# Patient Record
Sex: Female | Born: 1940 | Race: Black or African American | Hispanic: No | Marital: Married | Smoking: Former smoker
Health system: Southern US, Community
[De-identification: ages and names within clinical notes are randomized; demographics above are authoritative.]

## PROBLEM LIST (undated history)

## (undated) DIAGNOSIS — E785 Hyperlipidemia, unspecified: Secondary | ICD-10-CM

## (undated) DIAGNOSIS — C211 Malignant neoplasm of anal canal: Secondary | ICD-10-CM

## (undated) DIAGNOSIS — Z923 Personal history of irradiation: Secondary | ICD-10-CM

## (undated) DIAGNOSIS — I059 Rheumatic mitral valve disease, unspecified: Secondary | ICD-10-CM

## (undated) DIAGNOSIS — Z9289 Personal history of other medical treatment: Secondary | ICD-10-CM

## (undated) DIAGNOSIS — M810 Age-related osteoporosis without current pathological fracture: Secondary | ICD-10-CM

## (undated) DIAGNOSIS — R7309 Other abnormal glucose: Secondary | ICD-10-CM

## (undated) DIAGNOSIS — D649 Anemia, unspecified: Secondary | ICD-10-CM

## (undated) DIAGNOSIS — C541 Malignant neoplasm of endometrium: Secondary | ICD-10-CM

## (undated) DIAGNOSIS — F411 Generalized anxiety disorder: Secondary | ICD-10-CM

## (undated) DIAGNOSIS — I10 Essential (primary) hypertension: Secondary | ICD-10-CM

## (undated) DIAGNOSIS — K739 Chronic hepatitis, unspecified: Secondary | ICD-10-CM

## (undated) HISTORY — PX: TONSILLECTOMY: SUR1361

## (undated) HISTORY — DX: Rheumatic mitral valve disease, unspecified: I05.9

## (undated) HISTORY — DX: Malignant neoplasm of anal canal: C21.1

## (undated) HISTORY — DX: Hyperlipidemia, unspecified: E78.5

## (undated) HISTORY — DX: Anemia, unspecified: D64.9

## (undated) HISTORY — DX: Chronic hepatitis, unspecified: K73.9

## (undated) HISTORY — DX: Other abnormal glucose: R73.09

## (undated) HISTORY — DX: Generalized anxiety disorder: F41.1

## (undated) HISTORY — PX: CHOLECYSTECTOMY: SHX55

## (undated) HISTORY — DX: Malignant neoplasm of endometrium: C54.1

## (undated) HISTORY — DX: Essential (primary) hypertension: I10

## (undated) HISTORY — PX: BREAST LUMPECTOMY: SHX2

## (undated) HISTORY — DX: Personal history of irradiation: Z92.3

---

## 2000-06-10 ENCOUNTER — Emergency Department (HOSPITAL_COMMUNITY): Admission: EM | Admit: 2000-06-10 | Discharge: 2000-06-10 | Payer: Self-pay | Admitting: Emergency Medicine

## 2000-06-10 ENCOUNTER — Encounter: Payer: Self-pay | Admitting: Emergency Medicine

## 2000-09-26 ENCOUNTER — Encounter (INDEPENDENT_AMBULATORY_CARE_PROVIDER_SITE_OTHER): Payer: Self-pay | Admitting: Specialist

## 2000-09-26 ENCOUNTER — Other Ambulatory Visit: Admission: RE | Admit: 2000-09-26 | Discharge: 2000-09-26 | Payer: Self-pay | Admitting: Radiology

## 2000-11-14 ENCOUNTER — Other Ambulatory Visit: Admission: RE | Admit: 2000-11-14 | Discharge: 2000-11-14 | Payer: Self-pay | Admitting: Surgery

## 2001-05-23 ENCOUNTER — Encounter (INDEPENDENT_AMBULATORY_CARE_PROVIDER_SITE_OTHER): Payer: Self-pay | Admitting: Specialist

## 2001-05-23 ENCOUNTER — Other Ambulatory Visit: Admission: RE | Admit: 2001-05-23 | Discharge: 2001-05-23 | Payer: Self-pay | Admitting: Surgery

## 2002-02-03 ENCOUNTER — Encounter: Payer: Self-pay | Admitting: Surgery

## 2002-02-03 ENCOUNTER — Encounter: Admission: RE | Admit: 2002-02-03 | Discharge: 2002-02-03 | Payer: Self-pay | Admitting: Surgery

## 2002-02-04 ENCOUNTER — Ambulatory Visit: Admission: RE | Admit: 2002-02-04 | Discharge: 2002-05-05 | Payer: Self-pay | Admitting: Radiation Oncology

## 2002-03-04 ENCOUNTER — Encounter: Payer: Self-pay | Admitting: Oncology

## 2002-03-04 ENCOUNTER — Ambulatory Visit (HOSPITAL_COMMUNITY): Admission: RE | Admit: 2002-03-04 | Discharge: 2002-03-04 | Payer: Self-pay | Admitting: Oncology

## 2002-03-20 ENCOUNTER — Encounter (HOSPITAL_COMMUNITY): Admission: RE | Admit: 2002-03-20 | Discharge: 2002-06-18 | Payer: Self-pay | Admitting: Oncology

## 2005-01-23 ENCOUNTER — Ambulatory Visit: Payer: Self-pay | Admitting: Pulmonary Disease

## 2005-04-27 ENCOUNTER — Ambulatory Visit: Payer: Self-pay | Admitting: Pulmonary Disease

## 2005-10-30 ENCOUNTER — Ambulatory Visit: Payer: Self-pay | Admitting: Pulmonary Disease

## 2006-01-03 ENCOUNTER — Ambulatory Visit: Payer: Self-pay | Admitting: Pulmonary Disease

## 2006-03-19 ENCOUNTER — Ambulatory Visit: Payer: Self-pay | Admitting: Pulmonary Disease

## 2006-06-19 ENCOUNTER — Ambulatory Visit: Payer: Self-pay | Admitting: Pulmonary Disease

## 2006-07-03 ENCOUNTER — Ambulatory Visit: Payer: Self-pay | Admitting: Gastroenterology

## 2006-08-06 ENCOUNTER — Ambulatory Visit: Payer: Self-pay | Admitting: Gastroenterology

## 2006-12-06 ENCOUNTER — Ambulatory Visit: Payer: Self-pay | Admitting: Pulmonary Disease

## 2006-12-21 ENCOUNTER — Ambulatory Visit: Payer: Self-pay | Admitting: Pulmonary Disease

## 2006-12-21 LAB — CONVERTED CEMR LAB
ALT: 23 units/L (ref 0–40)
AST: 33 units/L (ref 0–37)
Albumin: 3.4 g/dL — ABNORMAL LOW (ref 3.5–5.2)
Alkaline Phosphatase: 57 units/L (ref 39–117)
BUN: 14 mg/dL (ref 6–23)
Basophils Absolute: 0 10*3/uL (ref 0.0–0.1)
Basophils Relative: 0.5 % (ref 0.0–1.0)
Bilirubin, Direct: 0.1 mg/dL (ref 0.0–0.3)
CO2: 31 meq/L (ref 19–32)
Calcium: 9 mg/dL (ref 8.4–10.5)
Chloride: 106 meq/L (ref 96–112)
Cholesterol: 168 mg/dL (ref 0–200)
Creatinine, Ser: 0.8 mg/dL (ref 0.4–1.2)
Eosinophils Absolute: 0.1 10*3/uL (ref 0.0–0.6)
Eosinophils Relative: 3.4 % (ref 0.0–5.0)
GFR calc Af Amer: 93 mL/min
GFR calc non Af Amer: 77 mL/min
Glucose, Bld: 99 mg/dL (ref 70–99)
HCT: 34.6 % — ABNORMAL LOW (ref 36.0–46.0)
HDL: 56.4 mg/dL (ref >39.0)
Hemoglobin: 11.9 g/dL — ABNORMAL LOW (ref 12.0–15.0)
Hgb A1c MFr Bld: 5.6 % (ref 4.6–6.0)
LDL Cholesterol: 97 mg/dL (ref 0–99)
Lymphocytes Relative: 49.1 % — ABNORMAL HIGH (ref 12.0–46.0)
MCHC: 34.3 g/dL (ref 30.0–36.0)
MCV: 90.6 fL (ref 78.0–100.0)
Monocytes Absolute: 0.4 10*3/uL (ref 0.2–0.7)
Monocytes Relative: 10.5 % (ref 3.0–11.0)
Neutro Abs: 1.4 10*3/uL (ref 1.4–7.7)
Neutrophils Relative %: 36.5 % — ABNORMAL LOW (ref 43.0–77.0)
Platelets: 206 10*3/uL (ref 150–400)
Potassium: 3.6 meq/L (ref 3.5–5.1)
RBC: 3.82 M/uL — ABNORMAL LOW (ref 3.87–5.11)
RDW: 11.8 % (ref 11.5–14.6)
Sodium: 141 meq/L (ref 135–145)
TSH: 1.92 microintl units/mL (ref 0.35–5.50)
Total Bilirubin: 1 mg/dL (ref 0.3–1.2)
Total CHOL/HDL Ratio: 3
Total Protein: 7.1 g/dL (ref 6.0–8.3)
Triglycerides: 73 mg/dL (ref 0–149)
VLDL: 15 mg/dL (ref 0–40)
WBC: 3.9 10*3/uL — ABNORMAL LOW (ref 4.5–10.5)

## 2007-07-03 ENCOUNTER — Ambulatory Visit: Payer: Self-pay | Admitting: Pulmonary Disease

## 2007-07-03 LAB — CONVERTED CEMR LAB
ALT: 27 units/L (ref 0–35)
AST: 40 units/L — ABNORMAL HIGH (ref 0–37)
Albumin: 3.7 g/dL (ref 3.5–5.2)
Alkaline Phosphatase: 58 units/L (ref 39–117)
BUN: 11 mg/dL (ref 6–23)
Basophils Absolute: 0 10*3/uL (ref 0.0–0.1)
Basophils Relative: 0.3 % (ref 0.0–1.0)
Bilirubin, Direct: 0.1 mg/dL (ref 0.0–0.3)
CO2: 32 meq/L (ref 19–32)
Calcium: 9.3 mg/dL (ref 8.4–10.5)
Chloride: 107 meq/L (ref 96–112)
Creatinine, Ser: 0.8 mg/dL (ref 0.4–1.2)
Eosinophils Absolute: 0.1 10*3/uL (ref 0.0–0.6)
Eosinophils Relative: 3.2 % (ref 0.0–5.0)
GFR calc Af Amer: 93 mL/min
GFR calc non Af Amer: 77 mL/min
Glucose, Bld: 112 mg/dL — ABNORMAL HIGH (ref 70–99)
HCT: 34.4 % — ABNORMAL LOW (ref 36.0–46.0)
Hemoglobin: 11.8 g/dL — ABNORMAL LOW (ref 12.0–15.0)
Lymphocytes Relative: 35.6 % (ref 12.0–46.0)
MCHC: 34.4 g/dL (ref 30.0–36.0)
MCV: 89.9 fL (ref 78.0–100.0)
Monocytes Absolute: 0.3 10*3/uL (ref 0.2–0.7)
Monocytes Relative: 9.7 % (ref 3.0–11.0)
Neutro Abs: 1.7 10*3/uL (ref 1.4–7.7)
Neutrophils Relative %: 51.2 % (ref 43.0–77.0)
Platelets: 196 10*3/uL (ref 150–400)
Potassium: 4.6 meq/L (ref 3.5–5.1)
RBC: 3.82 M/uL — ABNORMAL LOW (ref 3.87–5.11)
RDW: 12.1 % (ref 11.5–14.6)
Sodium: 144 meq/L (ref 135–145)
Total Bilirubin: 0.7 mg/dL (ref 0.3–1.2)
Total Protein: 7.6 g/dL (ref 6.0–8.3)
WBC: 3.2 10*3/uL — ABNORMAL LOW (ref 4.5–10.5)

## 2007-10-23 DIAGNOSIS — F411 Generalized anxiety disorder: Secondary | ICD-10-CM | POA: Insufficient documentation

## 2007-10-23 DIAGNOSIS — I10 Essential (primary) hypertension: Secondary | ICD-10-CM | POA: Insufficient documentation

## 2007-10-23 DIAGNOSIS — I059 Rheumatic mitral valve disease, unspecified: Secondary | ICD-10-CM | POA: Insufficient documentation

## 2007-10-23 DIAGNOSIS — R002 Palpitations: Secondary | ICD-10-CM

## 2007-12-30 ENCOUNTER — Ambulatory Visit: Payer: Self-pay | Admitting: Pulmonary Disease

## 2007-12-30 DIAGNOSIS — M949 Disorder of cartilage, unspecified: Secondary | ICD-10-CM

## 2007-12-30 DIAGNOSIS — R7309 Other abnormal glucose: Secondary | ICD-10-CM | POA: Insufficient documentation

## 2007-12-30 DIAGNOSIS — M899 Disorder of bone, unspecified: Secondary | ICD-10-CM | POA: Insufficient documentation

## 2007-12-30 DIAGNOSIS — E785 Hyperlipidemia, unspecified: Secondary | ICD-10-CM

## 2007-12-30 DIAGNOSIS — K739 Chronic hepatitis, unspecified: Secondary | ICD-10-CM

## 2008-01-19 DIAGNOSIS — C211 Malignant neoplasm of anal canal: Secondary | ICD-10-CM

## 2008-01-19 LAB — CONVERTED CEMR LAB
ALT: 27 units/L (ref 0–35)
AST: 43 units/L — ABNORMAL HIGH (ref 0–37)
Albumin: 3.9 g/dL (ref 3.5–5.2)
Alkaline Phosphatase: 60 units/L (ref 39–117)
BUN: 11 mg/dL (ref 6–23)
Basophils Absolute: 0 10*3/uL (ref 0.0–0.1)
Basophils Relative: 0.4 % (ref 0.0–1.0)
Bilirubin, Direct: 0.1 mg/dL (ref 0.0–0.3)
CO2: 32 meq/L (ref 19–32)
Calcium: 9.5 mg/dL (ref 8.4–10.5)
Chloride: 106 meq/L (ref 96–112)
Cholesterol: 210 mg/dL (ref 0–200)
Creatinine, Ser: 0.9 mg/dL (ref 0.4–1.2)
Direct LDL: 105.4 mg/dL
Eosinophils Absolute: 0.1 10*3/uL (ref 0.0–0.6)
Eosinophils Relative: 2.8 % (ref 0.0–5.0)
GFR calc Af Amer: 81 mL/min
GFR calc non Af Amer: 67 mL/min
Glucose, Bld: 122 mg/dL — ABNORMAL HIGH (ref 70–99)
HCT: 38.9 % (ref 36.0–46.0)
HDL: 61.6 mg/dL (ref >39.0)
Hemoglobin: 12.7 g/dL (ref 12.0–15.0)
Hgb A1c MFr Bld: 5.6 % (ref 4.6–6.0)
Lymphocytes Relative: 32.7 % (ref 12.0–46.0)
MCHC: 32.7 g/dL (ref 30.0–36.0)
MCV: 91.9 fL (ref 78.0–100.0)
Monocytes Absolute: 0.4 10*3/uL (ref 0.2–0.7)
Monocytes Relative: 9.8 % (ref 3.0–11.0)
Neutro Abs: 1.9 10*3/uL (ref 1.4–7.7)
Neutrophils Relative %: 54.3 % (ref 43.0–77.0)
Platelets: 227 10*3/uL (ref 150–400)
Potassium: 4.4 meq/L (ref 3.5–5.1)
RBC: 4.23 M/uL (ref 3.87–5.11)
RDW: 11.8 % (ref 11.5–14.6)
Sodium: 142 meq/L (ref 135–145)
TSH: 1.61 microintl units/mL (ref 0.35–5.50)
Total Bilirubin: 0.8 mg/dL (ref 0.3–1.2)
Total CHOL/HDL Ratio: 3.4
Total Protein: 8.2 g/dL (ref 6.0–8.3)
Triglycerides: 148 mg/dL (ref 0–149)
VLDL: 30 mg/dL (ref 0–40)
WBC: 3.6 10*3/uL — ABNORMAL LOW (ref 4.5–10.5)

## 2008-01-22 ENCOUNTER — Telehealth: Payer: Self-pay | Admitting: Pulmonary Disease

## 2008-02-03 ENCOUNTER — Encounter: Payer: Self-pay | Admitting: Pulmonary Disease

## 2008-07-01 ENCOUNTER — Ambulatory Visit: Payer: Self-pay | Admitting: Pulmonary Disease

## 2008-07-06 LAB — CONVERTED CEMR LAB
ALT: 29 units/L (ref 0–35)
AST: 42 units/L — ABNORMAL HIGH (ref 0–37)
Albumin: 3.8 g/dL (ref 3.5–5.2)
Alkaline Phosphatase: 55 units/L (ref 39–117)
BUN: 15 mg/dL (ref 6–23)
Basophils Absolute: 0 10*3/uL (ref 0.0–0.1)
Basophils Relative: 0.6 % (ref 0.0–3.0)
Bilirubin, Direct: 0.2 mg/dL (ref 0.0–0.3)
CO2: 29 meq/L (ref 19–32)
Calcium: 9.1 mg/dL (ref 8.4–10.5)
Chloride: 113 meq/L — ABNORMAL HIGH (ref 96–112)
Cholesterol: 169 mg/dL (ref 0–200)
Creatinine, Ser: 0.8 mg/dL (ref 0.4–1.2)
Eosinophils Absolute: 0.1 10*3/uL (ref 0.0–0.7)
Eosinophils Relative: 2.2 % (ref 0.0–5.0)
GFR calc Af Amer: 92 mL/min
GFR calc non Af Amer: 76 mL/min
Glucose, Bld: 105 mg/dL — ABNORMAL HIGH (ref 70–99)
HCT: 35.8 % — ABNORMAL LOW (ref 36.0–46.0)
HDL: 67.5 mg/dL (ref >39.0)
Hemoglobin: 12.4 g/dL (ref 12.0–15.0)
LDL Cholesterol: 79 mg/dL (ref 0–99)
Lymphocytes Relative: 33.9 % (ref 12.0–46.0)
MCHC: 34.8 g/dL (ref 30.0–36.0)
MCV: 90.1 fL (ref 78.0–100.0)
Monocytes Absolute: 0.4 10*3/uL (ref 0.1–1.0)
Monocytes Relative: 9.9 % (ref 3.0–12.0)
Neutro Abs: 2.3 10*3/uL (ref 1.4–7.7)
Neutrophils Relative %: 53.4 % (ref 43.0–77.0)
Platelets: 206 10*3/uL (ref 150–400)
Potassium: 4 meq/L (ref 3.5–5.1)
RBC: 3.97 M/uL (ref 3.87–5.11)
RDW: 12.2 % (ref 11.5–14.6)
Sodium: 144 meq/L (ref 135–145)
TSH: 1.01 microintl units/mL (ref 0.35–5.50)
Total Bilirubin: 0.7 mg/dL (ref 0.3–1.2)
Total CHOL/HDL Ratio: 2.5
Total Protein: 7.9 g/dL (ref 6.0–8.3)
Triglycerides: 115 mg/dL (ref 0–149)
VLDL: 23 mg/dL (ref 0–40)
WBC: 4.2 10*3/uL — ABNORMAL LOW (ref 4.5–10.5)

## 2008-07-13 ENCOUNTER — Encounter: Payer: Self-pay | Admitting: Pulmonary Disease

## 2008-12-29 ENCOUNTER — Ambulatory Visit: Payer: Self-pay | Admitting: Pulmonary Disease

## 2008-12-31 DIAGNOSIS — D649 Anemia, unspecified: Secondary | ICD-10-CM

## 2008-12-31 LAB — CONVERTED CEMR LAB
ALT: 24 units/L (ref 0–35)
BUN: 9 mg/dL (ref 6–23)
Basophils Absolute: 0 10*3/uL (ref 0.0–0.1)
Bilirubin, Direct: 0.1 mg/dL (ref 0.0–0.3)
CO2: 30 meq/L (ref 19–32)
Chloride: 107 meq/L (ref 96–112)
Cholesterol: 175 mg/dL (ref 0–200)
Creatinine, Ser: 0.8 mg/dL (ref 0.4–1.2)
Eosinophils Absolute: 0.1 10*3/uL (ref 0.0–0.7)
Eosinophils Relative: 2.3 % (ref 0.0–5.0)
Glucose, Bld: 131 mg/dL — ABNORMAL HIGH (ref 70–99)
HCT: 35.8 % — ABNORMAL LOW (ref 36.0–46.0)
Hgb A1c MFr Bld: 5.6 % (ref 4.6–6.5)
LDL Cholesterol: 102 mg/dL — ABNORMAL HIGH (ref 0–99)
Lymphs Abs: 0.9 10*3/uL (ref 0.7–4.0)
MCHC: 33.5 g/dL (ref 30.0–36.0)
MCV: 91.5 fL (ref 78.0–100.0)
Monocytes Absolute: 0.3 10*3/uL (ref 0.1–1.0)
Neutrophils Relative %: 55.2 % (ref 43.0–77.0)
Platelets: 189 10*3/uL (ref 150.0–400.0)
Potassium: 4 meq/L (ref 3.5–5.1)
RDW: 12.1 % (ref 11.5–14.6)
Sed Rate: 12 mm/hr (ref 0–22)
TSH: 1.67 microintl units/mL (ref 0.35–5.50)
Total Bilirubin: 0.8 mg/dL (ref 0.3–1.2)
Triglycerides: 91 mg/dL (ref 0.0–149.0)

## 2009-01-20 ENCOUNTER — Ambulatory Visit: Payer: Self-pay | Admitting: Pulmonary Disease

## 2009-01-20 LAB — CONVERTED CEMR LAB
Fecal Occult Blood: NEGATIVE
OCCULT 2: NEGATIVE

## 2009-06-30 ENCOUNTER — Ambulatory Visit: Payer: Self-pay | Admitting: Pulmonary Disease

## 2009-07-02 LAB — CONVERTED CEMR LAB
Albumin: 3.9 g/dL (ref 3.5–5.2)
Basophils Absolute: 0 10*3/uL (ref 0.0–0.1)
Basophils Relative: 0.7 % (ref 0.0–3.0)
CO2: 30 meq/L (ref 19–32)
Chloride: 106 meq/L (ref 96–112)
Creatinine, Ser: 0.8 mg/dL (ref 0.4–1.2)
Eosinophils Absolute: 0.1 10*3/uL (ref 0.0–0.7)
Glucose, Bld: 113 mg/dL — ABNORMAL HIGH (ref 70–99)
Hemoglobin: 13.2 g/dL (ref 12.0–15.0)
MCHC: 34.4 g/dL (ref 30.0–36.0)
MCV: 91.7 fL (ref 78.0–100.0)
Monocytes Absolute: 0.4 10*3/uL (ref 0.1–1.0)
Neutro Abs: 1.9 10*3/uL (ref 1.4–7.7)
RBC: 4.2 M/uL (ref 3.87–5.11)
RDW: 12.2 % (ref 11.5–14.6)
Total Protein: 8.9 g/dL — ABNORMAL HIGH (ref 6.0–8.3)

## 2009-07-14 ENCOUNTER — Encounter: Payer: Self-pay | Admitting: Pulmonary Disease

## 2009-09-18 ENCOUNTER — Encounter: Payer: Self-pay | Admitting: Pulmonary Disease

## 2009-09-27 ENCOUNTER — Telehealth (INDEPENDENT_AMBULATORY_CARE_PROVIDER_SITE_OTHER): Payer: Self-pay | Admitting: *Deleted

## 2009-12-27 ENCOUNTER — Ambulatory Visit: Payer: Self-pay | Admitting: Pulmonary Disease

## 2010-01-02 LAB — CONVERTED CEMR LAB
Albumin: 3.9 g/dL (ref 3.5–5.2)
Alkaline Phosphatase: 48 units/L (ref 39–117)
BUN: 12 mg/dL (ref 6–23)
Basophils Absolute: 0 10*3/uL (ref 0.0–0.1)
CO2: 33 meq/L — ABNORMAL HIGH (ref 19–32)
Calcium: 9.3 mg/dL (ref 8.4–10.5)
Cholesterol: 195 mg/dL (ref 0–200)
Creatinine, Ser: 0.8 mg/dL (ref 0.4–1.2)
Glucose, Bld: 116 mg/dL — ABNORMAL HIGH (ref 70–99)
HDL: 77.3 mg/dL (ref >39.00)
Hemoglobin: 12.6 g/dL (ref 12.0–15.0)
Lymphocytes Relative: 36.9 % (ref 12.0–46.0)
Monocytes Relative: 10.1 % (ref 3.0–12.0)
Neutro Abs: 1.7 10*3/uL (ref 1.4–7.7)
RBC: 4.09 M/uL (ref 3.87–5.11)
RDW: 12.1 % (ref 11.5–14.6)
Total Protein: 8 g/dL (ref 6.0–8.3)
Triglycerides: 116 mg/dL (ref 0.0–149.0)
WBC: 3.4 10*3/uL — ABNORMAL LOW (ref 4.5–10.5)

## 2010-03-17 ENCOUNTER — Telehealth: Payer: Self-pay | Admitting: Pulmonary Disease

## 2010-03-17 ENCOUNTER — Encounter: Payer: Self-pay | Admitting: Pulmonary Disease

## 2010-06-21 ENCOUNTER — Encounter: Payer: Self-pay | Admitting: Pulmonary Disease

## 2010-06-27 ENCOUNTER — Ambulatory Visit: Payer: Self-pay | Admitting: Pulmonary Disease

## 2010-07-21 ENCOUNTER — Encounter: Payer: Self-pay | Admitting: Pulmonary Disease

## 2010-07-26 ENCOUNTER — Encounter: Payer: Self-pay | Admitting: Pulmonary Disease

## 2010-07-29 ENCOUNTER — Encounter: Payer: Self-pay | Admitting: Pulmonary Disease

## 2010-08-13 ENCOUNTER — Encounter: Payer: Self-pay | Admitting: Pulmonary Disease

## 2010-11-15 NOTE — Assessment & Plan Note (Signed)
Summary: 6 months/apc   CC:  6 month ROV & review of mult medical problems....  History of Present Illness: 70 y/o BF here for a follow up visit... she has mult med problems as noted below...     ~  Sep10:  25mo follow up- doing well... she's been walking, dieting, & has lost 5# to 191#...  BP has been OK at home= 130-140's/ 80's... feeling well and denies abd pain, n/v, change in bowel habits, etc...    ~  December 27, 2009:  she's had a good 25mo- no new complaints or concerns... retired now & exercising by walking etc... 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   Current Problems:   HYPERTENSION (ICD-401.9) - controlled on TENORMIN 50mg /d, LASIX 20mg /d & K20/d... BP=132/76 today, & not checking regularly at home... she denies HA, fatigue, visual changes, CP, palipit, dizziness, syncope, dyspnea, edema, etc...  ? of MITRAL VALVE PROLAPSE (ICD-424.0) - asymptomatic: no CP or palpit recently...  ~  2DEcho 1/96 showed WNL- no MVP seen, norm LVF w/ EF=55-60%...  PALPITATIONS (ICD-785.1) - no recent symptoms (controlled w/ meds)...  HYPERLIPIDEMIA, BORDERLINE (ICD-272.4) - on diet alone + Fish Oil...  ~  FLP 3/08 showed TChol 168, TG 73, HDL 56, LDL 97... weight 193#, continue diet Rx...  ~  FLP 3/09 showed TChol 210, TG 148, HDL 62, LDL 105... weight 196#, needs better diet efforts.  ~  FLP 9/09 showed TChol 169, TG 115, HDL 68, LDL 79... wt=195#.Marland KitchenMarland Kitchen contin diet efforts.  ~  FLP 3/10 showed TChol 175, TG 91, HDL 55, LDL 102... wt=196#... rec- wt loss.  ~  FLP 3/11 showed TChol 195, TG 116, HDL 77, LDL 95... wt=191#  Hx of OTHER ABNORMAL GLUCOSE (ICD-790.29) - borderline blood sugars in the past- diet Rx...  ~  labs 3/08 showed BS=99, HgA1c=5.6  ~  labs 3/09 showed BS=122, HgA1c=5.6.Marland KitchenMarland Kitchen rec diet + exercise program...  ~  labs 9/09 showed BS= 105  ~  labs 3/10 showed BS=  131, HgA1c= 5.6.Marland KitchenMarland Kitchen rec- contin diet efforts, get wt down.  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  ~  last colonoscopy 10/07 by DrPatterson was normal w/o rectal lesions seen, & otherw neg too... f/u planned 72yrs...  ~  3/10: pt reports no blood seen, stool cards= neg...  ~  3/11:  remains stable & asymptomatic...  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 she had a needle bx of liver in 1998 showing granulomatous hepatitis & ACE was 74 (8-52) and she was treated w/ Pred w/ normalization of her LFT's (and ACE level)... we were unable to wean to Qod regimen due to incr LFT's again, but stable on daily Rx- now down to 2.5mg /d.  ~  prev LFT's normal 3/08 on Pred 5mg /d... continue same med...  ~  labs 3/09 w/ SGOT 43 (0-37), all others WNL.Marland Kitchen. continue 5mg /d...  ~  labs 9/09 showed SGOT= 42, others normal... rec- decr 5mg  to 1/2 tab daily...  ~  labs 3/10 showed SGOT= 38, others WNL.Marland Kitchen. rec- contin same (2.5mg /d)  ~  labs 9/10 showed SGOT= 40, SGPT= 28  ~  labs 3/11 showed SGOT= 39, SGPT= 25... rec> contin Pred 2.5mg /d  OSTEOPENIA (ICD-733.90) - on EVISTA 60mg /d, ca++, vits, & Vit D 50000 u weekly per DrDickstein...  ~  last BMD @ [facility] 9/05 showed TScores -0.7 to -1.1  ~  pt reports BMD and Vit D levels by DrDickstein...  ANXIETY (ICD-300.00) - on TRANXENE 7.5mg  as needed.  ANEMIA (ICD-285.9) - prev hx of anemia... stabilized in the 11-12 range...   ~  labs 3/10 showed Hg= 12.0  ~  labs 9/10 showed Hg= 13.2  ~  labs 3/11 showed Hg= 12.6  Health Maintenance -   ~  GYN/ PAP per DrDickstein... pt reports last seen 9/10  ~  Mammograms at [facility]... done 2/10 & reported neg.  ~  Colonoscopy last performed  7/07 by DrPatterson (see above).  ~  Immunizations: she gets yearly Flu shots (given 12/10);  had TETANUS shot  ~2007-8 she says;  given PNEUMOVAX today 3/11- age69.   Allergies (verified): No Known Drug Allergies  Past History:  Past Medical History:  HYPERTENSION (ICD-401.9) MITRAL VALVE PROLAPSE (ICD-424.0) PALPITATIONS (ICD-785.1) HYPERLIPIDEMIA, BORDERLINE (ICD-272.4) Hx of OTHER ABNORMAL GLUCOSE (ICD-790.29) Hx of MALIGNANT NEOPLASM OF ANAL CANAL (ICD-154.2) UNSPECIFIED CHRONIC HEPATITIS (ICD-571.40) OSTEOPENIA (ICD-733.90) ANXIETY (ICD-300.00) Hx of ANEMIA (ICD-285.9)  Past Surgical History: S/P cholecystectomy in 1997 by DrBallen  Family History: Reviewed history from 06/30/2009 and no changes required. Father died  ~65 w/ stroke, MVA Mother died age 25 w/ Alzheimer's disease 12 Siblings: 3 Brothers drowned in the 49's 1 Brother died w/ prostate cancer 1 Sister died w/ Alzheimer's dis 6 Sibs alive & well  Social History: Reviewed history from 06/30/2009 and no changes required. Married, husb= Joe [place], 28yrs. 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    Vital Signs:  Patient profile:   70 year old female Height:      65 inches Weight:      191 pounds BMI:     31.90 O2 Sat:       94 % on Room air Temp:     97.7 degrees F oral Pulse rate:   78 / minute BP sitting:   132 / 76  (left arm) Cuff size:   regular  Vitals Entered By: Randell Loop CMA (December 27, 2009 9:41 AM)  O2 Sat at Rest %:  94 O2 Flow:  Room air CC: 6 month ROV & review of mult medical problems... Is Patient Diabetic? No Pain Assessment Patient in pain? no      Comments MEDS UPDATED TODAY   Physical Exam  Additional Exam:  WD, Sl overweight, 69 y/o BF in NAD... GENERAL:  Alert & oriented; pleasant & cooperative... HEENT:  [place]/AT, EOM-wnl, PERRLA, EACs-clear, TMs-wnl, NOSE-clear, THROAT-clear & wnl. NECK:  Supple w/ fairROM; no JVD; normal carotid impulses w/o bruits; no thyromegaly or nodules palpated; no lymphadenopathy. CHEST:  Clear to P &  A; without wheezes/ rales/ or rhonchi. HEART:  Regular Rhythm; without murmurs/ rubs/ or gallops. ABDOMEN:  Soft & nontender; normal bowel sounds; no organomegaly or masses detected. EXT: without deformities or arthritic changes; no varicose veins/ venous insuffic/ or edema. NEURO:  CN's intact; motor testing normal; sensory testing normal; gait normal & balance OK. DERM:  No lesions noted; no rash etc...    MISC. Report  Procedure date:  12/27/2009  Findings:      FASTING labs reviewed & pt notified by phone tree...  SN    Impression & Recommendations:  Problem # 1:  HYPERTENSION (ICD-401.9) Controlled-  same meds. Her updated medication list for this problem includes:    Tenormin 50 Mg Tabs (Atenolol) .Marland Kitchen... Take 1 tablet by mouth once a day    Lasix 20 Mg Tabs (Furosemide) .Marland Kitchen... Take 1 tablet by mouth once a day  Orders: TLB-Lipid Panel (80061-LIPID) TLB-BMP (Basic Metabolic Panel-BMET) (80048-METABOL) TLB-Hepatic/Liver Function Pnl (80076-HEPATIC) TLB-CBC Platelet - w/Differential (85025-CBCD) TLB-TSH (Thyroid Stimulating Hormone) (84443-TSH) TLB-A1C / Hgb A1C (Glycohemoglobin) (83036-A1C)  Problem # 2:  HYPERLIPIDEMIA,  BORDERLINE (ICD-272.4) Improved-  stable on diet + fish oil...  Problem # 3:  Hx of OTHER ABNORMAL GLUCOSE (ICD-790.29) She remains under control w/ diet + exerciose... get wt down!!!  Problem # 4:  Hx of MALIGNANT NEOPLASM OF ANAL CANAL (ICD-154.2) Followed by DrStreck, Duwayne Heck, DrDickstein... no known recurrence- doing satis.  Problem # 5:  UNSPECIFIED CHRONIC HEPATITIS (ICD-571.40) Hx granulomatous hepatitis... stable on Pred 2.5mg /d... keep same.  Problem # 6:  OSTEOPENIA (ICD-733.90) Stable on Evista, Calcium, Vits, Vit D 1000 u daily. Her updated medication list for this problem includes:    Evista 60 Mg Tabs (Raloxifene hcl) .Marland Kitchen... Take 1 tablet by mouth once a day as directed by drdickstein.  Problem # 7:  OTHER MEDICAL PROBLEMS AS NOTED>>> OK PNEUMOVAX today...  Complete Medication List: 1)  Tenormin 50 Mg Tabs (Atenolol) .... Take 1 tablet by mouth once a day 2)  Lasix 20 Mg Tabs (Furosemide) .... Take 1 tablet by mouth once a day 3)  Klor-con M20 20 Meq Tbcr (Potassium chloride crys cr) .... Take 1 tablet by mouth once a day 4)  Fish Oil 1000 Mg Caps (Omega-3 fatty acids) .Marland Kitchen.. 1 cap by mouth once daily.Marland KitchenMarland Kitchen 5)  Prednisone 5 Mg Tabs (Prednisone) .... Take as directed 6)  Evista 60 Mg Tabs (Raloxifene hcl) .... Take 1 tablet by mouth once a day as directed by drdickstein. 7)  Tums 500 Mg Chew (Calcium carbonate antacid) .... Take 2 two times a day 8)  Centrum Silver Tabs (Multiple vitamins-minerals) .... Take 1 tablet by mouth once a day 9)  Vitamin D 1000 Unit Tabs (Cholecalciferol) .... Take 1 tablet by mouth once a day 10)  Garlic Oil 1000 Mg Caps (Garlic) .... Take one capsule by mouth once daily 11)  Clorazepate Dipotassium 7.5 Mg Tabs (Clorazepate dipotassium) .... Take 1/2 to 1 tab by mouth three times a day as needed for anxiety...  Other Orders: Pneumococcal Vaccine (04540) Admin 1st Vaccine (98119)  Patient Instructions: 1)  Today we updated your med list- see  below.... 2)  Continue your current medications the same... 3)  Today we did your follow up FASTING blood work... please call the "phone tree" in a few days for your lab results.Marland KitchenMarland Kitchen 4)  We also gave your the PNEUMONIA vaccine today (based on current recommendations this is the only one you will need).Marland Kitchen 5)  Keep up the great  exercise program and diet Rx... 6)  Call for any questions.Marland KitchenMarland Kitchen 7)  Please schedule a follow-up appointment in 6 months.   Immunizations Administered:  Pneumonia Vaccine:    Vaccine Type: Pneumovax    Site: left deltoid    Mfr: Merck    Dose: 0.5 ml    Route: IM    Given by: Randell Loop CMA    Exp. Date: 05/30/2011    Lot #: [id number]    VIS given: 05/13/96 version given December 27, 2009.

## 2010-11-15 NOTE — Medication Information (Signed)
Summary: Furosemide,Atenolol,Klor-Con/[employer]  Furosemide,Atenolol,Klor-Con/[employer]   Imported By: Sherian Rein 06/27/2010 08:18:52  _____________________________________________________________________  External Attachment:    Type:   Image     Comment:   External Document

## 2010-11-15 NOTE — Assessment & Plan Note (Signed)
Summary: rov 6 months///kp   CC:  6 month ROV & review of mult medical problems....  History of Present Illness: 70 y/o Holt here for a follow up visit... she has mult med problems as noted below...     ~  Sep10:  60mo follow up- doing well... she's been walking, dieting, & has lost 5# to 191#...  BP has been OK at home= 130-140's/ 80's... feeling well and denies abd pain, n/v, change in bowel habits, etc...    ~  December 27, 2009:  she's had a good 60mo- no new complaints or concerns... retired now & exercising by walking etc... 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   ~  June 27, 2010:  60mo ROV- doing well w/o new complaints or concerns... BP stable on Aten+Lasix;  denies CP, palpit, SOB, edema, etc;  Chol & Sugars have been OK on diet Rx;  weight stable in 190's;  GI reviewed & stable> on Pred 2.5mg /d w/ LFTs checked;  OK 2011 Flu vaccine today & she inquired about taking a B12 supplement in addition to her MVI & Vit D...   Current Problems:   HYPERTENSION (ICD-401.9) - controlled on TENORMIN 50mg /d, LASIX 20mg /d & K20/d... BP=138/70 today, & not checking regularly at home... she denies HA, fatigue, visual changes, CP, palipit, dizziness, syncope, dyspnea, edema, etc...  ? of MITRAL VALVE PROLAPSE (ICD-424.0) - asymptomatic: no CP or palpit recently...  ~  2DEcho 1/96 showed WNL- no MVP seen, norm LVF w/ EF=55-60%...  PALPITATIONS (ICD-785.1) - no recent symptoms (controlled w/ meds)...  HYPERLIPIDEMIA, BORDERLINE (ICD-272.4) - on diet alone + Fish Oil...  ~  FLP 3/08 showed TChol 168, TG 73, HDL 56, LDL [place]... weight 193#, continue diet Rx...  ~  FLP 3/09 showed TChol 210, TG 148, HDL 62, LDL 105... weight 196#, needs better diet efforts.  ~  FLP 9/09 showed TChol 169, TG 115, HDL 68, LDL 79... wt=195#.Marland KitchenMarland Kitchen contin diet efforts.  ~  FLP 3/10 showed TChol 175, TG 91,  HDL 55, LDL 102... wt=196#... rec- wt loss.  ~  FLP 3/11 showed TChol 195, TG 116, HDL 77, LDL 95... wt=191#  Hx of OTHER ABNORMAL GLUCOSE (ICD-790.29) - borderline blood sugars in the past- diet Rx...  ~  labs 3/08 showed BS=99, HgA1c=5.6  ~  labs 3/09 showed BS=122, HgA1c=5.6.Marland KitchenMarland Kitchen rec diet + exercise program...  ~  labs 9/09 showed BS= 105  ~  labs 3/10 showed BS= 131, HgA1c= 5.6.Marland KitchenMarland Kitchen rec- contin diet efforts, get wt down.  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  ~  last colonoscopy 10/07 by DrPatterson was normal w/o rectal lesions seen, & otherw neg too... f/u planned 71yrs...  ~  3/10: pt reports no blood seen, stool cards= neg...  ~  3/11:  remains stable & asymptomatic...  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 she had a needle bx of liver in 1998 showing granulomatous hepatitis & ACE was 74 (8-52) and she was treated w/ Pred w/ normalization of her LFT's (and ACE level)... we were unable to wean  to Qod regimen due to incr LFT's again, but stable on daily Rx- now down to 2.5mg /d.  ~  prev LFT's normal 3/08 on Pred 5mg /d... continue same med...  ~  labs 3/09 w/ SGOT 43 (0-37), all others WNL.Marland Kitchen. continue 5mg /d...  ~  labs 9/09 showed SGOT= 42, others normal... rec- decr 5mg  to 1/2 tab daily...  ~  labs 3/10 showed SGOT= 38, others WNL.Marland Kitchen. rec- contin same (2.5mg /d)  ~  labs 9/10 showed SGOT= 40, SGPT= 28  ~  labs 3/11 showed SGOT= 39, SGPT= 25... rec> contin Pred 2.5mg /d  OSTEOPENIA (ICD-733.90) - on EVISTA 60mg /d, ca++, vits, & Vit D 2000 u daily per DrDickstein...  ~  last BMD @ [facility] 9/05 showed TScores -0.7 to -1.1  ~  pt reports BMD and Vit D levels by DrDickstein & decr from 50K per wk to 2000u  per day.  ANXIETY (ICD-300.00) - on TRANXENE 7.5mg  as needed.  ANEMIA (ICD-285.9) - prev hx of anemia... stabilized in the 11-12 range...   ~  labs 3/10 showed Hg= 12.0  ~  labs 9/10 showed Hg= 13.2  ~  labs 3/11 showed Hg= 12.6  Health Maintenance -   ~  GYN/ PAP per DrDickstein... pt reports last seen 9/10  ~  Mammograms at [facility]... done 2/10 & reported neg.  ~  Colonoscopy last performed 7/07 by DrPatterson (see above).  ~  Immunizations: she gets yearly Flu shots (given 9/11);  had TETANUS shot  ~2007-8 she says;  given PNEUMOVAX today 3/11- age69.   Preventive Screening-Counseling & Management  Alcohol-Tobacco     Smoking Status: never  Allergies (verified): No Known Drug Allergies  Comments:  Nurse/Medical Assistant: The patient's medications and allergies were reviewed with the patient and were updated in the Medication and Allergy Lists.  Past History:  Past Medical History: HYPERTENSION (ICD-401.9) MITRAL VALVE PROLAPSE (ICD-424.0) PALPITATIONS (ICD-785.1) HYPERLIPIDEMIA, BORDERLINE (ICD-272.4) Hx of OTHER ABNORMAL GLUCOSE (ICD-790.29) Hx of MALIGNANT NEOPLASM OF ANAL CANAL (ICD-154.2) UNSPECIFIED CHRONIC HEPATITIS (ICD-571.40) OSTEOPENIA (ICD-733.90) ANXIETY (ICD-300.00) Hx of ANEMIA (ICD-285.9)  Past Surgical History: S/P cholecystectomy in 1997 by DrBallen  Family History: Reviewed history from 06/30/2009 and no changes required. Father died  ~75 w/ stroke, MVA Mother died age 42 w/ Alzheimer's disease 12 Siblings: 3 Brothers drowned in the 26's 1 Brother died w/ prostate cancer 1 Sister died w/ Alzheimer's dis 6 Sibs alive & well  Social History: Reviewed history from 12/27/2009 and no changes required. Married, husb= Joe [place], 22yrs. 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         Her CC is some right shoulder pain- uses Ibuprofen & heat, offered Ortho eval when she is ready.   Vital Signs:  Patient profile:   70 year old female Height:      65 inches Weight:      193.13 pounds O2 Sat:      96 % on Room air Temp:  98.7 degrees F oral Pulse rate:   91 / minute BP sitting:   138 / 70  (left arm) Cuff size:   regular  Vitals Entered By: Randell Loop CMA (June 27, 2010 8:48 AM)  O2 Sat at Rest %:  96 O2 Flow:  Room air CC: 6 month ROV & review of mult medical problems... Is Patient Diabetic? No Pain Assessment Patient in pain? no      Comments no changes in meds today   Physical Exam  Additional Exam:  WD, Sl overweight, 69 y/o Holt in NAD... GENERAL:  Alert & oriented; pleasant & cooperative... HEENT:  [place]/AT, EOM-wnl, PERRLA, EACs-clear, TMs-wnl, NOSE-clear, THROAT-clear & wnl. NECK:  Supple w/ fairROM; no JVD; normal carotid impulses w/o bruits; no thyromegaly or nodules palpated; no lymphadenopathy. CHEST:  Clear to P & A; without wheezes/ rales/ or rhonchi. HEART:  Regular Rhythm; without murmurs/ rubs/ or gallops. ABDOMEN:  Soft & nontender; normal bowel sounds; no organomegaly or masses detected. EXT: without deformities or arthritic changes; no varicose veins/ venous insuffic/ or edema. She has good ROM right shoulder... NEURO:  CN's intact; motor testing normal; sensory testing normal; gait normal & balance  OK. DERM:  No lesions noted; no rash etc...    MISC. Report  Procedure date:  06/27/2010  Findings:      DATA REVIEWED:  ~  Prev EMR notes...  ~  Prev lab data reviewed w/ pt...   Impression & Recommendations:  Problem # 1:  HYPERTENSION (ICD-401.9) Controlled>  same meds. Her updated medication list for this problem includes:    Tenormin 50 Mg Tabs (Atenolol) .Marland Kitchen... Take 1 tablet by mouth once a day    Lasix 20 Mg Tabs (Furosemide) .Marland Kitchen... Take 1 tablet by mouth once a day  Problem # 2:  MITRAL VALVE PROLAPSE (ICD-424.0) Stable w/o CP, palpit, etc... Her updated medication list for this problem includes:    Tenormin 50 Mg Tabs (Atenolol) .Marland Kitchen... Take 1 tablet by mouth once a day  Problem # 3:  HYPERLIPIDEMIA, BORDERLINE (ICD-272.4) Stable on diet alone>  we reviewed prev labs & decided to go w/ yearly f/u blood work [facility]...  Problem # 4:  Hx of OTHER ABNORMAL GLUCOSE (ICD-790.29) Stable on diet alone>  we reviewed prev labs & decided to go w/ yearly f/u blood work [facility]...  Problem # 5:  Hx of MALIGNANT NEOPLASM OF ANAL CANAL (ICD-154.2) No symptoms and f/u colon due Fall of 2012...  Problem # 6:  UNSPECIFIED CHRONIC HEPATITIS (ICD-571.40) Hx granulomatous hepatitis well controlled on low dose Pred 2.5mg /d... continue same for now.  Problem # 7:  OSTEOPENIA (ICD-733.90) Followed by GYUN= DrDickstein & she reports decr Vit D to 2000 u daily... Her updated medication list for this problem includes:    Evista 60 Mg Tabs (Raloxifene hcl) .Marland Kitchen... Take 1 tablet by mouth once a day as directed by drdickstein.  Problem # 8:  ANXIETY (ICD-300.00) Stable on Prn Rx... Her updated medication list for this problem includes:    Clorazepate Dipotassium 7.5 Mg Tabs (Clorazepate dipotassium) .Marland Kitchen... Take 1/2 to 1 tab by mouth three times a day as needed for anxiety...  Complete Medication List: 1)  Tenormin 50 Mg Tabs (Atenolol) .... Take 1 tablet by mouth once a day 2)  Lasix 20 Mg  Tabs (Furosemide) .... Take 1 tablet by mouth once a day 3)  Klor-con M20 20 Meq Tbcr (Potassium chloride crys cr) .... Take 1 tablet by mouth once a day 4)  Fish Oil 1000 Mg Caps (Omega-3 fatty acids) .Marland Kitchen.. 1 cap by mouth once daily.Marland KitchenMarland Kitchen 5)  Prednisone 5 Mg Tabs (Prednisone) .... Take as directed 6)  Evista 60 Mg Tabs (Raloxifene hcl) .... Take 1 tablet by mouth once a day as directed by drdickstein. 7)  Tums 500 Mg Chew (Calcium carbonate antacid) .... Take 2 two times a day 8)  Centrum Silver Tabs (Multiple vitamins-minerals) .... Take 1 tablet by mouth once a day 9)  Vitamin D3 2000 Unit Caps (Cholecalciferol) .... Take 1 cap by mouth once daily... 10)  Garlic Oil 1000 Mg Caps (Garlic) .... Take one capsule by mouth once daily 11)  Clorazepate Dipotassium 7.5 Mg Tabs (Clorazepate dipotassium) .... Take 1/2 to 1 tab by mouth three times a day as needed for anxiety...  Patient Instructions: 1)  Today we updated your med list- see below.... 2)  OK to take extra Vit B 12 if you want ( ~56mcg tab would be sufficient)... 3)  We discussed diet + exercise & try to lose 5-10 lbs... 4)  Today we gave you the 2011 Flu vaccine... 5)  Call for any problems.Marland KitchenMarland Kitchen 6)  Please schedule a follow-up appointment in 6 months, & we will plan follow up FASTING blood work at that time...   Immunization History:  Influenza Immunization History:    Influenza:  historical (07/29/2009)   Appended Document: flu vaccine given to pt at ov today     Clinical Lists Changes  Orders: Added new Service order of Flu Vaccine 35yrs + MEDICARE PATIENTS ([id number]) - Signed Added new Service order of Administration Flu vaccine - MCR (U0454) - Signed Observations: Added new observation of FLU VAX VIS: 05/10/2010 version (06/27/2010 10:21) Added new observation of FLU VAXLOT: [id number] (06/27/2010 10:21) Added new observation of FLU VAXMFR: [employer]axosmithkline (06/27/2010 10:21) Added new observation of FLU VAX EXP: 04/15/2011  (06/27/2010 10:21) Added new observation of FLU VAX DSE: 0.78ml (06/27/2010 10:21) Added new observation of FLU VAX: Fluvax 3+ (06/27/2010 10:21)Flu Vaccine Consent Questions     Do you have a history of severe allergic reactions to this vaccine? no    Any prior history of allergic reactions to egg and/or gelatin? no    Do you have a sensitivity to the preservative Thimersol? no    Do you have a past history of Guillan-Barre Syndrome? no    Do you currently have an acute febrile illness? no    Have you ever had a severe reaction to latex? no    Vaccine information given and explained to patient? yes    Are you currently pregnant? no    Lot Number:[id number]   Exp Date:04/15/2011   Site Given  Left Deltoid IM Randell Loop CMA  June 27, 2010 10:21 AM    of FLU VAX EXP: 04/15/2011 (06/27/2010 10:21) Added new observation of FLU VAX DSE: 0.52ml (06/27/2010 10:21) Added new observation of FLU VAX: Fluvax 3+ (06/27/2010 10:21)     .[email or web address]

## 2010-11-15 NOTE — Letter (Signed)
Summary: Tourist information centre manager [facility]  [address]   [place], [place] [address]   Phone: [phone]  Fax: [phone]    March 17, 2010  Chief [address] [place]  [address]  [place], [place] [address]  RE: DAI APEL  Juror: # [id number]   Dear Sharon Holt:   Theresia Lo of  [place], [place] , [place] has just informed me that he has been chosen to serve on the jury beginning the   16 day of  June ,  2011 .  Sharon Holt  is a patient under my care with the diagnosos of Hypertension, Palpatations and a history of Cancer . I do not feel that Sharon Holt should serve on the jury. I would like to request that Sharon Holt be excused from jury duty permanently.  Your consideration of this matter is greatly appreciated.  Respectfully,       Sharon Holt. Sharon Holt. D.

## 2010-11-15 NOTE — Progress Notes (Signed)
Summary: jury summons  Phone Note Call from Patient Call back at [facility] Phone [phone]   Caller: Patient Call For: Marton Malizia Reason for Call: Talk to Nurse Summary of Call: pt brought in Jury Summons(at Angela's desk).  Please call pt when complete. Initial call taken by: Eugene Gavia,  March 17, 2010 11:31 AM  Follow-up for Phone Call        form given to Haynes Bast  March 17, 2010 12:03 PM    form has been signed and letter is in the mail and i called the pt and she is aware Randell Loop CMA  March 17, 2010 4:20 PM

## 2010-11-15 NOTE — Letter (Signed)
Summary: [facility]  [facility]   Imported By: Sherian Rein 08/27/2010 11:08:23  _____________________________________________________________________  External Attachment:    Type:   Image     Comment:   External Document

## 2010-12-26 ENCOUNTER — Ambulatory Visit (INDEPENDENT_AMBULATORY_CARE_PROVIDER_SITE_OTHER)
Admission: RE | Admit: 2010-12-26 | Discharge: 2010-12-26 | Disposition: A | Discharge status: Home / Self Care | Payer: Medicare PPO | Source: Ambulatory Visit | Attending: Pulmonary Disease | Admitting: Pulmonary Disease

## 2010-12-26 ENCOUNTER — Other Ambulatory Visit: Payer: Self-pay | Admitting: Pulmonary Disease

## 2010-12-26 ENCOUNTER — Ambulatory Visit (INDEPENDENT_AMBULATORY_CARE_PROVIDER_SITE_OTHER): Payer: Self-pay | Admitting: Pulmonary Disease

## 2010-12-26 ENCOUNTER — Encounter: Payer: Self-pay | Admitting: Pulmonary Disease

## 2010-12-26 ENCOUNTER — Other Ambulatory Visit: Payer: Medicare PPO

## 2010-12-26 DIAGNOSIS — I10 Essential (primary) hypertension: Secondary | ICD-10-CM

## 2010-12-26 DIAGNOSIS — R7309 Other abnormal glucose: Secondary | ICD-10-CM

## 2010-12-26 DIAGNOSIS — C211 Malignant neoplasm of anal canal: Secondary | ICD-10-CM

## 2010-12-26 DIAGNOSIS — D649 Anemia, unspecified: Secondary | ICD-10-CM

## 2010-12-26 DIAGNOSIS — R002 Palpitations: Secondary | ICD-10-CM

## 2010-12-26 DIAGNOSIS — M899 Disorder of bone, unspecified: Secondary | ICD-10-CM

## 2010-12-26 DIAGNOSIS — I059 Rheumatic mitral valve disease, unspecified: Secondary | ICD-10-CM

## 2010-12-26 DIAGNOSIS — E785 Hyperlipidemia, unspecified: Secondary | ICD-10-CM

## 2010-12-26 DIAGNOSIS — K739 Chronic hepatitis, unspecified: Secondary | ICD-10-CM

## 2010-12-26 LAB — BASIC METABOLIC PANEL
BUN: 15 mg/dL (ref 6–23)
Calcium: 9.5 mg/dL (ref 8.4–10.5)
GFR: 65.16 mL/min (ref >60.00)
Glucose, Bld: 129 mg/dL — ABNORMAL HIGH (ref 70–99)
Potassium: 4.3 mEq/L (ref 3.5–5.1)
Sodium: 137 mEq/L (ref 135–145)

## 2010-12-26 LAB — CBC WITH DIFFERENTIAL/PLATELET
Basophils Relative: 0.4 % (ref 0.0–3.0)
Eosinophils Relative: 1.7 % (ref 0.0–5.0)
HCT: 39 % (ref 36.0–46.0)
Hemoglobin: 13.5 g/dL (ref 12.0–15.0)
Lymphs Abs: 1.1 10*3/uL (ref 0.7–4.0)
MCV: 91.2 fl (ref 78.0–100.0)
Monocytes Absolute: 0.4 10*3/uL (ref 0.1–1.0)
Monocytes Relative: 9.4 % (ref 3.0–12.0)
Neutro Abs: 2.9 10*3/uL (ref 1.4–7.7)
WBC: 4.5 10*3/uL (ref 4.5–10.5)

## 2010-12-26 LAB — HEPATIC FUNCTION PANEL
AST: 42 U/L — ABNORMAL HIGH (ref 0–37)
Albumin: 4.1 g/dL (ref 3.5–5.2)
Total Bilirubin: 0.7 mg/dL (ref 0.3–1.2)

## 2010-12-26 LAB — URINALYSIS, ROUTINE W REFLEX MICROSCOPIC
Hgb urine dipstick: NEGATIVE
Specific Gravity, Urine: <=1.005 (ref 1.000–1.030)
Urine Glucose: NEGATIVE
Urobilinogen, UA: 0.2 (ref 0.0–1.0)

## 2010-12-26 LAB — LIPID PANEL
Total CHOL/HDL Ratio: 3
VLDL: 23 mg/dL (ref 0.0–40.0)

## 2010-12-26 LAB — TSH: TSH: 1.09 u[IU]/mL (ref 0.35–5.50)

## 2011-01-03 NOTE — Assessment & Plan Note (Signed)
Summary: 6 month rov   CC:  6 month ROV & review of mult medical problems....  History of Present Illness: 70 y/o BF here for a follow up visit... she has mult med problems including HBP;  ?of MVP w/ palpit;  Hyperlipidemia;  Borderline BS;  hx SqCellCa of anal canal;  Granulomatous Hepatitis;  Osteopenia;  & Anxiety...   ~  June 27, 2010:  47mo ROV- doing well w/o new complaints or concerns... BP stable on Aten+Lasix;  denies CP, palpit, SOB, edema, etc;  Chol & Sugars have been OK on diet Rx;  weight stable in 190's;  GI reviewed & stable> on Pred 2.5mg /d w/ LFTs checked;  OK 2011 Flu vaccine today & she inquired about taking a B12 supplement in addition to her MVI & Vit D.   ~  December 26, 2010:      47mo ROV- doing well overall w/o new complaints or concerns... she requests refill for Chlorazepate.    BP is sl elevated at 150/90 range despite Aten & Lasix Rx & she is not checking it often at home;  we decided to add Lisinopril 20mg /d to her regimen & gave her a BP cuff to use for home monitoring;  she denioes CP, palpit, SOB, edema, etc;  CXR is clear w/ norm heart size; and EKG w/ NSR, WNL...    Lipids controlled on diet + exerc w/ FLP today ==> TChol 209, TG 115, HDL 66, LDL 109...    FBS remains sl elev at 129 today on diet & 5# wt loss to 188#;  we reviewed low carb diet + exerc...    Hx anal cancer- s/p XRT & chemoRx> no known recurrence; due for f/u colonoscopy 10/12 per DrPatterson...    Hx granulomatous hepatitis (prob sarcoid) on sm dose Pred w/ good control & she has been reluctant to decr further; currently on Pred5mg - 1/2 tab daily & I suggested she could decr further to Qod if she wanted to; LFT's on todays labs remain WNL...    She remains on Evista, Calcium, MVI, Vit D for her osteopenia followed by GYN DrDickstein who has just retired.       Current Problems:   HYPERTENSION (ICD-401.9) - on TENORMIN 50mg /d, LASIX 20mg /d & K20/d... BP=150/90 today, & not checking regularly  at home... she denies HA, fatigue, visual changes, CP, palipit, dizziness, syncope, dyspnea, edema, etc...  ~  3/12:  we decided to add LISINOPRIL 20mg /d...  ? of MITRAL VALVE PROLAPSE (ICD-424.0) - asymptomatic: no CP or palpit recently...  ~  2DEcho 1/96 showed WNL- no MVP seen, norm LVF w/ EF=55-60%...  PALPITATIONS (ICD-785.1) - no recent symptoms (controlled w/ meds)...  HYPERLIPIDEMIA, BORDERLINE (ICD-272.4) - on diet alone + Fish Oil...  ~  FLP 3/08 showed TChol 168, TG 73, HDL 56, LDL 97... weight 193#, continue diet Rx...  ~  FLP 3/09 showed TChol 210, TG 148, HDL 62, LDL 105... weight 196#, needs better diet efforts.  ~  FLP 9/09 showed TChol 169, TG 115, HDL 68, LDL 79... wt=195#.Marland KitchenMarland Kitchen contin diet efforts.  ~  FLP 3/10 showed TChol 175, TG 91, HDL 55, LDL 102... wt=196#... rec- wt loss.  ~  FLP 3/11 showed TChol 195, TG 116, HDL 77, LDL 95... wt=191#  Hx of OTHER ABNORMAL GLUCOSE (ICD-790.29) - borderline blood sugars in the past- diet Rx...  ~  labs 3/08 showed BS=99, HgA1c=5.6  ~  labs 3/09 showed BS=122, HgA1c=5.6.Marland KitchenMarland Kitchen rec diet + exercise program...  ~  labs 9/09 showed BS= 105  ~  labs 3/10 showed BS= 131, HgA1c= 5.6.Marland KitchenMarland Kitchen rec- contin diet efforts, get wt down.  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  ~  last colonoscopy 10/07 by DrPatterson was normal w/o rectal lesions seen, & otherw neg too... f/u planned 3yrs...  ~  3/10: pt reports no blood seen, stool cards= neg...  ~  3/11:  remains stable & asymptomatic...  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 she had a needle bx of liver in 1998 showing granulomatous hepatitis & ACE was 74 (8-52) and she was treated w/ Pred w/ normalization of her  LFT's (and ACE level)... we were unable to wean to Qod regimen due to incr LFT's again, but stable on daily Rx- now down to 2.5mg /d.  ~  prev LFT's normal 3/08 on Pred 5mg /d... continue same med...  ~  labs 3/09 w/ SGOT 43 (0-37), all others WNL.Marland Kitchen. continue 5mg /d...  ~  labs 9/09 showed SGOT= 42, others normal... rec- decr 5mg  to 1/2 tab daily...  ~  labs 3/10 showed SGOT= 38, others WNL.Marland Kitchen. rec- contin same (2.5mg /d)  ~  labs 9/10 showed SGOT= 40, SGPT= 28  ~  labs 3/11 showed SGOT= 39, SGPT= 25... rec> contin Pred 2.5mg /d  OSTEOPENIA (ICD-733.90) - on EVISTA 60mg /d, ca++, vits, & Vit D 2000 u daily per DrDickstein...  ~  last BMD @ [facility] 9/05 showed TScores -0.7 to -1.1  ~  pt reports BMD and Vit D levels by DrDickstein & decr from 50K per wk to 2000u per day.  ANXIETY (ICD-300.00) - on TRANXENE 7.5mg  as needed.  ANEMIA (ICD-285.9) - prev hx of anemia... stabilized in the 11-12 range...   ~  labs 3/10 showed Hg= 12.0  ~  labs 9/10 showed Hg= 13.2  ~  labs 3/11 showed Hg= 12.6  Health Maintenance -   ~  GYN/ PAP per DrDickstein... pt reports last seen 9/10  ~  [facility]amm[facility]... done 2/10 & reported neg.  ~  Colonoscopy last performed 7/07 by DrPatterson (see above).  ~  Immunizations: she gets yearly Flu shots (given 9/11);  had TETANUS shot  ~2007-8 she says;  given PNEUMOVAX today 3/11- age69.   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 Father died  ~40 w/ stroke, MVA Mother died age 12 w/ Alzheimer's disease 12 Siblings: 3 Brothers drowned in the 63's 1 Brother died w/ prostate cancer 1 Sister died w/ Alzheimer's dis 6 Sibs alive & well  Social History: Reviewed history from 12/27/2009 and no changes required. Married, husb= Joe [place], 42yrs. 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    Vital Signs:  Patient profile:   70 year old female Height:      65 inches Weight:      188 pounds BMI:     31.40 O2 Sat:      97 % on Room air Temp:     99.0 degrees F oral Pulse rate:   90 / minute BP sitting:   150 / 90  (right arm) Cuff size:   regular  Vitals Entered By: Randell Loop CMA (December 26, 2010 9:09 AM)  O2 Sat at Rest %:  97 O2 Flow:  Room air CC: 6 month ROV & review of mult medical problems... Is Patient Diabetic? Yes Pain Assessment Patient in pain? no      Comments no changes in meds today with pt   Physical Exam  Additional Exam:  WD, Sl overweight, 69 y/o BF in NAD... GENERAL:  Alert & oriented; pleasant & cooperative... HEENT:  [place]/AT,  EOM-wnl, PERRLA, EACs-clear, TMs-wnl, NOSE-clear, THROAT-clear & wnl. NECK:  Supple w/ fairROM; no JVD; normal carotid impulses w/o bruits; no thyromegaly or nodules palpated; no lymphadenopathy. CHEST:  Clear to P & A; without wheezes/ rales/ or rhonchi. HEART:  Regular Rhythm; without murmurs/ rubs/ or gallops. ABDOMEN:  Soft & nontender; normal bowel sounds; no organomegaly or masses detected. EXT: without deformities or arthritic changes; no varicose veins/ venous insuffic/ or edema. She has good ROM right shoulder... NEURO:  CN's intact; motor testing normal; sensory testing normal; gait normal & balance OK. DERM:  No lesions noted; no rash etc...    Impression & Recommendations:  Problem # 1:  HYPERTENSION (ICD-401.9) BP sl elev today & not checking at home... we decided to add LISINOPRIL 20mg /d to her regimen & we gave her a BP cuff to use for home monitoring... ROV 61mo to recheck BP etc... Her updated medication list for this problem includes:    Tenormin 50 Mg Tabs (Atenolol) .Marland Kitchen... Take 1 tablet by mouth once a day    Lasix 20 Mg Tabs (Furosemide) .Marland Kitchen... Take 1 tablet by mouth once a day    Lisinopril 20 Mg Tabs (Lisinopril) .Marland Kitchen... Take 1 tab by mouth once daily...  Orders: T-2 View CXR (71020TC) 12 Lead EKG (12 Lead EKG) TLB-BMP (Basic Metabolic Panel-BMET) (80048-METABOL) TLB-Hepatic/Liver Function Pnl (80076-HEPATIC) TLB-CBC Platelet - w/Differential (85025-CBCD) TLB-Lipid Panel (80061-LIPID) TLB-TSH (Thyroid Stimulating Hormone) (84443-TSH) TLB-Udip w/ Micro (81001-URINE)  Problem # 2:  MITRAL VALVE PROLAPSE (ICD-424.0) She denies CP, palpit, SOB, etc... Her updated medication list for this problem includes:    Tenormin 50 Mg Tabs (Atenolol) .Marland Kitchen... Take 1 tablet by mouth once a day  Problem # 3:  HYPERLIPIDEMIA, BORDERLINE (ICD-272.4) On diet alone + Fish OPil & wt down 5#... even so her FLP is borderline & she is rec to incr efforts at low chol/ low fat  diet...  Problem # 4:  Hx of OTHER ABNORMAL GLUCOSE (ICD-790.29) She has mild  fasting hyperglycemia & diet + mexercise program were stressed to the pt...  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 she is due for colonoscop[y 10/12 per DrPatterson...  Problem # 6:  UNSPECIFIED CHRONIC HEPATITIS (ICD-571.40) LFTs remain controlled on the tiny dose of Pred= 2.5mg  per day & we discussed posss decr further to Qod...  Problem # 7:  OSTEOPENIA (ICD-733.90) Followed by GYN... Her updated medication list for this problem includes:    Evista 60 Mg Tabs (Raloxifene hcl) .Marland Kitchen... Take 1 tablet by mouth once a day as directed by drdickstein.  Problem # 8:  ANXIETY (ICD-300.00) She requests refill of the Chloraz... Her updated medication list for this problem includes:    Clorazepate Dipotassium 7.5 Mg Tabs (Clorazepate dipotassium) .Marland Kitchen... Take 1/2 to 1 tab by mouth three times a day as needed for anxiety...  Complete Medication List: 1)  Tenormin 50 Mg Tabs (Atenolol) .... Take 1 tablet by mouth once a day 2)  Lasix 20 Mg Tabs (Furosemide) .... Take 1 tablet by mouth once a day 3)  Klor-con M20 20 Meq Tbcr (Potassium chloride crys cr) .... Take 1 tablet by mouth once a day 4)  Fish Oil 1000 Mg Caps (Omega-3 fatty acids) .Marland Kitchen.. 1 cap by mouth once daily.Marland KitchenMarland Kitchen 5)  Prednisone 5 Mg Tabs (Prednisone) .... Take as directed 6)  Evista 60 Mg Tabs (Raloxifene hcl) .... Take 1 tablet by mouth once a day as directed by drdickstein. 7)  Tums 500 Mg Chew (Calcium carbonate antacid) .... Take 2 two times a day 8)  Centrum Silver Tabs (Multiple vitamins-minerals) .... Take 1 tablet by mouth once a day 9)  Vitamin D3 2000 Unit Caps (Cholecalciferol) .... Take 1 cap by mouth once daily... 10)  Garlic Oil 1000 Mg Caps (Garlic) .... Take one capsule by mouth once daily 11)  Clorazepate Dipotassium 7.5 Mg Tabs (Clorazepate dipotassium) .... Take 1/2 to 1 tab by mouth three  times a day as needed for anxiety... 12)  Lisinopril 20 Mg Tabs (Lisinopril) .... Take 1 tab by mouth once daily...  Patient Instructions: 1)  Today we updated your med list- see below.... 2)  We refilled your meds for 2012...  3)  We also wrote a new perscription for your BP> LISINOPRIL 20mg  take 1 tab daily.Marland KitchenMarland Kitchen 4)  Monitor your BP at home w/ your new BP cuff & keep a log of your readings.Marland KitchenMarland Kitchen 5)  Call for any questions.Marland KitchenMarland Kitchen 6)  Today we did your follow up CXR, EKG & FASTING blood work... 7)  please call the "phone tree" in a few days for your lab results...  8)  Let's plan a follow up visit in about 3months to recheck your BP & look at your home BP log... Prescriptions: LISINOPRIL 20 MG TABS (LISINOPRIL) take 1 tab by mouth once daily...  #90 x 4   Entered and Authorized by:   Michele Mcalpine MD   Signed by:   Michele Mcalpine MD on 12/26/2010   Method used:   Print then Give to Patient   RxID:   [id number] CLORAZEPATE DIPOTASSIUM 7.5 MG  TABS (CLORAZEPATE DIPOTASSIUM) take 1/2 to 1 tab by mouth three times a day as needed for anxiety...  #90 x 6   Entered and Authorized by:   Michele Mcalpine MD   Signed by:   Michele Mcalpine MD on 12/26/2010   Method used:   Print then Give to Patient  RxID:   [id number] PREDNISONE 5 MG  TABS (PREDNISONE) take as directed  #90 x 4   Entered and Authorized by:   Michele Mcalpine MD   Signed by:   Michele Mcalpine MD on 12/26/2010   Method used:   Print then Give to Patient   RxID:   [id number] KLOR-CON M20 20 MEQ  TBCR (POTASSIUM CHLORIDE CRYS CR) Take 1 tablet by mouth once a day  #90 x 4   Entered and Authorized by:   Michele Mcalpine MD   Signed by:   Michele Mcalpine MD on 12/26/2010   Method used:   Print then Give to Patient   RxID:   [id number] LASIX 20 MG  TABS (FUROSEMIDE) Take 1 tablet by mouth once a day  #90 x 4   Entered and Authorized by:   Michele Mcalpine MD   Signed by:   Michele Mcalpine MD on 12/26/2010   Method used:   Print then  Give to Patient   RxID:   [id number] TENORMIN 50 MG  TABS (ATENOLOL) Take 1 tablet by mouth once a day  #90 x 4   Entered and Authorized by:   Michele Mcalpine MD   Signed by:   Michele Mcalpine MD on 12/26/2010   Method used:   Print then Give to Patient   RxID:   [id number]

## 2011-03-03 NOTE — Assessment & Plan Note (Signed)
[facility]                           GASTROENTEROLOGY OFFICE NOTE   NAME:Sharon Holt                 MRN:          [id number]  DATE:07/03/2006                            DOB:          12-27-1940    PROBLEM:  History of anal cancer due for follow-up colonoscopy.   HISTORY:  Sharon Holt is a pleasant 70 year old African-American female known  to Dr. Jarold Motto who has had prior colonoscopies.  She had flexible  sigmoidoscopy in 1998, which was normal, and then had a full colonoscopy in  October of 2002.  Noted to have large external hemorrhoids and smaller  internal hemorrhoids.  No other lesions were found.  Approximately 1 year  later on digital rectal exam by her gynecologist, Dr. Rosalio Macadamia, she was  felt to have an abnormality in the anorectal area and was referred to Dr.  Jamey Ripa, and subsequently diagnosed with a squamous cell CA of the anal  canal.  This was in the left lateral position and was approximately 1 cm in  size.  She underwent radiation and then chemotherapy and has done well with  no evidence of recurrence.  She has had serial follow-up with Dr. Jamey Ripa and  serial endoscopies, all of which have been negative.  She is referred at  this time for a full colonoscopy.  She has no GI complaints, has been  feeling well.  Her bowel movements have been normal.  No complaints of  abdominal or rectal discomfort.  No melena or hematochezia.   The patient does have a history of granulomatous hepatitis, is followed by  Dr. Kriste Basque currently and maintained on chronic steroids.  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  Last liver function studies done June 19, 2006  entirely normal with the exception of an SGOT of 50.   CURRENT MEDICATIONS:  1. Clorazepate 7.5 one p.o. q. day.  2. Tenormin 50 q. day.  3. Lasix 20 q. day.  4. Vitamin E daily.  5. Centrum Silver  daily.  6. Tums 2 p.o. q. day.  7. Prednisone 5 mg daily.  8. K-Dur 20 daily.  9. Evista 60 mg daily.   ALLERGIES:  No known drug allergies.   PAST HISTORY:  Pertinent for the mitral valve prolapse, hypertension,  anxiety, granulomatous hepatitis on chronic steroids, and squamous cell  carcinoma of the anal canal diagnosed April of 03.  Status post radiation  and chemotherapy.   FAMILY HISTORY:  Negative for colon cancer or polyps.   SOCIAL HISTORY:  The patient is married, employed in [employer].  She has 3 grown  children.  She is a nonsmoker, nondrinker.   REVIEW OF SYSTEMS:  Completely negative.   PHYSICAL EXAM:  Well-developed, healthy-appearing African-American female in  no acute distress.  Height is 5 feet, 5 inches.  Weight is 189.8.  Blood pressure 124/74, pulse  76.  HEENT:  Nontraumatic, normocephalic.  EOMI.  PERRLA.  Sclerae anicteric.  CARDIOVASCULAR:  Regular rate and rhythm with S1 and S2.  PULMONARY:  Clear to A and P.  ABDOMEN:  Soft.  Bowel sounds are active.  She is nontender.  There is no  palpable mass or hepatosplenomegaly.  RECTAL:  Exam is not done at this time.   IMPRESSION:  18. 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  No      evidence of recurrence since.  Now due for follow-up colonoscopy.  2. 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  Stable on chronic      steroids.  3. Hypertension.   PLAN:  Schedule colonoscopy at her convenience.                                   Amy Esterwood, PA-C                                Vania Rea. Jarold Motto, MD, Clementeen Graham, Jerrel Ivory   AE/MedQ  DD:  07/03/2006  DT:  07/04/2006  Job #:  [id number]   cc:   Currie Paris, M.D.

## 2011-03-27 ENCOUNTER — Encounter: Payer: Self-pay | Admitting: Pulmonary Disease

## 2011-03-29 ENCOUNTER — Ambulatory Visit (INDEPENDENT_AMBULATORY_CARE_PROVIDER_SITE_OTHER): Payer: Medicare PPO | Admitting: Pulmonary Disease

## 2011-03-29 ENCOUNTER — Encounter: Payer: Self-pay | Admitting: Pulmonary Disease

## 2011-03-29 DIAGNOSIS — I059 Rheumatic mitral valve disease, unspecified: Secondary | ICD-10-CM

## 2011-03-29 DIAGNOSIS — E785 Hyperlipidemia, unspecified: Secondary | ICD-10-CM

## 2011-03-29 DIAGNOSIS — F411 Generalized anxiety disorder: Secondary | ICD-10-CM

## 2011-03-29 DIAGNOSIS — K739 Chronic hepatitis, unspecified: Secondary | ICD-10-CM

## 2011-03-29 DIAGNOSIS — R7309 Other abnormal glucose: Secondary | ICD-10-CM

## 2011-03-29 DIAGNOSIS — I10 Essential (primary) hypertension: Secondary | ICD-10-CM

## 2011-03-29 MED ORDER — CLORAZEPATE DIPOTASSIUM 7.5 MG PO TABS: ORAL_TABLET | ORAL | Status: DC

## 2011-03-29 NOTE — Patient Instructions (Signed)
Today we updated your med list in EPIC...    Continue all 3 BP meds as your pressure looks good on this regimen...    We refilled your Chlorazepate as requested...  Call for any questions...  Let's plan a follow up visit in 3-4 months.Marland KitchenMarland Kitchen

## 2011-04-01 ENCOUNTER — Encounter: Payer: Self-pay | Admitting: Pulmonary Disease

## 2011-04-01 NOTE — Progress Notes (Signed)
Subjective:    Patient ID: Sharon Holt, female    DOB: [birth year], 70 y.o.   MRN: [id number]  HPI 70 y/o BF here for a follow up visit... she has mult med problems including HBP;  ?of MVP w/ palpit;  Hyperlipidemia;  Borderline BS;  hx SqCellCa of anal canal;  Granulomatous Hepatitis;  Osteopenia;  & Anxiety...  ~  June 27, 2010:  488mo ROV- doing well w/o new complaints or concerns... BP stable on Aten+Lasix;  denies CP, palpit, SOB, edema, etc;  Chol & Sugars have been OK on diet Rx;  weight stable in 190's;  GI reviewed & stable> on Pred 2.5mg /d w/ LFTs checked;  OK 2011 Flu vaccine today & she inquired about taking a B12 supplement in addition to her MVI & Vit D.  ~  December 26, 2010:  488mo ROV- doing well overall w/o new complaints or concerns... she requests refill for Chlorazepate.    BP is sl elevated at 150/90 range despite Aten & Lasix Rx & she is not checking it often at home;  we decided to add Lisinopril 20mg /d to her regimen & gave her a BP cuff to use for home monitoring;  she denioes CP, palpit, SOB, edema, etc;  CXR is clear w/ norm heart size; and EKG w/ NSR, WNL...    Lipids controlled on diet + exerc w/ FLP today ==> TChol 209, TG 115, HDL 66, LDL 109...    FBS remains sl elev at 129 today on diet & 5# wt loss to 188#;  we reviewed low carb diet + exerc...    Hx anal cancer- s/p XRT & chemoRx> no known recurrence; due for f/u colonoscopy 10/12 per DrPatterson...    Hx granulomatous hepatitis (prob sarcoid) on sm dose Pred w/ good control & she has been reluctant to decr further; currently on Pred5mg - 1/2 tab daily & I suggested she could decr further to Qod if she wanted to; LFT's on todays labs remain WNL...    She remains on Evista, Calcium, MVI, Vit D for her osteopenia followed by GYN DrDickstein who has just retired.  ~  March 29, 2011:  88mo ROV & f/u BP on 3 med regimen- Atenolol, Lisinopril, Lasix> her home checks have been excellent in the 120-130/ 70-80 range &  she is feeling well... Requests refill of her chlorazepate rx... We reviewed her other problems as well & decided to decr her low dose Pred for her granulomatous hepatitis to 1/2 Qod...   Problem List:  HYPERTENSION (ICD-401.9) - on TENORMIN 50mg /d, LISINOPRIL 20mg /d, LASIX 20mg /d & K20/d... BP=132/82 today, & similar at home... she denies HA, fatigue, visual changes, CP, palipit, dizziness, syncope, dyspnea, edema, etc... ~  3/12:  we decided to add LISINOPRIL 20mg /d to her Aten50 & Lasix20... ~  6/12:  BP looks great on this 3 med regimen, 132/82 today & similar at home...  ? of MITRAL VALVE PROLAPSE (ICD-424.0) - asymptomatic: no CP or palpit recently... ~  2DEcho 1/96 showed WNL- no MVP seen, norm LVF w/ EF=55-60%...  PALPITATIONS (ICD-785.1) - no recent symptoms (controlled w/ meds)...  HYPERLIPIDEMIA, BORDERLINE (ICD-272.4) - on diet alone + Fish Oil... ~  FLP 3/08 showed TChol 168, TG 73, HDL 56, LDL 97... weight 193#, continue diet Rx... ~  FLP 3/09 showed TChol 210, TG 148, HDL 62, LDL 105... weight 196#, needs better diet efforts. ~  FLP 9/09 showed TChol 169, TG 115, HDL 68, LDL 79... wt=195#.Marland KitchenMarland Kitchen contin diet efforts. ~  FLP 3/10 showed TChol 175, TG 91, HDL 55, LDL 102... wt=196#... rec- wt loss. ~  FLP 3/11 showed TChol 195, TG 116, HDL 77, LDL 95... Wt=191# ~  FLP 3/12 showed TChol 209, TG 115, HDL 66, LDL 109... Wt=188#  Hx of OTHER ABNORMAL GLUCOSE (ICD-790.29) - borderline blood sugars in the past- diet Rx... ~  labs 3/08 showed BS=99, HgA1c=5.6 ~  labs 3/09 showed BS=122, HgA1c=5.6.Marland KitchenMarland Kitchen rec diet + exercise program... ~  labs 9/09 showed BS= 105 ~  labs 3/10 showed BS= 131, HgA1c= 5.6.Marland KitchenMarland Kitchen rec- contin diet efforts, get wt down. 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 ~  last colonoscopy 10/07 by DrPatterson was normal w/o rectal lesions seen, & otherw neg too... f/u planned 89yrs... ~  3/10: pt reports no blood seen, stool cards= neg... ~  3/11:  remains stable & asymptomatic...  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 she had a needle bx of liver in 1998 showing granulomatous hepatitis & ACE was 74 (8-52) and she was treated w/ Pred w/ normalization of her LFT's (and ACE level)... we were unable to wean to Qod regimen due to incr LFT's again, but stable on daily Rx- now down to 2.5mg /d. ~  prev LFT's normal 3/08 on Pred 5mg /d... continue same med... ~  labs 3/09 w/ SGOT 43 (0-37), all others WNL.Marland Kitchen. continue 5mg /d... ~  labs 9/09 showed SGOT= 42, others normal... rec- decr 5mg  to 1/2 tab daily... ~  labs 3/10 showed SGOT= 38, others WNL.Marland Kitchen. rec- contin same (2.5mg /d) ~  labs 9/10 showed SGOT= 40, SGPT= 28 ~  labs 3/11 showed SGOT= 39, SGPT= 25... rec> contin Pred 2.5mg /d ~  Labs 3/12 showed SGOT= 42, SGPT= 26 ~  6/12:  We discussed trying to decr the Pred to 2.5 Qod again...  OSTEOPENIA (ICD-733.90) - on EVISTA 60mg /d, ca++, vits, & Vit D 2000 u daily per DrDickstein... ~  last BMD @ [facility] 9/05 showed TScores -0.7 to -1.1 ~  pt reports BMD and Vit D levels by DrDickstein & decr from 50K per wk to 2000u per day.  ANXIETY (ICD-300.00) - on TRANXENE 7.5mg  as needed.  ANEMIA (ICD-285.9) - prev hx of anemia... stabilized in the 11-12 range...  ~  labs 3/10 showed Hg= 12.0 ~  labs 9/10 showed Hg= 13.2 ~  labs 3/11 showed Hg= 12.6 ~  Labs 3/12 showed Hg= 13.5  Health Maintenance -  ~  GYN/ PAP per DrDickstein... pt reports last seen 9/10 ~  Mammograms at [facility]... done 2/10 & reported neg. ~  Colonoscopy last performed 7/07 by DrPatterson (see above). ~  Immunizations: she gets yearly Flu shots (given 9/11);  had TETANUS shot ~2007-8 she  says;  given PNEUMOVAX today 3/11- age69.   Past Surgical History  Procedure Date  . Cholecystectomy     Outpatient Encounter Prescriptions as of 03/29/2011  Medication Sig Dispense Refill  . atenolol (TENORMIN) 50 MG tablet Take 50 mg by mouth daily.        . calcium carbonate (TUMS - DOSED IN MG ELEMENTAL CALCIUM) 500 MG chewable tablet Chew 1 tablet by mouth daily.        Marland Kitchen  Cholecalciferol (VITAMIN D3) 2000 UNITS capsule Take 2,000 Units by mouth daily.        . clorazepate (TRANXENE) 7.5 MG tablet Take 1/2 to 1 tablet three times a day as needed  90 tablet  5  . fish oil-omega-3 fatty acids 1000 MG capsule Take 1 g by mouth daily.        . furosemide (LASIX) 20 MG tablet Take 20 mg by mouth daily.        . Garlic Oil 1000 MG CAPS Take 1 capsule by mouth daily.        Marland Kitchen lisinopril (PRINIVIL,ZESTRIL) 20 MG tablet Take 20 mg by mouth daily.        . Multiple Vitamins-Minerals (CENTRUM SILVER PO) Take 1 capsule by mouth daily.        . potassium chloride (KLOR-CON) 20 MEQ packet Take 20 mEq by mouth daily.        . predniSONE (DELTASONE) 5 MG tablet Take 5 mg by mouth daily.        . raloxifene (EVISTA) 60 MG tablet Take 60 mg by mouth daily.          No Known Allergies   Review of Systems        See HPI - all other systems neg except as noted...  The patient denies anorexia, fever, weight loss, weight gain, vision loss, decreased hearing, hoarseness, chest pain, syncope, dyspnea on exertion, peripheral edema, prolonged cough, headaches, hemoptysis, abdominal pain, melena, hematochezia, severe indigestion/heartburn, hematuria, incontinence, muscle weakness, suspicious skin lesions, transient blindness, difficulty walking, depression, unusual weight change, abnormal bleeding, enlarged lymph nodes, and angioedema.    Objective:   Physical Exam     WD, Sl overweight, 70 y/o BF in NAD... GENERAL:  Alert & oriented; pleasant & cooperative... HEENT:  [place]/AT, EOM-wnl, PERRLA, EACs-clear,  TMs-wnl, NOSE-clear, THROAT-clear & wnl. NECK:  Supple w/ fairROM; no JVD; normal carotid impulses w/o bruits; no thyromegaly or nodules palpated; no lymphadenopathy. CHEST:  Clear to P & A; without wheezes/ rales/ or rhonchi. HEART:  Regular Rhythm; without murmurs/ rubs/ or gallops. ABDOMEN:  Soft & nontender; normal bowel sounds; no organomegaly or masses detected. EXT: without deformities or arthritic changes; no varicose veins/ venous insuffic/ or edema. She has good ROM right shoulder... NEURO:  CN's intact; motor testing normal; sensory testing normal; gait normal & balance OK. DERM:  No lesions noted; no rash etc...   Assessment & Plan:   HBP>  Improved on 3 med regimen, continue same...  CHOL>  Stable on diet + Fish Oil, continue same...  Borderline DM>  On diet alone & needs to get wt down!  Hx of Anal Cancer> followed by [employer], DrDickstein, DrStreck, Duwayne Heck...  Granulomatous Hepatitis>  Stable on low dose Pred= 2.5mg /d; we haven't tried cutting it further in some time, therefore we will attempt to cut to Qod...  Osteopenia>  Remains on Evista, Calcium, MVI, Vit d...  Anxiety>  Refill Chlorazepate per request..Marland Kitchen

## 2011-06-07 ENCOUNTER — Encounter: Payer: Self-pay | Admitting: Pulmonary Disease

## 2011-06-30 ENCOUNTER — Encounter: Payer: Self-pay | Admitting: Gastroenterology

## 2011-06-30 ENCOUNTER — Ambulatory Visit (INDEPENDENT_AMBULATORY_CARE_PROVIDER_SITE_OTHER): Payer: Medicare PPO | Admitting: Pulmonary Disease

## 2011-06-30 ENCOUNTER — Other Ambulatory Visit (INDEPENDENT_AMBULATORY_CARE_PROVIDER_SITE_OTHER): Payer: Medicare PPO

## 2011-06-30 ENCOUNTER — Encounter: Payer: Self-pay | Admitting: Pulmonary Disease

## 2011-06-30 DIAGNOSIS — I059 Rheumatic mitral valve disease, unspecified: Secondary | ICD-10-CM

## 2011-06-30 DIAGNOSIS — Z23 Encounter for immunization: Secondary | ICD-10-CM

## 2011-06-30 DIAGNOSIS — E785 Hyperlipidemia, unspecified: Secondary | ICD-10-CM

## 2011-06-30 DIAGNOSIS — M899 Disorder of bone, unspecified: Secondary | ICD-10-CM

## 2011-06-30 DIAGNOSIS — C211 Malignant neoplasm of anal canal: Secondary | ICD-10-CM

## 2011-06-30 DIAGNOSIS — F411 Generalized anxiety disorder: Secondary | ICD-10-CM

## 2011-06-30 DIAGNOSIS — R7309 Other abnormal glucose: Secondary | ICD-10-CM

## 2011-06-30 DIAGNOSIS — K739 Chronic hepatitis, unspecified: Secondary | ICD-10-CM

## 2011-06-30 DIAGNOSIS — I10 Essential (primary) hypertension: Secondary | ICD-10-CM

## 2011-06-30 DIAGNOSIS — M949 Disorder of cartilage, unspecified: Secondary | ICD-10-CM

## 2011-06-30 LAB — HEPATIC FUNCTION PANEL
Bilirubin, Direct: 0.1 mg/dL (ref 0.0–0.3)
Total Bilirubin: 0.6 mg/dL (ref 0.3–1.2)

## 2011-06-30 LAB — BASIC METABOLIC PANEL
Calcium: 9.1 mg/dL (ref 8.4–10.5)
Chloride: 104 mEq/L (ref 96–112)
Creatinine, Ser: 0.8 mg/dL (ref 0.4–1.2)
GFR: 72.35 mL/min (ref >60.00)

## 2011-06-30 NOTE — Progress Notes (Signed)
Subjective:    Patient ID: Sharon Holt, female    DOB: [birth year], 70 y.o.   MRN: [id number]  HPI 70 y/o BF here for a follow up visit... she has mult med problems including HBP;  ?of MVP w/ palpit;  Hyperlipidemia;  Borderline BS;  hx SqCellCa of anal canal;  Granulomatous Hepatitis;  Osteopenia;  & Anxiety...  ~  June 27, 2010:  20mo ROV- doing well w/o new complaints or concerns... BP stable on Aten+Lasix;  denies CP, palpit, SOB, edema, etc;  Chol & Sugars have been OK on diet Rx;  weight stable in 190's;  GI reviewed & stable> on Pred 2.5mg /d w/ LFTs checked;  OK 2011 Flu vaccine today & she inquired about taking a B12 supplement in addition to her MVI & Vit D.  ~  December 26, 2010:  20mo ROV- doing well overall w/o new complaints or concerns... she requests refill for Chlorazepate.    BP is sl elevated at 150/90 range despite Aten & Lasix Rx & she is not checking it often at home;  we decided to add Lisinopril 20mg /d to her regimen & gave her a BP cuff to use for home monitoring;  she denioes CP, palpit, SOB, edema, etc;  CXR is clear w/ norm heart size; and EKG w/ NSR, WNL...    Lipids controlled on diet + exerc w/ FLP today ==> TChol 209, TG 115, HDL 66, LDL 109...    FBS remains sl elev at 129 today on diet & 5# wt loss to 188#;  we reviewed low carb diet + exerc...    Hx anal cancer- s/p XRT & chemoRx> no known recurrence; due for f/u colonoscopy 10/12 per DrPatterson...    Hx granulomatous hepatitis (prob sarcoid) on sm dose Pred w/ good control & she has been reluctant to decr further; currently on Pred5mg - 1/2 tab daily & I suggested she could decr further to Qod if she wanted to; LFT's on todays labs remain WNL...    She remains on Evista, Calcium, MVI, Vit D for her osteopenia followed by GYN DrDickstein who has just retired.  ~  March 29, 2011:  14mo ROV & f/u BP on 3 med regimen- Atenolol, Lisinopril, Lasix> her home checks have been excellent in the 120-130/ 70-80 range &  she is feeling well... Requests refill of her chlorazepate rx... We reviewed her other problems as well & decided to decr her low dose Pred for her granulomatous hepatitis to 1/2 Qod...  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 NOTE: BMet & LFTs look ok on Pred 2.5mg Qod.    BP controlled on 3 med regimen, measures 150/80 today & better at home she says...    Chol has been looking pretty good on diet alone & her weight is down 8# to 180# w/ walking, keep up the good work...    Other medical problems as noted below...          Problem List:  HYPERTENSION (ICD-401.9) - on TENORMIN 50mg /d, LISINOPRIL 20mg /d, LASIX 20mg /d &  K20/d... BP=132/82 today, & similar at home... she denies HA, fatigue, visual changes, CP, palipit, dizziness, syncope, dyspnea, edema, etc... ~  3/12:  we decided to add LISINOPRIL 20mg /d to her Aten50 & Lasix20... ~  6/12:  BP looks great on this 3 med regimen, 132/82 today & similar at home...  ? of MITRAL VALVE PROLAPSE (ICD-424.0) - asymptomatic: no CP or palpit recently... ~  2DEcho 1/96 showed WNL- no MVP seen, norm LVF w/ EF=55-60%...  PALPITATIONS (ICD-785.1) - no recent symptoms (controlled w/ meds)...  HYPERLIPIDEMIA, BORDERLINE (ICD-272.4) - on diet alone + Fish Oil... ~  FLP 3/08 showed TChol 168, TG 73, HDL 56, LDL 97... weight 193#, continue diet Rx... ~  FLP 3/09 showed TChol 210, TG 148, HDL 62, LDL 105... weight 196#, needs better diet efforts. ~  FLP 9/09 showed TChol 169, TG 115, HDL 68, LDL 79... wt=195#.Marland KitchenMarland Kitchen contin diet efforts. ~  FLP 3/10 showed TChol 175, TG 91, HDL  55, LDL 102... wt=196#... rec- wt loss. ~  FLP 3/11 showed TChol 195, TG 116, HDL 77, LDL 95... Wt=191# ~  FLP 3/12 showed TChol 209, TG 115, HDL 66, LDL 109... Wt=188#  Hx of OTHER ABNORMAL GLUCOSE (ICD-790.29) - borderline blood sugars in the past- diet Rx... ~  labs 3/08 showed BS=99, HgA1c=5.6 ~  labs 3/09 showed BS=122, HgA1c=5.6.Marland KitchenMarland Kitchen rec diet + exercise program... ~  labs 9/09 showed BS= 105 ~  labs 3/10 showed BS= 131, HgA1c= 5.6.Marland KitchenMarland Kitchen rec- contin diet efforts, get wt down. 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 ~  last colonoscopy 10/07 by DrPatterson was normal w/o rectal lesions seen, & otherw neg too... f/u planned 91yrs...  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 she had a needle bx of liver in 1998 showing granulomatous hepatitis & ACE was 74 (8-52) and she was treated w/ Pred w/ normalization of her LFT's (and ACE level)... we were unable to wean to Qod regimen due to incr LFT's again, but stable on daily Rx- now down to 2.5mg /d. ~  prev LFT's normal 3/08 on Pred 5mg /d... continue same med... ~  labs 3/09 w/ SGOT 43 (0-37), all others WNL.Marland Kitchen. continue 5mg /d... ~  labs 9/09 showed SGOT= 42, others normal... rec- decr 5mg  to 1/2 tab daily... ~  labs 3/10 showed SGOT= 38, others WNL.Marland Kitchen. rec- contin same (2.5mg /d) ~  labs 9/10 showed SGOT= 40, SGPT= 28 ~  labs 3/11 showed SGOT= 39, SGPT= 25... Continue Pred 2.5mg /d ~  Labs 3/12 showed SGOT= 42, SGPT= 26 ~  6/12:  We discussed trying to decr the Pred to 2.5 Qod again... ~  Labs 9/12 showed SGOT= 38, SGPT= 21... Continue Pred 2.5mg Qod.  OSTEOPENIA (ICD-733.90) - on EVISTA 60mg /d, ca++, vits, & Vit D 2000 u daily per  DrDickstein... ~  last BMD @ [facility] 9/05 showed TScores -0.7 to -1.1 ~  pt reports BMD and Vit D levels by DrDickstein & decr from 50K per wk to 2000u per day.  ANXIETY (ICD-300.00) - on TRANXENE 7.5mg  as needed.  ANEMIA (ICD-285.9) - prev hx of anemia... stabilized in the 11-12 range...  ~  labs 3/10 showed Hg= 12.0 ~  labs 9/10 showed  Hg= 13.2 ~  labs 3/11 showed Hg= 12.6 ~  Labs 3/12 showed Hg= 13.5  Health Maintenance -  ~  GYN/ PAP per DrDickstein... pt reports last seen 9/10 ~  [facility]... done 2/10 & reported neg. ~  Colonoscopy last performed 7/07 by DrPatterson (see above). ~  Immunizations: she gets yearly Flu shots (given 9/11);  had TETANUS shot ~2007-8 she says;  given PNEUMOVAX today 3/11- age69.   Past Surgical History  Procedure Date  . Cholecystectomy     Outpatient Encounter Prescriptions as of 06/30/2011  Medication Sig Dispense Refill  . atenolol (TENORMIN) 50 MG tablet Take 50 mg by mouth daily.        . calcium carbonate (TUMS - DOSED IN MG ELEMENTAL CALCIUM) 500 MG chewable tablet Chew 1 tablet by mouth daily.        . Cholecalciferol (VITAMIN D3) 2000 UNITS capsule Take 2,000 Units by mouth daily.        . clorazepate (TRANXENE) 7.5 MG tablet Take 1/2 to 1 tablet three times a day as needed  90 tablet  5  . fish oil-omega-3 fatty acids 1000 MG capsule Take 1 g by mouth daily.        . furosemide (LASIX) 20 MG tablet Take 20 mg by mouth daily.        . Garlic Oil 1000 MG CAPS Take 1 capsule by mouth daily.        Marland Kitchen lisinopril (PRINIVIL,ZESTRIL) 20 MG tablet Take 20 mg by mouth daily.        . Multiple Vitamins-Minerals (CENTRUM SILVER PO) Take 1 capsule by mouth daily.        . potassium chloride (KLOR-CON) 20 MEQ packet Take 20 mEq by mouth daily.        . predniSONE (DELTASONE) 5 MG tablet Take 5 mg by mouth daily.        . raloxifene (EVISTA) 60 MG tablet Take 60 mg by mouth daily.          No Known Allergies   Current Medications,  Allergies, Past Medical History, Past Surgical History, Family History, and Social History were reviewed in [employer] record.    Review of Systems        See HPI - all other systems neg except as noted... The patient denies anorexia, fever, weight loss, weight gain, vision loss, decreased hearing, hoarseness, chest pain, syncope, dyspnea on exertion, peripheral edema, prolonged cough, headaches, hemoptysis, abdominal pain, melena, hematochezia, severe indigestion/heartburn, hematuria, incontinence, muscle weakness, suspicious skin lesions, transient blindness, difficulty walking, depression, unusual weight change, abnormal bleeding, enlarged lymph nodes, and angioedema.     Objective:   Physical Exam     WD, Sl overweight, 70 y/o BF in NAD... GENERAL:  Alert & oriented; pleasant & cooperative... HEENT:  [place]/AT, EOM-wnl, PERRLA, EACs-clear, TMs-wnl, NOSE-clear, THROAT-clear & wnl. NECK:  Supple w/ fairROM; no JVD; normal carotid impulses w/o bruits; no thyromegaly or nodules palpated; no lymphadenopathy. CHEST:  Clear to P & A; without wheezes/ rales/ or rhonchi. HEART:  Regular Rhythm; without murmurs/ rubs/ or gallops. ABDOMEN:  Soft & nontender; normal bowel sounds; no organomegaly or masses detected. EXT: without deformities or arthritic changes; no varicose veins/ venous insuffic/ or edema. She has good ROM right shoulder... NEURO:  CN's intact; motor testing normal; sensory testing normal; gait normal & balance OK. DERM:  No lesions noted; no rash etc...   Assessment & Plan:   Granulomatous Hepatitis>  Pred was  decr to 2.5mg  Qod & she remains stable, asymptomatic, LFTs ok, continue same...  Hx of Anal Cancer> followed by [employer], DrDickstein, DrStreck, Duwayne Heck; she is due for f/u eval scope by [employer]...   HBP>  Stable on 3 med regimen, continue same...  CHOL>  Stable on diet + Fish Oil, continue same...  Borderline DM>  On diet alone & needs  to get wt down!  Osteopenia>  Remains on Evista, Calcium, MVI, Vit D...  Anxiety>  On Chlorazepate as needed.Marland KitchenMarland Kitchen

## 2011-06-30 NOTE — Patient Instructions (Signed)
Today we updated your med list in EPIC...    Continue your current meds the same...  Keep up the great job of diet & wt reduction!!!  Today we did your follow up  blood work...    Please call the PHONE TREE in a few days for your results...    Dial [id number] & when prompted enter your patient number followed by the # symbol...    Your patient number is:   [id number]#  Call for any questions...  Let's plan a follow up visit in 6 months.Marland KitchenMarland Kitchen

## 2011-07-01 ENCOUNTER — Encounter: Payer: Self-pay | Admitting: Pulmonary Disease

## 2011-07-04 ENCOUNTER — Ambulatory Visit (AMBULATORY_SURGERY_CENTER): Payer: Medicare PPO

## 2011-07-04 ENCOUNTER — Encounter: Payer: Self-pay | Admitting: Gastroenterology

## 2011-07-04 VITALS — Ht 66.0 in | Wt 181.2 lb

## 2011-07-04 DIAGNOSIS — Z1211 Encounter for screening for malignant neoplasm of colon: Secondary | ICD-10-CM

## 2011-07-04 MED ORDER — PEG-KCL-NACL-NASULF-NA ASC-C 100 G PO SOLR: 1.0000 | Freq: Once | ORAL | Status: AC

## 2011-07-17 ENCOUNTER — Ambulatory Visit (AMBULATORY_SURGERY_CENTER): Payer: Medicare PPO | Admitting: Gastroenterology

## 2011-07-17 ENCOUNTER — Encounter: Payer: Self-pay | Admitting: Gastroenterology

## 2011-07-17 VITALS — BP 164/84 | HR 80 | Temp 97.4°F | Resp 12 | Ht 66.0 in | Wt 181.0 lb

## 2011-07-17 DIAGNOSIS — Z85048 Personal history of other malignant neoplasm of rectum, rectosigmoid junction, and anus: Secondary | ICD-10-CM

## 2011-07-17 DIAGNOSIS — Z1211 Encounter for screening for malignant neoplasm of colon: Secondary | ICD-10-CM

## 2011-07-17 DIAGNOSIS — K5730 Diverticulosis of large intestine without perforation or abscess without bleeding: Secondary | ICD-10-CM

## 2011-07-17 MED ORDER — SODIUM CHLORIDE 0.9 % IV SOLN: 500.0000 mL | INTRAVENOUS | Status: DC

## 2011-07-17 NOTE — Progress Notes (Signed)
Addended by: Tonita Cong A on: 07/17/2011 10:10 AM   Modules accepted: Orders

## 2011-07-17 NOTE — Progress Notes (Signed)
No complaints in the recovery room noted. maw

## 2011-07-17 NOTE — Patient Instructions (Signed)
See the picture page for your findings from your exam today.  Follow the green and blue discharge instruction sheets the rest of the day.  Resume your prior medications today. Please call if any questions or concerns.  

## 2011-07-18 ENCOUNTER — Telehealth: Payer: Self-pay | Admitting: *Deleted

## 2011-07-18 NOTE — Telephone Encounter (Signed)

## 2011-12-07 ENCOUNTER — Other Ambulatory Visit: Payer: Self-pay | Admitting: Pulmonary Disease

## 2011-12-18 ENCOUNTER — Telehealth: Payer: Self-pay | Admitting: Pulmonary Disease

## 2011-12-18 MED ORDER — CLORAZEPATE DIPOTASSIUM 7.5 MG PO TABS: ORAL_TABLET | ORAL | Status: DC

## 2011-12-18 NOTE — Telephone Encounter (Signed)
rx refill called to the pharmacy--pt aware this has been called in

## 2011-12-29 ENCOUNTER — Ambulatory Visit: Payer: Medicare PPO | Admitting: Pulmonary Disease

## 2012-02-13 ENCOUNTER — Encounter: Payer: Self-pay | Admitting: Pulmonary Disease

## 2012-02-13 ENCOUNTER — Other Ambulatory Visit (INDEPENDENT_AMBULATORY_CARE_PROVIDER_SITE_OTHER): Payer: Medicare PPO

## 2012-02-13 ENCOUNTER — Ambulatory Visit (INDEPENDENT_AMBULATORY_CARE_PROVIDER_SITE_OTHER): Payer: Medicare PPO | Admitting: Pulmonary Disease

## 2012-02-13 VITALS — BP 146/80 | HR 69 | Temp 96.9°F | Ht 65.0 in | Wt 180.0 lb

## 2012-02-13 DIAGNOSIS — E785 Hyperlipidemia, unspecified: Secondary | ICD-10-CM

## 2012-02-13 DIAGNOSIS — R7309 Other abnormal glucose: Secondary | ICD-10-CM

## 2012-02-13 DIAGNOSIS — C211 Malignant neoplasm of anal canal: Secondary | ICD-10-CM

## 2012-02-13 DIAGNOSIS — K739 Chronic hepatitis, unspecified: Secondary | ICD-10-CM

## 2012-02-13 DIAGNOSIS — D649 Anemia, unspecified: Secondary | ICD-10-CM

## 2012-02-13 DIAGNOSIS — K5730 Diverticulosis of large intestine without perforation or abscess without bleeding: Secondary | ICD-10-CM

## 2012-02-13 DIAGNOSIS — I059 Rheumatic mitral valve disease, unspecified: Secondary | ICD-10-CM

## 2012-02-13 DIAGNOSIS — Z23 Encounter for immunization: Secondary | ICD-10-CM

## 2012-02-13 DIAGNOSIS — M949 Disorder of cartilage, unspecified: Secondary | ICD-10-CM

## 2012-02-13 DIAGNOSIS — I10 Essential (primary) hypertension: Secondary | ICD-10-CM

## 2012-02-13 DIAGNOSIS — F411 Generalized anxiety disorder: Secondary | ICD-10-CM

## 2012-02-13 DIAGNOSIS — E041 Nontoxic single thyroid nodule: Secondary | ICD-10-CM

## 2012-02-13 LAB — CBC WITH DIFFERENTIAL/PLATELET
Basophils Absolute: 0 10*3/uL (ref 0.0–0.1)
Eosinophils Relative: 2 % (ref 0.0–5.0)
HCT: 39 % (ref 36.0–46.0)
Lymphs Abs: 1 10*3/uL (ref 0.7–4.0)
MCV: 91 fl (ref 78.0–100.0)
Monocytes Absolute: 0.4 10*3/uL (ref 0.1–1.0)
Monocytes Relative: 9 % (ref 3.0–12.0)
Neutrophils Relative %: 63.1 % (ref 43.0–77.0)
Platelets: 228 10*3/uL (ref 150.0–400.0)
RDW: 13.1 % (ref 11.5–14.6)
WBC: 3.9 10*3/uL — ABNORMAL LOW (ref 4.5–10.5)

## 2012-02-13 LAB — BASIC METABOLIC PANEL
BUN: 13 mg/dL (ref 6–23)
Chloride: 105 mEq/L (ref 96–112)
Glucose, Bld: 122 mg/dL — ABNORMAL HIGH (ref 70–99)
Potassium: 4.6 mEq/L (ref 3.5–5.1)
Sodium: 141 mEq/L (ref 135–145)

## 2012-02-13 LAB — TSH: TSH: 1.55 u[IU]/mL (ref 0.35–5.50)

## 2012-02-13 LAB — HEPATIC FUNCTION PANEL
ALT: 25 U/L (ref 0–35)
AST: 38 U/L — ABNORMAL HIGH (ref 0–37)
Albumin: 4 g/dL (ref 3.5–5.2)
Alkaline Phosphatase: 62 U/L (ref 39–117)

## 2012-02-13 LAB — HEMOGLOBIN A1C: Hgb A1c MFr Bld: 5.4 % (ref 4.6–6.5)

## 2012-02-13 MED ORDER — FUROSEMIDE 20 MG PO TABS: 20.0000 mg | ORAL_TABLET | Freq: Every day | ORAL | Status: DC

## 2012-02-13 MED ORDER — LISINOPRIL 20 MG PO TABS: 20.0000 mg | ORAL_TABLET | Freq: Every day | ORAL | Status: DC

## 2012-02-13 MED ORDER — POTASSIUM CHLORIDE 20 MEQ PO PACK: 20.0000 meq | PACK | Freq: Every day | ORAL | Status: DC

## 2012-02-13 MED ORDER — ATENOLOL 50 MG PO TABS: 50.0000 mg | ORAL_TABLET | Freq: Every day | ORAL | Status: DC

## 2012-02-13 NOTE — Patient Instructions (Signed)
Today we updated your med list in our EPIC system...    Continue your current medications the same...  Today we gave you the combination Tetanus. Pertussis vaccine called the TDAP (it is good for 10 yrs)...  Today we did your follow up FASTING blood work...    We will call you w/ the results when avail...  We will also sched an ultrasound exam of your Thyroid gland to evaluate the nodule we felt on physical exam today...  Call for any questions...  Let's continue our 6 month follow up visits.Marland KitchenMarland Kitchen

## 2012-02-13 NOTE — Progress Notes (Signed)
Subjective:    Patient ID: Sharon Holt, female    DOB: [birth year], 72 y.o.   MRN: [id number]  HPI 71 y/o BF here for a follow up visit... she has mult med problems including HBP;  ?of MVP w/ palpit;  Hyperlipidemia;  Borderline BS;  hx SqCellCa of anal canal;  Granulomatous Hepatitis;  Osteopenia;  & Anxiety...  ~  December 26, 2010:  349mo ROV- doing well overall w/o new complaints or concerns... she requests refill for Chlorazepate.    BP is sl elevated at 150/90 range despite Aten & Lasix Rx & she is not checking it often at home;  we decided to add Lisinopril 20mg /d to her regimen & gave her a BP cuff to use for home monitoring;  she denioes CP, palpit, SOB, edema, etc;  CXR is clear w/ norm heart size; and EKG w/ NSR, WNL...    Lipids controlled on diet + exerc w/ FLP today ==> TChol 209, TG 115, HDL 66, LDL 109...    FBS remains sl elev at 129 today on diet & 5# wt loss to 188#;  we reviewed low carb diet + exerc...    Hx anal cancer- s/p XRT & chemoRx> no known recurrence; due for f/u colonoscopy 10/12 per DrPatterson...    Hx granulomatous hepatitis (prob sarcoid) on sm dose Pred w/ good control & she has been reluctant to decr further; currently on Pred5mg - 1/2 tab daily & I suggested she could decr further to Qod if she wanted to; LFT's on todays labs remain WNL...    She remains on Evista, Calcium, MVI, Vit D for her osteopenia followed by GYN DrDickstein who has just retired.  ~  March 29, 2011:  449mo ROV & f/u BP on 3 med regimen- Atenolol, Lisinopril, Lasix> her home checks have been excellent in the 120-130/ 70-80 range & she is feeling well... Requests refill of her chlorazepate rx... We reviewed her other problems as well & decided to decr her low dose Pred for her granulomatous hepatitis to 1/2 Qod...  ~  June 30, 2011:  449mo ROV since we decr her Pred to 5mg - 1/2 Qod; clinically stable- no abd pain, no N&V, etc; her only c/o ?dumping syndrome w/ urge to defecate immed after  eating & this is a prob for her when dining out; she has hx SqCellCa of anal canal treated w/ XRT & ChemoRx in 2003- due for f/u by DrPatterson now & we will refer (in the meanwhile she will try taking a suppository to empty rectum before she dines out)... NOTE: BMet & LFTs look ok on Pred 2.5mg Qod.    BP controlled on 3 med regimen, measures 150/80 today & better at home she says...    Chol has been looking pretty good on diet alone & her weight is down 8# to 180# w/ walking, keep up the good work...    Other medical problems as noted below...  ~  February 13, 2012:  83mo ROV & Sharon Holt is doing well, no new complaints or concerns; we reviewed her problem list, medications, xrays & labs> see below>>    Exam showed palp thyroid & Sonar reveals multinod goiter w/ 2 dom nodules on left> we will order needle Bx to r/o cancer & plan f/u sonar in 349mo...    She had f/u colonoscopy by DrPatterson 10/12 w/ divertics only, no recurrent polyps & he plans f/u again in 12yrs... LABS 4/13:  FLP- ok on diet alone;  Chems- ok x  BS=122 A1c=5.4;  CBC- wnl;  TSH=1.55... Thyroid Ultrasound> multinodular goiter, 2 dominant nodules on left & one large cyst==> proceed w/ needle bx & if benign we will recheck sonar in 42mo... Needle Bx of dominant Thyroid nodule by IR ==> pending          Problem List:  HYPERTENSION (ICD-401.9) - on TENORMIN 50mg /d, LISINOPRIL 20mg /d, LASIX 20mg /d & K20/d... ~  3/12:  we decided to add LISINOPRIL 20mg /d to her Aten50 & Lasix20... ~  6/12:  BP looks great on this 3 med regimen, 132/82 today & similar at home... ~  9/12:  BP=132/82 today, & similar at home... she denies HA, fatigue, visual changes, CP, palipit, dizziness, syncope, dyspnea, edema, etc... ~  4/13:  BP= 146/80 & she denies HA, CP, palpit, SOB, edema...  ? of MITRAL VALVE PROLAPSE (ICD-424.0) - asymptomatic: no CP or palpit recently... ~  2DEcho 1/96 showed WNL- no MVP seen, norm LVF w/ EF=55-60%...  PALPITATIONS (ICD-785.1)  - no recent symptoms (controlled w/ meds)...  HYPERLIPIDEMIA, BORDERLINE (ICD-272.4) - on diet alone + Fish Oil... ~  FLP 3/08 showed TChol 168, TG 73, HDL 56, LDL 97... weight 193#, continue diet Rx... ~  FLP 3/09 showed TChol 210, TG 148, HDL 62, LDL 105... weight 196#, needs better diet efforts. ~  FLP 9/09 showed TChol 169, TG 115, HDL 68, LDL 79... wt=195#.Marland KitchenMarland Kitchen contin diet efforts. ~  FLP 3/10 showed TChol 175, TG 91, HDL 55, LDL 102... wt=196#... rec- wt loss. ~  FLP 3/11 showed TChol 195, TG 116, HDL 77, LDL 95... Wt=191# ~  FLP 3/12 showed TChol 209, TG 115, HDL 66, LDL 109... Wt=188# ~  FLP 4/13 showed TChol 204, TG 147, HDL 66, LDL 101... Wt=180#  Hx of OTHER ABNORMAL GLUCOSE (Impaired Fasting Gluc) - borderline blood sugars in the past- diet Rx... ~  labs 3/08 showed BS=99, HgA1c=5.6 ~  labs 3/09 showed BS=122, HgA1c=5.6.Marland KitchenMarland Kitchen rec diet + exercise program... ~  labs 9/09 showed BS= 105 ~  labs 3/10 showed BS= 131, HgA1c= 5.6.Marland KitchenMarland Kitchen rec- contin diet efforts, get wt down. ~  labs 9/10 showed BS= 113 ~  labs 3/11 showed BS= 116, A1c= 5.4 ~  Labs 3/12 showed BS= 129 ~  Labs 4/13 showed BS= 122, A1c= 5.4  MULTINODULAR THYROID >>  ~  4/13:  palp thyroid nodule on exam & Sonar performed w/ multinodular goiter, 2 dominant nodules on left & one large cyst==> proceed w/ needle bx & if benign we will recheck sonar in 42mo...  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 ~  Colonoscopy 10/07 by DrPatterson was normal w/o rectal lesions seen, & otherw neg too... f/u planned 40yrs... ~  Follow up colonoscopy 10/12 by DrPatterson showed mild divertics, no polyps, otherw neg- f/u planned 46yrs.  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 she had a needle bx of liver in 1998 showing granulomatous  hepatitis & ACE was 74 (8-52) and she was treated w/ Pred w/ normalization of her LFT's (and ACE level)... we were unable to wean to Qod regimen due to incr LFT's again, but stable on daily Rx- now down to 2.5mg /d. ~  prev LFT's normal 3/08 on Pred 5mg /d... continue same med... ~  labs 3/09 w/ SGOT 43 (0-37), all others WNL.Marland Kitchen. continue 5mg /d... ~  labs  9/09 showed SGOT= 42, others normal... rec- decr 5mg  to 1/2 tab daily... ~  labs 3/10 showed SGOT= 38, others WNL.Marland Kitchen. rec- contin same (2.5mg /d) ~  labs 9/10 showed SGOT= 40, SGPT= 28 ~  labs 3/11 showed SGOT= 39, SGPT= 25... Continue Pred 2.5mg /d ~  Labs 3/12 showed SGOT= 42, SGPT= 26 ~  6/12:  We discussed trying to decr the Pred to 2.5 Qod again... ~  Labs 9/12 showed SGOT= 38, SGPT= 21... Continue Pred 2.5mg Qod. ~  Labs 4/13 showed SGOT= 38, SGPT= 25... Continue Pred2.5mg Qod.  OSTEOPENIA (ICD-733.90) - on EVISTA 60mg /d, ca++, vits, & Vit D 2000 u daily per DrDickstein... ~  last BMD @ [facility] 9/05 showed TScores -0.7 to -1.1 ~  pt reports BMD and Vit D levels by DrDickstein & decr from 50K per wk to 2000u per day.  ANXIETY (ICD-300.00) - on TRANXENE 7.5mg  as needed.  ANEMIA (ICD-285.9) - prev hx of anemia... stabilized in the 11-12 range...  ~  labs 3/10 showed Hg= 12.0 ~  labs 9/10 showed Hg= 13.2 ~  labs 3/11 showed Hg= 12.6 ~  Labs 3/12 showed Hg= 13.5 ~  Labs 4/13 showed Hg= 13.1  Health Maintenance -  ~  GYN/ PAP per DrDickstein... pt reports last seen 9/10 ~  Mammograms at [facility]... done 2/10 & reported neg. ~  Colonoscopy last performed 7/07 by DrPatterson (see above). ~  Immunizations: she gets yearly Flu shots (given 9/11);  given PNEUMOVAX today 3/11- age69;  Had TETANUS ~2007 she thinks & we decided to give her the TDAP 4/13...   Past Surgical History  Procedure Date  . Cholecystectomy   . Tonsillectomy   . Breast lumpectomy     right    Outpatient Encounter Prescriptions as of 02/13/2012  Medication Sig  Dispense Refill  . atenolol (TENORMIN) 50 MG tablet Take 50 mg by mouth daily.        . calcium carbonate (TUMS - DOSED IN MG ELEMENTAL CALCIUM) 500 MG chewable tablet Chew 1 tablet by mouth daily.        . Cholecalciferol (VITAMIN D3) 2000 UNITS capsule Take 2,000 Units by mouth daily.        . clorazepate (TRANXENE) 7.5 MG tablet Take 1/2 to 1 tablet three times a day as needed  90 tablet  5  . fish oil-omega-3 fatty acids 1000 MG capsule Take 1 g by mouth daily.        . furosemide (LASIX) 20 MG tablet Take 20 mg by mouth daily.        . Garlic Oil 1000 MG CAPS Take 1 capsule by mouth daily.        Marland Kitchen lisinopril (PRINIVIL,ZESTRIL) 20 MG tablet Take 20 mg by mouth daily.        . Multiple Vitamins-Minerals (CENTRUM SILVER PO) Take 1 capsule by mouth daily.        . potassium chloride (KLOR-CON) 20 MEQ packet Take 20 mEq by mouth daily.        . predniSONE (DELTASONE) 5 MG tablet Take 2.5 mg by mouth daily.       . raloxifene (EVISTA) 60 MG tablet Take 60 mg by mouth daily.          No Known Allergies   Current Medications, Allergies, Past Medical History, Past Surgical History, Family History, and Social History were reviewed in [employer] record.    Review of Systems        See HPI - all other  systems neg except as noted... The patient denies anorexia, fever, weight loss, weight gain, vision loss, decreased hearing, hoarseness, chest pain, syncope, dyspnea on exertion, peripheral edema, prolonged cough, headaches, hemoptysis, abdominal pain, melena, hematochezia, severe indigestion/heartburn, hematuria, incontinence, muscle weakness, suspicious skin lesions, transient blindness, difficulty walking, depression, unusual weight change, abnormal bleeding, enlarged lymph nodes, and angioedema.     Objective:   Physical Exam     WD, Sl overweight, 71 y/o BF in NAD... GENERAL:  Alert & oriented; pleasant & cooperative... HEENT:  [place]/AT, EOM-wnl, PERRLA, EACs-clear,  TMs-wnl, NOSE-clear, THROAT-clear & wnl. NECK:  Supple w/ fairROM; no JVD; normal carotid impulses w/o bruits; no thyromegaly or nodules palpated; no lymphadenopathy. CHEST:  Clear to P & A; without wheezes/ rales/ or rhonchi. HEART:  Regular Rhythm; without murmurs/ rubs/ or gallops. ABDOMEN:  Soft & nontender; normal bowel sounds; no organomegaly or masses detected. EXT: without deformities or arthritic changes; no varicose veins/ venous insuffic/ or edema. She has good ROM right shoulder... NEURO:  CN's intact; motor testing normal; sensory testing normal; gait normal & balance OK. DERM:  No lesions noted; no rash etc...  RADIOLOGY DATA:  Reviewed in the EPIC EMR & discussed w/ the patient...  LABORATORY DATA:  Reviewed in the EPIC EMR & discussed w/ the patient...   Assessment & Plan:   Granulomatous Hepatitis>  Pred at 2.5mg  Qod & she remains stable, asymptomatic, LFTs ok, continue same...  Hx of Anal Cancer> followed by DrPatterson, DrDickstein, DrStreck, DrLivesay; f/u eval scope by DrPatterson 10/12 was neg...   HBP>  Stable on 3 med regimen, continue same...  CHOL>  Stable on diet + Fish Oil, continue same...  Multinodular Thyroid>  Sonar w/ multinod gland but 2 dom nodules in left upper pole for Bx to r/o malig & we will plan f/u sonar in 6 mo...  Impaired Fasting Gluc>  Stable on diet alone & needs to get wt down!  Osteopenia>  Remains on Evista, Calcium, MVI, Vit D...  Anxiety>  On Chlorazepate as needed...   Patient's Medications  New Prescriptions   No medications on file  Previous Medications   CALCIUM CARBONATE (TUMS - DOSED IN MG ELEMENTAL CALCIUM) 500 MG CHEWABLE TABLET    Chew 1 tablet by mouth daily.     CHOLECALCIFEROL (VITAMIN D3) 2000 UNITS CAPSULE    Take 2,000 Units by mouth daily.     CLORAZEPATE (TRANXENE) 7.5 MG TABLET    Take 1/2 to 1 tablet three times a day as needed   FISH OIL-OMEGA-3 FATTY ACIDS 1000 MG CAPSULE    Take 1 g by mouth daily.       GARLIC OIL 1000 MG CAPS    Take 1 capsule by mouth daily.     MULTIPLE VITAMINS-MINERALS (CENTRUM SILVER PO)    Take 1 capsule by mouth daily.     PREDNISONE (DELTASONE) 5 MG TABLET    Take 2.5 mg by mouth daily.    RALOXIFENE (EVISTA) 60 MG TABLET    Take 60 mg by mouth daily.    Modified Medications   Modified Medication Previous Medication   ATENOLOL (TENORMIN) 50 MG TABLET atenolol (TENORMIN) 50 MG tablet      Take 1 tablet (50 mg total) by mouth daily.    Take 50 mg by mouth daily.     FUROSEMIDE (LASIX) 20 MG TABLET furosemide (LASIX) 20 MG tablet      Take 1 tablet (20 mg total) by mouth daily.    Take  20 mg by mouth daily.     LISINOPRIL (PRINIVIL,ZESTRIL) 20 MG TABLET lisinopril (PRINIVIL,ZESTRIL) 20 MG tablet      Take 1 tablet (20 mg total) by mouth daily.    Take 20 mg by mouth daily.     POTASSIUM CHLORIDE (KLOR-CON) 20 MEQ PACKET potassium chloride (KLOR-CON) 20 MEQ packet      Take 20 mEq by mouth daily.    Take 20 mEq by mouth daily.    Discontinued Medications   No medications on file

## 2012-02-14 ENCOUNTER — Ambulatory Visit
Admission: RE | Admit: 2012-02-14 | Discharge: 2012-02-14 | Disposition: A | Discharge status: Home / Self Care | Payer: Medicare PPO | Source: Ambulatory Visit | Attending: Pulmonary Disease | Admitting: Pulmonary Disease

## 2012-02-14 DIAGNOSIS — E041 Nontoxic single thyroid nodule: Secondary | ICD-10-CM

## 2012-02-26 ENCOUNTER — Telehealth: Payer: Self-pay | Admitting: Pulmonary Disease

## 2012-02-26 ENCOUNTER — Other Ambulatory Visit: Payer: Self-pay | Admitting: Pulmonary Disease

## 2012-02-26 DIAGNOSIS — E041 Nontoxic single thyroid nodule: Secondary | ICD-10-CM

## 2012-02-26 NOTE — Telephone Encounter (Signed)
lmomtcb for tammy 

## 2012-02-26 NOTE — Telephone Encounter (Signed)
Order has been placed for pt again for a bx from a second nodule.  tammy is aware that this will be faxed to her .

## 2012-02-26 NOTE — Telephone Encounter (Signed)
Called and spoke with pt about her results.

## 2012-02-26 NOTE — Telephone Encounter (Signed)
Leigh did you try to call this patient? Please advise.Carron Curie, CMA

## 2012-02-27 ENCOUNTER — Other Ambulatory Visit (HOSPITAL_COMMUNITY)
Admission: RE | Admit: 2012-02-27 | Discharge: 2012-02-27 | Disposition: A | Discharge status: Home / Self Care | Payer: Medicare PPO | Source: Ambulatory Visit | Attending: Interventional Radiology | Admitting: Interventional Radiology

## 2012-02-27 ENCOUNTER — Ambulatory Visit
Admission: RE | Admit: 2012-02-27 | Discharge: 2012-02-27 | Disposition: A | Discharge status: Home / Self Care | Payer: Medicare PPO | Source: Ambulatory Visit | Attending: Pulmonary Disease | Admitting: Pulmonary Disease

## 2012-02-27 DIAGNOSIS — E041 Nontoxic single thyroid nodule: Secondary | ICD-10-CM

## 2012-02-28 ENCOUNTER — Other Ambulatory Visit: Payer: Medicare PPO

## 2012-03-01 ENCOUNTER — Other Ambulatory Visit: Payer: Self-pay | Admitting: Pulmonary Disease

## 2012-05-30 ENCOUNTER — Encounter: Payer: Self-pay | Admitting: Pulmonary Disease

## 2012-05-31 ENCOUNTER — Other Ambulatory Visit: Payer: Self-pay | Admitting: Pulmonary Disease

## 2012-06-05 ENCOUNTER — Other Ambulatory Visit: Payer: Self-pay | Admitting: Pulmonary Disease

## 2012-08-14 ENCOUNTER — Encounter: Payer: Self-pay | Admitting: *Deleted

## 2012-08-15 ENCOUNTER — Ambulatory Visit (INDEPENDENT_AMBULATORY_CARE_PROVIDER_SITE_OTHER): Payer: Medicare PPO | Admitting: Pulmonary Disease

## 2012-08-15 ENCOUNTER — Other Ambulatory Visit (INDEPENDENT_AMBULATORY_CARE_PROVIDER_SITE_OTHER): Payer: Medicare PPO

## 2012-08-15 ENCOUNTER — Encounter: Payer: Self-pay | Admitting: Pulmonary Disease

## 2012-08-15 VITALS — BP 124/82 | HR 96 | Temp 96.8°F | Ht 65.0 in | Wt 183.6 lb

## 2012-08-15 DIAGNOSIS — K5730 Diverticulosis of large intestine without perforation or abscess without bleeding: Secondary | ICD-10-CM

## 2012-08-15 DIAGNOSIS — E041 Nontoxic single thyroid nodule: Secondary | ICD-10-CM

## 2012-08-15 DIAGNOSIS — R7309 Other abnormal glucose: Secondary | ICD-10-CM

## 2012-08-15 DIAGNOSIS — I059 Rheumatic mitral valve disease, unspecified: Secondary | ICD-10-CM

## 2012-08-15 DIAGNOSIS — K739 Chronic hepatitis, unspecified: Secondary | ICD-10-CM

## 2012-08-15 DIAGNOSIS — I10 Essential (primary) hypertension: Secondary | ICD-10-CM

## 2012-08-15 DIAGNOSIS — F411 Generalized anxiety disorder: Secondary | ICD-10-CM

## 2012-08-15 DIAGNOSIS — M899 Disorder of bone, unspecified: Secondary | ICD-10-CM

## 2012-08-15 DIAGNOSIS — E785 Hyperlipidemia, unspecified: Secondary | ICD-10-CM

## 2012-08-15 DIAGNOSIS — Z23 Encounter for immunization: Secondary | ICD-10-CM

## 2012-08-15 DIAGNOSIS — C211 Malignant neoplasm of anal canal: Secondary | ICD-10-CM

## 2012-08-15 LAB — BASIC METABOLIC PANEL
CO2: 28 mEq/L (ref 19–32)
Calcium: 9.3 mg/dL (ref 8.4–10.5)
Creatinine, Ser: 0.8 mg/dL (ref 0.4–1.2)
GFR: 73.13 mL/min (ref >60.00)
Sodium: 139 mEq/L (ref 135–145)

## 2012-08-15 LAB — TSH: TSH: 1.5 u[IU]/mL (ref 0.35–5.50)

## 2012-08-15 MED ORDER — PREDNISONE 5 MG PO TABS: 2.5000 mg | ORAL_TABLET | Freq: Every day | ORAL | Status: DC

## 2012-08-15 MED ORDER — CLORAZEPATE DIPOTASSIUM 7.5 MG PO TABS: ORAL_TABLET | ORAL | Status: DC

## 2012-08-15 NOTE — Patient Instructions (Addendum)
Today we updated your med list in our EPIC system...    Continue your current medications the same...  Today we did the needed follow up lab work...    And we will schedule a follow up Thyroid ultrasound test...    We will call you w/ the results when avail...  Today we also gave you the 2013 Flu vaccine...  Call for any questions...  Let's plan a follow up visit in 6 months.Marland KitchenMarland Kitchen

## 2012-08-15 NOTE — Progress Notes (Signed)
Subjective:    Patient ID: Sharon Holt, female    DOB: [birth year], 72 y.o.   MRN: [id number]  HPI 71 y/o BF here for a follow up visit... she has mult med problems including HBP;  ?of MVP w/ palpit;  Hyperlipidemia;  Borderline BS;  hx SqCellCa of anal canal;  Granulomatous Hepatitis;  Osteopenia;  & Anxiety...  ~  December 26, 2010:  349mo ROV- doing well overall w/o new complaints or concerns... she requests refill for Chlorazepate.    BP is sl elevated at 150/90 range despite Aten & Lasix Rx & she is not checking it often at home;  we decided to add Lisinopril 20mg /d to her regimen & gave her a BP cuff to use for home monitoring;  she denioes CP, palpit, SOB, edema, etc;  CXR is clear w/ norm heart size; and EKG w/ NSR, WNL...    Lipids controlled on diet + exerc w/ FLP today ==> TChol 209, TG 115, HDL 66, LDL 109...    FBS remains sl elev at 129 today on diet & 5# wt loss to 188#;  we reviewed low carb diet + exerc...    Hx anal cancer- s/p XRT & chemoRx> no known recurrence; due for f/u colonoscopy 10/12 per DrPatterson...    Hx granulomatous hepatitis (prob sarcoid) on sm dose Pred w/ good control & she has been reluctant to decr further; currently on Pred5mg - 1/2 tab daily & I suggested she could decr further to Qod if she wanted to; LFT's on todays labs remain WNL...    She remains on Evista, Calcium, MVI, Vit D for her osteopenia followed by GYN DrDickstein who has just retired.  ~  March 29, 2011:  449mo ROV & f/u BP on 3 med regimen- Atenolol, Lisinopril, Lasix> her home checks have been excellent in the 120-130/ 70-80 range & she is feeling well... Requests refill of her chlorazepate rx... We reviewed her other problems as well & decided to decr her low dose Pred for her granulomatous hepatitis to 1/2 Qod...  ~  June 30, 2011:  449mo ROV since we decr her Pred to 5mg - 1/2 Qod; clinically stable- no abd pain, no N&V, etc; her only c/o ?dumping syndrome w/ urge to defecate immed after  eating & this is a prob for her when dining out; she has hx SqCellCa of anal canal treated w/ XRT & ChemoRx in 2003- due for f/u by DrPatterson now & we will refer (in the meanwhile she will try taking a suppository to empty rectum before she dines out)... NOTE: BMet & LFTs look ok on Pred 2.5mg Qod.    BP controlled on 3 med regimen, measures 150/80 today & better at home she says...    Chol has been looking pretty good on diet alone & her weight is down 8# to 180# w/ walking, keep up the good work...    Other medical problems as noted below...  ~  February 13, 2012:  83mo ROV & Sharon Holt is doing well, no new complaints or concerns; we reviewed her problem list, medications, xrays & labs> see below>>    Exam showed palp thyroid & Sonar reveals multinod goiter w/ 2 dom nodules on left> we will order needle Bx to r/o cancer & plan f/u sonar in 349mo...    She had f/u colonoscopy by DrPatterson 10/12 w/ divertics only, no recurrent polyps & he plans f/u again in 12yrs... LABS 4/13:  FLP- ok on diet alone;  Chems- ok x  BS=122 A1c=5.4;  CBC- wnl;  TSH=1.55... Thyroid Ultrasound> multinodular goiter, 2 dominant nodules on left & one large cyst==> proceed w/ needle bx & if benign we will recheck sonar in 2mo... Needle Bx of dominant Thyroid nodule by IR ==> pending  ~  August 15, 2012:  2mo ROV & Sharon Holt has had a good 2mo interval w/o new complaints or concerns...    HBP> on Aten50, Lisin20, Lasix20, K20; BP= 124/82 & she denies HA, CP, palpit, SOB, swelling, etc...    Hyperlipid> on diet alone; wt=184# (up 4#); last FLP looked ok but needs to lose weight!    Borderline DM> on diet alone; labs 4/13 showed BS=122, A1c=5.4; continue diet & get weight down...    Multinod Thyroid> eval 4/13 showed multinod gland & bx of dom nodule on left was benign; exam is unchanged & f/u sonar showed similar findings w/ dom left lobe nodules sl smaller in size...    Hx Anal Cancer> she had XRT & ChemoRx in 2003 & no known  recurrence...    Granulomatous Hepatitis> LFTs have been normal on low dose Pred- 2.5mg Qod...    DJD, Osteop> on Evista & supplements, doing well...    Anxiety> on Chlorazepate as needed... We reviewed prob list, meds, xrays and labs> see below for updates >> OK 2013 Flu vaccine today... Thyroid Ultrasound:  Multinod gland- dom nodules on left are sl smaller; smaller nodules on right are sl larger; prev cyst in isthmus is gone... LABS 10/13:  Chems- ok w/ BS=106;  TSH=1.50          Problem List:  HYPERTENSION (ICD-401.9) - on TENORMIN 50mg /d, LISINOPRIL 20mg /d, LASIX 20mg /d & K20/d... ~  3/12:  we decided to add LISINOPRIL 20mg /d to her Aten50 & Lasix20... ~  6/12:  BP looks great on this 3 med regimen, 132/82 today & similar at home... ~  9/12:  BP=132/82 today, & similar at home... she denies HA, fatigue, visual changes, CP, palipit, dizziness, syncope, dyspnea, edema, etc... ~  4/13:  BP= 146/80 & she denies HA, CP, palpit, SOB, edema...  ? of MITRAL VALVE PROLAPSE (ICD-424.0) - asymptomatic: no CP or palpit recently... ~  2DEcho 1/96 showed WNL- no MVP seen, norm LVF w/ EF=55-60%...  PALPITATIONS (ICD-785.1) - no recent symptoms (controlled w/ meds)...  HYPERLIPIDEMIA, BORDERLINE (ICD-272.4) - on diet alone + Fish Oil... ~  FLP 3/08 showed TChol 168, TG 73, HDL 56, LDL 97... weight 193#, continue diet Rx... ~  FLP 3/09 showed TChol 210, TG 148, HDL 62, LDL 105... weight 196#, needs better diet efforts. ~  FLP 9/09 showed TChol 169, TG 115, HDL 68, LDL 79... wt=195#.Marland KitchenMarland Kitchen contin diet efforts. ~  FLP 3/10 showed TChol 175, TG 91, HDL 55, LDL 102... wt=196#... rec- wt loss. ~  FLP 3/11 showed TChol 195, TG 116, HDL 77, LDL 95... Wt=191# ~  FLP 3/12 showed TChol 209, TG 115, HDL 66, LDL 109... Wt=188# ~  FLP 4/13 showed TChol 204, TG 147, HDL 66, LDL 101... Wt=180#  Hx of OTHER ABNORMAL GLUCOSE (Impaired Fasting Gluc) - borderline blood sugars in the past- diet Rx... ~  labs 3/08 showed  BS=99, HgA1c=5.6 ~  labs 3/09 showed BS=122, HgA1c=5.6.Marland KitchenMarland Kitchen rec diet + exercise program... ~  labs 9/09 showed BS= 105 ~  labs 3/10 showed BS= 131, HgA1c= 5.6.Marland KitchenMarland Kitchen rec- contin diet efforts, get wt down. ~  labs 9/10 showed BS= 113 ~  labs 3/11 showed BS= 116, A1c= 5.4 ~  Labs 3/12 showed  BS= 129 ~  Labs 4/13 showed BS= 122, A1c= 5.4  MULTINODULAR THYROID >>  ~  4/13:  palp thyroid nodule on exam & Sonar performed w/ multinodular goiter, 2 dominant nodules on left & one large cyst==> proceed w/ needle bx & if benign we will recheck sonar in 12mo==> Bx= benign thyroid nodule... 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 ~  Colonoscopy 10/07 by DrPatterson was normal w/o rectal lesions seen, & otherw neg too... f/u planned 46yrs... ~  Follow up colonoscopy 10/12 by DrPatterson showed mild divertics, no polyps, otherw neg- f/u planned 4yrs.  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 she had a needle bx of liver in 1998 showing granulomatous hepatitis & ACE was 74 (8-52) and she was treated w/ Pred w/ normalization of her LFT's (and ACE level)... we were unable to wean to Qod regimen due to incr LFT's again, but stable on daily Rx- now down to 2.5mg /d. ~  prev LFT's normal 3/08 on Pred 5mg /d... continue same med... ~  labs 3/09 w/ SGOT 43 (0-37), all others WNL.Marland Kitchen. continue 5mg /d... ~  labs 9/09 showed SGOT= 42, others normal... rec- decr 5mg  to 1/2 tab daily... ~  labs 3/10 showed SGOT= 38, others WNL.Marland Kitchen. rec- contin same (2.5mg /d) ~  labs 9/10 showed SGOT= 40, SGPT= 28 ~  labs 3/11 showed SGOT= 39, SGPT= 25... Continue Pred 2.5mg /d ~  Labs 3/12 showed  SGOT= 42, SGPT= 26 ~  6/12:  We discussed trying to decr the Pred to 2.5 Qod again... ~  Labs 9/12 showed SGOT= 38, SGPT= 21... Continue Pred 2.5mg Qod. ~  Labs 4/13 showed SGOT= 38, SGPT= 25... Continue Pred2.5mg Qod.  OSTEOPENIA (ICD-733.90) - on EVISTA 60mg /d, ca++, vits, & Vit D 2000 u daily per DrDickstein... ~  last BMD @ [facility] 9/05 showed TScores -0.7 to -1.1 ~  pt reports BMD and Vit D levels by DrDickstein & decr from 50K per wk to 2000u per day.  ANXIETY (ICD-300.00) - on TRANXENE 7.5mg  as needed.  ANEMIA (ICD-285.9) - prev hx of anemia... stabilized in the 11-12 range...  ~  labs 3/10 showed Hg= 12.0 ~  labs 9/10 showed Hg= 13.2 ~  labs 3/11 showed Hg= 12.6 ~  Labs 3/12 showed Hg= 13.5 ~  Labs 4/13 showed Hg= 13.1  Health Maintenance -  ~  GYN/ PAP per DrDickstein... pt reports last seen 9/10 ~  [facility] at [facility]... done 2/10 & reported neg. ~  Colonoscopy last performed 7/07 by DrPatterson (see above). ~  Immunizations: she gets yearly Flu shots (given 9/11);  given PNEUMOVAX today 3/11- age69;  Had TETANUS ~2007 she thinks & we decided to give her the TDAP 4/13...   Past Surgical History  Procedure Date  . Cholecystectomy   . Tonsillectomy   . Breast lumpectomy     right    Outpatient Encounter Prescriptions as of 08/15/2012  Medication Sig Dispense Refill  . atenolol (TENORMIN) 50 MG tablet TAKE 1 TABLET BY MOUTH ONCE DAILY  90 tablet  5  . calcium carbonate (TUMS - DOSED IN MG ELEMENTAL CALCIUM) 500 MG chewable tablet Chew 1 tablet by mouth daily.        Marland Kitchen  Cholecalciferol (VITAMIN D3) 2000 UNITS capsule Take 2,000 Units by mouth daily.        . clorazepate (TRANXENE) 7.5 MG tablet Take 1/2 to 1 tablet three times a day as needed  90 tablet  5  . fish oil-omega-3 fatty acids 1000 MG capsule Take 1 g by mouth daily.        . furosemide (LASIX) 20 MG tablet TAKE 1 TABLET ONE TIME DAILY  90 tablet  PRN  . Garlic Oil 1000 MG CAPS Take 1 capsule by mouth  daily.        Marland Kitchen lisinopril (PRINIVIL,ZESTRIL) 20 MG tablet take 1 tablet by mouth once daily  90 tablet  4  . Multiple Vitamins-Minerals (CENTRUM SILVER PO) Take 1 capsule by mouth daily.        . potassium chloride SA (K-DUR,KLOR-CON) 20 MEQ tablet Take 20 mEq by mouth daily.      . predniSONE (DELTASONE) 5 MG tablet Take 2.5 mg by mouth daily.       . raloxifene (EVISTA) 60 MG tablet Take 60 mg by mouth daily.        Marland Kitchen DISCONTD: potassium chloride (KLOR-CON) 20 MEQ packet Take 20 mEq by mouth daily.  90 tablet  3    No Known Allergies   Current Medications, Allergies, Past Medical History, Past Surgical History, Family History, and Social History were reviewed in [employer] record.    Review of Systems        See HPI - all other systems neg except as noted... The patient denies anorexia, fever, weight loss, weight gain, vision loss, decreased hearing, hoarseness, chest pain, syncope, dyspnea on exertion, peripheral edema, prolonged cough, headaches, hemoptysis, abdominal pain, melena, hematochezia, severe indigestion/heartburn, hematuria, incontinence, muscle weakness, suspicious skin lesions, transient blindness, difficulty walking, depression, unusual weight change, abnormal bleeding, enlarged lymph nodes, and angioedema.     Objective:   Physical Exam     WD, Sl overweight, 71 y/o BF in NAD... GENERAL:  Alert & oriented; pleasant & cooperative... HEENT:  [place]/AT, EOM-wnl, PERRLA, EACs-clear, TMs-wnl, NOSE-clear, THROAT-clear & wnl. NECK:  Supple w/ fairROM; no JVD; normal carotid impulses w/o bruits; no thyromegaly or nodules palpated; no lymphadenopathy. CHEST:  Clear to P & A; without wheezes/ rales/ or rhonchi. HEART:  Regular Rhythm; without murmurs/ rubs/ or gallops. ABDOMEN:  Soft & nontender; normal bowel sounds; no organomegaly or masses detected. EXT: without deformities or arthritic changes; no varicose veins/ venous insuffic/ or edema. She has  good ROM right shoulder... NEURO:  CN's intact; motor testing normal; sensory testing normal; gait normal & balance OK. DERM:  No lesions noted; no rash etc...  RADIOLOGY DATA:  Reviewed in the EPIC EMR & discussed w/ the patient...  LABORATORY DATA:  Reviewed in the EPIC EMR & discussed w/ the patient...   Assessment & Plan:    HBP>  Stable on 3 med regimen, continue same...  CHOL>  Stable on diet + Fish Oil, continue same...  Multinodular Thyroid>  Sonar w/ multinod gland but 2 dom nodules in left- Bx was benign 4/13;  Sonar 10/13 shows these nodules smaller; TFTs wnl...  Impaired Fasting Gluc>  Stable on diet alone & needs to get wt down!  Granulomatous Hepatitis>  Pred at 2.5mg  Qod & she remains stable, asymptomatic, LFTs ok, continue same...  Hx of Anal Cancer> followed by DrPatterson, DrDickstein, DrStreck, DrLivesay; f/u eval scope by DrPatterson 10/12 was neg...  Osteopenia>  Remains on Evista, Calcium, MVI, Vit  D...  Anxiety>  On Chlorazepate as needed...   Patient's Medications  New Prescriptions   No medications on file  Previous Medications   ATENOLOL (TENORMIN) 50 MG TABLET    TAKE 1 TABLET BY MOUTH ONCE DAILY   CALCIUM CARBONATE (TUMS - DOSED IN MG ELEMENTAL CALCIUM) 500 MG CHEWABLE TABLET    Chew 1 tablet by mouth daily.     CHOLECALCIFEROL (VITAMIN D3) 2000 UNITS CAPSULE    Take 2,000 Units by mouth daily.     FISH OIL-OMEGA-3 FATTY ACIDS 1000 MG CAPSULE    Take 1 g by mouth daily.     FUROSEMIDE (LASIX) 20 MG TABLET    TAKE 1 TABLET ONE TIME DAILY   GARLIC OIL 1000 MG CAPS    Take 1 capsule by mouth daily.     LISINOPRIL (PRINIVIL,ZESTRIL) 20 MG TABLET    take 1 tablet by mouth once daily   MULTIPLE VITAMINS-MINERALS (CENTRUM SILVER PO)    Take 1 capsule by mouth daily.     POTASSIUM CHLORIDE SA (K-DUR,KLOR-CON) 20 MEQ TABLET    Take 20 mEq by mouth daily.   RALOXIFENE (EVISTA) 60 MG TABLET    Take 60 mg by mouth daily.    Modified Medications   Modified  Medication Previous Medication   CLORAZEPATE (TRANXENE) 7.5 MG TABLET clorazepate (TRANXENE) 7.5 MG tablet      Take 1/2 to 1 tablet three times a day as needed    Take 1/2 to 1 tablet three times a day as needed   PREDNISONE (DELTASONE) 5 MG TABLET predniSONE (DELTASONE) 5 MG tablet      Take 0.5 tablets (2.5 mg total) by mouth daily.    Take 2.5 mg by mouth daily.   Discontinued Medications   POTASSIUM CHLORIDE (KLOR-CON) 20 MEQ PACKET    Take 20 mEq by mouth daily.

## 2012-08-16 ENCOUNTER — Ambulatory Visit (HOSPITAL_COMMUNITY)
Admission: RE | Admit: 2012-08-16 | Discharge: 2012-08-16 | Disposition: A | Discharge status: Home / Self Care | Payer: Medicare PPO | Source: Ambulatory Visit | Attending: Pulmonary Disease | Admitting: Pulmonary Disease

## 2012-08-16 DIAGNOSIS — E041 Nontoxic single thyroid nodule: Secondary | ICD-10-CM

## 2012-09-30 ENCOUNTER — Other Ambulatory Visit: Payer: Self-pay | Admitting: Pulmonary Disease

## 2012-09-30 MED ORDER — POTASSIUM CHLORIDE CRYS ER 20 MEQ PO TBCR: 20.0000 meq | EXTENDED_RELEASE_TABLET | Freq: Every day | ORAL | Status: DC

## 2012-09-30 NOTE — Telephone Encounter (Signed)
Refill sent to the pharmacy 

## 2013-02-13 ENCOUNTER — Ambulatory Visit (INDEPENDENT_AMBULATORY_CARE_PROVIDER_SITE_OTHER): Payer: Medicare PPO | Admitting: Pulmonary Disease

## 2013-02-13 ENCOUNTER — Other Ambulatory Visit (INDEPENDENT_AMBULATORY_CARE_PROVIDER_SITE_OTHER): Payer: Medicare PPO

## 2013-02-13 ENCOUNTER — Encounter: Payer: Self-pay | Admitting: Pulmonary Disease

## 2013-02-13 VITALS — BP 146/78 | HR 84 | Temp 98.2°F | Ht 65.0 in | Wt 188.0 lb

## 2013-02-13 DIAGNOSIS — M899 Disorder of bone, unspecified: Secondary | ICD-10-CM

## 2013-02-13 DIAGNOSIS — E785 Hyperlipidemia, unspecified: Secondary | ICD-10-CM

## 2013-02-13 DIAGNOSIS — E041 Nontoxic single thyroid nodule: Secondary | ICD-10-CM

## 2013-02-13 DIAGNOSIS — R7309 Other abnormal glucose: Secondary | ICD-10-CM

## 2013-02-13 DIAGNOSIS — D649 Anemia, unspecified: Secondary | ICD-10-CM

## 2013-02-13 DIAGNOSIS — F411 Generalized anxiety disorder: Secondary | ICD-10-CM

## 2013-02-13 DIAGNOSIS — I10 Essential (primary) hypertension: Secondary | ICD-10-CM

## 2013-02-13 DIAGNOSIS — M949 Disorder of cartilage, unspecified: Secondary | ICD-10-CM

## 2013-02-13 DIAGNOSIS — K739 Chronic hepatitis, unspecified: Secondary | ICD-10-CM

## 2013-02-13 DIAGNOSIS — C211 Malignant neoplasm of anal canal: Secondary | ICD-10-CM

## 2013-02-13 DIAGNOSIS — K5730 Diverticulosis of large intestine without perforation or abscess without bleeding: Secondary | ICD-10-CM

## 2013-02-13 LAB — CBC WITH DIFFERENTIAL/PLATELET
Basophils Relative: 0.4 % (ref 0.0–3.0)
Eosinophils Absolute: 0.1 10*3/uL (ref 0.0–0.7)
Eosinophils Relative: 1.9 % (ref 0.0–5.0)
Lymphocytes Relative: 32.4 % (ref 12.0–46.0)
MCHC: 35.5 g/dL (ref 30.0–36.0)
MCV: 87.2 fl (ref 78.0–100.0)
Monocytes Absolute: 0.5 10*3/uL (ref 0.1–1.0)
Neutrophils Relative %: 54.1 % (ref 43.0–77.0)
Platelets: 214 10*3/uL (ref 150.0–400.0)
RBC: 4.13 Mil/uL (ref 3.87–5.11)
WBC: 4.3 10*3/uL — ABNORMAL LOW (ref 4.5–10.5)

## 2013-02-13 LAB — HEMOGLOBIN A1C: Hgb A1c MFr Bld: 5.4 % (ref 4.6–6.5)

## 2013-02-13 LAB — BASIC METABOLIC PANEL
BUN: 16 mg/dL (ref 6–23)
CO2: 28 mEq/L (ref 19–32)
Chloride: 105 mEq/L (ref 96–112)
Creatinine, Ser: 0.8 mg/dL (ref 0.4–1.2)
Glucose, Bld: 99 mg/dL (ref 70–99)
Potassium: 3.9 mEq/L (ref 3.5–5.1)

## 2013-02-13 LAB — HEPATIC FUNCTION PANEL
ALT: 20 U/L (ref 0–35)
AST: 30 U/L (ref 0–37)
Total Protein: 7.7 g/dL (ref 6.0–8.3)

## 2013-02-13 LAB — TSH: TSH: 0.99 u[IU]/mL (ref 0.35–5.50)

## 2013-02-13 LAB — LIPID PANEL
Cholesterol: 177 mg/dL (ref 0–200)
Triglycerides: 94 mg/dL (ref 0.0–149.0)

## 2013-02-13 MED ORDER — LISINOPRIL 20 MG PO TABS: ORAL_TABLET | ORAL | Status: DC

## 2013-02-13 NOTE — Progress Notes (Signed)
Subjective:    Patient ID: Lyda Jester, female    DOB: [birth year], 72 y.o.   MRN: [id number]  HPI 72 y/o BF here for a follow up visit... she has mult med problems including HBP;  ?of MVP w/ palpit;  Hyperlipidemia;  Borderline BS;  hx SqCellCa of anal canal;  Granulomatous Hepatitis;  Osteopenia;  & Anxiety...  ~  June 30, 2011:  79mo ROV since we decr her Pred to 5mg - 1/2 Qod; clinically stable- no abd pain, no N&V, etc; her only c/o ?dumping syndrome w/ urge to defecate immed after eating & this is a prob for her when dining out; she has hx SqCellCa of anal canal treated w/ XRT & ChemoRx in 2003- due for f/u by DrPatterson now & we will refer (in the meanwhile she will try taking a suppository to empty rectum before she dines out)... NOTE: BMet & LFTs look ok on Pred 2.5mg Qod.    BP controlled on 3 med regimen, measures 150/80 today & better at home she says...    Chol has been looking pretty good on diet alone & her weight is down 8# to 180# w/ walking, keep up the good work...    Other medical problems as noted below...  ~  February 13, 2012:  34mo ROV & Jazmynn is doing well, no new complaints or concerns; we reviewed her problem list, medications, xrays & labs> see below>>    Exam showed palp thyroid & Sonar reveals multinod goiter w/ 2 dom nodules on left> we will order needle Bx to r/o cancer & plan f/u sonar in 49mo...    She had f/u colonoscopy by DrPatterson 10/12 w/ divertics only, no recurrent polyps & he plans f/u again in 57yrs... LABS 4/13:  FLP- ok on diet alone;  Chems- ok x BS=122 A1c=5.4;  CBC- wnl;  TSH=1.55... Thyroid Ultrasound> multinodular goiter, 2 dominant nodules on left & one large cyst==> proceed w/ needle bx & if benign we will recheck sonar in 49mo... Needle Bx of dominant Thyroid nodule by IR ==> pending  ~  August 15, 2012:  49mo ROV & Lianna has had a good 49mo interval w/o new complaints or concerns...    HBP> on Aten50, Lisin20, Lasix20, K20; BP=  124/82 & she denies HA, CP, palpit, SOB, swelling, etc...    Hyperlipid> on diet alone; wt=184# (up 4#); last FLP looked ok but needs to lose weight!    Borderline DM> on diet alone; labs 4/13 showed BS=122, A1c=5.4; continue diet & get weight down...    Multinod Thyroid> eval 4/13 showed multinod gland & bx of dom nodule on left was benign; exam is unchanged & f/u sonar showed similar findings w/ dom left lobe nodules sl smaller in size...    Hx Anal Cancer> she had XRT & ChemoRx in 2003 & no known recurrence...    Granulomatous Hepatitis> LFTs have been normal on low dose Pred- 2.5mg Qod...    DJD, Osteop> on Evista & supplements, doing well...    Anxiety> on Chlorazepate as needed... We reviewed prob list, meds, xrays and labs> see below for updates >> OK 2013 Flu vaccine today... Thyroid Ultrasound:  Multinod gland- dom nodules on left are sl smaller; smaller nodules on right are sl larger; prev cyst in isthmus is gone... LABS 10/13:  Chems- ok w/ BS=106;  TSH=1.50...  ~  May, 1, 2014:  49mo ROV & Leara states she's doing well, good interval w/o new complaints or concerns;  We reviewed the  following medical problems during today's office visit >>     HBP> on Aten50, Lisin20, Lasix20, K20; BP= 146/78 & she denies HA, CP, palpit, SOB, swelling, etc...    Hyperlipid> on diet alone; wt=188# (up 4#); FLP 5/14 shows TChol 177, TG 94, HDL 56, LDL 102 and rec to improve diet, get wt down...    Borderline DM> on diet alone; labs 5/14 showed BS=99, A1c=5.4; continue diet & get weight down...    Multinod Thyroid> eval 4/13 showed multinod gland & bx of dom nodule on left was benign; exam is unchanged & f/u sonar showed similar findings w/ dom left lobe nodules sl smaller in size; TSH=0.99...    Hx Anal Cancer> she had XRT & ChemoRx in 2003 & no known recurrence...    Granulomatous Hepatitis> LFTs have been normal on low dose Pred- 2.5mg Qod; Labs 5/14 showed SGOT=30, SGPT=20...    DJD, Osteop> on Evista  per Gyn & supplements including VitD 5000u daily; 5/14 VitD level = 31 & rec to continue same.    Anxiety> on Chlorazepate as needed. We reviewed prob list, meds, xrays and labs> see below for updates >> Fall screen- neg;  Depression screen- neg;  Meds refilled per request... LABS 5/14:  FLP- ok x LDL=102;  Chems- wnl;  CBC- wnl;  TSH=0.99;  VitD=31...           Problem List:  HYPERTENSION (ICD-401.9) - on TENORMIN 50mg /d, LISINOPRIL 20mg /d, LASIX 20mg /d & K20/d... ~  3/12:  we decided to add LISINOPRIL 20mg /d to her Aten50 & Lasix20... ~  CXR 3/12 showed norm heart size, clear lungs, NAD... ~  6/12:  BP looks great on this 3 med regimen, 132/82 today & similar at home... ~  9/12:  BP=132/82 today, & similar at home... she denies HA, fatigue, visual changes, CP, palipit, dizziness, syncope, dyspnea, edema, etc... ~  4/13:  BP= 146/80 & she denies HA, CP, palpit, SOB, edema... ~  5/14:  on Aten50, Lisin20, Lasix20, K20; BP= 146/78 & she denies HA, CP, palpit, SOB, swelling, etc.  ? of MITRAL VALVE PROLAPSE (ICD-424.0) - asymptomatic: no CP or palpit recently... ~  2DEcho 1/96 showed WNL- no MVP seen, norm LVF w/ EF=55-60%...  PALPITATIONS (ICD-785.1) - no recent symptoms (controlled w/ meds)...  HYPERLIPIDEMIA, BORDERLINE (ICD-272.4) - on diet alone + Fish Oil... ~  FLP 3/08 showed TChol 168, TG 73, HDL 56, LDL 97... weight 193#, continue diet Rx... ~  FLP 3/09 showed TChol 210, TG 148, HDL 62, LDL 105... weight 196#, needs better diet efforts. ~  FLP 9/09 showed TChol 169, TG 115, HDL 68, LDL 79... wt=195#.Marland KitchenMarland Kitchen contin diet efforts. ~  FLP 3/10 showed TChol 175, TG 91, HDL 55, LDL 102... wt=196#... rec- wt loss. ~  FLP 3/11 showed TChol 195, TG 116, HDL 77, LDL 95... Wt=191# ~  FLP 3/12 showed TChol 209, TG 115, HDL 66, LDL 109... Wt=188# ~  FLP 4/13 showed TChol 204, TG 147, HDL 66, LDL 101... Wt=180# ~  FLP 5/14 on diet alone showed TChol 177, TG 94, HDL 56, LDL 102... Wt=188#  Hx of  OTHER ABNORMAL GLUCOSE (Impaired Fasting Gluc) - borderline blood sugars in the past- diet Rx... ~  labs 3/08 showed BS= 99, HgA1c= 5.6 ~  labs 3/09 showed BS= 122, HgA1c= 5.6.Marland KitchenMarland Kitchen rec diet + exercise program... ~  labs 3/10 showed BS= 131, HgA1c= 5.6.Marland KitchenMarland Kitchen rec- contin diet efforts, get wt down. ~  labs 3/11 showed BS= 116, A1c= 5.4 ~  Labs 3/12 showed BS= 129 ~  Labs 4/13 showed BS= 122, A1c= 5.4 ~  Labs 5/14 on diet alone showed BS= 99, A1c= 5.4  MULTINODULAR THYROID >>  ~  4/13:  palp thyroid nodule on exam & Sonar performed w/ multinodular goiter, 2 dominant nodules on left & one large cyst==> proceed w/ needle bx & if benign we will recheck sonar in 44mo==> Bx= benign thyroid nodule... 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 ~  Colonoscopy 10/07 by DrPatterson was normal w/o rectal lesions seen, & otherw neg too... f/u planned 42yrs... ~  Follow up colonoscopy 10/12 by DrPatterson showed mild divertics, no polyps, otherw neg- f/u planned 40yrs.  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 she had a needle bx of liver in 1998 showing granulomatous hepatitis & ACE was 74 (8-52) and she was treated w/ Pred w/ normalization of her LFT's (and ACE level)... we were unable to wean to Qod regimen due to incr LFT's again, but stable on daily Rx- now down to 2.5mg /d. ~  prev LFT's normal 3/08 on Pred 5mg /d... continue same med... ~  labs 3/09 w/ SGOT 43 (0-37), all others WNL.Marland Kitchen. continue 5mg /d... ~  labs 9/09 showed SGOT= 42, others normal... rec- decr 5mg  to 1/2 tab daily... ~  labs 3/10 showed SGOT= 38, others WNL.Marland Kitchen. rec- contin same (2.5mg /d) ~  labs 9/10  showed SGOT= 40, SGPT= 28 ~  labs 3/11 showed SGOT= 39, SGPT= 25... Continue Pred 2.5mg /d ~  Labs 3/12 showed SGOT= 42, SGPT= 26 ~  6/12:  We discussed trying to decr the Pred to 2.5 Qod again... ~  Labs 9/12 showed SGOT= 38, SGPT= 21... Continue Pred 2.5mg Qod. ~  Labs 4/13 showed SGOT= 38, SGPT= 25... Continue Pred2.5mg Qod. ~  LFTs have been normal on low dose Pred- 2.5mg Qod; Labs 5/14 showed SGOT=30, SGPT=20  OSTEOPENIA (ICD-733.90) - on EVISTA 60mg /d, ca++, vits, & Vit D 2000 u daily per DrDickstein... ~  last BMD @ [facility] 9/05 showed TScores -0.7 to -1.1 ~  pt reports BMD and Vit D levels by DrDickstein & decr from 50K per wk to 2000u per day.  ANXIETY (ICD-300.00) - on TRANXENE 7.5mg  as needed.  ANEMIA (ICD-285.9) - prev hx of anemia... stabilized in the 11-12 range...  ~  labs 3/10 showed Hg= 12.0 ~  labs 9/10 showed Hg= 13.2 ~  labs 3/11 showed Hg= 12.6 ~  Labs 3/12 showed Hg= 13.5 ~  Labs 4/13 showed Hg= 13.1 ~  Labs 5/14 showed Hg= 12.8  Health Maintenance -  ~  GYN/ PAP per DrDickstein... pt reports last seen 9/10 ~  Mammograms at [facility]... done 2/10 & reported neg. ~  Colonoscopy last performed 7/07 by DrPatterson (see above). ~  Immunizations: she gets yearly Flu shots (given 9/11);  given PNEUMOVAX today 3/11- age69;  Had TETANUS ~2007 she thinks & we decided to give her the TDAP 4/13...   Past Surgical History  Procedure Laterality Date  . Cholecystectomy    . Tonsillectomy    . Breast lumpectomy      right    Outpatient Encounter Prescriptions as of 02/13/2013  Medication Sig Dispense Refill  .  atenolol (TENORMIN) 50 MG tablet TAKE 1 TABLET BY MOUTH ONCE DAILY  90 tablet  5  . calcium carbonate (TUMS - DOSED IN MG ELEMENTAL CALCIUM) 500 MG chewable tablet Chew 1 tablet by mouth daily.        . Cholecalciferol (VITAMIN D3) 2000 UNITS capsule Take 4,000 Units by mouth daily.       . clorazepate (TRANXENE) 7.5 MG tablet Take 1/2 to 1 tablet three times a day  as needed  90 tablet  5  . fish oil-omega-3 fatty acids 1000 MG capsule Take 1 g by mouth daily.        . furosemide (LASIX) 20 MG tablet TAKE 1 TABLET ONE TIME DAILY  90 tablet  PRN  . Garlic Oil 1000 MG CAPS Take 1 capsule by mouth daily.        Marland Kitchen lisinopril (PRINIVIL,ZESTRIL) 20 MG tablet take 1 tablet by mouth once daily  90 tablet  4  . Multiple Vitamins-Minerals (CENTRUM SILVER PO) Take 1 capsule by mouth daily.        . potassium chloride SA (K-DUR,KLOR-CON) 20 MEQ tablet Take 1 tablet (20 mEq total) by mouth daily.  90 tablet  3  . predniSONE (DELTASONE) 5 MG tablet Take 0.5 tablets (2.5 mg total) by mouth daily.  45 tablet  5  . raloxifene (EVISTA) 60 MG tablet Take 60 mg by mouth daily.         No facility-administered encounter medications on file as of 02/13/2013.    No Known Allergies   Current Medications, Allergies, Past Medical History, Past Surgical History, Family History, and Social History were reviewed in [employer] record.    Review of Systems        See HPI - all other systems neg except as noted... The patient denies anorexia, fever, weight loss, weight gain, vision loss, decreased hearing, hoarseness, chest pain, syncope, dyspnea on exertion, peripheral edema, prolonged cough, headaches, hemoptysis, abdominal pain, melena, hematochezia, severe indigestion/heartburn, hematuria, incontinence, muscle weakness, suspicious skin lesions, transient blindness, difficulty walking, depression, unusual weight change, abnormal bleeding, enlarged lymph nodes, and angioedema.     Objective:   Physical Exam     WD, Sl overweight, 72 y/o BF in NAD... GENERAL:  Alert & oriented; pleasant & cooperative... HEENT:  [place]/AT, EOM-wnl, PERRLA, EACs-clear, TMs-wnl, NOSE-clear, THROAT-clear & wnl. NECK:  Supple w/ fairROM; no JVD; normal carotid impulses w/o bruits; no thyromegaly or nodules palpated; no lymphadenopathy. CHEST:  Clear to P & A; without wheezes/  rales/ or rhonchi. HEART:  Regular Rhythm; without murmurs/ rubs/ or gallops. ABDOMEN:  Soft & nontender; normal bowel sounds; no organomegaly or masses detected. EXT: without deformities or arthritic changes; no varicose veins/ venous insuffic/ or edema. She has good ROM right shoulder... NEURO:  CN's intact; motor testing normal; sensory testing normal; gait normal & balance OK. DERM:  No lesions noted; no rash etc...  RADIOLOGY DATA:  Reviewed in the EPIC EMR & discussed w/ the patient...  LABORATORY DATA:  Reviewed in the EPIC EMR & discussed w/ the patient...   Assessment & Plan:    HBP>  Stable on 3 med regimen, continue same...  CHOL>  Stable on diet + Fish Oil, continue same...  Multinodular Thyroid>  Sonar w/ multinod gland but 2 dom nodules in left- Bx was benign 4/13;  Sonar 10/13 shows these nodules smaller; TFTs wnl...  Impaired Fasting Gluc>  Stable on diet alone & needs to get wt down!  Granulomatous  Hepatitis>  Pred at 2.5mg  Qod & she remains stable, asymptomatic, LFTs ok, continue same...  Hx of Anal Cancer> followed by DrPatterson, DrDickstein, DrStreck, DrLivesay; f/u eval scope by DrPatterson 10/12 was neg...  Osteopenia>  Remains on Evista, Calcium, MVI, Vit D...  Anxiety>  On Chlorazepate as needed...   Patient's Medications  New Prescriptions   No medications on file  Previous Medications   ATENOLOL (TENORMIN) 50 MG TABLET    TAKE 1 TABLET BY MOUTH ONCE DAILY   CALCIUM CARBONATE (TUMS - DOSED IN MG ELEMENTAL CALCIUM) 500 MG CHEWABLE TABLET    Chew 1 tablet by mouth daily.     CHOLECALCIFEROL (VITAMIN D3) 2000 UNITS CAPSULE    Take 4,000 Units by mouth daily.    CLORAZEPATE (TRANXENE) 7.5 MG TABLET    Take 1/2 to 1 tablet three times a day as needed   FISH OIL-OMEGA-3 FATTY ACIDS 1000 MG CAPSULE    Take 1 g by mouth daily.     FUROSEMIDE (LASIX) 20 MG TABLET    TAKE 1 TABLET ONE TIME DAILY   GARLIC OIL 1000 MG CAPS    Take 1 capsule by mouth daily.      LISINOPRIL (PRINIVIL,ZESTRIL) 20 MG TABLET    take 1 tablet by mouth once daily   MULTIPLE VITAMINS-MINERALS (CENTRUM SILVER PO)    Take 1 capsule by mouth daily.     POTASSIUM CHLORIDE SA (K-DUR,KLOR-CON) 20 MEQ TABLET    Take 1 tablet (20 mEq total) by mouth daily.   PREDNISONE (DELTASONE) 5 MG TABLET    Take 0.5 tablets (2.5 mg total) by mouth daily.   RALOXIFENE (EVISTA) 60 MG TABLET    Take 60 mg by mouth daily.    Modified Medications   No medications on file  Discontinued Medications   No medications on file

## 2013-02-13 NOTE — Patient Instructions (Addendum)
Today we updated your med list in our EPIC system...    Continue your current medications the same...    We refilled the meds you requested...  Today we did your follow up FASTING blood work...    We will contact you w/ the results when available...   Keep up the good work w/ diet 7 exercise...  Call for any questions...  Let's plan a follow up visit in 61mo, sooner if needed for problems.Marland KitchenMarland Kitchen

## 2013-02-14 LAB — VITAMIN D 25 HYDROXY (VIT D DEFICIENCY, FRACTURES): Vit D, 25-Hydroxy: 31 ng/mL (ref 30–89)

## 2013-03-12 ENCOUNTER — Other Ambulatory Visit: Payer: Self-pay | Admitting: Pulmonary Disease

## 2013-04-30 ENCOUNTER — Other Ambulatory Visit: Payer: Self-pay | Admitting: Pulmonary Disease

## 2013-08-25 ENCOUNTER — Ambulatory Visit (INDEPENDENT_AMBULATORY_CARE_PROVIDER_SITE_OTHER): Payer: Medicare PPO | Admitting: Pulmonary Disease

## 2013-08-25 ENCOUNTER — Encounter: Payer: Self-pay | Admitting: Pulmonary Disease

## 2013-08-25 VITALS — BP 140/72 | HR 88 | Temp 98.0°F | Ht 65.0 in | Wt 185.8 lb

## 2013-08-25 DIAGNOSIS — E041 Nontoxic single thyroid nodule: Secondary | ICD-10-CM

## 2013-08-25 DIAGNOSIS — R7309 Other abnormal glucose: Secondary | ICD-10-CM

## 2013-08-25 DIAGNOSIS — F411 Generalized anxiety disorder: Secondary | ICD-10-CM

## 2013-08-25 DIAGNOSIS — K739 Chronic hepatitis, unspecified: Secondary | ICD-10-CM

## 2013-08-25 DIAGNOSIS — C211 Malignant neoplasm of anal canal: Secondary | ICD-10-CM

## 2013-08-25 DIAGNOSIS — K5730 Diverticulosis of large intestine without perforation or abscess without bleeding: Secondary | ICD-10-CM

## 2013-08-25 DIAGNOSIS — M899 Disorder of bone, unspecified: Secondary | ICD-10-CM

## 2013-08-25 DIAGNOSIS — Z23 Encounter for immunization: Secondary | ICD-10-CM

## 2013-08-25 DIAGNOSIS — E785 Hyperlipidemia, unspecified: Secondary | ICD-10-CM

## 2013-08-25 DIAGNOSIS — I10 Essential (primary) hypertension: Secondary | ICD-10-CM

## 2013-08-25 DIAGNOSIS — I059 Rheumatic mitral valve disease, unspecified: Secondary | ICD-10-CM

## 2013-08-25 NOTE — Progress Notes (Signed)
Subjective:    Patient ID: Sharon Holt, female    DOB: October 24, 1940, 72 y.o.   MRN: [id number]  HPI 72 y/o BF here for a follow up visit... she has mult med problems including HBP;  ?of MVP w/ palpit;  Hyperlipidemia;  Borderline BS;  hx SqCellCa of anal canal;  Granulomatous Hepatitis;  Osteopenia;  & Anxiety...  ~  February 13, 2012:  55mo ROV & Sharon Holt is doing well, no new complaints or concerns; we reviewed her problem list, medications, xrays & labs> see below>>    Exam showed palp thyroid & Sonar reveals multinod goiter w/ 2 dom nodules on left> we will order needle Bx to r/o cancer & plan f/u sonar in 10mo...    She had f/u colonoscopy by DrPatterson 10/12 w/ divertics only, no recurrent polyps & he plans f/u again in 42yrs... LABS 4/13:  FLP- ok on diet alone;  Chems- ok x BS=122 A1c=5.4;  CBC- wnl;  TSH=1.55... Thyroid Ultrasound> multinodular goiter, 2 dominant nodules on left & one large cyst==> proceed w/ needle bx & if benign we will recheck sonar in 10mo... Needle Bx of dominant Thyroid nodule by IR ==> pending  ~  August 15, 2012:  10mo ROV & Sharon Holt has had a good 10mo interval w/o new complaints or concerns...    HBP> on Aten50, Lisin20, Lasix20, K20; BP= 124/82 & she denies HA, CP, palpit, SOB, swelling, etc...    Hyperlipid> on diet alone; wt=184# (up 4#); last FLP looked ok but needs to lose weight!    Borderline DM> on diet alone; labs 4/13 showed BS=122, A1c=5.4; continue diet & get weight down...    Multinod Thyroid> eval 4/13 showed multinod gland & bx of dom nodule on left was benign; exam is unchanged & f/u sonar showed similar findings w/ dom left lobe nodules sl smaller in size...    Hx Anal Cancer> she had XRT & ChemoRx in 2003 & no known recurrence...    Granulomatous Hepatitis> LFTs have been normal on low dose Pred- 2.5mg Qod...    DJD, Osteop> on Evista & supplements, doing well...    Anxiety> on Chlorazepate as needed... We reviewed prob list, meds, xrays  and labs> see below for updates >> OK 2013 Flu vaccine today... Thyroid Ultrasound:  Multinod gland- dom nodules on left are sl smaller; smaller nodules on right are sl larger; prev cyst in isthmus is gone... LABS 10/13:  Chems- ok w/ BS=106;  TSH=1.50...  ~  May, 1, 2014:  10mo ROV & Sharon Holt states she's doing well, good interval w/o new complaints or concerns;  We reviewed the following medical problems during today's office visit >>     HBP> on Aten50, Lisin20, Lasix20, K20; BP= 146/78 & she denies HA, CP, palpit, SOB, swelling, etc...    Hyperlipid> on diet alone; wt=188# (up 4#); FLP 5/14 shows TChol 177, TG 94, HDL 56, LDL 102 and rec to improve diet, get wt down...    Borderline DM> on diet alone; labs 5/14 showed BS=99, A1c=5.4; continue diet & get weight down...    Multinod Thyroid> eval 4/13 showed multinod gland & bx of dom nodule on left was benign; exam is unchanged & f/u sonar showed similar findings w/ dom left lobe nodules sl smaller in size; TSH=0.99...    Hx Anal Cancer> she had XRT & ChemoRx in 2003 & no known recurrence...    Granulomatous Hepatitis> LFTs have been normal on low dose Pred- 2.5mg Qod; Labs 5/14 showed SGOT=30, SGPT=20.Marland KitchenMarland Kitchen  DJD, Osteop> on Evista per Gyn & supplements including VitD 5000u daily; 5/14 VitD level = 31 & rec to continue same.    Anxiety> on Chlorazepate as needed. We reviewed prob list, meds, xrays and labs> see below for updates >> Fall screen- neg;  Depression screen- neg;  Meds refilled per request... LABS 5/14:  FLP- ok x LDL=102;  Chems- wnl;  CBC- wnl;  TSH=0.99;  VitD=31...  ~  August 25, 2013:  61mo ROV & Sharon Holt has had a good interval, no new complaints or concerns; she is now working part time at [employer] enjoying it!  We reviewed the following medical problems during today's office visit >>     HBP> on Aten50, Lisin20, Lasix20, K20; BP= 140/72 & she denies HA, CP, palpit, SOB, swelling, etc...    Hyperlipid> on diet alone;  wt=186# (down 2#); FLP 5/14 shows TChol 177, TG 94, HDL 56, LDL 102 and rec to improve diet, get wt down...    Borderline DM> on diet alone; labs 5/14 showed BS=99, A1c=5.4; continue diet & get weight down...    Multinod Thyroid> eval 4/13 showed multinod gland & bx of dom nodule on left was benign; exam is unchanged & f/u sonar 11/13 showed similar findings w/ dom left lobe nodules sl smaller in size; TSH=0.99...    Hx Anal Cancer> she had XRT & ChemoRx in 2003 & no known recurrence; last colonoscopy 10/12 by DrPatterson was ok, f/u 23yrs...    Granulomatous Hepatitis> LFTs have been normal on low dose Pred- 2.5mg Qod; Labs 5/14 showed SGOT=30, SGPT=20, continue same...    DJD, Osteop> on Evista per Gyn & supplements including VitD 5000u daily; 5/14 VitD level = 31 & rec to continue same.    Anxiety> on Chlorazepate as needed. We reviewed prob list, meds, xrays and labs> see below for updates >> OK 2014 Flu vaccine today...           Problem List:  HYPERTENSION (ICD-401.9) - on TENORMIN 50mg /d, LISINOPRIL 20mg /d, LASIX 20mg /d & K20/d... ~  3/12:  we decided to add LISINOPRIL 20mg /d to her Aten50 & Lasix20... ~  CXR 3/12 showed norm heart size, clear lungs, NAD... ~  6/12:  BP looks great on this 3 med regimen, 132/82 today & similar at home... ~  9/12:  BP=132/82 today, & similar at home... she denies HA, fatigue, visual changes, CP, palipit, dizziness, syncope, dyspnea, edema, etc... ~  4/13:  BP= 146/80 & she denies HA, CP, palpit, SOB, edema... ~  5/14:  on Aten50, Lisin20, Lasix20, K20; BP= 146/78 & she denies HA, CP, palpit, SOB, swelling, etc. ~  11/14:  Same meds- BP= 140/72 & she remains asymptomatic...  ? of MITRAL VALVE PROLAPSE (ICD-424.0) - asymptomatic: no CP or palpit recently... ~  2DEcho 1/96 showed WNL- no MVP seen, norm LVF w/ EF=55-60%...  PALPITATIONS (ICD-785.1) - no recent symptoms (controlled w/ meds)...  HYPERLIPIDEMIA, BORDERLINE (ICD-272.4) - on diet alone + Fish  Oil... ~  FLP 3/08 showed TChol 168, TG 73, HDL 56, LDL 97... weight 193#, continue diet Rx... ~  FLP 3/09 showed TChol 210, TG 148, HDL 62, LDL 105... weight 196#, needs better diet efforts. ~  FLP 9/09 showed TChol 169, TG 115, HDL 68, LDL 79... wt=195#.Marland KitchenMarland Kitchen contin diet efforts. ~  FLP 3/10 showed TChol 175, TG 91, HDL 55, LDL 102... wt=196#... rec- wt loss. ~  FLP 3/11 showed TChol 195, TG 116, HDL 77, LDL 95... Wt=191# ~  FLP 3/12 showed  TChol 209, TG 115, HDL 66, LDL 109... Wt=188# ~  FLP 4/13 showed TChol 204, TG 147, HDL 66, LDL 101... Wt=180# ~  FLP 5/14 on diet alone showed TChol 177, TG 94, HDL 56, LDL 102... Wt=188#  Hx of OTHER ABNORMAL GLUCOSE (Impaired Fasting Gluc) - borderline blood sugars in the past- diet Rx... ~  labs 3/08 showed BS= 99, HgA1c= 5.6 ~  labs 3/09 showed BS= 122, HgA1c= 5.6.Marland KitchenMarland Kitchen rec diet + exercise program... ~  labs 3/10 showed BS= 131, HgA1c= 5.6.Marland KitchenMarland Kitchen rec- contin diet efforts, get wt down. ~  labs 3/11 showed BS= 116, A1c= 5.4 ~  Labs 3/12 showed BS= 129 ~  Labs 4/13 showed BS= 122, A1c= 5.4 ~  Labs 5/14 on diet alone showed BS= 99, A1c= 5.4  MULTINODULAR THYROID >>  ~  4/13:  palp thyroid nodule on exam & Sonar performed w/ multinodular goiter, 2 dominant nodules on left & one large cyst==> proceed w/ needle bx & if benign we will recheck sonar in 5mo==> Bx= benign thyroid nodule... TSH= 1.55 ~  10/13:  f/u Thyroid sonar showed multinodular thyroid w/ dominant nodules on left appear sl smaller than before; TSH= 1.50 ~  11/14: exam is unchanged & she remains asymptomatic; we plan f/u sonar in the spring...  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 ~  Colonoscopy 10/07 by DrPatterson was normal w/o rectal lesions seen, & otherw neg too... f/u planned 94yrs... ~  Follow up colonoscopy 10/12 by DrPatterson showed mild  divertics, no polyps, otherw neg- f/u planned 54yrs.  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 she had a needle bx of liver in 1998 showing granulomatous hepatitis & ACE was 74 (8-52) and she was treated w/ Pred w/ normalization of her LFT's (and ACE level)... we were unable to wean to Qod regimen due to incr LFT's again, but stable on daily Rx- now down to 2.5mg /d. ~  prev LFT's normal 3/08 on Pred 5mg /d... continue same med... ~  labs 3/09 w/ SGOT 43 (0-37), all others WNL.Marland Kitchen. continue 5mg /d... ~  labs 9/09 showed SGOT= 42, others normal... rec- decr 5mg  to 1/2 tab daily... ~  labs 3/10 showed SGOT= 38, others WNL.Marland Kitchen. rec- contin same (2.5mg /d) ~  labs 9/10 showed SGOT= 40, SGPT= 28 ~  labs 3/11 showed SGOT= 39, SGPT= 25... Continue Pred 2.5mg /d ~  Labs 3/12 showed SGOT= 42, SGPT= 26 ~  6/12:  We discussed trying to decr the Pred to 2.5 Qod again... ~  Labs 9/12 showed SGOT= 38, SGPT= 21... Continue Pred 2.5mg Qod. ~  Labs 4/13 showed SGOT= 38, SGPT= 25... Continue Pred2.5mg Qod. ~  LFTs have been normal on low dose Pred- 2.5mg Qod; Labs 5/14 showed SGOT=30, SGPT=20  OSTEOPENIA (ICD-733.90) - on EVISTA 60mg /d, ca++, vits, & Vit D 2000 u daily per DrDickstein... ~  last BMD @ [facility] 9/05 showed TScores -0.7 to -1.1 ~  pt reports BMD and Vit D levels by DrDickstein & decr from 50K per wk to 2000u per day.  ANXIETY (ICD-300.00) - on TRANXENE 7.5mg  as needed.  ANEMIA (ICD-285.9) - prev hx of anemia... stabilized in the 11-12 range...  ~  labs 3/10 showed Hg= 12.0 ~  labs 9/10 showed Hg= 13.2 ~  labs 3/11 showed Hg= 12.6 ~  Labs 3/12 showed Hg= 13.5 ~  Labs 4/13 showed Hg= 13.1 ~  Labs 5/14 showed Hg= 12.8  Health Maintenance -  ~  GYN/ PAP per DrDickstein... pt reports last seen 9/10 ~  Mammograms at [facility]... done 2/10 & reported neg. ~  Colonoscopy last performed 7/07 by DrPatterson (see above). ~   Immunizations: she gets yearly Flu shots (given 9/11);  given PNEUMOVAX today 3/11- age69;  Had TETANUS ~2007 she thinks & we decided to give her the TDAP 4/13...   Past Surgical History  Procedure Laterality Date  . Cholecystectomy    . Tonsillectomy    . Breast lumpectomy      right    Outpatient Encounter Prescriptions as of 08/25/2013  Medication Sig  . atenolol (TENORMIN) 50 MG tablet TAKE 1 TABLET DAILY  . calcium carbonate (TUMS - DOSED IN MG ELEMENTAL CALCIUM) 500 MG chewable tablet Chew 1 tablet by mouth daily.    . Cholecalciferol (VITAMIN D3) 2000 UNITS capsule Take 4,000 Units by mouth daily.   . clorazepate (TRANXENE) 7.5 MG tablet take 1/2 to 1 tablet by mouth three times a day if needed  . fish oil-omega-3 fatty acids 1000 MG capsule Take 1 g by mouth daily.    . furosemide (LASIX) 20 MG tablet TAKE 1 TABLET DAILY  . Garlic Oil 1000 MG CAPS Take 1 capsule by mouth daily.    Marland Kitchen lisinopril (PRINIVIL,ZESTRIL) 20 MG tablet take 1 tablet by mouth once daily  . Multiple Vitamins-Minerals (CENTRUM SILVER PO) Take 1 capsule by mouth daily.    . potassium chloride SA (K-DUR,KLOR-CON) 20 MEQ tablet Take 1 tablet (20 mEq total) by mouth daily.  . predniSONE (DELTASONE) 5 MG tablet Take 0.5 tablets (2.5 mg total) by mouth daily.  . raloxifene (EVISTA) 60 MG tablet Take 60 mg by mouth daily.      No Known Allergies   Current Medications, Allergies, Past Medical History, Past Surgical History, Family History, and Social History were reviewed in [employer] record.    Review of Systems        See HPI - all other systems neg except as noted... The patient denies anorexia, fever, weight loss, weight gain, vision loss, decreased hearing, hoarseness, chest pain, syncope, dyspnea on exertion, peripheral edema, prolonged cough, headaches, hemoptysis, abdominal pain, melena, hematochezia, severe indigestion/heartburn, hematuria, incontinence, muscle weakness,  suspicious skin lesions, transient blindness, difficulty walking, depression, unusual weight change, abnormal bleeding, enlarged lymph nodes, and angioedema.     Objective:   Physical Exam     WD, Sl overweight, 72 y/o BF in NAD... GENERAL:  Alert & oriented; pleasant & cooperative... HEENT:  [place]/AT, EOM-wnl, PERRLA, EACs-clear, TMs-wnl, NOSE-clear, THROAT-clear & wnl. NECK:  Supple w/ fairROM; no JVD; normal carotid impulses w/o bruits; no thyromegaly or nodules palpated; no lymphadenopathy. CHEST:  Clear to P & A; without wheezes/ rales/ or rhonchi. HEART:  Regular Rhythm; without murmurs/ rubs/ or gallops. ABDOMEN:  Soft & nontender; normal bowel sounds; no organomegaly or masses detected. EXT: without deformities or arthritic changes; no varicose veins/ venous insuffic/ or edema. She has good ROM right shoulder... NEURO:  CN's intact; motor testing normal; sensory testing normal; gait normal & balance OK. DERM:  No lesions noted; no rash etc...  RADIOLOGY DATA:  Reviewed in the EPIC EMR & discussed w/ the patient...  LABORATORY DATA:  Reviewed in the EPIC EMR & discussed w/ the patient...   Assessment & Plan:    HBP>  Stable on 3 med regimen, continue same.Marland KitchenMarland Kitchen  CHOL>  Stable on diet + Fish Oil, continue same...  Multinodular Thyroid>  Sonar w/ multinod gland, 2 dom nodules in left- Bx was benign 4/13;  Sonar 10/13 shows these nodules smaller; TFTs wnl...  Impaired Fasting Gluc>  Stable on diet alone & needs to get wt down!  Granulomatous Hepatitis>  Pred at 2.5mg  Qod & she remains stable, asymptomatic, LFTs ok, continue same...  Hx of Anal Cancer> followed by DrPatterson, DrDickstein, DrStreck, DrLivesay; f/u eval scope by DrPatterson 10/12 was neg...  Osteopenia>  Remains on Evista, Calcium, MVI, Vit D...  Anxiety>  On Chlorazepate as needed...   Patient's Medications  New Prescriptions   No medications on file  Previous Medications   ATENOLOL (TENORMIN) 50 MG TABLET     TAKE 1 TABLET DAILY   CALCIUM CARBONATE (TUMS - DOSED IN MG ELEMENTAL CALCIUM) 500 MG CHEWABLE TABLET    Chew 1 tablet by mouth daily.     CHOLECALCIFEROL (VITAMIN D3) 2000 UNITS CAPSULE    Take 4,000 Units by mouth daily.    CLORAZEPATE (TRANXENE) 7.5 MG TABLET    take 1/2 to 1 tablet by mouth three times a day if needed   FISH OIL-OMEGA-3 FATTY ACIDS 1000 MG CAPSULE    Take 1 g by mouth daily.     FUROSEMIDE (LASIX) 20 MG TABLET    TAKE 1 TABLET DAILY   GARLIC OIL 1000 MG CAPS    Take 1 capsule by mouth daily.     LISINOPRIL (PRINIVIL,ZESTRIL) 20 MG TABLET    take 1 tablet by mouth once daily   MULTIPLE VITAMINS-MINERALS (CENTRUM SILVER PO)    Take 1 capsule by mouth daily.     POTASSIUM CHLORIDE SA (K-DUR,KLOR-CON) 20 MEQ TABLET    Take 1 tablet (20 mEq total) by mouth daily.   PREDNISONE (DELTASONE) 5 MG TABLET    Take 0.5 tablets (2.5 mg total) by mouth daily.   RALOXIFENE (EVISTA) 60 MG TABLET    Take 60 mg by mouth daily.    Modified Medications   No medications on file  Discontinued Medications   No medications on file

## 2013-08-25 NOTE — Patient Instructions (Signed)
Today we updated your med list in our EPIC system...    Continue your current medications the same...  Today we gave you the 2014 Flu vaccine...  Keep up the good work w/ diet, exercise, & wt reduction...  Call for any questions...  Let's plan a follow up visit in 74mo w/ Fasting blood work around that time.Marland KitchenMarland Kitchen

## 2013-11-24 ENCOUNTER — Other Ambulatory Visit: Payer: Self-pay | Admitting: Pulmonary Disease

## 2013-11-27 ENCOUNTER — Encounter: Payer: Self-pay | Admitting: Pulmonary Disease

## 2013-12-26 ENCOUNTER — Other Ambulatory Visit: Payer: Self-pay | Admitting: Pulmonary Disease

## 2013-12-26 DIAGNOSIS — D649 Anemia, unspecified: Secondary | ICD-10-CM

## 2013-12-26 DIAGNOSIS — I10 Essential (primary) hypertension: Secondary | ICD-10-CM

## 2013-12-26 DIAGNOSIS — E785 Hyperlipidemia, unspecified: Secondary | ICD-10-CM

## 2014-03-04 ENCOUNTER — Telehealth: Payer: Self-pay | Admitting: Pulmonary Disease

## 2014-03-04 ENCOUNTER — Ambulatory Visit: Payer: Medicare PPO | Admitting: Pulmonary Disease

## 2014-03-04 MED ORDER — LISINOPRIL 20 MG PO TABS: ORAL_TABLET | ORAL | Status: DC

## 2014-03-04 MED ORDER — FUROSEMIDE 20 MG PO TABS: ORAL_TABLET | ORAL | Status: DC

## 2014-03-04 MED ORDER — POTASSIUM CHLORIDE CRYS ER 20 MEQ PO TBCR: 20.0000 meq | EXTENDED_RELEASE_TABLET | Freq: Every day | ORAL | Status: DC

## 2014-03-04 MED ORDER — ATENOLOL 50 MG PO TABS: ORAL_TABLET | ORAL | Status: DC

## 2014-03-04 NOTE — Telephone Encounter (Signed)
[id number] x1 

## 2014-03-04 NOTE — Telephone Encounter (Signed)
Spoke with the pt to verify the msg  I have sent her refills to [facility]  Nothing further needed per pt

## 2014-03-12 ENCOUNTER — Telehealth: Payer: Self-pay | Admitting: Pulmonary Disease

## 2014-03-12 MED ORDER — LISINOPRIL 20 MG PO TABS: ORAL_TABLET | ORAL | Status: DC

## 2014-03-12 NOTE — Telephone Encounter (Signed)
Called and spoke with pts family and they are aware that the rx for the lisinopril has been sent to the pharmacy and they can put this rx on hold if she does not need this at this time.

## 2014-03-20 ENCOUNTER — Encounter: Payer: Self-pay | Admitting: Internal Medicine

## 2014-03-20 ENCOUNTER — Ambulatory Visit (INDEPENDENT_AMBULATORY_CARE_PROVIDER_SITE_OTHER): Payer: Commercial Managed Care - HMO | Admitting: Internal Medicine

## 2014-03-20 VITALS — BP 170/100 | HR 98 | Temp 98.3°F | Ht 65.0 in | Wt 179.0 lb

## 2014-03-20 DIAGNOSIS — F411 Generalized anxiety disorder: Secondary | ICD-10-CM

## 2014-03-20 DIAGNOSIS — M949 Disorder of cartilage, unspecified: Secondary | ICD-10-CM

## 2014-03-20 DIAGNOSIS — I10 Essential (primary) hypertension: Secondary | ICD-10-CM

## 2014-03-20 DIAGNOSIS — F4321 Adjustment disorder with depressed mood: Secondary | ICD-10-CM

## 2014-03-20 DIAGNOSIS — C211 Malignant neoplasm of anal canal: Secondary | ICD-10-CM

## 2014-03-20 DIAGNOSIS — S61509A Unspecified open wound of unspecified wrist, initial encounter: Secondary | ICD-10-CM

## 2014-03-20 DIAGNOSIS — M899 Disorder of bone, unspecified: Secondary | ICD-10-CM

## 2014-03-20 DIAGNOSIS — S61512A Laceration without foreign body of left wrist, initial encounter: Secondary | ICD-10-CM

## 2014-03-20 DIAGNOSIS — K739 Chronic hepatitis, unspecified: Secondary | ICD-10-CM

## 2014-03-20 MED ORDER — CLORAZEPATE DIPOTASSIUM 7.5 MG PO TABS: 7.5000 mg | ORAL_TABLET | Freq: Three times a day (TID) | ORAL | Status: DC | PRN

## 2014-03-20 MED ORDER — LISINOPRIL 20 MG PO TABS: ORAL_TABLET | ORAL | Status: DC

## 2014-03-20 NOTE — Assessment & Plan Note (Signed)
On Evista per GYN

## 2014-03-20 NOTE — Assessment & Plan Note (Signed)
Continue with current prescription therapy as reflected on the Med list.  

## 2014-03-20 NOTE — Assessment & Plan Note (Signed)
Discussed.

## 2014-03-20 NOTE — Progress Notes (Signed)
   Subjective:    HPI  New pt - former dr Jeannine Kitten Her brother just died Laceration L wrist a few days ago  The patient presents to address her  chronic hypertension, chronic dyslipidemia, hepatitis on steroids controlled  BP Readings from Last 3 Encounters:  03/20/14 170/100  08/25/13 140/72  02/13/13 146/78   Wt Readings from Last 3 Encounters:  03/20/14 179 lb (81.194 kg)  08/25/13 185 lb 12.8 oz (84.278 kg)  02/13/13 188 lb (85.276 kg)      Review of Systems  Constitutional: Negative for chills, activity change, appetite change, fatigue and unexpected weight change.  HENT: Negative for congestion, ear pain, mouth sores, sinus pressure and voice change.   Eyes: Negative for visual disturbance.  Respiratory: Negative for cough, chest tightness and wheezing.   Cardiovascular: Negative for chest pain.  Gastrointestinal: Negative for nausea, vomiting and abdominal pain.  Genitourinary: Negative for urgency, frequency, difficulty urinating and vaginal pain.  Musculoskeletal: Negative for back pain, gait problem and neck pain.  Skin: Positive for wound. Negative for pallor and rash.  Neurological: Negative for dizziness, tremors, weakness, numbness and headaches.  Psychiatric/Behavioral: Positive for decreased concentration. Negative for suicidal ideas, confusion and sleep disturbance. The patient is nervous/anxious.        Objective:   Physical Exam  Constitutional: She appears well-developed. No distress.  HENT:  Head: Normocephalic.  Right Ear: External ear normal.  Left Ear: External ear normal.  Nose: Nose normal.  Mouth/Throat: Oropharynx is clear and moist.  Eyes: Conjunctivae are normal. Pupils are equal, round, and reactive to light. Right eye exhibits no discharge. Left eye exhibits no discharge.  Neck: Normal range of motion. Neck supple. No JVD present. No tracheal deviation present. No thyromegaly present.  Cardiovascular: Normal rate, regular rhythm and  normal heart sounds.   Pulmonary/Chest: No stridor. No respiratory distress. She has no wheezes.  Abdominal: Soft. Bowel sounds are normal. She exhibits no distension and no mass. There is no tenderness. There is no rebound and no guarding.  Musculoskeletal: She exhibits no edema and no tenderness.  Lymphadenopathy:    She has no cervical adenopathy.  Neurological: She displays normal reflexes. No cranial nerve deficit. She exhibits normal muscle tone. Coordination normal.  Skin: No rash noted. No erythema.  L inner distal wrist laceration 3 cm by 2-3 mm, granulating  Psychiatric: Her behavior is normal. Judgment and thought content normal.  tearful    Lab Results  Component Value Date   WBC 4.3* 02/13/2013   HGB 12.8 02/13/2013   HCT 36.0 02/13/2013   PLT 214.0 02/13/2013   GLUCOSE 99 02/13/2013   CHOL 177 02/13/2013   TRIG 94.0 02/13/2013   HDL 56.20 02/13/2013   LDLDIRECT 100.6 02/13/2012   LDLCALC 102* 02/13/2013   ALT 20 02/13/2013   AST 30 02/13/2013   NA 136 02/13/2013   K 3.9 02/13/2013   CL 105 02/13/2013   CREATININE 0.8 02/13/2013   BUN 16 02/13/2013   CO2 28 02/13/2013   TSH 0.99 02/13/2013   HGBA1C 5.4 02/13/2013         Assessment & Plan:

## 2014-03-20 NOTE — Progress Notes (Signed)
Pre visit review using our clinic review tool, if applicable. No additional management support is needed unless otherwise documented below in the visit note. 

## 2014-03-20 NOTE — Assessment & Plan Note (Signed)
BP OK at home Continue with current prescription therapy as reflected on the Med list.  

## 2014-03-20 NOTE — Assessment & Plan Note (Signed)
S/p XRT 

## 2014-03-20 NOTE — Assessment & Plan Note (Signed)
Continue with current prn prescription therapy as reflected on the Med list.  

## 2014-03-22 DIAGNOSIS — S61512A Laceration without foreign body of left wrist, initial encounter: Secondary | ICD-10-CM | POA: Insufficient documentation

## 2014-03-22 NOTE — Assessment & Plan Note (Signed)
Healing - too late to use sutures Dressed

## 2014-03-23 ENCOUNTER — Telehealth: Payer: Self-pay | Admitting: Internal Medicine

## 2014-03-23 NOTE — Telephone Encounter (Signed)
Relevant patient education mailed to patient.  

## 2014-04-01 ENCOUNTER — Other Ambulatory Visit: Payer: Commercial Managed Care - HMO

## 2014-05-19 ENCOUNTER — Other Ambulatory Visit: Payer: Self-pay | Admitting: Pulmonary Disease

## 2014-06-18 ENCOUNTER — Telehealth: Payer: Self-pay

## 2014-06-18 ENCOUNTER — Telehealth: Payer: Self-pay | Admitting: Internal Medicine

## 2014-06-18 NOTE — Telephone Encounter (Signed)
Patient called stating her pharmacy has sent multiple requests for new rx for prednisone. I do not see that we have received anything. Pt needs this ASAP. She uses [facility].

## 2014-06-18 NOTE — Telephone Encounter (Signed)
Ok Thx 

## 2014-06-18 NOTE — Telephone Encounter (Signed)
OK to fill - see my reply Thx

## 2014-06-18 NOTE — Telephone Encounter (Signed)
Received refill request for prednisone 5 mg 1/2 tab po daily qty 45 w/ 5 rf.  Do you want to refill this med?

## 2014-06-19 MED ORDER — PREDNISONE 5 MG PO TABS: 2.5000 mg | ORAL_TABLET | Freq: Every day | ORAL | Status: DC

## 2014-06-19 NOTE — Telephone Encounter (Signed)
Done

## 2014-06-19 NOTE — Telephone Encounter (Signed)
Rf sent

## 2014-06-29 ENCOUNTER — Other Ambulatory Visit: Payer: Self-pay | Admitting: Pulmonary Disease

## 2014-07-09 ENCOUNTER — Telehealth: Payer: Self-pay | Admitting: Internal Medicine

## 2014-07-09 MED ORDER — ATENOLOL 50 MG PO TABS: ORAL_TABLET | ORAL | Status: DC

## 2014-07-09 MED ORDER — LISINOPRIL 20 MG PO TABS: ORAL_TABLET | ORAL | Status: DC

## 2014-07-09 NOTE — Telephone Encounter (Signed)
Patient is requesting atenolol and lisinopril to be sent to [facility] fax# [phone]

## 2014-07-09 NOTE — Telephone Encounter (Signed)
Done

## 2014-09-25 ENCOUNTER — Encounter: Payer: Self-pay | Admitting: Internal Medicine

## 2014-09-25 ENCOUNTER — Other Ambulatory Visit (INDEPENDENT_AMBULATORY_CARE_PROVIDER_SITE_OTHER): Payer: Commercial Managed Care - HMO

## 2014-09-25 ENCOUNTER — Ambulatory Visit (INDEPENDENT_AMBULATORY_CARE_PROVIDER_SITE_OTHER): Payer: Commercial Managed Care - HMO | Admitting: Internal Medicine

## 2014-09-25 VITALS — BP 158/90 | HR 83 | Temp 98.5°F | Ht 65.0 in | Wt 186.0 lb

## 2014-09-25 DIAGNOSIS — Z23 Encounter for immunization: Secondary | ICD-10-CM

## 2014-09-25 DIAGNOSIS — E785 Hyperlipidemia, unspecified: Secondary | ICD-10-CM

## 2014-09-25 DIAGNOSIS — D6489 Other specified anemias: Secondary | ICD-10-CM

## 2014-09-25 DIAGNOSIS — Z0000 Encounter for general adult medical examination without abnormal findings: Secondary | ICD-10-CM

## 2014-09-25 LAB — CBC WITH DIFFERENTIAL/PLATELET
Basophils Absolute: 0 10*3/uL (ref 0.0–0.1)
Basophils Relative: 0.6 % (ref 0.0–3.0)
EOS PCT: 4.7 % (ref 0.0–5.0)
Eosinophils Absolute: 0.2 10*3/uL (ref 0.0–0.7)
HEMATOCRIT: 38.9 % (ref 36.0–46.0)
HEMOGLOBIN: 12.6 g/dL (ref 12.0–15.0)
LYMPHS ABS: 1.2 10*3/uL (ref 0.7–4.0)
Lymphocytes Relative: 31.7 % (ref 12.0–46.0)
MCHC: 32.5 g/dL (ref 30.0–36.0)
MCV: 91.9 fl (ref 78.0–100.0)
Monocytes Absolute: 0.4 10*3/uL (ref 0.1–1.0)
Monocytes Relative: 10.3 % (ref 3.0–12.0)
Neutro Abs: 2.1 10*3/uL (ref 1.4–7.7)
Neutrophils Relative %: 52.7 % (ref 43.0–77.0)
PLATELETS: 214 10*3/uL (ref 150.0–400.0)
RBC: 4.23 Mil/uL (ref 3.87–5.11)
RDW: 13.2 % (ref 11.5–15.5)
WBC: 3.9 10*3/uL — AB (ref 4.0–10.5)

## 2014-09-25 LAB — BASIC METABOLIC PANEL
BUN: 13 mg/dL (ref 6–23)
CO2: 27 mEq/L (ref 19–32)
Calcium: 9.2 mg/dL (ref 8.4–10.5)
Chloride: 107 mEq/L (ref 96–112)
Creatinine, Ser: 0.8 mg/dL (ref 0.4–1.2)
GFR: 78.16 mL/min (ref >60.00)
Glucose, Bld: 107 mg/dL — ABNORMAL HIGH (ref 70–99)
POTASSIUM: 4 meq/L (ref 3.5–5.1)
SODIUM: 137 meq/L (ref 135–145)

## 2014-09-25 LAB — TSH: TSH: 1.25 u[IU]/mL (ref 0.35–4.50)

## 2014-09-25 LAB — LIPID PANEL
CHOL/HDL RATIO: 3
CHOLESTEROL: 185 mg/dL (ref 0–200)
HDL: 63.7 mg/dL (ref >39.00)
LDL Cholesterol: 104 mg/dL — ABNORMAL HIGH (ref 0–99)
NonHDL: 121.3
Triglycerides: 86 mg/dL (ref 0.0–149.0)
VLDL: 17.2 mg/dL (ref 0.0–40.0)

## 2014-09-25 LAB — HEPATIC FUNCTION PANEL
ALT: 20 U/L (ref 0–35)
AST: 32 U/L (ref 0–37)
Albumin: 3.7 g/dL (ref 3.5–5.2)
Alkaline Phosphatase: 50 U/L (ref 39–117)
Bilirubin, Direct: 0.1 mg/dL (ref 0.0–0.3)
Total Bilirubin: 0.7 mg/dL (ref 0.2–1.2)
Total Protein: 7.4 g/dL (ref 6.0–8.3)

## 2014-09-25 LAB — URINALYSIS
BILIRUBIN URINE: NEGATIVE
HGB URINE DIPSTICK: NEGATIVE
Ketones, ur: NEGATIVE
Leukocytes, UA: NEGATIVE
NITRITE: NEGATIVE
PH: 5.5 (ref 5.0–8.0)
Specific Gravity, Urine: 1.015 (ref 1.000–1.030)
Total Protein, Urine: NEGATIVE
UROBILINOGEN UA: 0.2 (ref 0.0–1.0)
Urine Glucose: NEGATIVE

## 2014-09-25 NOTE — Progress Notes (Signed)
   Subjective:    HPI  The patient is here for a wellness exam. The patient has been doing well overall without major physical or psychological issues going on lately. Her sister and brother died in 63 C/o diarrhea   The patient presents to address her  chronic hypertension, chronic dyslipidemia, hepatitis on steroids controlled  BP Readings from Last 3 Encounters:  09/25/14 158/90  03/20/14 170/100  08/25/13 140/72   Wt Readings from Last 3 Encounters:  09/25/14 186 lb (84.369 kg)  03/20/14 179 lb (81.194 kg)  08/25/13 185 lb 12.8 oz (84.278 kg)      Review of Systems  Constitutional: Negative for chills, activity change, appetite change, fatigue and unexpected weight change.  HENT: Negative for congestion, ear pain, mouth sores, sinus pressure and voice change.   Eyes: Negative for visual disturbance.  Respiratory: Negative for cough, chest tightness and wheezing.   Cardiovascular: Negative for chest pain.  Gastrointestinal: Negative for nausea, vomiting and abdominal pain.  Genitourinary: Negative for urgency, frequency, difficulty urinating and vaginal pain.  Musculoskeletal: Negative for back pain, gait problem and neck pain.  Skin: Positive for wound. Negative for pallor and rash.  Neurological: Negative for dizziness, tremors, weakness, numbness and headaches.  Psychiatric/Behavioral: Positive for decreased concentration. Negative for suicidal ideas, confusion and sleep disturbance. The patient is nervous/anxious.        Objective:   Physical Exam  Constitutional: She appears well-developed. No distress.  HENT:  Head: Normocephalic.  Right Ear: External ear normal.  Left Ear: External ear normal.  Nose: Nose normal.  Mouth/Throat: Oropharynx is clear and moist.  Eyes: Conjunctivae are normal. Pupils are equal, round, and reactive to light. Right eye exhibits no discharge. Left eye exhibits no discharge.  Neck: Normal range of motion. Neck supple. No JVD  present. No tracheal deviation present. No thyromegaly present.  Cardiovascular: Normal rate, regular rhythm and normal heart sounds.   Pulmonary/Chest: No stridor. No respiratory distress. She has no wheezes.  Abdominal: Soft. Bowel sounds are normal. She exhibits no distension and no mass. There is no tenderness. There is no rebound and no guarding.  Musculoskeletal: She exhibits no edema or tenderness.  Lymphadenopathy:    She has no cervical adenopathy.  Neurological: She displays normal reflexes. No cranial nerve deficit. She exhibits normal muscle tone. Coordination normal.  Skin: No rash noted. No erythema.  Psychiatric: She has a normal mood and affect. Her behavior is normal. Judgment and thought content normal.    Lab Results  Component Value Date   WBC 4.3* 02/13/2013   HGB 12.8 02/13/2013   HCT 36.0 02/13/2013   PLT 214.0 02/13/2013   GLUCOSE 99 02/13/2013   CHOL 177 02/13/2013   TRIG 94.0 02/13/2013   HDL 56.20 02/13/2013   LDLDIRECT 100.6 02/13/2012   LDLCALC 102* 02/13/2013   ALT 20 02/13/2013   AST 30 02/13/2013   NA 136 02/13/2013   K 3.9 02/13/2013   CL 105 02/13/2013   CREATININE 0.8 02/13/2013   BUN 16 02/13/2013   CO2 28 02/13/2013   TSH 0.99 02/13/2013   HGBA1C 5.4 02/13/2013         Assessment & Plan:

## 2014-09-25 NOTE — Patient Instructions (Signed)
Preventive Care for Adults A healthy lifestyle and preventive care can promote health and wellness. Preventive health guidelines for women include the following key practices.  A routine yearly physical is a good way to check with your health care provider about your health and preventive screening. It is a chance to share any concerns and updates on your health and to receive a thorough exam.  Visit your dentist for a routine exam and preventive care every 6 months. Brush your teeth twice a day and floss once a day. Good oral hygiene prevents tooth decay and gum disease.  The frequency of eye exams is based on your age, health, family medical history, use of contact lenses, and other factors. Follow your health care provider's recommendations for frequency of eye exams.  Eat a healthy diet. Foods like vegetables, fruits, whole grains, low-fat dairy products, and lean protein foods contain the nutrients you need without too many calories. Decrease your intake of foods high in solid fats, added sugars, and salt. Eat the right amount of calories for you.Get information about a proper diet from your health care provider, if necessary.  Regular physical exercise is one of the most important things you can do for your health. Most adults should get at least 150 minutes of moderate-intensity exercise (any activity that increases your heart rate and causes you to sweat) each week. In addition, most adults need muscle-strengthening exercises on 2 or more days a week.  Maintain a healthy weight. The body mass index (BMI) is a screening tool to identify possible weight problems. It provides an estimate of body fat based on height and weight. Your health care provider can find your BMI and can help you achieve or maintain a healthy weight.For adults 20 years and older:  A BMI below 18.5 is considered underweight.  A BMI of 18.5 to 24.9 is normal.  A BMI of 25 to 29.9 is considered overweight.  A BMI of  30 and above is considered obese.  Maintain normal blood lipids and cholesterol levels by exercising and minimizing your intake of saturated fat. Eat a balanced diet with plenty of fruit and vegetables. Blood tests for lipids and cholesterol should begin at age 76 and be repeated every 5 years. If your lipid or cholesterol levels are high, you are over 50, or you are at high risk for heart disease, you may need your cholesterol levels checked more frequently.Ongoing high lipid and cholesterol levels should be treated with medicines if diet and exercise are not working.  If you smoke, find out from your health care provider how to quit. If you do not use tobacco, do not start.  Lung cancer screening is recommended for adults aged 22-80 years who are at high risk for developing lung cancer because of a history of smoking. A yearly low-dose CT scan of the lungs is recommended for people who have at least a 30-pack-year history of smoking and are a current smoker or have quit within the past 15 years. A pack year of smoking is smoking an average of 1 pack of cigarettes a day for 1 year (for example: 1 pack a day for 30 years or 2 packs a day for 15 years). Yearly screening should continue until the smoker has stopped smoking for at least 15 years. Yearly screening should be stopped for people who develop a health problem that would prevent them from having lung cancer treatment.  If you are pregnant, do not drink alcohol. If you are breastfeeding,  be very cautious about drinking alcohol. If you are not pregnant and choose to drink alcohol, do not have more than 1 drink per day. One drink is considered to be 12 ounces (355 mL) of beer, 5 ounces (148 mL) of wine, or 1.5 ounces (44 mL) of liquor.  Avoid use of street drugs. Do not share needles with anyone. Ask for help if you need support or instructions about stopping the use of drugs.  High blood pressure causes heart disease and increases the risk of  stroke. Your blood pressure should be checked at least every 1 to 2 years. Ongoing high blood pressure should be treated with medicines if weight loss and exercise do not work.  If you are 75-52 years old, ask your health care provider if you should take aspirin to prevent strokes.  Diabetes screening involves taking a blood sample to check your fasting blood sugar level. This should be done once every 3 years, after age 15, if you are within normal weight and without risk factors for diabetes. Testing should be considered at a younger age or be carried out more frequently if you are overweight and have at least 1 risk factor for diabetes.  Breast cancer screening is essential preventive care for women. You should practice "breast self-awareness." This means understanding the normal appearance and feel of your breasts and may include breast self-examination. Any changes detected, no matter how small, should be reported to a health care provider. Women in their 58s and 30s should have a clinical breast exam (CBE) by a health care provider as part of a regular health exam every 1 to 3 years. After age 16, women should have a CBE every year. Starting at age 53, women should consider having a mammogram (breast X-ray test) every year. Women who have a family history of breast cancer should talk to their health care provider about genetic screening. Women at a high risk of breast cancer should talk to their health care providers about having an MRI and a mammogram every year.  Breast cancer gene (BRCA)-related cancer risk assessment is recommended for women who have family members with BRCA-related cancers. BRCA-related cancers include breast, ovarian, tubal, and peritoneal cancers. Having family members with these cancers may be associated with an increased risk for harmful changes (mutations) in the breast cancer genes BRCA1 and BRCA2. Results of the assessment will determine the need for genetic counseling and  BRCA1 and BRCA2 testing.  Routine pelvic exams to screen for cancer are no longer recommended for nonpregnant women who are considered low risk for cancer of the pelvic organs (ovaries, uterus, and vagina) and who do not have symptoms. Ask your health care provider if a screening pelvic exam is right for you.  If you have had past treatment for cervical cancer or a condition that could lead to cancer, you need Pap tests and screening for cancer for at least 20 years after your treatment. If Pap tests have been discontinued, your risk factors (such as having a new sexual partner) need to be reassessed to determine if screening should be resumed. Some women have medical problems that increase the chance of getting cervical cancer. In these cases, your health care provider may recommend more frequent screening and Pap tests.  The HPV test is an additional test that may be used for cervical cancer screening. The HPV test looks for the virus that can cause the cell changes on the cervix. The cells collected during the Pap test can be  tested for HPV. The HPV test could be used to screen women aged 30 years and older, and should be used in women of any age who have unclear Pap test results. After the age of 30, women should have HPV testing at the same frequency as a Pap test.  Colorectal cancer can be detected and often prevented. Most routine colorectal cancer screening begins at the age of 50 years and continues through age 75 years. However, your health care provider may recommend screening at an earlier age if you have risk factors for colon cancer. On a yearly basis, your health care provider may provide home test kits to check for hidden blood in the stool. Use of a small camera at the end of a tube, to directly examine the colon (sigmoidoscopy or colonoscopy), can detect the earliest forms of colorectal cancer. Talk to your health care provider about this at age 50, when routine screening begins. Direct  exam of the colon should be repeated every 5-10 years through age 75 years, unless early forms of pre-cancerous polyps or small growths are found.  People who are at an increased risk for hepatitis B should be screened for this virus. You are considered at high risk for hepatitis B if:  You were born in a country where hepatitis B occurs often. Talk with your health care provider about which countries are considered high risk.  Your parents were born in a high-risk country and you have not received a shot to protect against hepatitis B (hepatitis B vaccine).  You have HIV or AIDS.  You use needles to inject street drugs.  You live with, or have sex with, someone who has hepatitis B.  You get hemodialysis treatment.  You take certain medicines for conditions like cancer, organ transplantation, and autoimmune conditions.  Hepatitis C blood testing is recommended for all people born from 1945 through 1965 and any individual with known risks for hepatitis C.  Practice safe sex. Use condoms and avoid high-risk sexual practices to reduce the spread of sexually transmitted infections (STIs). STIs include gonorrhea, chlamydia, syphilis, trichomonas, herpes, HPV, and human immunodeficiency virus (HIV). Herpes, HIV, and HPV are viral illnesses that have no cure. They can result in disability, cancer, and death.  You should be screened for sexually transmitted illnesses (STIs) including gonorrhea and chlamydia if:  You are sexually active and are younger than 24 years.  You are older than 24 years and your health care provider tells you that you are at risk for this type of infection.  Your sexual activity has changed since you were last screened and you are at an increased risk for chlamydia or gonorrhea. Ask your health care provider if you are at risk.  If you are at risk of being infected with HIV, it is recommended that you take a prescription medicine daily to prevent HIV infection. This is  called preexposure prophylaxis (PrEP). You are considered at risk if:  You are a heterosexual woman, are sexually active, and are at increased risk for HIV infection.  You take drugs by injection.  You are sexually active with a partner who has HIV.  Talk with your health care provider about whether you are at high risk of being infected with HIV. If you choose to begin PrEP, you should first be tested for HIV. You should then be tested every 3 months for as long as you are taking PrEP.  Osteoporosis is a disease in which the bones lose minerals and strength   with aging. This can result in serious bone fractures or breaks. The risk of osteoporosis can be identified using a bone density scan. Women ages 65 years and over and women at risk for fractures or osteoporosis should discuss screening with their health care providers. Ask your health care provider whether you should take a calcium supplement or vitamin D to reduce the rate of osteoporosis.  Menopause can be associated with physical symptoms and risks. Hormone replacement therapy is available to decrease symptoms and risks. You should talk to your health care provider about whether hormone replacement therapy is right for you.  Use sunscreen. Apply sunscreen liberally and repeatedly throughout the day. You should seek shade when your shadow is shorter than you. Protect yourself by wearing long sleeves, pants, a wide-brimmed hat, and sunglasses year round, whenever you are outdoors.  Once a month, do a whole body skin exam, using a mirror to look at the skin on your back. Tell your health care provider of new moles, moles that have irregular borders, moles that are larger than a pencil eraser, or moles that have changed in shape or color.  Stay current with required vaccines (immunizations).  Influenza vaccine. All adults should be immunized every year.  Tetanus, diphtheria, and acellular pertussis (Td, Tdap) vaccine. Pregnant women should  receive 1 dose of Tdap vaccine during each pregnancy. The dose should be obtained regardless of the length of time since the last dose. Immunization is preferred during the 27th-36th week of gestation. An adult who has not previously received Tdap or who does not know her vaccine status should receive 1 dose of Tdap. This initial dose should be followed by tetanus and diphtheria toxoids (Td) booster doses every 10 years. Adults with an unknown or incomplete history of completing a 3-dose immunization series with Td-containing vaccines should begin or complete a primary immunization series including a Tdap dose. Adults should receive a Td booster every 10 years.  Varicella vaccine. An adult without evidence of immunity to varicella should receive 2 doses or a second dose if she has previously received 1 dose. Pregnant females who do not have evidence of immunity should receive the first dose after pregnancy. This first dose should be obtained before leaving the health care facility. The second dose should be obtained 4-8 weeks after the first dose.  Human papillomavirus (HPV) vaccine. Females aged 13-26 years who have not received the vaccine previously should obtain the 3-dose series. The vaccine is not recommended for use in pregnant females. However, pregnancy testing is not needed before receiving a dose. If a female is found to be pregnant after receiving a dose, no treatment is needed. In that case, the remaining doses should be delayed until after the pregnancy. Immunization is recommended for any person with an immunocompromised condition through the age of 26 years if she did not get any or all doses earlier. During the 3-dose series, the second dose should be obtained 4-8 weeks after the first dose. The third dose should be obtained 24 weeks after the first dose and 16 weeks after the second dose.  Zoster vaccine. One dose is recommended for adults aged 60 years or older unless certain conditions are  present.  Measles, mumps, and rubella (MMR) vaccine. Adults born before 1957 generally are considered immune to measles and mumps. Adults born in 1957 or later should have 1 or more doses of MMR vaccine unless there is a contraindication to the vaccine or there is laboratory evidence of immunity to   each of the three diseases. A routine second dose of MMR vaccine should be obtained at least 28 days after the first dose for students attending postsecondary schools, health care workers, or international travelers. People who received inactivated measles vaccine or an unknown type of measles vaccine during 1963-1967 should receive 2 doses of MMR vaccine. People who received inactivated mumps vaccine or an unknown type of mumps vaccine before 1979 and are at high risk for mumps infection should consider immunization with 2 doses of MMR vaccine. For females of childbearing age, rubella immunity should be determined. If there is no evidence of immunity, females who are not pregnant should be vaccinated. If there is no evidence of immunity, females who are pregnant should delay immunization until after pregnancy. Unvaccinated health care workers born before 1957 who lack laboratory evidence of measles, mumps, or rubella immunity or laboratory confirmation of disease should consider measles and mumps immunization with 2 doses of MMR vaccine or rubella immunization with 1 dose of MMR vaccine.  Pneumococcal 13-valent conjugate (PCV13) vaccine. When indicated, a person who is uncertain of her immunization history and has no record of immunization should receive the PCV13 vaccine. An adult aged 19 years or older who has certain medical conditions and has not been previously immunized should receive 1 dose of PCV13 vaccine. This PCV13 should be followed with a dose of pneumococcal polysaccharide (PPSV23) vaccine. The PPSV23 vaccine dose should be obtained at least 8 weeks after the dose of PCV13 vaccine. An adult aged 19  years or older who has certain medical conditions and previously received 1 or more doses of PPSV23 vaccine should receive 1 dose of PCV13. The PCV13 vaccine dose should be obtained 1 or more years after the last PPSV23 vaccine dose.  Pneumococcal polysaccharide (PPSV23) vaccine. When PCV13 is also indicated, PCV13 should be obtained first. All adults aged 65 years and older should be immunized. An adult younger than age 65 years who has certain medical conditions should be immunized. Any person who resides in a nursing home or long-term care facility should be immunized. An adult smoker should be immunized. People with an immunocompromised condition and certain other conditions should receive both PCV13 and PPSV23 vaccines. People with human immunodeficiency virus (HIV) infection should be immunized as soon as possible after diagnosis. Immunization during chemotherapy or radiation therapy should be avoided. Routine use of PPSV23 vaccine is not recommended for American Indians, [place] Natives, or people younger than 65 years unless there are medical conditions that require PPSV23 vaccine. When indicated, people who have unknown immunization and have no record of immunization should receive PPSV23 vaccine. One-time revaccination 5 years after the first dose of PPSV23 is recommended for people aged 19-64 years who have chronic kidney failure, nephrotic syndrome, asplenia, or immunocompromised conditions. People who received 1-2 doses of PPSV23 before age 65 years should receive another dose of PPSV23 vaccine at age 65 years or later if at least 5 years have passed since the previous dose. Doses of PPSV23 are not needed for people immunized with PPSV23 at or after age 65 years.  Meningococcal vaccine. Adults with asplenia or persistent complement component deficiencies should receive 2 doses of quadrivalent meningococcal conjugate (MenACWY-D) vaccine. The doses should be obtained at least 2 months apart.  Microbiologists working with certain meningococcal bacteria, military recruits, people at risk during an outbreak, and people who travel to or live in countries with a high rate of meningitis should be immunized. A first-year college student up through age   21 years who is living in a residence hall should receive a dose if she did not receive a dose on or after her 16th birthday. Adults who have certain high-risk conditions should receive one or more doses of vaccine.  Hepatitis A vaccine. Adults who wish to be protected from this disease, have certain high-risk conditions, work with hepatitis A-infected animals, work in hepatitis A research labs, or travel to or work in countries with a high rate of hepatitis A should be immunized. Adults who were previously unvaccinated and who anticipate close contact with an international adoptee during the first 60 days after arrival in the [place] from a country with a high rate of hepatitis A should be immunized.  Hepatitis B vaccine. Adults who wish to be protected from this disease, have certain high-risk conditions, may be exposed to blood or other infectious body fluids, are household contacts or sex partners of hepatitis B positive people, are clients or workers in certain care facilities, or travel to or work in countries with a high rate of hepatitis B should be immunized.  Haemophilus influenzae type b (Hib) vaccine. A previously unvaccinated person with asplenia or sickle cell disease or having a scheduled splenectomy should receive 1 dose of Hib vaccine. Regardless of previous immunization, a recipient of a hematopoietic stem cell transplant should receive a 3-dose series 6-12 months after her successful transplant. Hib vaccine is not recommended for adults with HIV infection. Preventive Services / Frequency Ages 64 to 68 years  Blood pressure check.** / Every 1 to 2 years.  Lipid and cholesterol check.** / Every 5 years beginning at age  22.  Clinical breast exam.** / Every 3 years for women in their 88s and 53s.  BRCA-related cancer risk assessment.** / For women who have family members with a BRCA-related cancer (breast, ovarian, tubal, or peritoneal cancers).  Pap test.** / Every 2 years from ages 90 through 51. Every 3 years starting at age 21 through age 56 or 3 with a history of 3 consecutive normal Pap tests.  HPV screening.** / Every 3 years from ages 24 through ages 1 to 46 with a history of 3 consecutive normal Pap tests.  Hepatitis C blood test.** / For any individual with known risks for hepatitis C.  Skin self-exam. / Monthly.  Influenza vaccine. / Every year.  Tetanus, diphtheria, and acellular pertussis (Tdap, Td) vaccine.** / Consult your health care provider. Pregnant women should receive 1 dose of Tdap vaccine during each pregnancy. 1 dose of Td every 10 years.  Varicella vaccine.** / Consult your health care provider. Pregnant females who do not have evidence of immunity should receive the first dose after pregnancy.  HPV vaccine. / 3 doses over 6 months, if 72 and younger. The vaccine is not recommended for use in pregnant females. However, pregnancy testing is not needed before receiving a dose.  Measles, mumps, rubella (MMR) vaccine.** / You need at least 1 dose of MMR if you were born in 1957 or later. You may also need a 2nd dose. For females of childbearing age, rubella immunity should be determined. If there is no evidence of immunity, females who are not pregnant should be vaccinated. If there is no evidence of immunity, females who are pregnant should delay immunization until after pregnancy.  Pneumococcal 13-valent conjugate (PCV13) vaccine.** / Consult your health care provider.  Pneumococcal polysaccharide (PPSV23) vaccine.** / 1 to 2 doses if you smoke cigarettes or if you have certain conditions.  Meningococcal vaccine.** /  1 dose if you are age 19 to 21 years and a first-year college  student living in a residence hall, or have one of several medical conditions, you need to get vaccinated against meningococcal disease. You may also need additional booster doses.  Hepatitis A vaccine.** / Consult your health care provider.  Hepatitis B vaccine.** / Consult your health care provider.  Haemophilus influenzae type b (Hib) vaccine.** / Consult your health care provider. Ages 40 to 64 years  Blood pressure check.** / Every 1 to 2 years.  Lipid and cholesterol check.** / Every 5 years beginning at age 20 years.  Lung cancer screening. / Every year if you are aged 55-80 years and have a 30-pack-year history of smoking and currently smoke or have quit within the past 15 years. Yearly screening is stopped once you have quit smoking for at least 15 years or develop a health problem that would prevent you from having lung cancer treatment.  Clinical breast exam.** / Every year after age 40 years.  BRCA-related cancer risk assessment.** / For women who have family members with a BRCA-related cancer (breast, ovarian, tubal, or peritoneal cancers).  Mammogram.** / Every year beginning at age 40 years and continuing for as long as you are in good health. Consult with your health care provider.  Pap test.** / Every 3 years starting at age 30 years through age 65 or 70 years with a history of 3 consecutive normal Pap tests.  HPV screening.** / Every 3 years from ages 30 years through ages 65 to 70 years with a history of 3 consecutive normal Pap tests.  Fecal occult blood test (FOBT) of stool. / Every year beginning at age 50 years and continuing until age 75 years. You may not need to do this test if you get a colonoscopy every 10 years.  Flexible sigmoidoscopy or colonoscopy.** / Every 5 years for a flexible sigmoidoscopy or every 10 years for a colonoscopy beginning at age 50 years and continuing until age 75 years.  Hepatitis C blood test.** / For all people born from 1945 through  1965 and any individual with known risks for hepatitis C.  Skin self-exam. / Monthly.  Influenza vaccine. / Every year.  Tetanus, diphtheria, and acellular pertussis (Tdap/Td) vaccine.** / Consult your health care provider. Pregnant women should receive 1 dose of Tdap vaccine during each pregnancy. 1 dose of Td every 10 years.  Varicella vaccine.** / Consult your health care provider. Pregnant females who do not have evidence of immunity should receive the first dose after pregnancy.  Zoster vaccine.** / 1 dose for adults aged 60 years or older.  Measles, mumps, rubella (MMR) vaccine.** / You need at least 1 dose of MMR if you were born in 1957 or later. You may also need a 2nd dose. For females of childbearing age, rubella immunity should be determined. If there is no evidence of immunity, females who are not pregnant should be vaccinated. If there is no evidence of immunity, females who are pregnant should delay immunization until after pregnancy.  Pneumococcal 13-valent conjugate (PCV13) vaccine.** / Consult your health care provider.  Pneumococcal polysaccharide (PPSV23) vaccine.** / 1 to 2 doses if you smoke cigarettes or if you have certain conditions.  Meningococcal vaccine.** / Consult your health care provider.  Hepatitis A vaccine.** / Consult your health care provider.  Hepatitis B vaccine.** / Consult your health care provider.  Haemophilus influenzae type b (Hib) vaccine.** / Consult your health care provider. Ages 65   years and over  Blood pressure check.** / Every 1 to 2 years.  Lipid and cholesterol check.** / Every 5 years beginning at age 22 years.  Lung cancer screening. / Every year if you are aged 73-80 years and have a 30-pack-year history of smoking and currently smoke or have quit within the past 15 years. Yearly screening is stopped once you have quit smoking for at least 15 years or develop a health problem that would prevent you from having lung cancer  treatment.  Clinical breast exam.** / Every year after age 4 years.  BRCA-related cancer risk assessment.** / For women who have family members with a BRCA-related cancer (breast, ovarian, tubal, or peritoneal cancers).  Mammogram.** / Every year beginning at age 40 years and continuing for as long as you are in good health. Consult with your health care provider.  Pap test.** / Every 3 years starting at age 9 years through age 34 or 91 years with 3 consecutive normal Pap tests. Testing can be stopped between 65 and 70 years with 3 consecutive normal Pap tests and no abnormal Pap or HPV tests in the past 10 years.  HPV screening.** / Every 3 years from ages 57 years through ages 64 or 45 years with a history of 3 consecutive normal Pap tests. Testing can be stopped between 65 and 70 years with 3 consecutive normal Pap tests and no abnormal Pap or HPV tests in the past 10 years.  Fecal occult blood test (FOBT) of stool. / Every year beginning at age 15 years and continuing until age 17 years. You may not need to do this test if you get a colonoscopy every 10 years.  Flexible sigmoidoscopy or colonoscopy.** / Every 5 years for a flexible sigmoidoscopy or every 10 years for a colonoscopy beginning at age 86 years and continuing until age 71 years.  Hepatitis C blood test.** / For all people born from [birth year] through 1964 and any individual with known risks for hepatitis C.  Osteoporosis screening.** / A one-time screening for women ages 83 years and over and women at risk for fractures or osteoporosis.  Skin self-exam. / Monthly.  Influenza vaccine. / Every year.  Tetanus, diphtheria, and acellular pertussis (Tdap/Td) vaccine.** / 1 dose of Td every 10 years.  Varicella vaccine.** / Consult your health care provider.  Zoster vaccine.** / 1 dose for adults aged 61 years or older.  Pneumococcal 13-valent conjugate (PCV13) vaccine.** / Consult your health care provider.  Pneumococcal  polysaccharide (PPSV23) vaccine.** / 1 dose for all adults aged 28 years and older.  Meningococcal vaccine.** / Consult your health care provider.  Hepatitis A vaccine.** / Consult your health care provider.  Hepatitis B vaccine.** / Consult your health care provider.  Haemophilus influenzae type b (Hib) vaccine.** / Consult your health care provider. ** Family history and personal history of risk and conditions may change your health care provider's recommendations. Document Released: 11/28/2001 Document Revised: 02/16/2014 Document Reviewed: 02/27/2011 [facility] Patient Information 2015 [place], [place]. This information is not intended to replace advice given to you by your health care provider. Make sure you discuss any questions you have with your health care provider.

## 2014-09-25 NOTE — Assessment & Plan Note (Signed)

## 2014-09-25 NOTE — Progress Notes (Signed)
Pre visit review using our clinic review tool, if applicable. No additional management support is needed unless otherwise documented below in the visit note. 

## 2014-09-25 NOTE — Addendum Note (Signed)
Addended by: Cresenciano Lick on: 09/25/2014 11:08 AM   Modules accepted: Orders

## 2014-09-25 NOTE — Assessment & Plan Note (Signed)
Labs

## 2014-11-24 ENCOUNTER — Other Ambulatory Visit: Payer: Self-pay | Admitting: Internal Medicine

## 2014-11-27 NOTE — Telephone Encounter (Signed)
Called pharmacy spoke with fushmir gave md approval.../lmb

## 2014-12-08 ENCOUNTER — Other Ambulatory Visit: Payer: Self-pay | Admitting: *Deleted

## 2014-12-08 MED ORDER — FUROSEMIDE 20 MG PO TABS: ORAL_TABLET | ORAL | Status: DC

## 2015-01-12 ENCOUNTER — Emergency Department (HOSPITAL_COMMUNITY)
Admission: EM | Admit: 2015-01-12 | Discharge: 2015-01-12 | Disposition: A | Discharge status: Home / Self Care | Payer: Commercial Managed Care - HMO | Source: Home / Self Care | Attending: Family Medicine | Admitting: Family Medicine

## 2015-01-12 ENCOUNTER — Encounter (HOSPITAL_COMMUNITY): Payer: Self-pay | Admitting: *Deleted

## 2015-01-12 DIAGNOSIS — R319 Hematuria, unspecified: Secondary | ICD-10-CM | POA: Diagnosis not present

## 2015-01-12 LAB — POCT URINALYSIS DIP (DEVICE)
BILIRUBIN URINE: NEGATIVE
Glucose, UA: NEGATIVE mg/dL
HGB URINE DIPSTICK: NEGATIVE
KETONES UR: NEGATIVE mg/dL
LEUKOCYTES UA: NEGATIVE
Nitrite: NEGATIVE
PH: 5.5 (ref 5.0–8.0)
PROTEIN: NEGATIVE mg/dL
Specific Gravity, Urine: 1.01 (ref 1.005–1.030)
Urobilinogen, UA: 0.2 mg/dL (ref 0.0–1.0)

## 2015-01-12 NOTE — ED Provider Notes (Signed)
CSN: [id number]     Arrival date & time 01/12/15  1437 History   First MD Initiated Contact with Patient 01/12/15 1556     Chief Complaint  Patient presents with  . Flank Pain   (Consider location/radiation/quality/duration/timing/severity/associated sxs/prior Treatment) Patient is a 74 y.o. female presenting with flank pain. The history is provided by the patient.  Flank Pain This is a new problem. The current episode started more than 1 week ago (3 wks of sx. comes today for eval.). The problem has not changed since onset.Pertinent negatives include no chest pain and no abdominal pain.    Past Medical History  Diagnosis Date  . Unspecified essential hypertension   . Mitral valve disorders   . Palpitations   . Other and unspecified hyperlipidemia   . Other abnormal glucose   . Malignant neoplasm of anal canal   . Chronic hepatitis, unspecified   . Disorder of bone and cartilage, unspecified   . Anxiety state, unspecified   . Anemia, unspecified    Past Surgical History  Procedure Laterality Date  . Cholecystectomy    . Tonsillectomy    . Breast lumpectomy      right   Family History  Problem Relation Age of Onset  . Prostate cancer Brother   . Stroke Father   . Breast cancer Sister    History  Substance Use Topics  . Smoking status: Never Smoker   . Smokeless tobacco: Never Used  . Alcohol Use: No   OB History    No data available     Review of Systems  Constitutional: Negative.   Cardiovascular: Negative for chest pain.  Gastrointestinal: Negative.  Negative for abdominal pain.  Genitourinary: Positive for hematuria and flank pain. Negative for dysuria, urgency and frequency.  Musculoskeletal: Negative.     Allergies  Review of patient's allergies indicates no known allergies.  Home Medications   Prior to Admission medications   Medication Sig Start Date End Date Taking? Authorizing Provider  atenolol (TENORMIN) 50 MG tablet TAKE 1 TABLET DAILY  07/09/14   Aleksei Plotnikov V, MD  calcium carbonate (TUMS - DOSED IN MG ELEMENTAL CALCIUM) 500 MG chewable tablet Chew 1 tablet by mouth daily.      Historical Provider, MD  Cholecalciferol (VITAMIN D3) 2000 UNITS capsule Take 4,000 Units by mouth daily.     Historical Provider, MD  clorazepate (TRANXENE) 7.5 MG tablet take 1 tablet by mouth three times a day if needed for anxiety 11/26/14   Aleksei Plotnikov V, MD  fish oil-omega-3 fatty acids 1000 MG capsule Take 1 g by mouth daily.      Historical Provider, MD  furosemide (LASIX) 20 MG tablet TAKE 1 TABLET DAILY 12/08/14   Aleksei Plotnikov V, MD  Garlic Oil 4193 MG CAPS Take 1 capsule by mouth daily.      Historical Provider, MD  lisinopril (PRINIVIL,ZESTRIL) 20 MG tablet take 1 tablet by mouth once daily 07/09/14   Cassandria Anger, MD  Multiple Vitamins-Minerals (CENTRUM SILVER PO) Take 1 capsule by mouth daily.      Historical Provider, MD  potassium chloride SA (K-DUR,KLOR-CON) 20 MEQ tablet Take 1 tablet (20 mEq total) by mouth daily. 03/04/14   Noralee Space, MD  predniSONE (DELTASONE) 5 MG tablet Take 0.5 tablets (2.5 mg total) by mouth daily. 06/19/14   Aleksei Plotnikov V, MD  raloxifene (EVISTA) 60 MG tablet Take 60 mg by mouth daily.      Historical Provider, MD  BP 193/90 mmHg  Pulse 88  Temp(Src) 98.6 F (37 C) (Oral)  Resp 12  SpO2 98% Physical Exam  Constitutional: She appears well-developed and well-nourished. No distress.  Abdominal: Soft. Bowel sounds are normal. She exhibits no distension and no mass. There is no tenderness. There is no rebound and no guarding.  Skin: Skin is warm and dry.  Nursing note and vitals reviewed.   ED Course  Procedures (including critical care time) Labs Review Labs Reviewed  POCT URINALYSIS DIP (DEVICE)    Imaging Review No results found.   MDM   1. Hematuria of undiagnosed cause        Billy Fischer, MD 01/12/15 [phone]

## 2015-01-12 NOTE — Discharge Instructions (Signed)
Call urologist in am for follow-up appt and further eval as needed.

## 2015-01-12 NOTE — ED Notes (Signed)
Pt  Reports   Pain  r  Flank  Pain  X  3  Weeks     With     Pink tinged       Urine           Pt  Reports        No  Unusual         Output            No  Injury

## 2015-01-12 NOTE — ED Notes (Signed)
INFO  FAXED  TO  [facility]

## 2015-01-13 ENCOUNTER — Telehealth: Payer: Self-pay | Admitting: Internal Medicine

## 2015-01-13 NOTE — Telephone Encounter (Signed)
Patient is requesting a follow up call once referral has been sent

## 2015-01-13 NOTE — Telephone Encounter (Signed)
Patient went to Urgent Care yesterday blood in her Urine.  Urgent Care wants her to see Dr. Gaynelle Arabian with [facility].  She has an appointment scheduled for Monday 4/4.  She is needing a [facility] referral.

## 2015-01-15 NOTE — Telephone Encounter (Signed)
[employer] auth # [id number] valid 01/18/15-07/17/15 for 6 visits

## 2015-04-02 ENCOUNTER — Ambulatory Visit (INDEPENDENT_AMBULATORY_CARE_PROVIDER_SITE_OTHER): Payer: Commercial Managed Care - HMO | Admitting: Internal Medicine

## 2015-04-02 ENCOUNTER — Other Ambulatory Visit: Payer: Self-pay | Admitting: Internal Medicine

## 2015-04-02 ENCOUNTER — Encounter: Payer: Self-pay | Admitting: Internal Medicine

## 2015-04-02 VITALS — BP 150/82 | HR 71 | Wt 180.0 lb

## 2015-04-02 DIAGNOSIS — C55 Malignant neoplasm of uterus, part unspecified: Secondary | ICD-10-CM | POA: Diagnosis not present

## 2015-04-02 DIAGNOSIS — I10 Essential (primary) hypertension: Secondary | ICD-10-CM

## 2015-04-02 MED ORDER — IRBESARTAN-HYDROCHLOROTHIAZIDE 300-12.5 MG PO TABS
1.0000 | ORAL_TABLET | Freq: Every day | ORAL | Status: DC
Start: 2015-04-02 — End: 2015-04-02

## 2015-04-02 MED ORDER — CLORAZEPATE DIPOTASSIUM 7.5 MG PO TABS: 7.5000 mg | ORAL_TABLET | Freq: Two times a day (BID) | ORAL | Status: DC | PRN

## 2015-04-02 MED ORDER — POTASSIUM CHLORIDE CRYS ER 20 MEQ PO TBCR: 20.0000 meq | EXTENDED_RELEASE_TABLET | Freq: Every day | ORAL | Status: DC

## 2015-04-02 MED ORDER — ATENOLOL 50 MG PO TABS: ORAL_TABLET | ORAL | Status: DC

## 2015-04-02 MED ORDER — FUROSEMIDE 20 MG PO TABS: ORAL_TABLET | ORAL | Status: DC

## 2015-04-02 NOTE — Progress Notes (Signed)
   Subjective:    HPI  C/o new dx - uterine ca - Dr Garwin Brothers; surgery pending 04/20/2023) Her sister and brother died in 02/01/2014 The patient presents to address her  chronic hypertension, chronic dyslipidemia, hepatitis on steroids controlled  BP Readings from Last 3 Encounters:  04/02/15 150/82  01/12/15 193/90  09/25/14 158/90   Wt Readings from Last 3 Encounters:  04/02/15 180 lb (81.647 kg)  09/25/14 186 lb (84.369 kg)  03/20/14 179 lb (81.194 kg)      Review of Systems  Constitutional: Negative for chills, activity change, appetite change, fatigue and unexpected weight change.  HENT: Negative for congestion, ear pain, mouth sores, sinus pressure and voice change.   Eyes: Negative for visual disturbance.  Respiratory: Negative for cough, chest tightness and wheezing.   Cardiovascular: Negative for chest pain.  Gastrointestinal: Negative for nausea, vomiting and abdominal pain.  Genitourinary: Negative for urgency, frequency, difficulty urinating and vaginal pain.  Musculoskeletal: Negative for back pain, gait problem and neck pain.  Skin: Positive for wound. Negative for pallor and rash.  Neurological: Negative for dizziness, tremors, weakness, numbness and headaches.  Psychiatric/Behavioral: Positive for decreased concentration. Negative for suicidal ideas, confusion and sleep disturbance. The patient is nervous/anxious.        Objective:   Physical Exam  Constitutional: She appears well-developed. No distress.  HENT:  Head: Normocephalic.  Right Ear: External ear normal.  Left Ear: External ear normal.  Nose: Nose normal.  Mouth/Throat: Oropharynx is clear and moist.  Eyes: Conjunctivae are normal. Pupils are equal, round, and reactive to light. Right eye exhibits no discharge. Left eye exhibits no discharge.  Neck: Normal range of motion. Neck supple. No JVD present. No tracheal deviation present. No thyromegaly present.  Cardiovascular: Normal rate, regular rhythm and  normal heart sounds.   Pulmonary/Chest: No stridor. No respiratory distress. She has no wheezes.  Abdominal: Soft. Bowel sounds are normal. She exhibits no distension and no mass. There is no tenderness. There is no rebound and no guarding.  Musculoskeletal: She exhibits no edema or tenderness.  Lymphadenopathy:    She has no cervical adenopathy.  Neurological: She displays normal reflexes. No cranial nerve deficit. She exhibits normal muscle tone. Coordination normal.  Skin: No rash noted. No erythema.  Psychiatric: She has a normal mood and affect. Her behavior is normal. Judgment and thought content normal.    Lab Results  Component Value Date   WBC 3.9* 09/25/2014   HGB 12.6 09/25/2014   HCT 38.9 09/25/2014   PLT 214.0 09/25/2014   GLUCOSE 107* 09/25/2014   CHOL 185 09/25/2014   TRIG 86.0 09/25/2014   HDL 63.70 09/25/2014   LDLDIRECT 100.6 02/13/2012   LDLCALC 104* 09/25/2014   ALT 20 09/25/2014   AST 32 09/25/2014   NA 137 09/25/2014   K 4.0 09/25/2014   CL 107 09/25/2014   CREATININE 0.8 09/25/2014   BUN 13 09/25/2014   CO2 27 09/25/2014   TSH 1.25 09/25/2014   HGBA1C 5.4 02/13/2013         Assessment & Plan:

## 2015-04-02 NOTE — Assessment & Plan Note (Signed)
Chronic - not well controlled Stop Lisinopril Start Avalide, cont Atenolol

## 2015-04-02 NOTE — Assessment & Plan Note (Signed)
6/16 Dr Garwin Brothers Onc ref pending next week

## 2015-04-02 NOTE — Progress Notes (Signed)
Pre visit review using our clinic review tool, if applicable. No additional management support is needed unless otherwise documented below in the visit note. 

## 2015-04-08 ENCOUNTER — Encounter: Payer: Self-pay | Admitting: Gynecologic Oncology

## 2015-04-08 ENCOUNTER — Ambulatory Visit (HOSPITAL_BASED_OUTPATIENT_CLINIC_OR_DEPARTMENT_OTHER): Payer: Commercial Managed Care - HMO

## 2015-04-08 ENCOUNTER — Ambulatory Visit: Payer: Commercial Managed Care - HMO | Attending: Gynecologic Oncology | Admitting: Gynecologic Oncology

## 2015-04-08 VITALS — BP 159/81 | HR 77 | Temp 98.6°F | Resp 18 | Ht 65.0 in | Wt 174.2 lb

## 2015-04-08 DIAGNOSIS — C541 Malignant neoplasm of endometrium: Secondary | ICD-10-CM

## 2015-04-08 LAB — BUN AND CREATININE (CC13)
BUN: 9.2 mg/dL (ref 7.0–26.0)
CREATININE: 0.9 mg/dL (ref 0.6–1.1)
EGFR: 67 mL/min/{1.73_m2} — AB (ref >90)

## 2015-04-08 NOTE — Addendum Note (Signed)
Addended by: Everitt Amber C on: 04/08/2015 01:22 PM   Modules accepted: Level of Service

## 2015-04-08 NOTE — Patient Instructions (Signed)
Preparing for your Surgery  Plan for surgery on July 5 with Dr. Skeet Latch.  Pre-operative Testing -You will receive a phone call from presurgical testing at [facility] to arrange for a pre-operative testing appointment before your surgery.  This appointment normally occurs one to two weeks before your scheduled surgery.   -Bring your insurance card, copy of an advanced directive if applicable, medication list  -At that visit, you will be asked to sign a consent for a possible blood transfusion in case a transfusion becomes necessary during surgery.  The need for a blood transfusion is rare but having consent is a necessary part of your care.     -You should not be taking blood thinners or aspirin at least ten days prior to surgery unless instructed by your surgeon.  Day Before Surgery at [facility] will be asked to take in only clear liquids the day before surgery.  Examples of clear liquids include broths, jello, and clear juices.  You will be advised to have nothing to eat or drink after midnight the evening before.    Your role in recovery Your role is to become active as soon as directed by your doctor, while still giving yourself time to heal.  Rest when you feel tired. You will be asked to do the following in order to speed your recovery:  - Cough and breathe deeply. This helps toclear and expand your lungs and can prevent pneumonia. You may be given a spirometer to practice deep breathing. A staff member will show you how to use the spirometer. - Do mild physical activity. Walking or moving your legs help your circulation and body functions return to normal. A staff member will help you when you try to walk and will provide you with simple exercises. Do not try to get up or walk alone the first time. - Actively manage your pain. Managing your pain lets you move in comfort. We will ask you to rate your pain on a scale of zero to 10. It is your responsibility to tell  your doctor or nurse where and how much you hurt so your pain can be treated.  Special Considerations -If you are diabetic, you may be placed on insulin after surgery to have closer control over your blood sugars to promote healing and recovery.  This does not mean that you will be discharged on insulin.  If applicable, your oral antidiabetics will be resumed when you are tolerating a solid diet.  -Your final pathology results from surgery should be available by the Friday after surgery and the results will be relayed to you when available.  Blood Transfusion Information WHAT IS A BLOOD TRANSFUSION? A transfusion is the replacement of blood or some of its parts. Blood is made up of multiple cells which provide different functions.  Red blood cells carry oxygen and are used for blood loss replacement.  White blood cells fight against infection.  Platelets control bleeding.  Plasma helps clot blood.  Other blood products are available for specialized needs, such as hemophilia or other clotting disorders. BEFORE THE TRANSFUSION  Who gives blood for transfusions?   You may be able to donate blood to be used at a later date on yourself (autologous donation).  Relatives can be asked to donate blood. This is generally not any safer than if you have received blood from a stranger. The same precautions are taken to ensure safety when a relative's blood is donated.  Healthy volunteers who are fully  evaluated to make sure their blood is safe. This is blood bank blood. Transfusion therapy is the safest it has ever been in the practice of medicine. Before blood is taken from a donor, a complete history is taken to make sure that person has no history of diseases nor engages in risky social behavior (examples are intravenous drug use or sexual activity with multiple partners). The donor's travel history is screened to minimize risk of transmitting infections, such as malaria. The donated blood is  tested for signs of infectious diseases, such as HIV and hepatitis. The blood is then tested to be sure it is compatible with you in order to minimize the chance of a transfusion reaction. If you or a relative donates blood, this is often done in anticipation of surgery and is not appropriate for emergency situations. It takes many days to process the donated blood. RISKS AND COMPLICATIONS Although transfusion therapy is very safe and saves many lives, the main dangers of transfusion include:   Getting an infectious disease.  Developing a transfusion reaction. This is an allergic reaction to something in the blood you were given. Every precaution is taken to prevent this. The decision to have a blood transfusion has been considered carefully by your caregiver before blood is given. Blood is not given unless the benefits outweigh the risks.

## 2015-04-08 NOTE — Progress Notes (Signed)
Consult Note: Gyn-Onc  Consult was requested by Dr. Garwin Brothers  for the evaluation of Sharon Holt 74 y.o. female  CC:  Chief Complaint  Patient presents with  . endometrial cancer    New consult    Assessment/Plan:  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   A detailed discussion was held with the patient and her family with regard to to her endometrial cancer diagnosis. We discussed the standard management options for uterine cancer which includes surgery followed possibly by adjuvant therapy depending on the results of surgery. The options for surgical management include a hysterectomy and removal of the tubes and ovaries possibly with removal of pelvic and para-aortic lymph nodes. A minimally invasive approach including a robotic hysterectomy or laparoscopic hysterectomy have benefits including shorter hospital stay, recovery time and better wound healing. The alternative approach is an open hysterectomy. The patient has been counseled about these surgical options and the risks of surgery in general including infection, bleeding, damage to surrounding structures (including bowel, bladder, ureters, nerves or vessels), and the postoperative risks of PE/ DVT, and lymphedema. I extensively reviewed the additional risks of robotic hysterectomy including possible need for conversion to open laparotomy.  I discussed positioning during surgery of trendelenberg and risks of minor facial swelling and care we take in preoperative positioning.  After counseling and consideration of her options, she desires to proceed with robotic hysterectomy, BSO, pelvic and para-aortic lymphadenectomy.   She will be seen by anesthesia for preoperative clearance and discussion of postoperative pain management.  She was given the opportunity to ask questions, which were answered to her satisfaction, and she is agreement with the above mentioned plan of care.  We will schedule  her for a preoperative CA 125 and CT imaging of the chest, abdomen and pelvis because she has a high grade cancer that puts her at increased risk for metastatic.  HPI: Sharon Holt is a 74 year old [id number] who is seen in consultation at the request of Dr Garwin Brothers for grade 3 serous endometrial cancer.  She  is a 2 month history of vaginal spotting. She saw Dr. cousins for her scheduled annual visits in May 2016. And had a Pap performed which was positive for AGUS. She then underwent ultrasound evaluation of the and he treated him which revealed a thickened endometrial stripe at 8.8 mm. The uterus itself measured 8 x 5 x 6 cm. An endometrial biopsy was performed on 03/12/2015 which revealed high-grade adenocarcinoma with papillary features architecturally however the immunostains were not typical for serous adenocarcinoma.  Interval History: Her nephew committed suicide this morning.  Current Meds:  Outpatient Encounter Prescriptions as of 04/08/2015  Medication Sig  . atenolol (TENORMIN) 50 MG tablet TAKE 1 TABLET DAILY  . Cholecalciferol (VITAMIN D3) 2000 UNITS capsule Take 4,000 Units by mouth daily.   . clorazepate (TRANXENE) 7.5 MG tablet Take 1 tablet (7.5 mg total) by mouth 2 (two) times daily as needed for anxiety.  . fish oil-omega-3 fatty acids 1000 MG capsule Take 1 g by mouth daily.    . Garlic Oil 8786 MG CAPS Take 1 capsule by mouth daily.    . irbesartan-hydrochlorothiazide (AVALIDE) 300-12.5 MG per tablet take 1 tablet by mouth once daily  . potassium chloride SA (K-DUR,KLOR-CON) 20 MEQ tablet Take 1 tablet (20 mEq total) by mouth daily.  . predniSONE (DELTASONE) 5 MG tablet Take 0.5 tablets (2.5 mg total) by mouth daily.  Marland Kitchen  raloxifene (EVISTA) 60 MG tablet Take 60 mg by mouth daily.    . Multiple Vitamins-Minerals (CENTRUM SILVER PO) Take 1 capsule by mouth daily.    . [DISCONTINUED] calcium carbonate (TUMS - DOSED IN MG ELEMENTAL CALCIUM) 500 MG chewable tablet Chew 1  tablet by mouth daily.    . [DISCONTINUED] furosemide (LASIX) 20 MG tablet TAKE 1 TABLET DAILY prn swelling  . [DISCONTINUED] lisinopril (PRINIVIL,ZESTRIL) 20 MG tablet    No facility-administered encounter medications on file as of 04/08/2015.    Allergy: No Known Allergies  Social Hx:   History   Social History  . Marital Status: Married    Spouse Name: Zo Loudon  . Number of Children: N/A  . Years of Education: N/A   Occupational History  . Retired     Company secretary   Social History Main Topics  . Smoking status: Never Smoker   . Smokeless tobacco: Never Used  . Alcohol Use: No  . Drug Use: No  . Sexual Activity: Not on file   Other Topics Concern  . Not on file   Social History Narrative    Past Surgical Hx:  Past Surgical History  Procedure Laterality Date  . Cholecystectomy    . Tonsillectomy    . Breast lumpectomy      right    Past Medical Hx:  Past Medical History  Diagnosis Date  . Unspecified essential hypertension   . Mitral valve disorders   . Palpitations   . Other and unspecified hyperlipidemia   . Other abnormal glucose   . Malignant neoplasm of anal canal   . Chronic hepatitis, unspecified   . Disorder of bone and cartilage, unspecified   . Anxiety state, unspecified   . Anemia, unspecified     Past Gynecological History:  SVD x 4  No LMP recorded. Patient is postmenopausal.  Family Hx:  Family History  Problem Relation Age of Onset  . Prostate cancer Brother   . Stroke Father   . Breast cancer Sister     Review of Systems:  Constitutional  Feels well,    ENT Normal appearing ears and nares bilaterally Skin/Breast  No rash, sores, jaundice, itching, dryness Cardiovascular  No chest pain, shortness of breath, or edema  Pulmonary  No cough or wheeze.  Gastro Intestinal  No nausea, vomitting, or diarrhoea. No bright red blood per rectum, no abdominal pain, change in bowel movement, or constipation.  Genito Urinary  No  frequency, urgency, dysuria,  Musculo Skeletal  No myalgia, arthralgia, joint swelling or pain  Neurologic  No weakness, numbness, change in gait,  Psychology  No depression, anxiety, insomnia.   Vitals:  Blood pressure 159/81, pulse 77, temperature 98.6 F (37 C), temperature source Oral, resp. rate 18, height 5\' 5"  (1.651 m), weight 174 lb 3.2 oz (79.017 kg), SpO2 100 %.  Physical Exam: WD in NAD Neck  Supple NROM, without any enlargements.  Lymph Node Survey No cervical supraclavicular or inguinal adenopathy Cardiovascular  Pulse normal rate, regularity and rhythm. S1 and S2 normal.  Lungs  Clear to auscultation bilateraly, without wheezes/crackles/rhonchi. Good air movement.  Skin  No rash/lesions/breakdown  Psychiatry  Alert and oriented to person, place, and time  Abdomen  Normoactive bowel sounds, abdomen soft, non-tender and overweight without evidence of hernia.  Back No CVA tenderness Genito Urinary  Vulva/vagina: Normal external female genitalia.  No lesions. No discharge or bleeding.  Bladder/urethra:  No lesions or masses, well supported bladder  Vagina: normal,  no lesions  Cervix: Normal appearing, no lesions.  Uterus: Small, mobile, no parametrial involvement or nodularity.  Adnexa: no palpable masses. Rectal  Good tone, no masses no cul de sac nodularity.  Extremities  No bilateral cyanosis, clubbing or edema.   [name], MD   04/08/2015, 1:15 PM

## 2015-04-09 LAB — CA 125: CA 125: 7 U/mL (ref <35)

## 2015-04-12 ENCOUNTER — Other Ambulatory Visit (HOSPITAL_COMMUNITY): Payer: Self-pay | Admitting: *Deleted

## 2015-04-12 NOTE — Patient Instructions (Addendum)
YOUR PROCEDURE IS SCHEDULED ON :  04/20/15     REPORT TO [facility] MAIN ENTRANCE FOLLOW SIGNS TO EAST ELEVATOR - GO TO 3rd FLOOR CHECK IN AT 3 EAST NURSES STATION (SHORT STAY) AT:  11:00 AM  CALL THIS NUMBER IF YOU HAVE PROBLEMS THE MORNING OF SURGERY [phone]  REMEMBER:ONLY 1 PER PERSON MAY GO TO SHORT STAY WITH YOU TO GET READY THE MORNING OF YOUR SURGERY  DO NOT EAT FOOD  AFTER MID NIGHT    CLEAR LIQUIDS ONLY THE DAY BEFORE SURGERY  TAKE THESE MEDICINES THE MORNING OF SURGERY:  ATENOLOL / CLORAZEPATE / PREDNISONE / EVISTA  YOU MAY NOT HAVE ANY METAL ON YOUR BODY INCLUDING HAIR PINS AND PIERCING'S. DO NOT WEAR JEWELRY, MAKEUP, LOTIONS, POWDERS OR PERFUMES. DO NOT WEAR NAIL POLISH. DO NOT SHAVE 48 HRS PRIOR TO SURGERY. MEN MAY SHAVE FACE AND NECK.  DO NOT [place]. [facility] IS NOT RESPONSIBLE FOR VALUABLES.  CONTACTS, DENTURES OR PARTIALS MAY NOT BE WORN TO SURGERY. LEAVE SUITCASE IN CAR. CAN BE BROUGHT TO ROOM AFTER SURGERY.  PATIENTS DISCHARGED THE DAY OF SURGERY WILL NOT BE ALLOWED TO DRIVE HOME.  PLEASE READ OVER THE FOLLOWING INSTRUCTION SHEETS _________________________________________________________________________________                                          [place] - PREPARING FOR SURGERY  Before surgery, you can play an important role.  Because skin is not sterile, your skin needs to be as free of germs as possible.  You can reduce the number of germs on your skin by washing with CHG (chlorahexidine gluconate) soap before surgery.  CHG is an antiseptic cleaner which kills germs and bonds with the skin to continue killing germs even after washing. Please DO NOT use if you have an allergy to CHG or antibacterial soaps.  If your skin becomes reddened/irritated stop using the CHG and inform your nurse when you arrive at Short Stay. Do not shave (including legs and underarms) for at least 48 hours prior to the first CHG  shower.  You may shave your face. Please follow these instructions carefully:   1.  Shower with CHG Soap the night before surgery and the  morning of Surgery.   2.  If you choose to wash your hair, wash your hair first as usual with your  normal  Shampoo.   3.  After you shampoo, rinse your hair and body thoroughly to remove the  shampoo.                                         4.  Use CHG as you would any other liquid soap.  You can apply chg directly  to the skin and wash . Gently wash with scrungie or clean wascloth    5.  Apply the CHG Soap to your body ONLY FROM THE NECK DOWN.   Do not use on open                           Wound or open sores. Avoid contact with eyes, ears mouth and genitals (private parts).  Genitals (private parts) with your normal soap.              6.  Wash thoroughly, paying special attention to the area where your surgery  will be performed.   7.  Thoroughly rinse your body with warm water from the neck down.   8.  DO NOT shower/wash with your normal soap after using and rinsing off  the CHG Soap .                9.  Pat yourself dry with a clean towel.             10.  Wear clean night clothes to bed after shower             11.  Place clean sheets on your bed the night of your first shower and do not  sleep with pets.  Day of Surgery : Do not apply any lotions/deodorants the morning of surgery.  Please wear clean clothes to the hospital/surgery center.  FAILURE TO FOLLOW THESE INSTRUCTIONS MAY RESULT IN THE CANCELLATION OF YOUR SURGERY    PATIENT SIGNATURE_________________________________  ______________________________________________________________________     Sharon Holt  An incentive spirometer is a tool that can help keep your lungs clear and active. This tool measures how well you are filling your lungs with each breath. Taking long deep breaths may help reverse or decrease the chance of developing  breathing (pulmonary) problems (especially infection) following:  A long period of time when you are unable to move or be active. BEFORE THE PROCEDURE   If the spirometer includes an indicator to show your best effort, your nurse or respiratory therapist will set it to a desired goal.  If possible, sit up straight or lean slightly forward. Try not to slouch.  Hold the incentive spirometer in an upright position. INSTRUCTIONS FOR USE   Sit on the edge of your bed if possible, or sit up as far as you can in bed or on a chair.  Hold the incentive spirometer in an upright position.  Breathe out normally.  Place the mouthpiece in your mouth and seal your lips tightly around it.  Breathe in slowly and as deeply as possible, raising the piston or the ball toward the top of the column.  Hold your breath for 3-5 seconds or for as long as possible. Allow the piston or ball to fall to the bottom of the column.  Remove the mouthpiece from your mouth and breathe out normally.  Rest for a few seconds and repeat Steps 1 through 7 at least 10 times every 1-2 hours when you are awake. Take your time and take a few normal breaths between deep breaths.  The spirometer may include an indicator to show your best effort. Use the indicator as a goal to work toward during each repetition.  After each set of 10 deep breaths, practice coughing to be sure your lungs are clear. If you have an incision (the cut made at the time of surgery), support your incision when coughing by placing a pillow or rolled up towels firmly against it. Once you are able to get out of bed, walk around indoors and cough well. You may stop using the incentive spirometer when instructed by your caregiver.  RISKS AND COMPLICATIONS  Take your time so you do not get dizzy or light-headed.  If you are in pain, you may need to take or ask for pain medication before doing incentive spirometry. It is  harder to take a deep breath if you  are having pain. AFTER USE  Rest and breathe slowly and easily.  It can be helpful to keep track of a log of your progress. Your caregiver can provide you with a simple table to help with this. If you are using the spirometer at home, follow these instructions: [place] IF:   You are having difficultly using the spirometer.  You have trouble using the spirometer as often as instructed.  Your pain medication is not giving enough relief while using the spirometer.  You develop fever of 100.5 F (38.1 C) or higher. SEEK IMMEDIATE MEDICAL CARE IF:   You cough up bloody sputum that had not been present before.  You develop fever of 102 F (38.9 C) or greater.  You develop worsening pain at or near the incision site. MAKE SURE YOU:   Understand these instructions.  Will watch your condition.  Will get help right away if you are not doing well or get worse. Document Released: 02/12/2007 Document Revised: 12/25/2011 Document Reviewed: 04/15/2007 [employer] Patient Information 2014 [employer], [place].   ________________________________________________________________________    CLEAR LIQUID DIET   Foods Allowed                                                                     Foods Excluded  Coffee and tea, regular and decaf                             liquids that you cannot  Plain Jell-O in any flavor                                             see through such as: Fruit ices (not with fruit pulp)                                     milk, soups, orange juice  Iced Popsicles                                                 All solid food Carbonated beverages, regular and diet                                    Cranberry, grape and apple juices Sports drinks like Gatorade Lightly seasoned clear broth or consume(fat free) Sugar, honey syrup   _____________________________________________________________________   WHAT IS A BLOOD TRANSFUSION? Blood Transfusion  Information  A transfusion is the replacement of blood or some of its parts. Blood is made up of multiple cells which provide different functions.  Red blood cells carry oxygen and are used for blood loss replacement.  White blood cells fight against infection.  Platelets control bleeding.  Plasma helps clot blood.  Other blood products are available for specialized  needs, such as hemophilia or other clotting disorders. BEFORE THE TRANSFUSION  Who gives blood for transfusions?   Healthy volunteers who are fully evaluated to make sure their blood is safe. This is blood bank blood. Transfusion therapy is the safest it has ever been in the practice of medicine. Before blood is taken from a donor, a complete history is taken to make sure that person has no history of diseases nor engages in risky social behavior (examples are intravenous drug use or sexual activity with multiple partners). The donor's travel history is screened to minimize risk of transmitting infections, such as malaria. The donated blood is tested for signs of infectious diseases, such as HIV and hepatitis. The blood is then tested to be sure it is compatible with you in order to minimize the chance of a transfusion reaction. If you or a relative donates blood, this is often done in anticipation of surgery and is not appropriate for emergency situations. It takes many days to process the donated blood. RISKS AND COMPLICATIONS Although transfusion therapy is very safe and saves many lives, the main dangers of transfusion include:   Getting an infectious disease.  Developing a transfusion reaction. This is an allergic reaction to something in the blood you were given. Every precaution is taken to prevent this. The decision to have a blood transfusion has been considered carefully by your caregiver before blood is given. Blood is not given unless the benefits outweigh the risks. AFTER THE TRANSFUSION  Right after receiving a  blood transfusion, you will usually feel much better and more energetic. This is especially true if your red blood cells have gotten low (anemic). The transfusion raises the level of the red blood cells which carry oxygen, and this usually causes an energy increase.  The nurse administering the transfusion will monitor you carefully for complications. HOME CARE INSTRUCTIONS  No special instructions are needed after a transfusion. You may find your energy is better. Speak with your caregiver about any limitations on activity for underlying diseases you may have. SEEK MEDICAL CARE IF:   Your condition is not improving after your transfusion.  You develop redness or irritation at the intravenous (IV) site. SEEK IMMEDIATE MEDICAL CARE IF:  Any of the following symptoms occur over the next 12 hours:  Shaking chills.  You have a temperature by mouth above 102 F (38.9 C), not controlled by medicine.  Chest, back, or muscle pain.  People around you feel you are not acting correctly or are confused.  Shortness of breath or difficulty breathing.  Dizziness and fainting.  You get a rash or develop hives.  You have a decrease in urine output.  Your urine turns a dark color or changes to pink, red, or brown. Any of the following symptoms occur over the next 10 days:  You have a temperature by mouth above 102 F (38.9 C), not controlled by medicine.  Shortness of breath.  Weakness after normal activity.  The white part of the eye turns yellow (jaundice).  You have a decrease in the amount of urine or are urinating less often.  Your urine turns a dark color or changes to pink, red, or brown. Document Released: 09/29/2000 Document Revised: 12/25/2011 Document Reviewed: 05/18/2008 [facility] Patient Information 2014 [place], [place].  _______________________________________________________________________

## 2015-04-13 ENCOUNTER — Encounter (HOSPITAL_COMMUNITY): Payer: Self-pay

## 2015-04-13 ENCOUNTER — Encounter (HOSPITAL_COMMUNITY)
Admission: RE | Admit: 2015-04-13 | Discharge: 2015-04-13 | Disposition: A | Discharge status: Home / Self Care | Payer: Commercial Managed Care - HMO | Source: Ambulatory Visit | Attending: Obstetrics & Gynecology | Admitting: Obstetrics & Gynecology

## 2015-04-13 DIAGNOSIS — C541 Malignant neoplasm of endometrium: Secondary | ICD-10-CM | POA: Diagnosis not present

## 2015-04-13 HISTORY — DX: Malignant neoplasm of endometrium: C54.1

## 2015-04-13 HISTORY — DX: Age-related osteoporosis without current pathological fracture: M81.0

## 2015-04-13 HISTORY — DX: Personal history of other medical treatment: Z92.89

## 2015-04-13 LAB — COMPREHENSIVE METABOLIC PANEL
ALBUMIN: 4.1 g/dL (ref 3.5–5.0)
ALK PHOS: 57 U/L (ref 38–126)
ALT: 20 U/L (ref 14–54)
ANION GAP: 8 (ref 5–15)
AST: 34 U/L (ref 15–41)
BUN: 10 mg/dL (ref 6–20)
CALCIUM: 9.5 mg/dL (ref 8.9–10.3)
CO2: 27 mmol/L (ref 22–32)
Chloride: 103 mmol/L (ref 101–111)
Creatinine, Ser: 0.84 mg/dL (ref 0.44–1.00)
GFR calc non Af Amer: >60 mL/min (ref >60)
GLUCOSE: 119 mg/dL — AB (ref 65–99)
POTASSIUM: 4.6 mmol/L (ref 3.5–5.1)
SODIUM: 138 mmol/L (ref 135–145)
Total Bilirubin: 0.6 mg/dL (ref 0.3–1.2)
Total Protein: 8.2 g/dL — ABNORMAL HIGH (ref 6.5–8.1)

## 2015-04-13 LAB — URINALYSIS, ROUTINE W REFLEX MICROSCOPIC
Bilirubin Urine: NEGATIVE
Glucose, UA: NEGATIVE mg/dL
Hgb urine dipstick: NEGATIVE
Ketones, ur: NEGATIVE mg/dL
Nitrite: NEGATIVE
PROTEIN: NEGATIVE mg/dL
Specific Gravity, Urine: 1.005 (ref 1.005–1.030)
Urobilinogen, UA: 0.2 mg/dL (ref 0.0–1.0)
pH: 5 (ref 5.0–8.0)

## 2015-04-13 LAB — CBC WITH DIFFERENTIAL/PLATELET
BASOS ABS: 0 10*3/uL (ref 0.0–0.1)
Basophils Relative: 0 % (ref 0–1)
EOS ABS: 0.1 10*3/uL (ref 0.0–0.7)
EOS PCT: 2 % (ref 0–5)
HCT: 40.6 % (ref 36.0–46.0)
Hemoglobin: 13.1 g/dL (ref 12.0–15.0)
Lymphocytes Relative: 38 % (ref 12–46)
Lymphs Abs: 1.6 10*3/uL (ref 0.7–4.0)
MCH: 29.9 pg (ref 26.0–34.0)
MCHC: 32.3 g/dL (ref 30.0–36.0)
MCV: 92.7 fL (ref 78.0–100.0)
Monocytes Absolute: 0.3 10*3/uL (ref 0.1–1.0)
Monocytes Relative: 8 % (ref 3–12)
Neutro Abs: 2.1 10*3/uL (ref 1.7–7.7)
Neutrophils Relative %: 52 % (ref 43–77)
PLATELETS: 192 10*3/uL (ref 150–400)
RBC: 4.38 MIL/uL (ref 3.87–5.11)
RDW: 12.4 % (ref 11.5–15.5)
WBC: 4.2 10*3/uL (ref 4.0–10.5)

## 2015-04-13 LAB — URINE MICROSCOPIC-ADD ON

## 2015-04-14 ENCOUNTER — Ambulatory Visit (HOSPITAL_COMMUNITY)
Admission: RE | Admit: 2015-04-14 | Discharge: 2015-04-14 | Disposition: A | Discharge status: Home / Self Care | Payer: Commercial Managed Care - HMO | Source: Ambulatory Visit | Attending: Gynecologic Oncology | Admitting: Gynecologic Oncology

## 2015-04-14 ENCOUNTER — Encounter (HOSPITAL_COMMUNITY): Payer: Self-pay

## 2015-04-14 DIAGNOSIS — C541 Malignant neoplasm of endometrium: Secondary | ICD-10-CM | POA: Insufficient documentation

## 2015-04-14 MED ORDER — IOHEXOL 300 MG/ML  SOLN
100.0000 mL | Freq: Once | INTRAMUSCULAR | Status: AC | PRN
  Administered 2015-04-14: 100 mL via INTRAVENOUS

## 2015-04-16 ENCOUNTER — Telehealth: Payer: Self-pay | Admitting: Gynecologic Oncology

## 2015-04-16 DIAGNOSIS — C541 Malignant neoplasm of endometrium: Secondary | ICD-10-CM

## 2015-04-16 HISTORY — DX: Malignant neoplasm of endometrium: C54.1

## 2015-04-16 NOTE — Telephone Encounter (Signed)
Patient informed of CT scan results.  No concerns voiced.  Advised to call for any needs. 

## 2015-04-16 NOTE — Telephone Encounter (Signed)
Message left asking the patient to call the office to discuss CT scan results.

## 2015-04-16 NOTE — Telephone Encounter (Signed)
-----   Message from Everitt Amber, MD sent at 04/16/2015  9:58 AM EDT ----- Can we please let Ms Sol know that her CT scan did not show any obvious disease (cancer) outside of the uterus. No change to current plan. Terrence Dupont

## 2015-04-20 ENCOUNTER — Ambulatory Visit (HOSPITAL_COMMUNITY)
Admission: RE | Admit: 2015-04-20 | Discharge: 2015-04-21 | Disposition: A | Discharge status: Home / Self Care | Payer: Commercial Managed Care - HMO | Source: Ambulatory Visit | Attending: Obstetrics & Gynecology | Admitting: Obstetrics & Gynecology

## 2015-04-20 ENCOUNTER — Encounter (HOSPITAL_COMMUNITY)
Admission: RE | Disposition: A | Discharge status: Home / Self Care | Payer: Self-pay | Source: Ambulatory Visit | Attending: Obstetrics & Gynecology

## 2015-04-20 ENCOUNTER — Encounter (HOSPITAL_COMMUNITY): Payer: Self-pay | Admitting: *Deleted

## 2015-04-20 ENCOUNTER — Inpatient Hospital Stay (HOSPITAL_COMMUNITY): Payer: Commercial Managed Care - HMO | Admitting: Registered Nurse

## 2015-04-20 DIAGNOSIS — C541 Malignant neoplasm of endometrium: Principal | ICD-10-CM | POA: Diagnosis present

## 2015-04-20 DIAGNOSIS — R000 Tachycardia, unspecified: Secondary | ICD-10-CM | POA: Insufficient documentation

## 2015-04-20 DIAGNOSIS — K739 Chronic hepatitis, unspecified: Secondary | ICD-10-CM | POA: Diagnosis not present

## 2015-04-20 DIAGNOSIS — C55 Malignant neoplasm of uterus, part unspecified: Secondary | ICD-10-CM | POA: Diagnosis not present

## 2015-04-20 DIAGNOSIS — D649 Anemia, unspecified: Secondary | ICD-10-CM | POA: Insufficient documentation

## 2015-04-20 DIAGNOSIS — Z85048 Personal history of other malignant neoplasm of rectum, rectosigmoid junction, and anus: Secondary | ICD-10-CM | POA: Diagnosis not present

## 2015-04-20 DIAGNOSIS — M899 Disorder of bone, unspecified: Secondary | ICD-10-CM | POA: Insufficient documentation

## 2015-04-20 DIAGNOSIS — I10 Essential (primary) hypertension: Secondary | ICD-10-CM | POA: Insufficient documentation

## 2015-04-20 DIAGNOSIS — E785 Hyperlipidemia, unspecified: Secondary | ICD-10-CM | POA: Diagnosis not present

## 2015-04-20 DIAGNOSIS — F419 Anxiety disorder, unspecified: Secondary | ICD-10-CM | POA: Insufficient documentation

## 2015-04-20 HISTORY — PX: ROBOTIC ASSISTED TOTAL HYSTERECTOMY WITH BILATERAL SALPINGO OOPHERECTOMY: SHX6086

## 2015-04-20 LAB — ABO/RH: ABO/RH(D): A NEG

## 2015-04-20 LAB — TYPE AND SCREEN
ABO/RH(D): A NEG
Antibody Screen: NEGATIVE

## 2015-04-20 SURGERY — ROBOTIC ASSISTED TOTAL HYSTERECTOMY WITH BILATERAL SALPINGO OOPHORECTOMY
Anesthesia: General | Laterality: Bilateral

## 2015-04-20 MED ORDER — ROCURONIUM BROMIDE 100 MG/10ML IV SOLN
INTRAVENOUS | Status: DC | PRN
  Administered 2015-04-20: 50 mg via INTRAVENOUS
  Administered 2015-04-20 (×3): 10 mg via INTRAVENOUS
  Administered 2015-04-20: 5 mg via INTRAVENOUS

## 2015-04-20 MED ORDER — ROCURONIUM BROMIDE 100 MG/10ML IV SOLN
INTRAVENOUS | Status: AC
Start: 2015-04-20 — End: 2015-04-20
  Filled 2015-04-20: qty 1

## 2015-04-20 MED ORDER — KCL IN DEXTROSE-NACL 20-5-0.45 MEQ/L-%-% IV SOLN
INTRAVENOUS | Status: DC
  Administered 2015-04-20: 22:00:00 via INTRAVENOUS
  Filled 2015-04-20 (×2): qty 1000

## 2015-04-20 MED ORDER — CLORAZEPATE DIPOTASSIUM 3.75 MG PO TABS
7.5000 mg | ORAL_TABLET | Freq: Two times a day (BID) | ORAL | Status: DC | PRN
  Administered 2015-04-21: 7.5 mg via ORAL
  Filled 2015-04-20: qty 2

## 2015-04-20 MED ORDER — HEPARIN SODIUM (PORCINE) 5000 UNIT/ML IJ SOLN
5000.0000 [IU] | INTRAMUSCULAR | Status: AC
  Administered 2015-04-20: 5000 [IU] via SUBCUTANEOUS
  Filled 2015-04-20: qty 1

## 2015-04-20 MED ORDER — ONDANSETRON HCL 4 MG PO TABS: 4.0000 mg | ORAL_TABLET | Freq: Four times a day (QID) | ORAL | Status: DC | PRN

## 2015-04-20 MED ORDER — LABETALOL HCL 5 MG/ML IV SOLN
INTRAVENOUS | Status: AC
  Filled 2015-04-20: qty 4

## 2015-04-20 MED ORDER — IRBESARTAN-HYDROCHLOROTHIAZIDE 300-12.5 MG PO TABS: 1.0000 | ORAL_TABLET | Freq: Every day | ORAL | Status: DC

## 2015-04-20 MED ORDER — PROPOFOL 10 MG/ML IV BOLUS
INTRAVENOUS | Status: DC | PRN
  Administered 2015-04-20: 150 mg via INTRAVENOUS

## 2015-04-20 MED ORDER — FENTANYL CITRATE (PF) 250 MCG/5ML IJ SOLN
INTRAMUSCULAR | Status: DC | PRN
  Administered 2015-04-20 (×2): 50 ug via INTRAVENOUS
  Administered 2015-04-20: 100 ug via INTRAVENOUS
  Administered 2015-04-20: 50 ug via INTRAVENOUS

## 2015-04-20 MED ORDER — ONDANSETRON HCL 4 MG/2ML IJ SOLN
INTRAMUSCULAR | Status: AC
Start: 2015-04-20 — End: 2015-04-20
  Filled 2015-04-20: qty 2

## 2015-04-20 MED ORDER — CEFAZOLIN SODIUM-DEXTROSE 2-3 GM-% IV SOLR
2.0000 g | INTRAVENOUS | Status: AC
  Administered 2015-04-20: 2 g via INTRAVENOUS

## 2015-04-20 MED ORDER — GLYCOPYRROLATE 0.2 MG/ML IJ SOLN
INTRAMUSCULAR | Status: DC | PRN
  Administered 2015-04-20: 0.6 mg via INTRAVENOUS

## 2015-04-20 MED ORDER — GLYCOPYRROLATE 0.2 MG/ML IJ SOLN
INTRAMUSCULAR | Status: AC
  Filled 2015-04-20: qty 3

## 2015-04-20 MED ORDER — LABETALOL HCL 5 MG/ML IV SOLN
INTRAVENOUS | Status: DC | PRN
  Administered 2015-04-20 (×4): 5 mg via INTRAVENOUS

## 2015-04-20 MED ORDER — LACTATED RINGERS IV SOLN: INTRAVENOUS | Status: DC

## 2015-04-20 MED ORDER — HYDROMORPHONE HCL 1 MG/ML IJ SOLN
0.2500 mg | INTRAMUSCULAR | Status: DC | PRN
  Administered 2015-04-20 (×2): 0.5 mg via INTRAVENOUS

## 2015-04-20 MED ORDER — IBUPROFEN 800 MG PO TABS: 800.0000 mg | ORAL_TABLET | Freq: Three times a day (TID) | ORAL | Status: DC | PRN

## 2015-04-20 MED ORDER — OXYCODONE-ACETAMINOPHEN 5-325 MG PO TABS: 1.0000 | ORAL_TABLET | ORAL | Status: DC | PRN

## 2015-04-20 MED ORDER — HYDROMORPHONE HCL 1 MG/ML IJ SOLN
INTRAMUSCULAR | Status: AC
  Filled 2015-04-20: qty 1

## 2015-04-20 MED ORDER — IRBESARTAN 300 MG PO TABS
300.0000 mg | ORAL_TABLET | Freq: Every day | ORAL | Status: DC
  Administered 2015-04-21: 300 mg via ORAL
  Filled 2015-04-20: qty 1

## 2015-04-20 MED ORDER — HYDROCORTISONE NA SUCCINATE PF 100 MG IJ SOLR
50.0000 mg | Freq: Three times a day (TID) | INTRAMUSCULAR | Status: DC
  Administered 2015-04-20 – 2015-04-21 (×2): 50 mg via INTRAVENOUS
  Filled 2015-04-20 (×3): qty 1

## 2015-04-20 MED ORDER — HYDROMORPHONE HCL 1 MG/ML IJ SOLN: 0.2000 mg | INTRAMUSCULAR | Status: DC | PRN

## 2015-04-20 MED ORDER — PROPOFOL 10 MG/ML IV BOLUS
INTRAVENOUS | Status: AC
  Filled 2015-04-20: qty 20

## 2015-04-20 MED ORDER — LIDOCAINE HCL (CARDIAC) 20 MG/ML IV SOLN
INTRAVENOUS | Status: DC | PRN
  Administered 2015-04-20: 80 mg via INTRAVENOUS

## 2015-04-20 MED ORDER — FENTANYL CITRATE (PF) 250 MCG/5ML IJ SOLN
INTRAMUSCULAR | Status: AC
  Filled 2015-04-20: qty 5

## 2015-04-20 MED ORDER — DEXAMETHASONE SODIUM PHOSPHATE 10 MG/ML IJ SOLN
INTRAMUSCULAR | Status: DC | PRN
  Administered 2015-04-20: 10 mg via INTRAVENOUS

## 2015-04-20 MED ORDER — RINGERS IRRIGATION IR SOLN
Status: DC | PRN
  Administered 2015-04-20: 1

## 2015-04-20 MED ORDER — HYDROMORPHONE HCL 1 MG/ML IJ SOLN
INTRAMUSCULAR | Status: DC | PRN
  Administered 2015-04-20: .2 mg via INTRAVENOUS

## 2015-04-20 MED ORDER — ARTIFICIAL TEARS OP OINT
TOPICAL_OINTMENT | OPHTHALMIC | Status: AC
Start: 2015-04-20 — End: 2015-04-20
  Filled 2015-04-20: qty 3.5

## 2015-04-20 MED ORDER — STERILE WATER FOR IRRIGATION IR SOLN
Status: DC | PRN
  Administered 2015-04-20: 1500 mL

## 2015-04-20 MED ORDER — DEXAMETHASONE SODIUM PHOSPHATE 10 MG/ML IJ SOLN
INTRAMUSCULAR | Status: AC
Start: 2015-04-20 — End: 2015-04-20
  Filled 2015-04-20: qty 1

## 2015-04-20 MED ORDER — ONDANSETRON HCL 4 MG/2ML IJ SOLN: 4.0000 mg | Freq: Four times a day (QID) | INTRAMUSCULAR | Status: DC | PRN

## 2015-04-20 MED ORDER — NEOSTIGMINE METHYLSULFATE 10 MG/10ML IV SOLN
INTRAVENOUS | Status: DC | PRN
  Administered 2015-04-20: 4 mg via INTRAVENOUS

## 2015-04-20 MED ORDER — ATENOLOL 50 MG PO TABS
50.0000 mg | ORAL_TABLET | Freq: Every day | ORAL | Status: DC
  Administered 2015-04-21: 50 mg via ORAL
  Filled 2015-04-20: qty 1

## 2015-04-20 MED ORDER — SUCCINYLCHOLINE CHLORIDE 20 MG/ML IJ SOLN
INTRAMUSCULAR | Status: DC | PRN
  Administered 2015-04-20: 100 mg via INTRAVENOUS

## 2015-04-20 MED ORDER — HYDROCHLOROTHIAZIDE 12.5 MG PO CAPS
12.5000 mg | ORAL_CAPSULE | Freq: Every day | ORAL | Status: DC
  Administered 2015-04-21: 12.5 mg via ORAL
  Filled 2015-04-20: qty 1

## 2015-04-20 MED ORDER — LACTATED RINGERS IV SOLN
INTRAVENOUS | Status: DC
  Administered 2015-04-20: 18:00:00 via INTRAVENOUS
  Administered 2015-04-20: 1000 mL via INTRAVENOUS

## 2015-04-20 MED ORDER — ONDANSETRON HCL 4 MG/2ML IJ SOLN
INTRAMUSCULAR | Status: DC | PRN
  Administered 2015-04-20: 4 mg via INTRAVENOUS

## 2015-04-20 MED ORDER — NEOSTIGMINE METHYLSULFATE 10 MG/10ML IV SOLN
INTRAVENOUS | Status: AC
  Filled 2015-04-20: qty 1

## 2015-04-20 MED ORDER — ENOXAPARIN SODIUM 40 MG/0.4ML ~~LOC~~ SOLN
40.0000 mg | SUBCUTANEOUS | Status: DC
  Administered 2015-04-21: 40 mg via SUBCUTANEOUS
  Filled 2015-04-20 (×2): qty 0.4

## 2015-04-20 MED ORDER — CEFAZOLIN SODIUM-DEXTROSE 2-3 GM-% IV SOLR
INTRAVENOUS | Status: AC
  Filled 2015-04-20: qty 50

## 2015-04-20 MED ORDER — HYDROMORPHONE HCL 2 MG/ML IJ SOLN
INTRAMUSCULAR | Status: AC
Start: 2015-04-20 — End: 2015-04-20
  Filled 2015-04-20: qty 1

## 2015-04-20 SURGICAL SUPPLY — 62 items
APL ESCP 34 STRL LF DISP (HEMOSTASIS) ×1
APL SKNCLS STERI-STRIP NONHPOA (GAUZE/BANDAGES/DRESSINGS) ×1
APPLICATOR SURGIFLO ENDO (HEMOSTASIS) ×3 IMPLANT
BAG SPEC RTRVL LRG 6X4 10 (ENDOMECHANICALS) ×1
BENZOIN TINCTURE PRP APPL 2/3 (GAUZE/BANDAGES/DRESSINGS) ×3 IMPLANT
CABLE HIGH FREQUENCY MONO STRZ (ELECTRODE) ×3 IMPLANT
CHLORAPREP W/TINT 26ML (MISCELLANEOUS) ×3 IMPLANT
CLOSURE WOUND 1/2 X4 (GAUZE/BANDAGES/DRESSINGS) ×1
CORDS BIPOLAR (ELECTRODE) ×3 IMPLANT
COVER SURGICAL LIGHT HANDLE (MISCELLANEOUS) ×3 IMPLANT
COVER TIP SHEARS 8 DVNC (MISCELLANEOUS) ×1 IMPLANT
COVER TIP SHEARS 8MM DA VINCI (MISCELLANEOUS) ×2
DRAPE SHEET LG 3/4 BI-LAMINATE (DRAPES) ×6 IMPLANT
DRAPE SURG IRRIG POUCH 19X23 (DRAPES) ×3 IMPLANT
DRAPE TABLE BACK 44X90 PK DISP (DRAPES) ×6 IMPLANT
DRAPE WARM FLUID 44X44 (DRAPE) ×3 IMPLANT
DRSG TEGADERM 2-3/8X2-3/4 SM (GAUZE/BANDAGES/DRESSINGS) ×9 IMPLANT
DRSG TEGADERM 4X4.75 (GAUZE/BANDAGES/DRESSINGS) ×3 IMPLANT
DRSG TEGADERM 6X8 (GAUZE/BANDAGES/DRESSINGS) ×6 IMPLANT
ELECT REM PT RETURN 9FT ADLT (ELECTROSURGICAL) ×3
ELECTRODE REM PT RTRN 9FT ADLT (ELECTROSURGICAL) ×1 IMPLANT
FLOSEAL 10ML (HEMOSTASIS) ×3 IMPLANT
GAUZE SPONGE 2X2 8PLY STRL LF (GAUZE/BANDAGES/DRESSINGS) ×2 IMPLANT
GLOVE BIO SURGEON STRL SZ 6.5 (GLOVE) ×4 IMPLANT
GLOVE BIO SURGEON STRL SZ7.5 (GLOVE) ×9 IMPLANT
GLOVE BIO SURGEONS STRL SZ 6.5 (GLOVE) ×2
GLOVE INDICATOR 8.0 STRL GRN (GLOVE) ×6 IMPLANT
GOWN STRL NON-REIN LRG LVL3 (GOWN DISPOSABLE) ×3 IMPLANT
GOWN STRL REUS W/TWL XL LVL3 (GOWN DISPOSABLE) ×6 IMPLANT
HOLDER FOLEY CATH W/STRAP (MISCELLANEOUS) ×3 IMPLANT
KIT ACCESSORY DA VINCI DISP (KITS) ×2
KIT ACCESSORY DVNC DISP (KITS) ×1 IMPLANT
KIT BASIN OR (CUSTOM PROCEDURE TRAY) ×3 IMPLANT
LIQUID BAND (GAUZE/BANDAGES/DRESSINGS) ×3 IMPLANT
MANIPULATOR UTERINE 4.5 ZUMI (MISCELLANEOUS) IMPLANT
MARKER SKIN DUAL TIP RULER LAB (MISCELLANEOUS) ×3 IMPLANT
OCCLUDER COLPOPNEUMO (BALLOONS) ×3 IMPLANT
POUCH SPECIMEN RETRIEVAL 10MM (ENDOMECHANICALS) ×3 IMPLANT
SCISSORS LAP 5X35 DISP (ENDOMECHANICALS) ×3 IMPLANT
SET TUBE IRRIG SUCTION NO TIP (IRRIGATION / IRRIGATOR) ×3 IMPLANT
SHEET LAVH (DRAPES) ×3 IMPLANT
SOLUTION ELECTROLUBE (MISCELLANEOUS) ×3 IMPLANT
SPONGE GAUZE 2X2 STER 10/PKG (GAUZE/BANDAGES/DRESSINGS) ×4
SPONGE LAP 18X18 X RAY DECT (DISPOSABLE) IMPLANT
STRIP CLOSURE SKIN 1/2X4 (GAUZE/BANDAGES/DRESSINGS) ×2 IMPLANT
SUT VIC AB 0 CT1 27 (SUTURE) ×6
SUT VIC AB 0 CT1 27XBRD ANTBC (SUTURE) ×2 IMPLANT
SUT VIC AB 4-0 PS2 27 (SUTURE) ×6 IMPLANT
SUT VICRYL 0 UR6 27IN ABS (SUTURE) IMPLANT
SUT VLOC 180 0 9IN  GS21 (SUTURE)
SUT VLOC 180 0 9IN GS21 (SUTURE) IMPLANT
SYR 50ML LL SCALE MARK (SYRINGE) ×3 IMPLANT
SYR BULB IRRIGATION 50ML (SYRINGE) IMPLANT
TOWEL OR 17X26 10 PK STRL BLUE (TOWEL DISPOSABLE) ×6 IMPLANT
TOWEL OR NON WOVEN STRL DISP B (DISPOSABLE) ×3 IMPLANT
TRAP SPECIMEN MUCOUS 40CC (MISCELLANEOUS) IMPLANT
TRAY FOLEY W/METER SILVER 14FR (SET/KITS/TRAYS/PACK) ×3 IMPLANT
TRAY LAPAROSCOPIC (CUSTOM PROCEDURE TRAY) ×3 IMPLANT
TROCAR 12M 150ML BLUNT (TROCAR) ×3 IMPLANT
TROCAR BLADELESS OPT 5 75 (ENDOMECHANICALS) ×3 IMPLANT
TROCAR XCEL 12X100 BLDLESS (ENDOMECHANICALS) ×3 IMPLANT
WATER STERILE IRR 1500ML POUR (IV SOLUTION) ×6 IMPLANT

## 2015-04-20 NOTE — Anesthesia Postprocedure Evaluation (Signed)
  Anesthesia Post-op Note  Patient: Sharon Holt  Procedure(s) Performed: Procedure(s) (LRB): ROBOTIC ASSISTED TOTAL HYSTERECTOMY WITH BILATERAL SALPINGO OOPHORECTOMY AND PELVIC AND PERIAORTIC LYMPHADECTOMY (Bilateral)  Patient Location: PACU  Anesthesia Type: General  Level of Consciousness: awake and alert   Airway and Oxygen Therapy: Patient Spontanous Breathing  Post-op Pain: mild  Post-op Assessment: Post-op Vital signs reviewed, Patient's Cardiovascular Status Stable, Respiratory Function Stable, Patent Airway and No signs of Nausea or vomiting  Last Vitals:  Filed Vitals:   04/20/15 1945  BP: 164/123  Pulse: 71  Temp:   Resp: 10    Post-op Vital Signs: stable   Complications: No apparent anesthesia complications

## 2015-04-20 NOTE — Interval H&P Note (Signed)
History and Physical Interval Note:  04/20/2015 3:54 PM  Sharon Holt  has presented today for surgery, with the diagnosis of endometrial cancer  The various methods of treatment have been discussed with the patient and family. After consideration of risks, benefits and other options for treatment, the patient has consented to  Procedure(s): ROBOTIC ASSISTED TOTAL HYSTERECTOMY WITH BILATERAL SALPINGO OOPHORECTOMY AND PELVIC AND PERIAORTIC LYMPHADECTOMY (Bilateral) as a surgical intervention .  The patient's history has been reviewed, patient examined, no change in status, stable for surgery.  I have reviewed the patient's chart and labs.  Questions were answered to the patient's satisfaction.     [place], [facility]

## 2015-04-20 NOTE — Transfer of Care (Signed)
Immediate Anesthesia Transfer of Care Note  Patient: Sharon Holt  Procedure(s) Performed: Procedure(s): ROBOTIC ASSISTED TOTAL HYSTERECTOMY WITH BILATERAL SALPINGO OOPHORECTOMY AND PELVIC AND PERIAORTIC LYMPHADECTOMY (Bilateral)  Patient Location: PACU  Anesthesia Type:General  Level of Consciousness: awake, alert , oriented and patient cooperative  Airway & Oxygen Therapy: Patient Spontanous Breathing and Patient connected to face mask oxygen  Post-op Assessment: Report given to RN and Post -op Vital signs reviewed and stable  Post vital signs: Reviewed and stable  Last Vitals:  Filed Vitals:   04/20/15 1322  BP:   Pulse:   Temp: 36.9 C  Resp:     Complications: No apparent anesthesia complications

## 2015-04-20 NOTE — H&P (View-Only) (Signed)
Consult Note: Gyn-Onc  Consult was requested by Dr. Garwin Brothers  for the evaluation of Sharon Holt 74 y.o. female  CC:  Chief Complaint  Patient presents with  . endometrial cancer    New consult    Assessment/Plan:  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   A detailed discussion was held with the patient and her family with regard to to her endometrial cancer diagnosis. We discussed the standard management options for uterine cancer which includes surgery followed possibly by adjuvant therapy depending on the results of surgery. The options for surgical management include a hysterectomy and removal of the tubes and ovaries possibly with removal of pelvic and para-aortic lymph nodes. A minimally invasive approach including a robotic hysterectomy or laparoscopic hysterectomy have benefits including shorter hospital stay, recovery time and better wound healing. The alternative approach is an open hysterectomy. The patient has been counseled about these surgical options and the risks of surgery in general including infection, bleeding, damage to surrounding structures (including bowel, bladder, ureters, nerves or vessels), and the postoperative risks of PE/ DVT, and lymphedema. I extensively reviewed the additional risks of robotic hysterectomy including possible need for conversion to open laparotomy.  I discussed positioning during surgery of trendelenberg and risks of minor facial swelling and care we take in preoperative positioning.  After counseling and consideration of her options, she desires to proceed with robotic hysterectomy, BSO, pelvic and para-aortic lymphadenectomy.   She will be seen by anesthesia for preoperative clearance and discussion of postoperative pain management.  She was given the opportunity to ask questions, which were answered to her satisfaction, and she is agreement with the above mentioned plan of care.  We will schedule  her for a preoperative CA 125 and CT imaging of the chest, abdomen and pelvis because she has a high grade cancer that puts her at increased risk for metastatic.  HPI: Sharon Holt is a 74 year old [id number] who is seen in consultation at the request of Dr Garwin Brothers for grade 3 serous endometrial cancer.  She  is a 2 month history of vaginal spotting. She saw Dr. cousins for her scheduled annual visits in May 2016. And had a Pap performed which was positive for AGUS. She then underwent ultrasound evaluation of the and he treated him which revealed a thickened endometrial stripe at 8.8 mm. The uterus itself measured 8 x 5 x 6 cm. An endometrial biopsy was performed on 03/12/2015 which revealed high-grade adenocarcinoma with papillary features architecturally however the immunostains were not typical for serous adenocarcinoma.  Interval History: Her nephew committed suicide this morning.  Current Meds:  Outpatient Encounter Prescriptions as of 04/08/2015  Medication Sig  . atenolol (TENORMIN) 50 MG tablet TAKE 1 TABLET DAILY  . Cholecalciferol (VITAMIN D3) 2000 UNITS capsule Take 4,000 Units by mouth daily.   . clorazepate (TRANXENE) 7.5 MG tablet Take 1 tablet (7.5 mg total) by mouth 2 (two) times daily as needed for anxiety.  . fish oil-omega-3 fatty acids 1000 MG capsule Take 1 g by mouth daily.    . Garlic Oil 9476 MG CAPS Take 1 capsule by mouth daily.    . irbesartan-hydrochlorothiazide (AVALIDE) 300-12.5 MG per tablet take 1 tablet by mouth once daily  . potassium chloride SA (K-DUR,KLOR-CON) 20 MEQ tablet Take 1 tablet (20 mEq total) by mouth daily.  . predniSONE (DELTASONE) 5 MG tablet Take 0.5 tablets (2.5 mg total) by mouth daily.  Marland Kitchen  raloxifene (EVISTA) 60 MG tablet Take 60 mg by mouth daily.    . Multiple Vitamins-Minerals (CENTRUM SILVER PO) Take 1 capsule by mouth daily.    . [DISCONTINUED] calcium carbonate (TUMS - DOSED IN MG ELEMENTAL CALCIUM) 500 MG chewable tablet Chew 1  tablet by mouth daily.    . [DISCONTINUED] furosemide (LASIX) 20 MG tablet TAKE 1 TABLET DAILY prn swelling  . [DISCONTINUED] lisinopril (PRINIVIL,ZESTRIL) 20 MG tablet    No facility-administered encounter medications on file as of 04/08/2015.    Allergy: No Known Allergies  Social Hx:   History   Social History  . Marital Status: Married    Spouse Name: Cheryel Kyte  . Number of Children: N/A  . Years of Education: N/A   Occupational History  . Retired     Company secretary   Social History Main Topics  . Smoking status: Never Smoker   . Smokeless tobacco: Never Used  . Alcohol Use: No  . Drug Use: No  . Sexual Activity: Not on file   Other Topics Concern  . Not on file   Social History Narrative    Past Surgical Hx:  Past Surgical History  Procedure Laterality Date  . Cholecystectomy    . Tonsillectomy    . Breast lumpectomy      right    Past Medical Hx:  Past Medical History  Diagnosis Date  . Unspecified essential hypertension   . Mitral valve disorders   . Palpitations   . Other and unspecified hyperlipidemia   . Other abnormal glucose   . Malignant neoplasm of anal canal   . Chronic hepatitis, unspecified   . Disorder of bone and cartilage, unspecified   . Anxiety state, unspecified   . Anemia, unspecified     Past Gynecological History:  SVD x 4  No LMP recorded. Patient is postmenopausal.  Family Hx:  Family History  Problem Relation Age of Onset  . Prostate cancer Brother   . Stroke Father   . Breast cancer Sister     Review of Systems:  Constitutional  Feels well,    ENT Normal appearing ears and nares bilaterally Skin/Breast  No rash, sores, jaundice, itching, dryness Cardiovascular  No chest pain, shortness of breath, or edema  Pulmonary  No cough or wheeze.  Gastro Intestinal  No nausea, vomitting, or diarrhoea. No bright red blood per rectum, no abdominal pain, change in bowel movement, or constipation.  Genito Urinary  No  frequency, urgency, dysuria,  Musculo Skeletal  No myalgia, arthralgia, joint swelling or pain  Neurologic  No weakness, numbness, change in gait,  Psychology  No depression, anxiety, insomnia.   Vitals:  Blood pressure 159/81, pulse 77, temperature 98.6 F (37 C), temperature source Oral, resp. rate 18, height 5\' 5"  (1.651 m), weight 174 lb 3.2 oz (79.017 kg), SpO2 100 %.  Physical Exam: WD in NAD Neck  Supple NROM, without any enlargements.  Lymph Node Survey No cervical supraclavicular or inguinal adenopathy Cardiovascular  Pulse normal rate, regularity and rhythm. S1 and S2 normal.  Lungs  Clear to auscultation bilateraly, without wheezes/crackles/rhonchi. Good air movement.  Skin  No rash/lesions/breakdown  Psychiatry  Alert and oriented to person, place, and time  Abdomen  Normoactive bowel sounds, abdomen soft, non-tender and overweight without evidence of hernia.  Back No CVA tenderness Genito Urinary  Vulva/vagina: Normal external female genitalia.  No lesions. No discharge or bleeding.  Bladder/urethra:  No lesions or masses, well supported bladder  Vagina: normal,  no lesions  Cervix: Normal appearing, no lesions.  Uterus: Small, mobile, no parametrial involvement or nodularity.  Adnexa: no palpable masses. Rectal  Good tone, no masses no cul de sac nodularity.  Extremities  No bilateral cyanosis, clubbing or edema.   [name], MD   04/08/2015, 1:15 PM

## 2015-04-20 NOTE — Op Note (Signed)
Preoperative Diagnosis: Uterine serous cancer  Postoperative Diagnosis: Uterine serous cancer  Procedure(s) Performed: Robotic total laparoscopic hysterectomy, Bilateral salpingo oophorectomy,  Bilateral pelvic lymph node dissection, bilateral para-aortic lymph node dissection.  Anesthesia: GET  Surgeon: Francetta Found.  Skeet Latch, M.D. PhD  Assistant Surgeon: Lahoma Crocker MD.   Specimens: Uterus cervix,ovaries tubes pelvic and para-aortic LN  Complications: None  Indication for Procedure: This is a 74 y.o.  with uterine serous  endometrial cancer.  Operative Findings:  10cm uterus with multiple serosal and intramural myomas.  4cm right adnexal mass.  No adenopathy.     Procedure: Patient was taken to the operating room and placed under general endotracheal anesthesia without any difficulty. She is placed in the dorsal lithotomy position and secured to the operative table over the chest with tape.   The patient was prepped and draped and the uterine manipulator placed within the endometrial cavity. The appropriately sized Koh ring was circumferentially around the cervix. The balloon was placed within the vagina. An OG tube was present and functional.   At an area on the left in line with the nipple approximately 2 cm below the ribs the area  a 5 mm Optiview inserted under direct visualization. The abdomen was insufflated to 15 mm of mercury and the pressure never deviated above that throughout the remainder of the procedure. Maximum Trendelenburg positioning was obtained. At approximately 22 cm proximal to the symphysis pubis an incision was made just superior to the umbilicus  as well as the location 10 cm lateral to this incision bilaterally  and 2 cm inferior to the right costal margin. 12 mm trocars  were inserted in the superior umbilicus and right upper quadrant incisions. 8 millimeter robotic ports were placed in the other 3 incisions.  This was all completed under direct visualization.  The small and large bowel were reflected as much as possible into the upper abdomen. The omentum that was adherent to the left fallopian tube was transected.  The robot was docked and instruments placed.  An incision was made lateral to the proximal right common iliac artery in the peritoneum. The ureter was identified and retracted.  The nodal tissue lateral to the aorta and vena cava were removed to the level just superior to the IMA.  Hemostasis was achieved.  Left para-aortic LN under the IMA were resected and placed in an Endocatch bag.  Flow seal was placed at the operative site to facilitate hemostasis.  The right round ligament was transected and the ureter was identified. The right infundibulopelvic ligament was cauterized and transected.  Right pelvic lymph node dissection was then initiated. The superior vesicle artery was identified and the vesicouterine space developed. The obturator nerve was identified. Nodal tissue was removed within the boundaries of the right genitofemoral nerve the right circumflex vein, the ureter, the obturator nerve  and the superior vesicle artery.Bleeding in the obturator fossa was controlled with cautery and Floseal.  The nodal tissue was placed in an Endo Catch bag.   The retroperitoneal space was entered on the right and the peritoneum incised to the level of the vesicouterine ligament anteriorly.  The right adnexa was transected to facilitate visualization The bladder flap was created with difficulty because of the anterior right myoma. . The peritoneal dissection was continued inferiorly and across the inferior most aspect of the cervix. In this manner the urethra was deflected inferiorly. The bladder flap was further developed. The uterine vessels on the right were skeletonized ligated and transected.  The left ureter was identified. The left gonadal vessels were cauterized and transected.    The left pelvic lymph node dissection was then initiated. The superior  vesicle artery was identified and the vesicouterine space developed. The obturator nerve was identified. Nodal tissue was removed within the boundaries of the left  genitofemoral nerve the left circumflex vein, the ureter and the superior vesicle artery. The nodal tissue was placed in an Endo Catch bag.  The broad ligament was skeletonized posteriorly to the level of the cervix and the peritoneum dissected free from the cervix and in this fashion the ureter was deflected inferiorly. The anterior peritoneum was further dissected and the bladder flap appropriately developed. The uterine vessels were skeletonized cauterized and transected. The balloon and the vagina was then maximally insufflated.   A colpotomy incision was made circumferentially and the uterus cervix ovaries tubes and lymph nodes were delivered from the vagina. The Koh ring was removed and the balloon was replaced.  The pelvis was copiously irrigated and drained and hemostasis was assured. The vaginal cuff was closed with a running 0. Vicryl suture ligature. The needle was removed under direct visualization. The operative site is once again visualized and hemostasis was assured. The instruments were removed from the abdomen and pelvis and the port sites irrigated. The umbilical fascia was closed with an interrupted 0 Vicryl  suture. The subcutaneous tissue of the umbilical left upper quadrant and right lower quadrant subcutaneous tissues were approximated with a single suture. Skin incisions were closed with a subcuticular suture.  The vaginal vault was cleared with a moist sponge stick.  Sponge, lap and needle counts were correct x 3.    The patient had sequential compression devices and preoperative Lovenox for VTE prophylaxis and will receive Lovenox postoperatively.          Disposition: PACU - hemodynamically stable.         Condition:stable Foley draining clear urine.

## 2015-04-20 NOTE — Anesthesia Preprocedure Evaluation (Addendum)
Anesthesia Evaluation  Patient identified by MRN, date of birth, ID band Patient awake    Reviewed: Allergy & Precautions, H&P , NPO status , Patient's Chart, lab work & pertinent test results  Airway Mallampati: II  TM Distance: >3 FB Neck ROM: full    Dental  (+) Dental Advisory Given, Edentulous Upper, Edentulous Lower   Pulmonary neg pulmonary ROS,  breath sounds clear to auscultation  Pulmonary exam normal       Cardiovascular Exercise Tolerance: Good hypertension, Pt. on home beta blockers and Pt. on medications Normal cardiovascular exam+ Valvular Problems/Murmurs MVP Rhythm:regular Rate:Normal  Palpitations. LVH   Neuro/Psych Anxiety negative neurological ROS  negative psych ROS   GI/Hepatic negative GI ROS, (+) Hepatitis -, UnspecifiedChronic hepatitis   Endo/Other  negative endocrine ROS  Renal/GU negative Renal ROS  negative genitourinary   Musculoskeletal   Abdominal   Peds  Hematology negative hematology ROS (+)   Anesthesia Other Findings   Reproductive/Obstetrics negative OB ROS                            Anesthesia Physical Anesthesia Plan  ASA: II  Anesthesia Plan: General   Post-op Pain Management:    Induction: Intravenous  Airway Management Planned: Oral ETT  Additional Equipment:   Intra-op Plan:   Post-operative Plan: Extubation in OR  Informed Consent: I have reviewed the patients History and Physical, chart, labs and discussed the procedure including the risks, benefits and alternatives for the proposed anesthesia with the patient or authorized representative who has indicated his/her understanding and acceptance.   Dental Advisory Given  Plan Discussed with: CRNA and Surgeon  Anesthesia Plan Comments:         Anesthesia Quick Evaluation

## 2015-04-20 NOTE — Anesthesia Procedure Notes (Signed)
Procedure Name: Intubation Date/Time: 04/20/2015 4:00 PM Performed by: Dione Booze Pre-anesthesia Checklist: Patient identified, Emergency Drugs available, Suction available and Patient being monitored Patient Re-evaluated:Patient Re-evaluated prior to inductionOxygen Delivery Method: Circle system utilized Preoxygenation: Pre-oxygenation with 100% oxygen Intubation Type: IV induction Ventilation: Oral airway inserted - appropriate to patient size Laryngoscope Size: Mac and 4 Grade View: Grade II Tube type: Oral Tube size: 7.5 mm Number of attempts: 1 Airway Equipment and Method: Stylet Placement Confirmation: ETT inserted through vocal cords under direct vision,  positive ETCO2 and breath sounds checked- equal and bilateral Secured at: 21 cm Tube secured with: Tape Dental Injury: Teeth and Oropharynx as per pre-operative assessment

## 2015-04-21 ENCOUNTER — Telehealth: Payer: Self-pay

## 2015-04-21 ENCOUNTER — Encounter (HOSPITAL_COMMUNITY): Payer: Self-pay | Admitting: Gynecologic Oncology

## 2015-04-21 ENCOUNTER — Telehealth: Payer: Self-pay | Admitting: *Deleted

## 2015-04-21 DIAGNOSIS — F419 Anxiety disorder, unspecified: Secondary | ICD-10-CM | POA: Diagnosis not present

## 2015-04-21 DIAGNOSIS — E785 Hyperlipidemia, unspecified: Secondary | ICD-10-CM | POA: Diagnosis not present

## 2015-04-21 DIAGNOSIS — C541 Malignant neoplasm of endometrium: Secondary | ICD-10-CM | POA: Diagnosis not present

## 2015-04-21 DIAGNOSIS — I10 Essential (primary) hypertension: Secondary | ICD-10-CM | POA: Diagnosis not present

## 2015-04-21 LAB — CBC
HEMATOCRIT: 35.4 % — AB (ref 36.0–46.0)
Hemoglobin: 11.6 g/dL — ABNORMAL LOW (ref 12.0–15.0)
MCH: 30.2 pg (ref 26.0–34.0)
MCHC: 32.8 g/dL (ref 30.0–36.0)
MCV: 92.2 fL (ref 78.0–100.0)
PLATELETS: 211 10*3/uL (ref 150–400)
RBC: 3.84 MIL/uL — ABNORMAL LOW (ref 3.87–5.11)
RDW: 12.5 % (ref 11.5–15.5)
WBC: 9 10*3/uL (ref 4.0–10.5)

## 2015-04-21 LAB — BASIC METABOLIC PANEL
Anion gap: 8 (ref 5–15)
BUN: 9 mg/dL (ref 6–20)
CHLORIDE: 104 mmol/L (ref 101–111)
CO2: 25 mmol/L (ref 22–32)
Calcium: 8.8 mg/dL — ABNORMAL LOW (ref 8.9–10.3)
Creatinine, Ser: 0.93 mg/dL (ref 0.44–1.00)
GFR calc Af Amer: >60 mL/min (ref >60)
GFR calc non Af Amer: 60 mL/min — ABNORMAL LOW (ref >60)
GLUCOSE: 188 mg/dL — AB (ref 65–99)
Potassium: 4 mmol/L (ref 3.5–5.1)
SODIUM: 137 mmol/L (ref 135–145)

## 2015-04-21 MED ORDER — TRAMADOL HCL 50 MG PO TABS: 50.0000 mg | ORAL_TABLET | Freq: Four times a day (QID) | ORAL | Status: DC | PRN

## 2015-04-21 MED ORDER — IBUPROFEN 800 MG PO TABS: 800.0000 mg | ORAL_TABLET | Freq: Three times a day (TID) | ORAL | Status: DC | PRN

## 2015-04-21 MED ORDER — DOCUSATE SODIUM 100 MG PO CAPS: 100.0000 mg | ORAL_CAPSULE | Freq: Two times a day (BID) | ORAL | Status: DC

## 2015-04-21 NOTE — Telephone Encounter (Signed)
Pt is on TCM list. Admitted for surgery: dx uterine cancer. Follow up with oncology.

## 2015-04-21 NOTE — Progress Notes (Addendum)
Patient is alert and oriented, vital signs are stable, incisions are within normal limits, patient is tolerating her diet with complaints of nausea or vomiting, discharge instructions reviewed with patient and spouse, patient instructed to follow up this coming Friday at [facility] at [place] for appointment per Joylene John NP, prescription given to patient and questions and concerns answered, will continue to monitor Neta Mends RN 04-21-2015 1:10 PM

## 2015-04-21 NOTE — Progress Notes (Signed)
1 Day Post-Op Procedure(s) (LRB): ROBOTIC ASSISTED TOTAL HYSTERECTOMY WITH BILATERAL SALPINGO OOPHORECTOMY AND PELVIC AND PERIAORTIC LYMPHADECTOMY (Bilateral)  Subjective: Patient reports feeling anxious, denies pain .    Objective: Vital signs in last 24 hours: Temp:  [97.7 F (36.5 C)-99.6 F (37.6 C)] 98.2 F (36.8 C) (07/06 0951) Pulse Rate:  [59-126] 126 (07/06 0951) Resp:  [10-18] 16 (07/06 0951) BP: (144-174)/(60-123) 150/74 mmHg (07/06 0951) SpO2:  [97 %-100 %] 100 % (07/06 0951) Weight:  [179 lb (81.194 kg)] 179 lb (81.194 kg) (07/05 1118) Last BM Date: 04/19/15  Intake/Output from previous day: 07/05 0701 - 07/06 0700 In: 1911.7 [I.V.:1911.7] Out: 800 [Urine:700; Blood:100]  Physical Examination: General: alert and cooperative Resp: clear to auscultation bilaterally Cardio: regular rate and rhythm, S1, S2 normal, no murmur, click, rub or gallop and tachycardic GI: soft, non-tender; bowel sounds normal; no masses,  no organomegaly incisions clean, dry ad intact  Labs: WBC/Hgb/Hct/Plts:  9.0/11.6/35.4/211 (07/06 0430) BUN/Cr/glu/ALT/AST/amyl/lip:  9/0.93/--/--/--/--/-- (07/06 0430)   Assessment:  74 y.o. s/p Procedure(s): ROBOTIC ASSISTED TOTAL HYSTERECTOMY WITH BILATERAL SALPINGO OOPHORECTOMY AND PELVIC AND PERIAORTIC LYMPHADECTOMY: stable Pain:  Pain is well-controlled on oral medications.  Heme:no anemia (see above) to explain tachycardia  CV: tachycardia - unexplained. Check 12 lead EKG. Regular by my exam. If sinus tachycardia, will continue plans for discharge as no hypoxia or reason to be concerned for occult PE.  FEN: hep lock IV.  Prophylaxis: pharmacologic prophylaxis (with any of the following: unfractionated SQ heparin 5000 units 2 hours prior to surgery then every 12 hours).  Plan: Advance diet Encourage ambulation Advance to PO medication Discharge home Dispo:  Discharge plan to include :d.c to home The patient is to be discharged to  home.   LOS: 1 day    [name] 04/21/2015, 10:28 AM

## 2015-04-21 NOTE — Discharge Summary (Signed)
Physician Discharge Summary  Patient ID: Sharon Holt MRN: [id number] DOB/AGE: Jul 07, 1941 74 y.o.  Admit date: 04/20/2015 Discharge date: 04/21/2015  Admission Diagnoses: <principal problem not specified>  Discharge Diagnoses:  Active Problems:   Endometrial cancer   Discharged Condition: good  Hospital Course: the patient was discharged to home on POD 1 after undergoing a robotic hysterectomy, BSO, bilateral pelvic and para-aortic dissection for serous endometrial cancer. Surgery was uncomplicated. Postop Hb was within normal range. On POD 1 she had tachycardia to 120 which was regular to palpate. EKG was ordered which revealed sinus tachycardia, and left ventricular hypertrophy (consistent with her HTN). Her EKG was essentially unchanged compared to preop with the exception of tachycardia. She was saturating at 100% on room air.  She felt anxiety was the cause of her tachycardia. She denied chest pain or SOB.  Consults: None  Significant Diagnostic Studies: labs:  CBC    Component Value Date/Time   WBC 9.0 04/21/2015 0430   RBC 3.84* 04/21/2015 0430   HGB 11.6* 04/21/2015 0430   HCT 35.4* 04/21/2015 0430   PLT 211 04/21/2015 0430   MCV 92.2 04/21/2015 0430   MCH 30.2 04/21/2015 0430   MCHC 32.8 04/21/2015 0430   RDW 12.5 04/21/2015 0430   LYMPHSABS 1.6 04/13/2015 1440   MONOABS 0.3 04/13/2015 1440   EOSABS 0.1 04/13/2015 1440   BASOSABS 0.0 04/13/2015 1440     Treatments: surgery: see above  Discharge Exam: Blood pressure 150/74, pulse 126, temperature 98.2 F (36.8 C), temperature source Oral, resp. rate 16, height 5\' 5"  (1.651 m), weight 179 lb (81.194 kg), SpO2 100 %. General appearance: alert and cooperative Resp: clear to auscultation bilaterally Cardio: regular rate and rhythm, S1, S2 normal, no murmur, click, rub or gallop Extremities: extremities normal, atraumatic, no cyanosis or edema Incision/Wound: clean and intact  Disposition: 01-Home or Self  Care  Discharge Instructions    (HEART FAILURE PATIENTS) Call MD:  Anytime you have any of the following symptoms: 1) 3 pound weight gain in 24 hours or 5 pounds in 1 week 2) shortness of breath, with or without a dry hacking cough 3) swelling in the hands, feet or stomach 4) if you have to sleep on extra pillows at night in order to breathe.    Complete by:  As directed      Call MD for:  difficulty breathing, headache or visual disturbances    Complete by:  As directed      Call MD for:  extreme fatigue    Complete by:  As directed      Call MD for:  hives    Complete by:  As directed      Call MD for:  persistant dizziness or light-headedness    Complete by:  As directed      Call MD for:  persistant nausea and vomiting    Complete by:  As directed      Call MD for:  redness, tenderness, or signs of infection (pain, swelling, redness, odor or green/yellow discharge around incision site)    Complete by:  As directed      Call MD for:  severe uncontrolled pain    Complete by:  As directed      Call MD for:  temperature >100.4    Complete by:  As directed      Diet - low sodium heart healthy    Complete by:  As directed      Diet general  Complete by:  As directed      Driving Restrictions    Complete by:  As directed   No driving for 7 days or until off narcotic pain medication     Increase activity slowly    Complete by:  As directed      Remove dressing in 24 hours    Complete by:  As directed      Sexual Activity Restrictions    Complete by:  As directed   No intercourse for 6 weeks            Medication List    TAKE these medications        atenolol 50 MG tablet  Commonly known as:  TENORMIN  TAKE 1 TABLET DAILY     CENTRUM SILVER PO  Take 1 capsule by mouth 2 (two) times a week.     clorazepate 7.5 MG tablet  Commonly known as:  TRANXENE  Take 1 tablet (7.5 mg total) by mouth 2 (two) times daily as needed for anxiety.     docusate sodium 100 MG capsule   Commonly known as:  COLACE  Take 1 capsule (100 mg total) by mouth 2 (two) times daily.     fish oil-omega-3 fatty acids 1000 MG capsule  Take 1 g by mouth 2 (two) times a week.     Garlic Oil 8453 MG Caps  Take 1 capsule by mouth 2 (two) times a week.     ibuprofen 800 MG tablet  Commonly known as:  ADVIL,MOTRIN  Take 1 tablet (800 mg total) by mouth every 8 (eight) hours as needed (mild pain).     irbesartan-hydrochlorothiazide 300-12.5 MG per tablet  Commonly known as:  AVALIDE  take 1 tablet by mouth once daily     potassium chloride SA 20 MEQ tablet  Commonly known as:  K-DUR,KLOR-CON  Take 1 tablet (20 mEq total) by mouth daily.     predniSONE 5 MG tablet  Commonly known as:  DELTASONE  Take 0.5 tablets (2.5 mg total) by mouth daily.     raloxifene 60 MG tablet  Commonly known as:  EVISTA  Take 60 mg by mouth every morning.     traMADol 50 MG tablet  Commonly known as:  ULTRAM  Take 1 tablet (50 mg total) by mouth every 6 (six) hours as needed.     Vitamin D3 2000 UNITS capsule  Take 4,000 Units by mouth 2 (two) times a week.           Follow-up Information    Follow up with [name], MD In 2 weeks.   Specialty:  Obstetrics and Gynecology   Contact information:   [place] [address] [phone]       Signed: [name] 04/21/2015, 10:34 AM

## 2015-04-21 NOTE — Progress Notes (Signed)
EKG showed sinus tach. LV hypertrophy (similar to prior EKG preop). No cardiac or pulm symptoms. Otherwise normal vitals.  Will proceed with discharge.  [name], MD

## 2015-04-21 NOTE — Discharge Instructions (Signed)
04/21/2015  Return to work: 6 weeks  Activity: 1. Be up and out of the bed during the day.  Take a nap if needed.  You may walk up steps but be careful and use the hand rail.  Stair climbing will tire you more than you think, you may need to stop part way and rest.   2. No lifting or straining for 6 weeks.  3. No driving for 1 weeks.  Do Not drive if you are taking narcotic pain medicine.  4. Shower daily.  Use soap and water on your incision and pat dry; don't rub.   5. No sexual activity and nothing in the vagina for 6 weeks.  Diet: 1. Low sodium Heart Healthy Diet is recommended.  2. It is safe to use a laxative if you have difficulty moving your bowels.   Wound Care: 1. Keep clean and dry.  Shower daily.  Reasons to call the Doctor:   Fever - Oral temperature greater than 100.4 degrees Fahrenheit  Foul-smelling vaginal discharge  Difficulty urinating  Nausea and vomiting  Increased pain at the site of the incision that is unrelieved with pain medicine.  Difficulty breathing with or without chest pain  New calf pain especially if only on one side  Sudden, continuing increased vaginal bleeding with or without clots.   Follow-up: 1. See Everitt Amber in 2 weeks.  Contacts: For questions or concerns you should contact:  Dr. Everitt Amber at [phone]  or at [place]

## 2015-04-21 NOTE — Telephone Encounter (Signed)
Left message on voicemail with future appointment details. Pt is scheduled 05/07/2015 @ 11:45 with Dr. Denman George for post-op check

## 2015-04-23 ENCOUNTER — Encounter: Payer: Self-pay | Admitting: Gynecologic Oncology

## 2015-04-23 ENCOUNTER — Ambulatory Visit: Payer: Commercial Managed Care - HMO | Attending: Gynecologic Oncology | Admitting: Gynecologic Oncology

## 2015-04-23 VITALS — BP 137/75 | HR 89 | Temp 98.1°F | Resp 18 | Ht 65.0 in | Wt 179.4 lb

## 2015-04-23 DIAGNOSIS — C541 Malignant neoplasm of endometrium: Secondary | ICD-10-CM | POA: Diagnosis not present

## 2015-04-23 NOTE — Progress Notes (Signed)
Follow Up Note: Gyn-Onc  Sharon Holt 74 y.o. female  CC:  Chief Complaint  Patient presents with  . 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  She had a 2 month history of vaginal spotting and saw Dr. Garwin Brothers for her scheduled annual visit in May 2016. A Pap was performed which was positive for AGUS. She then underwent ultrasound evaluation, which revealed a thickened endometrial stripe at 8.8 mm with the uterus itself measured 8 x 5 x 6 cm. An endometrial biopsy was performed on 03/12/2015 which revealed high-grade adenocarcinoma with papillary features architecturally however the immunostains were not typical for serous adenocarcinoma.  On 04/20/2015, she underwent a Robotic total laparoscopic hysterectomy, bilateral salpingo oophorectomy, bilateral pelvic lymph node dissection, bilateral para-aortic lymph node dissection by Dr. Janie Morning.  On POD 1 she had tachycardia to 120, which was regular to palpate. EKG was ordered which revealed sinus tachycardia, and left ventricular hypertrophy (consistent with her HTN). Her EKG was essentially unchanged compared to preop with the exception of tachycardia. She was saturating at 100% on room air.  She felt anxiety was the cause of her tachycardia. She denied chest pain or SOB.  Final pathology revealed:  1. Lymph nodes, regional resection, right periaortic - SIX BENIGN LYMPH NODES (0/6). 2. Lymph nodes, regional resection, left periaortic - FIVE BENIGN LYMPH NODE (0/5). 3. Lymph nodes, regional resection, right pelvic - SEVEN BENIGN LYMPH NODES (0/7) 4. Lymph nodes, regional resection, left pelvic - EIGHT BENIGN LYMPH NODES (0/8) 5. Uterus and cervix, bilateral tubes and ovaries - ENDOMETRIAL ADENOCARCINOMA INVADING THE SUPERFICIAL  MYOMETRIUM. - MARGINS NOT INVOLVED. - RIGHT OVARY: MATURE CYSTIC TERATOMA. - CERVIX, RIGHT AND LEFT OVARIES AND RIGHT AND LEFT FALLOPIAN TUBES FREE OF TUMOR.  Interval History:  She presents today to the office with her husband daughter for post-operative follow up and heart rate evaluation due to tachycardia post-operatively inpatient.  She reports doing well since surgery with no pain.  Tolerating diet with no nausea or emesis.  She reports no pain and has not needed to take pain medication.  Bowels and bladder functioning without difficulty.  No vaginal spotting reported.  Denies chest pain, dyspnea.  "I told y'all I was nervous in the hospital."  No concerns voiced.     Review of Systems  Constitutional: Feels well.  No fever, chills, early satiety, unintentional weight loss or gain.  Cardiovascular: No chest pain, shortness of breath, or edema.  Pulmonary: No cough or wheeze.  Gastrointestinal: No nausea, vomiting, or diarrhea. No bright red blood per rectum or change in bowel movement.  Genitourinary: No frequency, urgency, or dysuria. No vaginal bleeding or discharge.  Musculoskeletal: No myalgia or joint pain. Neurologic: No weakness, numbness, or change in gait.  Psychology: No depression, anxiety, or insomnia.  Current Meds:  Outpatient Encounter Prescriptions as of 04/23/2015  Medication Sig  . atenolol (TENORMIN) 50 MG tablet TAKE 1 TABLET DAILY  . Cholecalciferol (VITAMIN D3) 2000 UNITS capsule Take 4,000 Units by mouth 2 (two) times a week.   . clorazepate (TRANXENE) 7.5 MG tablet Take 1 tablet (7.5 mg total) by mouth 2 (two) times daily as needed for anxiety.  . docusate sodium (COLACE) 100 MG capsule Take 1 capsule (100 mg total) by mouth 2 (  two) times daily.  . fish oil-omega-3 fatty acids 1000 MG capsule Take 1 g by mouth 2 (two) times a week.   . Garlic Oil 3212 MG CAPS Take 1 capsule by mouth 2 (two) times a week.   Marland Kitchen ibuprofen (ADVIL,MOTRIN) 800 MG tablet Take 1 tablet  (800 mg total) by mouth every 8 (eight) hours as needed (mild pain).  . irbesartan-hydrochlorothiazide (AVALIDE) 300-12.5 MG per tablet take 1 tablet by mouth once daily  . Multiple Vitamins-Minerals (CENTRUM SILVER PO) Take 1 capsule by mouth 2 (two) times a week.   . potassium chloride SA (K-DUR,KLOR-CON) 20 MEQ tablet Take 1 tablet (20 mEq total) by mouth daily.  . predniSONE (DELTASONE) 5 MG tablet Take 0.5 tablets (2.5 mg total) by mouth daily.  . raloxifene (EVISTA) 60 MG tablet Take 60 mg by mouth every morning.   . traMADol (ULTRAM) 50 MG tablet Take 1 tablet (50 mg total) by mouth every 6 (six) hours as needed.   No facility-administered encounter medications on file as of 04/23/2015.    Allergy: No Known Allergies  Social Hx:   History   Social History  . Marital Status: Married    Spouse Name: Vanisha Whiten  . Number of Children: N/A  . Years of Education: N/A   Occupational History  . Retired     Company secretary   Social History Main Topics  . Smoking status: Never Smoker   . Smokeless tobacco: Never Used  . Alcohol Use: No  . Drug Use: No  . Sexual Activity: Not on file   Other Topics Concern  . Not on file   Social History Narrative    Past Surgical Hx:  Past Surgical History  Procedure Laterality Date  . Cholecystectomy    . Tonsillectomy    . Breast lumpectomy      right breast BENIGN  . Robotic assisted total hysterectomy with bilateral salpingo oopherectomy Bilateral 04/20/2015    Procedure: ROBOTIC ASSISTED TOTAL HYSTERECTOMY WITH BILATERAL SALPINGO OOPHORECTOMY AND PELVIC AND PERIAORTIC LYMPHADECTOMY;  Surgeon: Janie Morning, MD;  Location: [facility];  Service: Gynecology;  Laterality: Bilateral;    Past Medical Hx:  Past Medical History  Diagnosis Date  . Unspecified essential hypertension   . Mitral valve disorders   . Other and unspecified hyperlipidemia   . Other abnormal glucose   . Anxiety state, unspecified   . Anemia, unspecified   .  Chronic hepatitis, unspecified     pt thinks type B  . Transfusion history     "back in my 20's"  . 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 Endometrial cancer   . Osteoporosis     Family Hx:  Family History  Problem Relation Age of Onset  . Prostate cancer Brother   . Stroke Father   . Breast cancer Sister     Vitals:  Blood pressure 137/75, pulse 89, temperature 98.1 F (36.7 C), temperature source Oral, resp. rate 18, height 5\' 5"  (1.651 m), weight 179 lb 6.4 oz (81.375 kg), SpO2 100 %.  Physical Exam:  General: Well developed, well nourished female in no acute distress. Alert and oriented x 3.  Cardiovascular: Regular rate and rhythm. S1 and S2 normal.  Lungs: Clear to auscultation bilaterally. No wheezes/crackles/rhonchi noted.  Skin: No rashes or lesions present. Back: No CVA tenderness.  Abdomen: Abdomen soft, non-tender and obese. Active bowel sounds in all quadrants. No evidence of a fluid wave  or abdominal masses.  Extremities: No bilateral cyanosis, edema, or clubbing.   Assessment/Plan:  74 year old s/p robotic hysterectomy, BSO, bilateral pelvic and para-aortic lymph node dissection for a Stage IA Grade 2 endometrial adenocarcinoma.  She is doing well post-operatively.  Heart rate stable at 89 with regular rate and rhythm noted.  Post-operative instructions reinforced.  Final pathology discussed.  Patient advised she would be contacted with further recommendations for adjuvant therapy if needed.  She is to follow up as scheduled.  Reportable signs and symptoms reviewed.  She is advised to call for any questions or concerns.  The patient was reviewed with Dr. Denman George, who agrees with above plan.      Hang Ammon DEAL, NP 04/23/2015, 12:33 PM

## 2015-04-23 NOTE — Patient Instructions (Signed)
Doing great post-operatively.  Please call for any questions or concerns.

## 2015-05-07 ENCOUNTER — Ambulatory Visit: Payer: Commercial Managed Care - HMO | Attending: Gynecologic Oncology | Admitting: Gynecologic Oncology

## 2015-05-07 ENCOUNTER — Encounter: Payer: Self-pay | Admitting: Gynecologic Oncology

## 2015-05-07 VITALS — BP 134/84 | HR 103 | Temp 98.8°F | Resp 19 | Ht 65.0 in | Wt 173.8 lb

## 2015-05-07 DIAGNOSIS — Z7189 Other specified counseling: Secondary | ICD-10-CM

## 2015-05-07 DIAGNOSIS — C541 Malignant neoplasm of endometrium: Secondary | ICD-10-CM | POA: Diagnosis not present

## 2015-05-07 NOTE — Progress Notes (Signed)
Follow Up Note: Gyn-Onc  Sharon Holt 74 y.o. female  CC:  Chief Complaint  Patient presents with  . endometrial cancer    post-op check  Treatment counseling.  Assessment/Plan:  74 year old s/p robotic hysterectomy, BSO, bilateral pelvic and para-aortic lymph node dissection for a Stage IA Grade 2 endometrial adenocarcinoma. She has high/intermediate risk factors for endometrial cancer with a grade 2 tumor and invasion of 18mm of 1.1cm thickness. LVSI was not present, and pelvic and PA node assessment was negative (as was cervix and adnexa). Therefore we are recommending adjuvant vaginal brachytherapy to reduce the risk for local recurrence.  Discussed with Shakea that this therapy is recommended for local recurrence rate risk reduction however does not impact overall survival. I discussed that it is a very well-tolerated adjuvant therapy. We will establish care with her radiation oncology provider from her prior anal cancer, Dr. Sondra Come, to evaluate and consider treatment. I know that she is received external beam radiation for her anal cancer, however I believe that vaginal break in therapy may still be feasible however I will defer to Dr. Sondra Come regarding this.  Given her history of 2 prior cancers (anal and endometrial) I'm recommending referral to genetics counselor for consideration of a hereditary cancer syndrome such as Lynch syndrome.  I'm a postoperative standpoint she is doing very well. She is healing optimally and no intervention is necessary. Her heart rate remains mildly elevated at 103. Of note it was particularly elevated postoperatively. But normalized at postop check. She feels this is secondary to anxiety.  I will see her back after radiation therapy is complete.   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  She had a 2 month history of  vaginal spotting and saw Dr. Garwin Brothers for her scheduled annual visit in May 2016. A Pap was performed which was positive for AGUS. She then underwent ultrasound evaluation, which revealed a thickened endometrial stripe at 8.8 mm with the uterus itself measured 8 x 5 x 6 cm. An endometrial biopsy was performed on 03/12/2015 which revealed high-grade adenocarcinoma with papillary features architecturally however the immunostains were not typical for serous adenocarcinoma.  On 04/20/2015, she underwent a Robotic total laparoscopic hysterectomy, bilateral salpingo oophorectomy, bilateral pelvic lymph node dissection, bilateral para-aortic lymph node dissection by Dr. Janie Morning.  On POD 1 she had tachycardia to 120, which was regular to palpate. EKG was ordered which revealed sinus tachycardia, and left ventricular hypertrophy (consistent with her HTN). Her EKG was essentially unchanged compared to preop with the exception of tachycardia. She was saturating at 100% on room air.  She felt anxiety was the cause of her tachycardia. She denied chest pain or SOB.  Final pathology revealed:  1. Lymph nodes, regional resection, right periaortic - SIX BENIGN LYMPH NODES (0/6). 2. Lymph nodes, regional resection, left periaortic - FIVE BENIGN LYMPH NODE (0/5). 3. Lymph nodes, regional resection, right pelvic - SEVEN BENIGN LYMPH NODES (0/7) 4. Lymph nodes, regional resection, left pelvic - EIGHT BENIGN LYMPH NODES (0/8) 5. Uterus and cervix, bilateral tubes and ovaries - ENDOMETRIAL ADENOCARCINOMA INVADING THE SUPERFICIAL MYOMETRIUM. - MARGINS NOT INVOLVED. - RIGHT OVARY: MATURE CYSTIC TERATOMA. - CERVIX, RIGHT AND LEFT OVARIES AND RIGHT AND LEFT FALLOPIAN TUBES FREE OF TUMOR.  We requested that her initial pathology from the biopsy be compared to the postop biopsy due to  the descrepancy (grade 2 endometrioid tumor on hysterectomy, high-grade adenocarcinoma with architectural papillary features but  immunohistochemicalstaining not typical for serous carcinoma on biopsy ). There was concordance between the reads. Because the larger volume of tumor (the hysterectomy specimen) showed no serous features, we will assume that this is a grade 2 endometrioid lesion, not serous.  Interval History:  She presents today for a postoperative check. She is doing well with no complaints.  Review of Systems  Constitutional: Feels well.  No fever, chills, early satiety, unintentional weight loss or gain.  Cardiovascular: No chest pain, shortness of breath, or edema.  Pulmonary: No cough or wheeze.  Gastrointestinal: No nausea, vomiting, or diarrhea. No bright red blood per rectum or change in bowel movement.  Genitourinary: No frequency, urgency, or dysuria. No vaginal bleeding or discharge.  Musculoskeletal: No myalgia or joint pain. Neurologic: No weakness, numbness, or change in gait.  Psychology: No depression, anxiety, or insomnia.  Current Meds:  Outpatient Encounter Prescriptions as of 05/07/2015  Medication Sig  . atenolol (TENORMIN) 50 MG tablet TAKE 1 TABLET DAILY  . Cholecalciferol (VITAMIN D3) 2000 UNITS capsule Take 4,000 Units by mouth 2 (two) times a week.   . clorazepate (TRANXENE) 7.5 MG tablet Take 1 tablet (7.5 mg total) by mouth 2 (two) times daily as needed for anxiety.  . fish oil-omega-3 fatty acids 1000 MG capsule Take 1 g by mouth 2 (two) times a week.   . Garlic Oil 2130 MG CAPS Take 1 capsule by mouth 2 (two) times a week.   Marland Kitchen ibuprofen (ADVIL,MOTRIN) 800 MG tablet Take 1 tablet (800 mg total) by mouth every 8 (eight) hours as needed (mild pain).  . irbesartan-hydrochlorothiazide (AVALIDE) 300-12.5 MG per tablet take 1 tablet by mouth once daily  . Multiple Vitamins-Minerals (CENTRUM SILVER PO) Take 1 capsule by mouth 2 (two) times a week.   . potassium chloride SA (K-DUR,KLOR-CON) 20 MEQ tablet Take 1 tablet (20 mEq total) by mouth daily.  . predniSONE (DELTASONE) 5 MG tablet  Take 0.5 tablets (2.5 mg total) by mouth daily.  . raloxifene (EVISTA) 60 MG tablet Take 60 mg by mouth every morning.   . [DISCONTINUED] docusate sodium (COLACE) 100 MG capsule Take 1 capsule (100 mg total) by mouth 2 (two) times daily. (Patient not taking: Reported on 05/07/2015)  . [DISCONTINUED] traMADol (ULTRAM) 50 MG tablet Take 1 tablet (50 mg total) by mouth every 6 (six) hours as needed.   No facility-administered encounter medications on file as of 05/07/2015.    Allergy: No Known Allergies  Social Hx:   History   Social History  . Marital Status: Married    Spouse Name: Collen Hostler  . Number of Children: N/A  . Years of Education: N/A   Occupational History  . Retired     Company secretary   Social History Main Topics  . Smoking status: Never Smoker   . Smokeless tobacco: Never Used  . Alcohol Use: No  . Drug Use: No  . Sexual Activity: Not on file   Other Topics Concern  . Not on file   Social History Narrative    Past Surgical Hx:  Past Surgical History  Procedure Laterality Date  . Cholecystectomy    . Tonsillectomy    . Breast lumpectomy      right breast BENIGN  . Robotic assisted total hysterectomy with bilateral salpingo oopherectomy Bilateral 04/20/2015    Procedure: ROBOTIC ASSISTED TOTAL HYSTERECTOMY WITH BILATERAL SALPINGO OOPHORECTOMY AND PELVIC AND  PERIAORTIC LYMPHADECTOMY;  Surgeon: Janie Morning, MD;  Location: [facility];  Service: Gynecology;  Laterality: Bilateral;    Past Medical Hx:  Past Medical History  Diagnosis Date  . Unspecified essential hypertension   . Mitral valve disorders   . Other and unspecified hyperlipidemia   . Other abnormal glucose   . Anxiety state, unspecified   . Anemia, unspecified   . Chronic hepatitis, unspecified     pt thinks type B  . Transfusion history     "back in my 20's"  . 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 Endometrial cancer   . Osteoporosis     Family Hx:   Family History  Problem Relation Age of Onset  . Prostate cancer Brother   . Stroke Father   . Breast cancer Sister     Vitals:  Blood pressure 134/84, pulse 103, temperature 98.8 F (37.1 C), temperature source Oral, resp. rate 19, height 5\' 5"  (1.651 m), weight 173 lb 12.8 oz (78.835 kg), SpO2 100 %.  Physical Exam:  General: Well developed, well nourished female in no acute distress. Alert and oriented x 3.  Cardiovascular: Regular rate and rhythm. S1 and S2 normal.  Lungs: Clear to auscultation bilaterally. No wheezes/crackles/rhonchi noted.  Skin: No rashes or lesions present. Back: No CVA tenderness.  Gyn: normal external female genitalia, normal vagina. Vaginal cuff closed and in tact with no lesions. Abdomen: Abdomen soft, non-tender and obese. Active bowel sounds in all quadrants. No evidence of a fluid wave or abdominal masses. Incisions clean, dry and in tact. Extremities: No bilateral cyanosis, edema, or clubbing.   [name] 05/07/2015, 5:07 PM

## 2015-05-07 NOTE — Patient Instructions (Addendum)
Healing nicely post-operatively.  Avoid intercourse for another month.    Plan to meet with Dr. Sondra Come for vaginal brachytherapy at the top of the vagina on May 26, 2015 at 9:30am in the ground level of the cancer center.  You will receive a phone call from the [facility] with a date and time to meet with the genetics counselor to see if you have a predisposition genetically to having cancer due to her history of having two different cancers.  Plan to follow up with Dr. Denman George after the completion of your radiation.  Please call for any questions or concerns.

## 2015-05-10 ENCOUNTER — Telehealth: Payer: Self-pay | Admitting: Genetic Counselor

## 2015-05-10 NOTE — Telephone Encounter (Signed)
Genetic appt-s/w patient and gave genetic appt for 08/08 @ 10 w/Karen Florene Glen.  Referring Dr. Denman George Dx-H/o of both anal and endometrial ca

## 2015-05-19 NOTE — Progress Notes (Signed)
GYN Location of Tumor / Histology: endometrial cancer  Sharon Holt presented, per Dr. Denman George "She had a 2 month history of vaginal spotting and saw Dr. Garwin Brothers for her scheduled annual visit in May 2016. A Pap was performed which was positive for AGUS. She then underwent ultrasound evaluation, which revealed a thickened endometrial stripe at 8.8 mm with the uterus itself measured 8 x 5 x 6 cm. An endometrial biopsy was performed on 03/12/2015 which revealed high-grade adenocarcinoma with papillary features architecturally however the immunostains were not typical for serous adenocarcinoma."  Biopsies revealed:   04/20/15 Diagnosis 1. Lymph nodes, regional resection, right periaortic - SIX BENIGN LYMPH NODES (0/6). 2. Lymph nodes, regional resection, left periaortic - FIVE BENIGN LYMPH NODE (0/5). 3. Lymph nodes, regional resection, right pelvic - SEVEN BENIGN LYMPH NODES (0/7) 4. Lymph nodes, regional resection, left pelvic - EIGHT BENIGN LYMPH NODES (0/8) 5. Uterus and cervix, bilateral tubes and ovaries - ENDOMETRIAL ADENOCARCINOMA INVADING THE SUPERFICIAL MYOMETRIUM. - MARGINS NOT INVOLVED. - RIGHT OVARY: MATURE CYSTIC TERATOMA. - CERVIX, RIGHT AND LEFT OVARIES AND RIGHT AND LEFT FALLOPIAN TUBES FREE OF TUMOR.  Past/Anticipated interventions by Gyn/Onc surgery, if any: 04/20/15 - Procedure: ROBOTIC ASSISTED TOTAL HYSTERECTOMY WITH BILATERAL SALPINGO OOPHORECTOMY AND PELVIC AND PERIAORTIC LYMPHADECTOMY;  Surgeon: Janie Morning, MD;  Location: [facility];  Service: Gynecology;  Laterality: Bilateral;  Past/Anticipated interventions by medical oncology, if any: unknown  Weight changes, if any: has lost about 3 lb since surgery on 04/20/15.  Reports her appetite was not good but is improving.  Bowel/Bladder complaints, if any: denies bowel/bladder issues  Nausea/Vomiting, if any: no  Pain issues, if any:  Yes - reports soreness/tingling in her left groin area that started after  surgery.  SAFETY ISSUES:  Prior radiation? Yes - 16 years ago  Pacemaker/ICD? no  Possible current pregnancy? no  Is the patient on methotrexate? no  Current Complaints / other details:  Patient is here with her husband.    BP 128/113 mmHg  Pulse 77  Temp(Src) 98.5 F (36.9 C) (Oral)  Resp 16  Wt 177 lb 4.8 oz (80.423 kg)  SpO2 100%

## 2015-05-21 LAB — HM MAMMOGRAPHY: HM Mammogram: NORMAL (ref 0–4)

## 2015-05-24 ENCOUNTER — Other Ambulatory Visit: Payer: Commercial Managed Care - HMO

## 2015-05-24 ENCOUNTER — Encounter: Payer: Self-pay | Admitting: Genetic Counselor

## 2015-05-24 ENCOUNTER — Other Ambulatory Visit: Payer: Self-pay | Admitting: *Deleted

## 2015-05-24 ENCOUNTER — Ambulatory Visit (HOSPITAL_BASED_OUTPATIENT_CLINIC_OR_DEPARTMENT_OTHER): Payer: Commercial Managed Care - HMO | Admitting: Genetic Counselor

## 2015-05-24 DIAGNOSIS — Z803 Family history of malignant neoplasm of breast: Secondary | ICD-10-CM | POA: Diagnosis not present

## 2015-05-24 DIAGNOSIS — Z315 Encounter for genetic counseling: Secondary | ICD-10-CM | POA: Diagnosis not present

## 2015-05-24 DIAGNOSIS — C541 Malignant neoplasm of endometrium: Secondary | ICD-10-CM

## 2015-05-24 DIAGNOSIS — Z8042 Family history of malignant neoplasm of prostate: Secondary | ICD-10-CM

## 2015-05-24 DIAGNOSIS — Z85048 Personal history of other malignant neoplasm of rectum, rectosigmoid junction, and anus: Secondary | ICD-10-CM

## 2015-05-24 DIAGNOSIS — C55 Malignant neoplasm of uterus, part unspecified: Secondary | ICD-10-CM | POA: Diagnosis not present

## 2015-05-24 NOTE — Progress Notes (Signed)
REFERRING PROVIDER: Everitt Amber, MD  PRIMARY PROVIDER:  Walker Kehr, MD  PRIMARY REASON FOR VISIT:  1. Uterine cancer   2. History of rectal or anal cancer   3. Family history of breast cancer in sister   80. Family history of prostate cancer      HISTORY OF PRESENT ILLNESS:   Sharon Holt, a 74 y.o. female, was seen for a  cancer genetics consultation at the request of Dr. Denman George due to a personal history of endometrial and anal cancers and family history of cancer.  Sharon. Avera presents to clinic today with her husband to discuss the possibility of a hereditary predisposition to cancer, genetic testing, and to further clarify her future cancer risks, as well as potential cancer risks for family members.   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 This was treated with TAH-BSO, likely to be followed by adjuvant vaginal brachytherapy.  In 2001, at the age of 53, she was diagnosed with cancer of the anal canal which was treated with surgery and radiation.  Sharon Holt also has a history of a benign right breast lumpectomy in 2012.      CANCER HISTORY:   No history exists.  2016 - Serous adenocarcinoma of uterus 2001 - Cancer of anal canal   HORMONAL RISK FACTORS:  Menarche was at age 38.  First live birth at age 43 (daughter who is currently 41). OCP use for approximately 21-22 years.  Ovaries intact: no.  Hysterectomy: yes.  Menopausal status: postmenopausal.  HRT use: 0 years. Colonoscopy: yes; normal. Mammogram within the last year: yes. Number of breast biopsies: 1. Up to date with pelvic exams:  yes. Any excessive radiation exposure in the past:  no  Past Medical History  Diagnosis Date  . Unspecified essential hypertension   . Mitral valve disorders   . Other and unspecified hyperlipidemia   . Other abnormal glucose   . Anxiety state, unspecified   . Anemia, unspecified   . Chronic hepatitis,  unspecified     pt thinks type B  . Transfusion history     "back in my 20's"  . 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 Osteoporosis   . Endometrial cancer 04/2015    serous adenocarcinoma    Past Surgical History  Procedure Laterality Date  . Cholecystectomy    . Tonsillectomy    . Breast lumpectomy      right breast BENIGN  . Robotic assisted total hysterectomy with bilateral salpingo oopherectomy Bilateral 04/20/2015    Procedure: ROBOTIC ASSISTED TOTAL HYSTERECTOMY WITH BILATERAL SALPINGO OOPHORECTOMY AND PELVIC AND PERIAORTIC LYMPHADECTOMY;  Surgeon: Janie Morning, MD;  Location: [facility];  Service: Gynecology;  Laterality: Bilateral;    History   Social History  . Marital Status: Married    Spouse Name: Barbera Perritt  . Number of Children: N/A  . Years of Education: N/A   Occupational History  . Retired     Company secretary   Social History Main Topics  . Smoking status: Never Smoker   . Smokeless tobacco: Never Used  . Alcohol Use: No  . Drug Use: No  . Sexual Activity: Not on file   Other Topics Concern  . Not on file   Social History Narrative     FAMILY HISTORY:  We obtained a detailed, 4-generation family history.  Significant diagnoses are listed below: Family History  Problem Relation Age of Onset  . Prostate cancer Brother     dx. 25s  . Stroke Father   . Breast cancer Sister     dx. 64-65  . Dementia Brother   . Breast cancer Sister 54    mastectomy  . Leukemia Sister     dx. 47s-70s    Sharon Holt has four daughters.  One daughter passed away at the age of 4 in a MVA.  Three daughters are alive and cancer-free in their 62s.  Sharon Holt has one grandson and one granddaughter who are both cancer-free.  Sharon Holt has four full sisters and seven full brothers.  Four brothers died from drowning in their teens.  One brother was diagnosed with prostate cancer in his 72s and passed away at 49.  Another brother had  dementia and died in his late 18s.  Sharon Holt has one brother who is still alive and cancer-free at 2.  She has one sister who is still living at 66; this sister was diagnosed with breast cancer at 65 and who underwent a mastectomy.  Another sister was diagnosed with breast cancer at 73-65, and passed away at 49.  A third sister was diagnosed with leukemia in her late 72s-early 70s and passed way at 66.  The fourth sister died later in life, but did not have cancer.  There is no cancer in any of Sharon Holt's nieces or nephews.  Sharon Holt's mother died at 43 with dementia.  There is not maternal family history of cancer.  Sharon Holt mother had two full sisters and one full brother--all of whom died in their 43s.  Both of Sharon Holt's maternal grandparents died in their 58s-90s.  She is unaware of cancer in any of her maternal cousins.  Sharon Holt's father died of a stroke around the age of 37.  He had five full sisters and 9-10 full brothers.  None of these paternal aunts or uncles had cancer to Sharon Holt's knowledge, but she has limited information for these relatives.  One of Sharon Holt's paternal aunts is alive and cancer-free at the age of 72.  This aunt had three daughters all of whom Sharon Holt believes have been diagnosed with an unknown type of cancer.  These cousins live in [place], however, and she is not in contact with them.  Sharon Holt's paternal grandparents passed away in their 79s, but did not have cancer.  Patient's maternal ancestors are of African American/Caucasian/Cherokee descent, and paternal ancestors are of African American descent. There is no reported Ashkenazi Jewish ancestry. There is no known consanguinity.  GENETIC COUNSELING ASSESSMENT: Sharon Holt is a 74 y.o. female with a personal and family history of cancer which is somewhat suggestive of a hereditary cancer syndrome and predisposition to cancer. We, therefore, discussed and recommended  the following at today's visit.   DISCUSSION: We reviewed the characteristics, features and inheritance patterns of hereditary cancer syndromes, particularly those caused by mutations within the Lynch syndrome genes. We also discussed genetic testing, including the appropriate family members to test, the process of testing, insurance coverage and turn-around-time for results. We discussed the implications of a negative, positive and/or variant of uncertain significant result. At this point in time, Sharon Holt is not sure that she is interested in genetic testing.  We discussed that oftentimes tumor testing is performed on endometrial tumors, and that sometimes this can give [place] a better idea as to whether or not further genetic testing is  indicated.  Since Sharon Holt is not certain about genetic testing at this time, we discussed that we can find out more about these tumor test results and get back in touch with her when we find out more.  At that point in time, we will further discuss genetic testing, if indicated.  PLAN: Despite our discussion, Sharon. Nolte did not wish to pursue genetic testing at today's visit.  She would be interested in a further conversation about genetic testing following what we will learn about her tumor test results.  Thus, we will follow-up with her regarding these test results, and, in the meantime, we recommend Sharon. Solecki continue to follow the cancer screening guidelines given by her primary healthcare provider.  Lastly, we encouraged Sharon. Perfect to remain in contact with cancer genetics annually so that we can continuously update the family history and inform her of any changes in cancer genetics and testing that may be of benefit for this family.   Sharon.  Haverstick's questions were answered to her satisfaction today. Our contact information was provided should additional questions or concerns arise. Thank you for the referral and allowing [place] to share in the care of your  patient.   Jeanine Luz, Sharon Genetic Counselor [email or web address] Phone: [phone]  The patient was seen for a total of 60 minutes in face-to-face genetic counseling.  This patient was discussed with Drs. Magrinat, Lindi Adie and/or Burr Medico who agrees with the above.    _______________________________________________________________________ For Office Staff:  Number of people involved in session: 2 Was an Intern/ student involved with case: no

## 2015-05-26 ENCOUNTER — Encounter: Payer: Self-pay | Admitting: Gynecologic Oncology

## 2015-05-26 ENCOUNTER — Telehealth: Payer: Self-pay | Admitting: Genetic Counselor

## 2015-05-26 ENCOUNTER — Ambulatory Visit
Admission: RE | Admit: 2015-05-26 | Discharge: 2015-05-26 | Disposition: A | Discharge status: Home / Self Care | Payer: Commercial Managed Care - HMO | Source: Ambulatory Visit | Attending: Radiation Oncology | Admitting: Radiation Oncology

## 2015-05-26 ENCOUNTER — Encounter: Payer: Self-pay | Admitting: Radiation Oncology

## 2015-05-26 VITALS — BP 128/113 | HR 77 | Temp 98.5°F | Resp 16 | Wt 177.3 lb

## 2015-05-26 DIAGNOSIS — Z806 Family history of leukemia: Secondary | ICD-10-CM | POA: Insufficient documentation

## 2015-05-26 DIAGNOSIS — Z85048 Personal history of other malignant neoplasm of rectum, rectosigmoid junction, and anus: Secondary | ICD-10-CM | POA: Diagnosis not present

## 2015-05-26 DIAGNOSIS — E785 Hyperlipidemia, unspecified: Secondary | ICD-10-CM | POA: Diagnosis not present

## 2015-05-26 DIAGNOSIS — C541 Malignant neoplasm of endometrium: Secondary | ICD-10-CM | POA: Insufficient documentation

## 2015-05-26 DIAGNOSIS — I10 Essential (primary) hypertension: Secondary | ICD-10-CM | POA: Insufficient documentation

## 2015-05-26 DIAGNOSIS — Z803 Family history of malignant neoplasm of breast: Secondary | ICD-10-CM | POA: Insufficient documentation

## 2015-05-26 DIAGNOSIS — Z923 Personal history of irradiation: Secondary | ICD-10-CM | POA: Insufficient documentation

## 2015-05-26 DIAGNOSIS — Z8042 Family history of malignant neoplasm of prostate: Secondary | ICD-10-CM | POA: Insufficient documentation

## 2015-05-26 DIAGNOSIS — Z90722 Acquired absence of ovaries, bilateral: Secondary | ICD-10-CM | POA: Insufficient documentation

## 2015-05-26 DIAGNOSIS — Z90710 Acquired absence of both cervix and uterus: Secondary | ICD-10-CM | POA: Insufficient documentation

## 2015-05-26 DIAGNOSIS — Z510 Encounter for antineoplastic radiation therapy: Secondary | ICD-10-CM | POA: Diagnosis present

## 2015-05-26 DIAGNOSIS — Z79899 Other long term (current) drug therapy: Secondary | ICD-10-CM | POA: Insufficient documentation

## 2015-05-26 NOTE — Telephone Encounter (Signed)
Discussed with Sharon Holt that the tumor testing (MSI/IHC) which we had discussed was not ordered for her following her surgery for endometrial cancer.  Thus, we will add that testing and the results of that testing should better tell us whether or not genetic testing is indicated.  It will take a little less than 2 weeks to get these results back and she will hear from me regarding those results at that time.  Sharon Holt is in agreement with this plan.

## 2015-05-26 NOTE — Progress Notes (Signed)
Radiation Oncology         [phone] ________________________________  Initial Outpatient Consultation  Name: Sharon Holt MRN: [id number]  Date: 05/26/2015  DOB: 08/05/1941  CC:Sharon Kehr, MD  Everitt Amber, MD   REFERRING PHYSICIAN: Everitt Amber, MD  DIAGNOSIS: The encounter diagnosis was Endometrial cancer. (Stage IA Grade 2)  HISTORY OF PRESENT ILLNESS::Sharon Holt is a 74 y.o. female who presented, per Dr. Denman George "She had a 2 month history of vaginal spotting and saw Dr. Garwin Brothers for her scheduled annual visit in May 2016. A Pap was performed which was positive for AGUS. She then underwent ultrasound evaluation, which revealed a thickened endometrial stripe at 8.8 mm with the uterus itself measured 8 x 5 x 6 cm. An endometrial biopsy was performed on 03/12/2015 which revealed high-grade adenocarcinoma with papillary features architecturally however the immunostains were not typical for serous adenocarcinoma.  The patient was referred to Dr. Denman George and proceeded to undergo a robotic hysterectomy, BSO, bilateral pelvic and periaortic lymph node dissection. The patient was found to have a grade 2 endometrial cancer, stage IA. No pelvic or periaortic nodal metastasis. The tumor did penetrate to a depth of 35 mm in a 1.1 cm thickness of the myometrium. As above the patient's initial biopsy did show high-grade adenocarcinoma.  The patient has done well since her surgery. In light of her intermediate/high risk features she is now referred to radiation oncology for consideration for vaginal vault brachytherapy to reduce risk for local recurrence.    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   PAST SURGICAL HISTORY: Past Surgical History  Procedure Laterality Date  . Cholecystectomy    . Tonsillectomy    . Breast lumpectomy      right breast BENIGN  . Robotic assisted total hysterectomy with bilateral salpingo oopherectomy Bilateral 04/20/2015    Procedure: ROBOTIC ASSISTED TOTAL HYSTERECTOMY WITH BILATERAL SALPINGO OOPHORECTOMY AND PELVIC AND PERIAORTIC LYMPHADECTOMY;  Surgeon: Janie Morning, MD;  Location: [facility];  Service: Gynecology;  Laterality: Bilateral;    FAMILY HISTORY: family history includes Breast cancer in her sister; Breast cancer (age of onset: 79) in her sister; Dementia in her brother; Leukemia in her sister; Prostate cancer in her brother; Stroke in her father.  SOCIAL HISTORY:  reports that she has never smoked. She has never used smokeless tobacco. She reports that she does not drink alcohol or use illicit drugs.  ALLERGIES: Review of patient's allergies indicates no known allergies.  MEDICATIONS:  Current Outpatient Prescriptions  Medication Sig Dispense Refill  . atenolol (TENORMIN) 50 MG tablet TAKE 1 TABLET DAILY 90 tablet 3  . clorazepate (TRANXENE) 7.5 MG tablet Take 1 tablet (7.5 mg total) by mouth 2 (two) times daily as needed for anxiety. 180 tablet 1  . ibuprofen (ADVIL,MOTRIN) 800 MG tablet Take 1 tablet (800 mg total) by mouth every 8 (eight) hours as needed (mild pain). 30 tablet 0  . irbesartan-hydrochlorothiazide (AVALIDE) 300-12.5 MG per tablet take 1 tablet by mouth once daily 30 tablet 5  .  potassium chloride SA (K-DUR,KLOR-CON) 20 MEQ tablet Take 1 tablet (20 mEq total) by mouth daily. 90 tablet 3  . predniSONE (DELTASONE) 5 MG tablet Take  0.5 tablets (2.5 mg total) by mouth daily. 45 tablet 5  . raloxifene (EVISTA) 60 MG tablet Take 60 mg by mouth every morning.     . Cholecalciferol (VITAMIN D3) 2000 UNITS capsule Take 4,000 Units by mouth 2 (two) times a week.     . fish oil-omega-3 fatty acids 1000 MG capsule Take 1 g by mouth 2 (two) times a week.     . Garlic Oil 3536 MG CAPS Take 1 capsule by mouth 2 (two) times a week.     . Multiple Vitamins-Minerals (CENTRUM SILVER PO) Take 1 capsule by mouth 2 (two) times a week.      No current facility-administered medications for this encounter.    REVIEW OF SYSTEMS:  A 15 point review of systems is documented in the electronic medical record. This was obtained by the nursing staff. However, I reviewed this with the patient to discuss relevant findings and make appropriate changes.  Pertinent items are noted in HPI.  she reports no complications from her prior radiation therapy for stage I anal carcinoma. Prior to her most recent diagnosis she denies any vaginal bleeding or pelvic pain. She denies any vaginal bleeding or pelvic pain at this time.   PHYSICAL EXAM:  weight is 177 lb 4.8 oz (80.423 kg). Her oral temperature is 98.5 F (36.9 C). Her blood pressure is 128/113 and her pulse is 77. Her respiration is 16 and oxygen saturation is 100%.   _   General: Well-developed, in no acute distress, accompanied by husband on evaluation today HEENT: Normocephalic, atraumatic Cardiovascular: Regular rate and rhythm Respiratory: Clear to auscultation bilaterally GI: Soft, nontender, normal bowel sounds Extremities: No edema present No palpable cervical, supraclavicular or axillary lymphoadenopathy Small scars from laparoscopic procedure, with no evidence of drainage or infection No palpable inguinal adenopathy Pelvis exam deferred until simulation and planning day.  ECOG = 1    LABORATORY DATA:  Lab Results  Component Value Date   WBC 9.0 04/21/2015   HGB 11.6* 04/21/2015    HCT 35.4* 04/21/2015   MCV 92.2 04/21/2015   PLT 211 04/21/2015   NEUTROABS 2.1 04/13/2015   Lab Results  Component Value Date   NA 137 04/21/2015   K 4.0 04/21/2015   CL 104 04/21/2015   CO2 25 04/21/2015   GLUCOSE 188* 04/21/2015   CREATININE 0.93 04/21/2015   CALCIUM 8.8* 04/21/2015    RADIOGRAPHY: No results found.   IMPRESSION: :Sharon Holt is a 74 year old female s/p robotic hysterectomy, BSO, bilateral pelvic and para-aortic lymph node dissection for a Stage IA Grade 2 endometrial adenocarcinoma, with initial endometrial biopsy showing grade 3. She has high/intermediate risk factors for endometrial cancer with a grade 2/3 tumor and invasion of 1mm of 1.1cm thickness. LVSI was not present, and pelvic and PA node assessment was negative (as was cervix and adnexa). I would agree with recommendations for adjuvant vaginal brachytherapy to reduce the risk for local recurrence. The patient would be a candidate for additional radiation therapy in light of the interval since her prior treatment and the fact that she is not receiving external beam radiation therapy with her planned course of treatment at this time.  PLAN: We discussed the possible side effects and risks of treatment in addition to the possible benefits of treatment. We discussed the protocol  for radiation treatment.  All of the patient's questions were answered. The patient does wish to proceed with this treatment. A simulation will be scheduled such that we can proceed with treatment planning.  I anticipate 4 treatments, one to two times a week. I may lower her cumulative brachytherapy dose slightly in light of her prior external beam radiation therapy.      ------------------------------------------------  Blair Promise, PhD, MD     This document serves as a record of services personally performed by Gery Pray, MD. It was created on his behalf by Derek Mound, a trained medical scribe. The creation of  this record is based on the scribe's personal observations and the provider's statements to them. This document has been checked and approved by the attending provider.

## 2015-05-26 NOTE — Progress Notes (Signed)
Spoke with [facility] about patient being unsure whether she would like to proceed with genetic testing.  MSI or IHC not tested on surgical specimen from July.  Called pathology and spoke with Maudie Mercury.  We will add on MSI or IHC testing to the tumor.

## 2015-05-26 NOTE — Progress Notes (Signed)
Please see the Nurse Progress Note in the MD Initial Consult Encounter for this patient. 

## 2015-05-28 ENCOUNTER — Telehealth: Payer: Self-pay | Admitting: *Deleted

## 2015-05-28 NOTE — Telephone Encounter (Signed)
CALLED PATIENT TO INFORM OF HDR VAG. CUFF CASE ON 06-01-15 - ARRIVAL TIME - 7:50 AM, SPOKE WITH PATIENT AND SHE IS AWARE OF ALL APPTS. TIMES AND DATES

## 2015-05-31 ENCOUNTER — Telehealth: Payer: Self-pay | Admitting: Oncology

## 2015-05-31 NOTE — Telephone Encounter (Signed)
Quaniyah called and said she has had a small amount of vaginal bleeding that started yesterday.  She noticed it when wiping.  She reports she had a little this morning and has not had any since.  She said she was more active and was going up stairs over the weekend.  Advised her to call if she has any more bleeding otherwise we will see her in the morning.

## 2015-06-01 ENCOUNTER — Ambulatory Visit
Admission: RE | Admit: 2015-06-01 | Discharge: 2015-06-01 | Disposition: A | Discharge status: Home / Self Care | Payer: Commercial Managed Care - HMO | Source: Ambulatory Visit | Attending: Radiation Oncology | Admitting: Radiation Oncology

## 2015-06-01 ENCOUNTER — Encounter: Payer: Self-pay | Admitting: Radiation Oncology

## 2015-06-01 VITALS — BP 153/78 | HR 78 | Temp 98.5°F | Resp 16 | Ht 65.0 in | Wt 176.9 lb

## 2015-06-01 DIAGNOSIS — Z510 Encounter for antineoplastic radiation therapy: Secondary | ICD-10-CM | POA: Diagnosis not present

## 2015-06-01 DIAGNOSIS — C541 Malignant neoplasm of endometrium: Secondary | ICD-10-CM

## 2015-06-01 NOTE — Progress Notes (Signed)
  Radiation Oncology         [phone] ________________________________  Name: Sharon Holt MRN: [id number]  Date: 06/01/2015  DOB: 05-Jan-1941  Vaginal brachytherapy procedure note   CC: Walker Kehr, MD  Everitt Amber, MD    ICD-9-CM ICD-10-CM   1. Endometrial cancer 182.0 C54.1     Diagnosis:   Endometrial cancer  (stage IA Grade 2)    Narrative:  The patient returns today for planning and her first high-dose-rate treatment directed at the vaginal cuff. . She reports soreness in her pelvic area that she has had since surgery. She reports having bright red vaginal spotting that started on Sunday.Over the past 24 hours really no further bleeding. She reports being much more active over the weekend.                           ALLERGIES:  has No Known Allergies.  Physical Findings: The patient is in no acute distress. Patient is alert and oriented.  height is 5\' 5"  (1.651 m) and weight is 176 lb 14.4 oz (80.241 kg). Her oral temperature is 98.5 F (36.9 C). Her blood pressure is 153/78 and her pulse is 78. Her respiration is 16. Marland Kitchen  On pelvic examination the vaginal cuff is well-healed. No bleeding noted in the vaginal vault.  On bimanual examination no pelvic masses appreciated. The vaginal cuff is intact with palpation.  The patient proceeded to undergo fitting for her vaginal cylinder. The optimal diameter to distend the vaginal vault without undue discomfort was a 3.0 cm cylinder. The patient tolerated the procedure well  Impression:  Successful fitting for her vaginal brachytherapy procedure  Plan:  Patient will be transferred to the [facility] for reinsertion of her custom cylinder and planning for her first high-dose-rate treatment.  ____________________________________ Gery Pray, MD

## 2015-06-01 NOTE — Progress Notes (Signed)
Sharon Holt here for HDR follow up.  She reports soreness in her pelvic area that she has had since surgery.  She reports having bright red vaginal spotting that started on Sunday.  She reports that she was more active over the weekend and went up 4 sections of stairs.  She denies urinary and bowel issues.  She reports having a good appetite.  BP 153/78 mmHg  Pulse 78  Temp(Src) 98.5 F (36.9 C) (Oral)  Resp 16  Ht 5\' 5"  (1.651 m)  Wt 176 lb 14.4 oz (80.241 kg)  BMI 29.44 kg/m2

## 2015-06-01 NOTE — Progress Notes (Signed)
  Radiation Oncology         [phone] ________________________________  Name: DIARRA CEJA MRN: [id number]  Date: 06/01/2015  DOB: 01/28/1941  SIMULATION AND TREATMENT PLANNING NOTE HDR BRACHYTHERAPY  DIAGNOSIS: The encounter diagnosis was Endometrial cancer. (Stage IA Grade 2)   NARRATIVE:  The patient was brought to the [facility].  Identity was confirmed.  All relevant records and images related to the planned course of therapy were reviewed.  The patient freely provided informed written consent to proceed with treatment after reviewing the details related to the planned course of therapy. The consent form was witnessed and verified by the simulation staff.  Then, the patient was set-up in a stable reproducible  supine position for radiation therapy.  CT images were obtained.  Surface markings were placed.  The CT images were loaded into the planning software.  Then the target and avoidance structures were contoured.  Treatment planning then occurred.  The radiation prescription was entered and confirmed.   I have requested : Brachytherapy Isodose Plan and Dosimetry Calculations to plan the radiation distribution.    PLAN:  The patient will receive 27.5 Gy in 5 fractions using iridium 192 as the high-dose-rate source. The patient will be treated with a 3 cm diameter cylinder with a treatment length of 3 cm. prescription will be to the vaginal mucosal surface   ________________________________  Blair Promise, PhD, MD

## 2015-06-01 NOTE — Progress Notes (Signed)
  Radiation Oncology         [phone] ________________________________  Name: MEYAH CORLE MRN: [id number]  Date: 06/01/2015  DOB: 1941-01-27   HDR BRACHYTHERAPY  DIAGNOSIS:  Endometrial cancer. (Stage IA Grade 2)    NARRATIVE:  The patient was brought to the HDR suite.  Identity was confirmed.  All relevant records and images related to the planned course of therapy were reviewed.  The patient freely provided informed written consent to proceed with treatment after reviewing the details related to the planned course of therapy. The consent form was witnessed and verified by the simulation staff.  Then, the patient was set-up in a stable reproducible  supine position for radiation therapy.  the patient's custom vaginal cylinder was placed in the proximal vagina. This was affixed to the CT/MR stabilization plate to prevent slippage. The patient tolerated the placement well.  Verification simulation note:  The patient had a fiducial marker placed within the vaginal cylinder. An AP and lateral film was obtained. This was compared to the patient's planning films showing accurate position of the vaginal cylinder for treatment.  High-dose-rate brachii therapy treatment  Remote afterloading catheter was affixed to the vaginal cylinder. The patient then proceeded to undergo her first high-dose-rate treatment directed at the proximal vagina. The patient was prescribed a dose of 5.5 Gy to be delivered to the vaginal mucosal surface. Iridium 192 was the high-dose-rate source .Treatment length was 3 cm. A 3 cm diameter cylinder was used to deliver the patient's treatment. Total treatment time was 212.7 seconds. The patient tolerated the treatment well. After completion of her therapy a radiation survey was performed documenting return of the iridium source into the gamma med safe .   ________________________________  Blair Promise, PhD, MD

## 2015-06-01 NOTE — Progress Notes (Signed)
  Radiation Oncology         [phone] ________________________________  Name: Sharon Holt MRN: [id number]  Date: 06/01/2015  DOB: 05/27/1941  Simple treatment device Note    ICD-9-CM ICD-10-CM   1. Endometrial cancer 182.0 C54.1     Status: outpatient  NARRATIVE: The patient had construction of her custom vaginal cylinder. She will be treated with a 3 cm segmented cylinder. This diameter cylinder distended the vaginal vault without undue discomfort.  -----------------------------------  Blair Promise, PhD, MD

## 2015-06-01 NOTE — Progress Notes (Signed)
Please see the Nurse Progress Note in the MD follow up encounter for this patient. 

## 2015-06-03 ENCOUNTER — Encounter: Payer: Self-pay | Admitting: Radiation Oncology

## 2015-06-09 ENCOUNTER — Telehealth: Payer: Self-pay | Admitting: *Deleted

## 2015-06-09 NOTE — Telephone Encounter (Signed)
CALLED PATIENT TO REMIND OF HDR Sharon Holt 06-10-15 @ 2 PM, SPOKE WITH PATIENT AND SHE IS AWARE OF THIS [place].

## 2015-06-10 ENCOUNTER — Ambulatory Visit
Admission: RE | Admit: 2015-06-10 | Discharge: 2015-06-10 | Disposition: A | Discharge status: Home / Self Care | Payer: Commercial Managed Care - HMO | Source: Ambulatory Visit | Attending: Radiation Oncology | Admitting: Radiation Oncology

## 2015-06-10 DIAGNOSIS — C541 Malignant neoplasm of endometrium: Secondary | ICD-10-CM

## 2015-06-10 DIAGNOSIS — Z510 Encounter for antineoplastic radiation therapy: Secondary | ICD-10-CM | POA: Diagnosis not present

## 2015-06-10 NOTE — Progress Notes (Signed)
  Radiation Oncology         [phone] ________________________________  Name: Sharon Holt MRN: [id number]  Date: 06/10/2015  DOB: 19-Jun-1941   HDR BRACHYTHERAPY  DIAGNOSIS: [name] is a 74 year old female presenting to clinic in regards to her endometrial cancer (Stage IA Grade 2).   Simple treatment device Note  NARRATIVE: The patient had construction of her custom vaginal cylinder. She will be treated with a 3 cm segmented cylinder. This diameter cylinder distended the vaginal vault without undue discomfort.  Vaginal brachytherapy procedure note The patient was brought to the [facility] suite.  Identity was confirmed.  All relevant records and images related to the planned course of therapy were reviewed. The patient freely provided informed written consent to proceed with treatment after reviewing the details related to the planned course of therapy. The consent form was witnessed and verified by the simulation staff. Then, the patient was set-up in a stable reproducible  supine position for radiation therapy. The patient's custom vaginal cylinder was placed in the proximal vagina. This was affixed to the CT/MR stabilization plate to prevent slippage. The patient tolerated the placement well.  Verification simulation note:  The patient had a fiducial marker placed within the vaginal cylinder. An AP and lateral film was obtained. This was compared to the patient's planning films showing accurate position of the vaginal cylinder for treatment.  High-dose-rate brachytherapy treatment  Remote afterloading catheter was affixed to the vaginal cylinder. The patient then proceeded to undergo her second high-dose-rate treatment directed at the proximal vagina. The patient was prescribed a dose of 5.5 Gy to be delivered to the vaginal mucosal surface. Iridium 192 was the high-dose-rate source. Treatment length was 3 cm. A 3 cm diameter cylinder was used to deliver the patient's  treatment. Total treatment time was 231.4 seconds. The patient tolerated the treatment well. After completion of her therapy a radiation survey was performed documenting return of the iridium source into the gamma med safe.  All questions and concerns vocalized have been fully addressed. The patient has been advised that is she develops any further questions or concerns in regards to her treatment, she has been encouraged to contact Dr. Sondra Come, MD, PhD.   This document serves as a record of services personally performed by Gery Pray, MD. It was created on his behalf by Lenn Cal, a trained medical scribe. The creation of this record is based on the scribe's personal observations and the provider's statements to them. This document has been checked and approved by the attending provider.    ________________________________  Blair Promise, PhD, MD

## 2015-06-14 ENCOUNTER — Telehealth: Payer: Self-pay | Admitting: Genetic Counselor

## 2015-06-14 NOTE — Telephone Encounter (Signed)
Followed up with Ms. Yazdani regarding her endometrial tumor test results.  At her genetic counseling appt, Ms. Pendley was not sure that she wanted any genetic testing so we made a plan to find out whether she had tumor testing and what the results were.  If she had not had tumor testing at the time, we discussed that we could add that on for her and that results of that test could give [place] a better idea of whether or not genetic testing was indicated.  Immunohistochemistry tumor test results are available in Ms. Gavin's record and were normal.  I cannot find a record of microsatellite instability testing and reached out to Dr. Serita Grit office about these.  I have not yet heard back from them, but I discussed the normal IHC results with Ms. Hoppel.  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  If Ms. Schmuck is still concerned, I can call look more into getting these MSI results and call her back with those.  She reported that she had not thought about these results since we last talked and that she is not worried about it, but is leaving it "in God's hands".  I told her that if she is not worried about any additional tumor testing results, then I am not worried about them either.  She is welcome to call me if she changes her mind or if she has any additional questions.

## 2015-06-16 ENCOUNTER — Telehealth: Payer: Self-pay | Admitting: *Deleted

## 2015-06-16 NOTE — Telephone Encounter (Signed)
Called patient to remind of HDR Tx. For 06-17-15 @ 2 pm, spoke with patient and she is aware of this tx.

## 2015-06-17 ENCOUNTER — Ambulatory Visit
Admission: RE | Admit: 2015-06-17 | Discharge: 2015-06-17 | Disposition: A | Discharge status: Home / Self Care | Payer: Commercial Managed Care - HMO | Source: Ambulatory Visit | Attending: Radiation Oncology | Admitting: Radiation Oncology

## 2015-06-17 DIAGNOSIS — Z510 Encounter for antineoplastic radiation therapy: Secondary | ICD-10-CM | POA: Diagnosis not present

## 2015-06-17 DIAGNOSIS — C541 Malignant neoplasm of endometrium: Secondary | ICD-10-CM

## 2015-06-17 NOTE — Progress Notes (Signed)
  Radiation Oncology         [phone] ________________________________  Name: Sharon Holt MRN: [id number]  Date: 06/17/2015  DOB: 1941-04-27   HDR BRACHYTHERAPY  DIAGNOSIS:  Endometrial cancer (Stage IA Grade 2)  Simple treatment device Note  NARRATIVE: The patient had construction of her custom vaginal cylinder. She will be treated with a 3 cm segmented cylinder. This diameter cylinder distended the vaginal vault without undue discomfort.   Vaginal brachytherapy procedure note: The patient was brought to the [place] suite.  Identity was confirmed.  All relevant records and images related to the planned course of therapy were reviewed.  The patient freely provided informed written consent to proceed with treatment after reviewing the details related to the planned course of therapy. The consent form was witnessed and verified by the simulation staff.  Then, the patient was set-up in a stable reproducible  supine position for radiation therapy.  the patient's custom vaginal cylinder was placed in the proximal vagina. This was affixed to the CT/MR stabilization plate to prevent slippage. The patient tolerated the placement well.  Verification simulation note:  The patient had a fiducial marker placed within the vaginal cylinder. An AP and lateral film was obtained. This was compared to the patient's planning films showing accurate position of the vaginal cylinder for treatment.  High-dose-rate brachii therapy treatment  Remote afterloading catheter was affixed to the vaginal cylinder. The patient then proceeded to undergo her third high-dose-rate treatment directed at the proximal vagina. The patient was prescribed a dose of 5.5 Gy to be delivered to the vaginal mucosal surface. Iridium 192 was the high-dose-rate source. Treatment length was 3 cm. A 3 cm diameter cylinder was used to deliver the patient's treatment. Total treatment time was 247.1 seconds. The patient tolerated the  treatment well. After completion of her therapy a radiation survey was performed documenting return of the iridium source into the gamma med safe.   This document serves as a record of services personally performed by Gery Pray, MD. It was created on his behalf by Lenn Cal, a trained medical scribe. The creation of this record is based on the scribe's personal observations and the provider's statements to them. This document has been checked and approved by the attending provider. ______________________________  Blair Promise, PhD, MD

## 2015-06-23 ENCOUNTER — Telehealth: Payer: Self-pay | Admitting: *Deleted

## 2015-06-23 NOTE — Telephone Encounter (Signed)
CALLED PATIENT TO REMIND OF HDR [place] 06-24-15 @ 10AM, SPOKE WITH PATIENT'S HUSBAND, JOE AND HE IS AWARE OF THIS TX.

## 2015-06-24 ENCOUNTER — Ambulatory Visit
Admission: RE | Admit: 2015-06-24 | Discharge: 2015-06-24 | Disposition: A | Discharge status: Home / Self Care | Payer: Commercial Managed Care - HMO | Source: Ambulatory Visit | Attending: Radiation Oncology | Admitting: Radiation Oncology

## 2015-06-24 DIAGNOSIS — C541 Malignant neoplasm of endometrium: Secondary | ICD-10-CM

## 2015-06-24 DIAGNOSIS — Z510 Encounter for antineoplastic radiation therapy: Secondary | ICD-10-CM | POA: Diagnosis not present

## 2015-06-24 NOTE — Progress Notes (Signed)
  Radiation Oncology         [phone] ________________________________  Name: Sharon Holt MRN: [id number]  Date: 06/24/2015  DOB: 10/17/1940   HDR BRACHYTHERAPY  DIAGNOSIS:  Endometrial cancer (Stage IA Grade 2)  Simple treatment device Note  NARRATIVE: The patient had construction of her custom vaginal cylinder. She will be treated with a 3 cm segmented cylinder. This diameter cylinder distended the vaginal vault without undue discomfort.   Vaginal brachytherapy procedure note: The patient was brought to the [place] suite.  Identity was confirmed.  All relevant records and images related to the planned course of therapy were reviewed.  The patient freely provided informed written consent to proceed with treatment after reviewing the details related to the planned course of therapy. The consent form was witnessed and verified by the simulation staff.  Then, the patient was set-up in a stable reproducible  supine position for radiation therapy.  the patient's custom vaginal cylinder was placed in the proximal vagina. This was affixed to the CT/MR stabilization plate to prevent slippage. The patient tolerated the placement well.  Verification simulation note:  The patient had a fiducial marker placed within the vaginal cylinder. An AP and lateral film was obtained. This was compared to the patient's planning films showing accurate position of the vaginal cylinder for treatment.  High-dose-rate brachytherapy treatment  Remote afterloading catheter was affixed to the vaginal cylinder. The patient then proceeded to undergo her fourth high-dose-rate treatment directed at the proximal vagina. The patient was prescribed a dose of 5.5 Gy to be delivered to the vaginal mucosal surface. Iridium 192 was the high-dose-rate source. Treatment length was 3 cm. A 3 cm diameter cylinder was used to deliver the patient's treatment. Total treatment time was 264 seconds. The patient tolerated the treatment  well. After completion of her therapy a radiation survey was performed documenting return of the iridium source into the gamma med safe.  This document serves as a record of services personally performed by Gery Pray, MD. It was created on his behalf by Darcus Austin, a trained medical scribe. The creation of this record is based on the scribe's personal observations and the provider's statements to them. This document has been checked and approved by the attending provider.  ______________________________  Blair Promise, PhD, MD

## 2015-07-01 ENCOUNTER — Encounter: Payer: Self-pay | Admitting: Radiation Oncology

## 2015-07-01 ENCOUNTER — Ambulatory Visit
Admission: RE | Admit: 2015-07-01 | Discharge: 2015-07-01 | Disposition: A | Discharge status: Home / Self Care | Payer: Commercial Managed Care - HMO | Source: Ambulatory Visit | Attending: Radiation Oncology | Admitting: Radiation Oncology

## 2015-07-01 DIAGNOSIS — Z510 Encounter for antineoplastic radiation therapy: Secondary | ICD-10-CM | POA: Diagnosis not present

## 2015-07-01 DIAGNOSIS — C541 Malignant neoplasm of endometrium: Secondary | ICD-10-CM

## 2015-07-01 NOTE — Progress Notes (Signed)
  Radiation Oncology         [phone] ________________________________  Name: Sharon Holt MRN: [id number]  Date: 07/01/2015  DOB: [birth year]   HDR BRACHYTHERAPY  DIAGNOSIS:  Endometrial cancer (Stage IA Grade 2)  Simple treatment device Note  NARRATIVE: The patient had construction of her custom vaginal cylinder. She will be treated with a 3 cm segmented cylinder. This diameter cylinder distended the vaginal vault without undue discomfort.   Vaginal brachytherapy procedure note: The patient was brought to the [place] suite.  Identity was confirmed.  All relevant records and images related to the planned course of therapy were reviewed.  The patient freely provided informed written consent to proceed with treatment after reviewing the details related to the planned course of therapy. The consent form was witnessed and verified by the simulation staff.  Then, the patient was set-up in a stable reproducible  supine position for radiation therapy.  the patient's custom vaginal cylinder was placed in the proximal vagina. This was affixed to the CT/MR stabilization plate to prevent slippage. The patient tolerated the placement well.  Verification simulation note:  The patient had a fiducial marker placed within the vaginal cylinder. An AP and lateral film was obtained. This was compared to the patient's planning films showing accurate position of the vaginal cylinder for treatment.  High-dose-rate brachytherapy treatment  Remote afterloading catheter was affixed to the vaginal cylinder. The patient then proceeded to undergo her fifth high-dose-rate treatment directed at the proximal vagina. The patient was prescribed a dose of 5.5 Gy to be delivered to the vaginal mucosal surface. Iridium 192 was the high-dose-rate source. Treatment length was 3 cm. A 3 cm diameter cylinder was used to deliver the patient's treatment. Total treatment time was 282 seconds. The patient tolerated the treatment  well. After completion of her therapy a radiation survey was performed documenting return of the iridium source into the gamma med safe.  This document serves as a record of services personally performed by Gery Pray, MD. It was created on his behalf by Darcus Austin, a trained medical scribe. The creation of this record is based on the scribe's personal observations and the provider's statements to them. This document has been checked and approved by the attending provider.  ______________________________  Blair Promise, PhD, MD

## 2015-07-18 NOTE — Progress Notes (Signed)
  Radiation Oncology         [phone] ________________________________  Name: Sharon Holt MRN: [id number]  Date: 07/01/2015  DOB: [birth year]  End of Treatment Note  DIAGNOSIS: The encounter diagnosis was Endometrial cancer. (Stage IA Grade 2)     Indication for treatment:  Risk for vaginal cuff recurrence       Radiation treatment dates:   06/01/2015, 06/10/2015, 06/17/2015, 9/82016, 07/01/2015  Site/dose:   Vaginal cuff 27.5 gray in 5 fractions (5.5 gray per fraction)  Beams/energy:   Intracavitary brachytherapy treatments using iridium 192 as the high-dose-rate source a 3 cm treatment length was delivered with a 3 cm diameter cylinder. Prescription was to the mucosal surface.  Narrative: The patient tolerated radiation treatment relatively well.   Minimal urinary symptoms and discomfort within the distal vagina.  Plan: The patient has completed radiation treatment. The patient will return to radiation oncology clinic for routine followup in one month. I advised them to call or return sooner if they have any questions or concerns related to their recovery or treatment.  -----------------------------------  Blair Promise, PhD, MD

## 2015-07-20 ENCOUNTER — Encounter (HOSPITAL_COMMUNITY): Payer: Self-pay

## 2015-08-02 ENCOUNTER — Ambulatory Visit: Payer: Commercial Managed Care - HMO | Admitting: Internal Medicine

## 2015-08-09 ENCOUNTER — Ambulatory Visit (INDEPENDENT_AMBULATORY_CARE_PROVIDER_SITE_OTHER): Payer: Commercial Managed Care - HMO | Admitting: Internal Medicine

## 2015-08-09 ENCOUNTER — Encounter: Payer: Self-pay | Admitting: Internal Medicine

## 2015-08-09 ENCOUNTER — Other Ambulatory Visit (INDEPENDENT_AMBULATORY_CARE_PROVIDER_SITE_OTHER): Payer: Commercial Managed Care - HMO

## 2015-08-09 VITALS — BP 120/78 | HR 107 | Wt 180.0 lb

## 2015-08-09 DIAGNOSIS — K9189 Other postprocedural complications and disorders of digestive system: Secondary | ICD-10-CM

## 2015-08-09 DIAGNOSIS — E785 Hyperlipidemia, unspecified: Secondary | ICD-10-CM

## 2015-08-09 DIAGNOSIS — I10 Essential (primary) hypertension: Secondary | ICD-10-CM

## 2015-08-09 DIAGNOSIS — C541 Malignant neoplasm of endometrium: Secondary | ICD-10-CM | POA: Diagnosis not present

## 2015-08-09 DIAGNOSIS — Z23 Encounter for immunization: Secondary | ICD-10-CM

## 2015-08-09 DIAGNOSIS — Z9049 Acquired absence of other specified parts of digestive tract: Principal | ICD-10-CM

## 2015-08-09 DIAGNOSIS — R197 Diarrhea, unspecified: Secondary | ICD-10-CM

## 2015-08-09 LAB — BASIC METABOLIC PANEL
BUN: 14 mg/dL (ref 6–23)
CALCIUM: 9.4 mg/dL (ref 8.4–10.5)
CO2: 28 meq/L (ref 19–32)
Chloride: 100 mEq/L (ref 96–112)
Creatinine, Ser: 0.91 mg/dL (ref 0.40–1.20)
GFR: 77.81 mL/min (ref >60.00)
GLUCOSE: 141 mg/dL — AB (ref 70–99)
Potassium: 3.7 mEq/L (ref 3.5–5.1)
SODIUM: 137 meq/L (ref 135–145)

## 2015-08-09 LAB — CBC WITH DIFFERENTIAL/PLATELET
BASOS ABS: 0 10*3/uL (ref 0.0–0.1)
Basophils Relative: 0.4 % (ref 0.0–3.0)
Eosinophils Absolute: 0.1 10*3/uL (ref 0.0–0.7)
Eosinophils Relative: 2.4 % (ref 0.0–5.0)
HEMATOCRIT: 36.8 % (ref 36.0–46.0)
Hemoglobin: 12.2 g/dL (ref 12.0–15.0)
LYMPHS PCT: 19.3 % (ref 12.0–46.0)
Lymphs Abs: 1.1 10*3/uL (ref 0.7–4.0)
MCHC: 33.2 g/dL (ref 30.0–36.0)
MCV: 91.6 fl (ref 78.0–100.0)
MONOS PCT: 9.1 % (ref 3.0–12.0)
Monocytes Absolute: 0.5 10*3/uL (ref 0.1–1.0)
Neutro Abs: 4 10*3/uL (ref 1.4–7.7)
Neutrophils Relative %: 68.8 % (ref 43.0–77.0)
Platelets: 275 10*3/uL (ref 150.0–400.0)
RBC: 4.02 Mil/uL (ref 3.87–5.11)
RDW: 13.2 % (ref 11.5–15.5)
WBC: 5.8 10*3/uL (ref 4.0–10.5)

## 2015-08-09 LAB — HEPATIC FUNCTION PANEL
ALK PHOS: 60 U/L (ref 39–117)
ALT: 16 U/L (ref 0–35)
AST: 31 U/L (ref 0–37)
Albumin: 3.7 g/dL (ref 3.5–5.2)
Bilirubin, Direct: 0.1 mg/dL (ref 0.0–0.3)
Total Bilirubin: 0.5 mg/dL (ref 0.2–1.2)
Total Protein: 7.4 g/dL (ref 6.0–8.3)

## 2015-08-09 LAB — URINALYSIS, ROUTINE W REFLEX MICROSCOPIC
Bilirubin Urine: NEGATIVE
Hgb urine dipstick: NEGATIVE
Ketones, ur: NEGATIVE
NITRITE: NEGATIVE
PH: 5.5 (ref 5.0–8.0)
RBC / HPF: NONE SEEN (ref 0–?)
Specific Gravity, Urine: 1.025 (ref 1.000–1.030)
Total Protein, Urine: NEGATIVE
Urine Glucose: NEGATIVE
Urobilinogen, UA: 0.2 (ref 0.0–1.0)

## 2015-08-09 LAB — IBC PANEL
Iron: 85 ug/dL (ref 42–145)
Saturation Ratios: 27.7 % (ref 20.0–50.0)
Transferrin: 219 mg/dL (ref 212.0–360.0)

## 2015-08-09 LAB — TSH: TSH: 1.35 u[IU]/mL (ref 0.35–4.50)

## 2015-08-09 MED ORDER — COLESEVELAM HCL 625 MG PO TABS: 1875.0000 mg | ORAL_TABLET | Freq: Two times a day (BID) | ORAL | Status: DC

## 2015-08-09 MED ORDER — LOPERAMIDE HCL 2 MG PO TABS: 2.0000 mg | ORAL_TABLET | Freq: Four times a day (QID) | ORAL | Status: DC | PRN

## 2015-08-09 MED ORDER — CLORAZEPATE DIPOTASSIUM 7.5 MG PO TABS: 7.5000 mg | ORAL_TABLET | Freq: Two times a day (BID) | ORAL | Status: DC | PRN

## 2015-08-09 NOTE — Progress Notes (Signed)
Subjective:  Patient ID: Sharon Holt, female    DOB: [birth year]  Age: 74 y.o. MRN: [id number]  CC: No chief complaint on file.   HPI GERLINE RATTO presents for endometr ca -  S/p surgery and XRT; HTN, anemia f/u. C/o diarrhea since GB is out when eating out and at home  Outpatient Prescriptions Prior to Visit  Medication Sig Dispense Refill  . atenolol (TENORMIN) 50 MG tablet TAKE 1 TABLET DAILY 90 tablet 3  . Cholecalciferol (VITAMIN D3) 2000 UNITS capsule Take 4,000 Units by mouth 2 (two) times a week.     Marland Kitchen ibuprofen (ADVIL,MOTRIN) 800 MG tablet Take 1 tablet (800 mg total) by mouth every 8 (eight) hours as needed (mild pain). 30 tablet 0  . irbesartan-hydrochlorothiazide (AVALIDE) 300-12.5 MG per tablet take 1 tablet by mouth once daily 30 tablet 5  . Multiple Vitamins-Minerals (CENTRUM SILVER PO) Take 1 capsule by mouth 2 (two) times a week.     . potassium chloride SA (K-DUR,KLOR-CON) 20 MEQ tablet Take 1 tablet (20 mEq total) by mouth daily. 90 tablet 3  . predniSONE (DELTASONE) 5 MG tablet Take 0.5 tablets (2.5 mg total) by mouth daily. 45 tablet 5  . raloxifene (EVISTA) 60 MG tablet Take 60 mg by mouth every morning.     . clorazepate (TRANXENE) 7.5 MG tablet Take 1 tablet (7.5 mg total) by mouth 2 (two) times daily as needed for anxiety. 180 tablet 1  . fish oil-omega-3 fatty acids 1000 MG capsule Take 1 g by mouth 2 (two) times a week.     . Garlic Oil 1027 MG CAPS Take 1 capsule by mouth 2 (two) times a week.      No facility-administered medications prior to visit.    ROS Review of Systems  Constitutional: Positive for fatigue. Negative for chills, activity change, appetite change and unexpected weight change.  HENT: Negative for congestion, mouth sores and sinus pressure.   Eyes: Negative for visual disturbance.  Respiratory: Negative for cough and chest tightness.   Gastrointestinal: Positive for diarrhea. Negative for nausea and abdominal pain.    Genitourinary: Negative for frequency, difficulty urinating and vaginal pain.  Musculoskeletal: Negative for back pain and gait problem.  Skin: Negative for pallor and rash.  Neurological: Negative for dizziness, tremors, weakness, numbness and headaches.  Psychiatric/Behavioral: Negative for confusion and sleep disturbance.    Objective:  BP 120/78 mmHg  Pulse 107  Wt 180 lb (81.647 kg)  SpO2 95%  BP Readings from Last 3 Encounters:  08/09/15 120/78  06/01/15 153/78  05/26/15 128/113    Wt Readings from Last 3 Encounters:  08/09/15 180 lb (81.647 kg)  06/01/15 176 lb 14.4 oz (80.241 kg)  05/26/15 177 lb 4.8 oz (80.423 kg)    Physical Exam  Constitutional: She appears well-developed. No distress.  HENT:  Head: Normocephalic.  Right Ear: External ear normal.  Left Ear: External ear normal.  Nose: Nose normal.  Mouth/Throat: Oropharynx is clear and moist.  Eyes: Conjunctivae are normal. Pupils are equal, round, and reactive to light. Right eye exhibits no discharge. Left eye exhibits no discharge.  Neck: Normal range of motion. Neck supple. No JVD present. No tracheal deviation present. No thyromegaly present.  Cardiovascular: Normal rate, regular rhythm and normal heart sounds.   Pulmonary/Chest: No stridor. No respiratory distress. She has no wheezes.  Abdominal: Soft. Bowel sounds are normal. She exhibits no distension and no mass. There is no tenderness. There is no rebound and  no guarding.  Musculoskeletal: She exhibits no edema or tenderness.  Lymphadenopathy:    She has no cervical adenopathy.  Neurological: She displays normal reflexes. No cranial nerve deficit. She exhibits normal muscle tone. Coordination normal.  Skin: No rash noted. No erythema.  Psychiatric: She has a normal mood and affect. Her behavior is normal. Judgment and thought content normal.  anxious today  Lab Results  Component Value Date   WBC 9.0 04/21/2015   HGB 11.6* 04/21/2015   HCT  35.4* 04/21/2015   PLT 211 04/21/2015   GLUCOSE 188* 04/21/2015   CHOL 185 09/25/2014   TRIG 86.0 09/25/2014   HDL 63.70 09/25/2014   LDLDIRECT 100.6 02/13/2012   LDLCALC 104* 09/25/2014   ALT 20 04/13/2015   AST 34 04/13/2015   NA 137 04/21/2015   K 4.0 04/21/2015   CL 104 04/21/2015   CREATININE 0.93 04/21/2015   BUN 9 04/21/2015   CO2 25 04/21/2015   TSH 1.25 09/25/2014   HGBA1C 5.4 02/13/2013    No results found.  Assessment & Plan:   Diagnoses and all orders for this visit:  Postcholecystectomy diarrhea -     TSH; Future -     CBC with Differential/Platelet; Future -     Hepatic function panel; Future -     IBC panel; Future -     Urinalysis; Future -     Basic metabolic panel; Future  Dyslipidemia -     TSH; Future -     CBC with Differential/Platelet; Future -     Hepatic function panel; Future -     IBC panel; Future -     Urinalysis; Future -     Basic metabolic panel; Future  Endometrial cancer (HCC) -     TSH; Future -     CBC with Differential/Platelet; Future -     Hepatic function panel; Future -     IBC panel; Future -     Urinalysis; Future -     Basic metabolic panel; Future  Essential hypertension -     TSH; Future -     CBC with Differential/Platelet; Future -     Hepatic function panel; Future -     IBC panel; Future -     Urinalysis; Future -     Basic metabolic panel; Future  Need for influenza vaccination -     Flu Vaccine QUAD 36+ mos IM  Other orders -     clorazepate (TRANXENE) 7.5 MG tablet; Take 1 tablet (7.5 mg total) by mouth 2 (two) times daily as needed for anxiety. -     loperamide (IMODIUM A-D) 2 MG tablet; Take 1-2 tablets (2-4 mg total) by mouth 4 (four) times daily as needed for diarrhea or loose stools (take 1 hr prior to eating out as needed). -     colesevelam (WELCHOL) 625 MG tablet; Take 3 tablets (1,875 mg total) by mouth 2 (two) times daily with a meal.   I have discontinued Ms. Polasek's fish oil-omega-3  fatty acids and Garlic Oil. I am also having her start on loperamide and colesevelam. Additionally, I am having her maintain her Multiple Vitamins-Minerals (CENTRUM SILVER PO), raloxifene, Vitamin D3, predniSONE, atenolol, potassium chloride SA, irbesartan-hydrochlorothiazide, ibuprofen, and clorazepate.  Meds ordered this encounter  Medications  . clorazepate (TRANXENE) 7.5 MG tablet    Sig: Take 1 tablet (7.5 mg total) by mouth 2 (two) times daily as needed for anxiety.    Dispense:  180 tablet  Refill:  1  . loperamide (IMODIUM A-D) 2 MG tablet    Sig: Take 1-2 tablets (2-4 mg total) by mouth 4 (four) times daily as needed for diarrhea or loose stools (take 1 hr prior to eating out as needed).    Dispense:  30 tablet    Refill:  0  . colesevelam (WELCHOL) 625 MG tablet    Sig: Take 3 tablets (1,875 mg total) by mouth 2 (two) times daily with a meal.    Dispense:  180 tablet    Refill:  11     Follow-up: Return in about 3 months (around 11/09/2015) for a follow-up visit.  Walker Kehr, MD

## 2015-08-09 NOTE — Progress Notes (Signed)
Pre visit review using our clinic review tool, if applicable. No additional management support is needed unless otherwise documented below in the visit note. 

## 2015-08-09 NOTE — Patient Instructions (Signed)
Take Welchol 1-4 tabs a day

## 2015-08-09 NOTE — Assessment & Plan Note (Signed)
2016 surgery and XRT

## 2015-08-09 NOTE — Assessment & Plan Note (Signed)
Chronic  Avalide, Atenolol

## 2015-08-09 NOTE — Assessment & Plan Note (Signed)
Chronic 10/16 Welchol

## 2015-08-09 NOTE — Assessment & Plan Note (Signed)
Chronic 10/16 Welchol and prn Imodium

## 2015-08-10 ENCOUNTER — Encounter: Payer: Self-pay | Admitting: Oncology

## 2015-08-11 ENCOUNTER — Encounter: Payer: Self-pay | Admitting: Radiation Oncology

## 2015-08-11 ENCOUNTER — Ambulatory Visit
Admission: RE | Admit: 2015-08-11 | Discharge: 2015-08-11 | Disposition: A | Discharge status: Home / Self Care | Payer: Commercial Managed Care - HMO | Source: Ambulatory Visit | Attending: Radiation Oncology | Admitting: Radiation Oncology

## 2015-08-11 VITALS — BP 148/87 | HR 81 | Temp 98.5°F | Resp 18 | Ht 65.0 in | Wt 182.8 lb

## 2015-08-11 DIAGNOSIS — C541 Malignant neoplasm of endometrium: Secondary | ICD-10-CM

## 2015-08-11 NOTE — Progress Notes (Signed)
Gave Sharon Holt a size M vaginal dilator and surgilube.  Educated her to apply surgilube to the dilator and to insert in for 10 minutes 3 times a week (Monday, Wednesday, Friday) and wash with soap and water after use.  Sharon Holt verbalized understanding in teach back mode.

## 2015-08-11 NOTE — Progress Notes (Signed)
Radiation Oncology         [phone] ________________________________  Name: Sharon Holt MRN: [id number]  Date: 08/11/2015  DOB: [birth year]  Follow-Up Visit Note  CC: Sharon Kehr, MD  Sharon Amber, MD    ICD-9-CM ICD-10-CM   1. Endometrial cancer (HCC) 182.0 C54.1     Diagnosis: Endometrial cancer. (Stage IA Grade 2)   Interval Since Last Radiation:  1  months, 06/01/15- 07/01/15  Indication for treatment:  Risk for vaginal cuff recurrence       Radiation treatment dates: 06/01/2015, 06/10/2015, 06/17/2015, 9/82016, 07/01/2015  Site/dose: Vaginal cuff 27.5 gray in 5 fractions (5.5 gray per fraction)  Narrative:   Sharon Holt here for follow up. She continues to report occasional pain in her left pelvic area. She reports feeling pressure when she finishes urinating. She continues to have diarrhea which she says has been a problem since her gallbladder was removed. She has started taking Imodium. She denies having vaginal/rectal bleeding or discharge. She reports her appetite is good. She reports occasional fatigue. She notes occasional muscle pain in the lower abdomen. She is planning to go back to work soon.            ALLERGIES:  has No Known Allergies.  Meds: Current Outpatient Prescriptions  Medication Sig Dispense Refill  . atenolol (TENORMIN) 50 MG tablet TAKE 1 TABLET DAILY 90 tablet 3  . Cholecalciferol (VITAMIN D3) 2000 UNITS capsule Take 4,000 Units by mouth 2 (two) times a week.     . clorazepate (TRANXENE) 7.5 MG tablet Take 1 tablet (7.5 mg total) by mouth 2 (two) times daily as needed for anxiety. 180 tablet 1  . colesevelam (WELCHOL) 625 MG tablet Take 3 tablets (1,875 mg total) by mouth 2 (two) times daily with a meal. 180 tablet 11  . ibuprofen (ADVIL,MOTRIN) 800 MG tablet Take 1 tablet (800 mg total) by mouth every 8 (eight) hours as needed (mild pain). 30 tablet 0  . irbesartan-hydrochlorothiazide (AVALIDE) 300-12.5 MG per tablet take  1 tablet by mouth once daily 30 tablet 5  . loperamide (IMODIUM A-D) 2 MG tablet Take 1-2 tablets (2-4 mg total) by mouth 4 (four) times daily as needed for diarrhea or loose stools (take 1 hr prior to eating out as needed). 30 tablet 0  . Multiple Vitamins-Minerals (CENTRUM SILVER PO) Take 1 capsule by mouth 2 (two) times a week.     . potassium chloride SA (K-DUR,KLOR-CON) 20 MEQ tablet Take 1 tablet (20 mEq total) by mouth daily. 90 tablet 3  . predniSONE (DELTASONE) 5 MG tablet Take 0.5 tablets (2.5 mg total) by mouth daily. 45 tablet 5  . raloxifene (EVISTA) 60 MG tablet Take 60 mg by mouth every morning.      No current facility-administered medications for this encounter.    Physical Findings: The patient is in no acute distress. Patient is alert and oriented.  height is _0  (1.651 m) and weight is 182 lb 12.8 oz (82.918 kg). Her oral temperature is 98.5 F (36.9 C). Her blood pressure is 148/87 and her pulse is 81. Her respiration is 18 and oxygen saturation is 100%. .  No significant changes. No palpable cervical, supraclavicular or axillary lymphoadenopathy. The heart has a regular rate and rhythm. The lungs are clear to auscultation. Pelvic exam not performed in light of recent completion of treatment   Lab Findings: Lab Results  Component Value Date   WBC 5.8 08/09/2015   HGB 12.2 08/09/2015  HCT 36.8 08/09/2015   MCV 91.6 08/09/2015   PLT 275.0 08/09/2015    Radiographic Findings: No results found.  Impression:  The patient is recovering from the effects of radiation.    Plan: She will follow up with Dr. Denman Holt in 2 months and follow up with radiation oncology in 5 months. Nursing met with patient about vaginal dilator with instructions and a dilator supplied for the patient.  -----------------------------------  Blair Promise, PhD, MD  This document serves as a record of services personally performed by Gery Pray, MD. It was created on his behalf by Derek Mound, a trained medical scribe. The creation of this record is based on the scribe's personal observations and the provider's statements to them. This document has been checked and approved by the attending provider.

## 2015-08-11 NOTE — Progress Notes (Signed)
Sharon Holt here for follow up.  She continues to report occasional pain in her left pelvic area.  She reports feeling pressure when she finishes urinating.  She continues to have diarrhea which she says has been a problem since her gallbladder was removed.  She has started taking Imodium.  She denies having vaginal/rectal bleeding or discharge.  She reports her appetite is good.  She reports occasional fatigue.  BP 148/87 mmHg  Pulse 81  Temp(Src) 98.5 F (36.9 C) (Oral)  Resp 18  Ht 5\' 5"  (1.651 m)  Wt 182 lb 12.8 oz (82.918 kg)  BMI 30.42 kg/m2  SpO2 100%

## 2015-08-26 ENCOUNTER — Ambulatory Visit (INDEPENDENT_AMBULATORY_CARE_PROVIDER_SITE_OTHER): Payer: Commercial Managed Care - HMO | Admitting: Family

## 2015-08-26 ENCOUNTER — Encounter: Payer: Self-pay | Admitting: Family

## 2015-08-26 VITALS — BP 122/82 | HR 78 | Temp 98.2°F | Resp 18 | Ht 65.0 in | Wt 189.0 lb

## 2015-08-26 DIAGNOSIS — K5900 Constipation, unspecified: Secondary | ICD-10-CM

## 2015-08-26 NOTE — Progress Notes (Signed)
Subjective:    Patient ID: Sharon Holt, female    DOB: 08-22-1941, 74 y.o.   MRN: [id number]  Chief Complaint  Patient presents with  . Bloated    states that her stomach was hard last night but has went to the bathroom and does feel some relief and does not have issues going usually     HPI:  Sharon Holt is a 74 y.o. 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 and presents today for an office visit.   Associated symptom of bloating and not being able to go the bathroom has been going on for a couple of days. Her appetite was also noted to be decreased and not to be feeling good. Modifying factors include eating a banana which has helped and skipping doses of the Welchol and Vitamin D to see if that would help. Reports bowel movement earlier which helped to reduce her symptoms and she is currently feeling better.   No Known Allergies   Current Outpatient Prescriptions on File Prior to Visit  Medication Sig Dispense Refill  . atenolol (TENORMIN) 50 MG tablet TAKE 1 TABLET DAILY 90 tablet 3  . Cholecalciferol (VITAMIN D3) 2000 UNITS capsule Take 4,000 Units by mouth 2 (two) times a week.     . clorazepate (TRANXENE) 7.5 MG tablet Take 1 tablet (7.5 mg total) by mouth 2 (two) times daily as needed for anxiety. 180 tablet 1  . colesevelam (WELCHOL) 625 MG tablet Take 3 tablets (1,875 mg total) by mouth 2 (two) times daily with a meal. 180 tablet 11  . ibuprofen (ADVIL,MOTRIN) 800 MG tablet Take 1 tablet (800 mg total) by mouth every 8 (eight) hours as needed (mild pain). 30  tablet 0  . irbesartan-hydrochlorothiazide (AVALIDE) 300-12.5 MG per tablet take 1 tablet by mouth once daily 30 tablet 5  . loperamide (IMODIUM A-D) 2 MG tablet Take 1-2 tablets (2-4 mg total) by mouth 4 (four) times daily as needed for diarrhea or loose stools (take 1 hr prior to eating out as needed). 30 tablet 0  . Multiple Vitamins-Minerals (CENTRUM SILVER PO) Take 1 capsule by mouth 2 (two) times a week.     . potassium chloride SA (K-DUR,KLOR-CON) 20 MEQ tablet Take 1 tablet (20 mEq total) by mouth daily. 90 tablet 3  . predniSONE (DELTASONE) 5 MG tablet Take 0.5 tablets (2.5 mg total) by mouth daily. 45 tablet 5  . raloxifene (EVISTA) 60 MG tablet Take 60 mg by mouth every morning.      No current facility-administered medications on file prior to visit.     Review of Systems  Constitutional: Negative for fever and chills.  Gastrointestinal: Positive for constipation and abdominal distention. Negative for nausea, vomiting, abdominal pain, diarrhea, blood in stool and rectal pain.      Objective:    BP 122/82 mmHg  Pulse 78  Temp(Src) 98.2 F (36.8 C) (Oral)  Resp 18  Ht 5\' 5"  (1.651 m)  Wt 189 lb (85.73 kg)  BMI 31.45 kg/m2  SpO2 96% Nursing note and vital signs reviewed.  Physical Exam  Constitutional: She is oriented to person, place, and time. She appears well-developed and well-nourished. No distress.  Cardiovascular: Normal  rate, regular rhythm, normal heart sounds and intact distal pulses.   Pulmonary/Chest: Effort normal and breath sounds normal.  Abdominal: Soft. Normal appearance and bowel sounds are normal. She exhibits distension. There is no hepatosplenomegaly. There is no tenderness. There is no rigidity, no rebound, no guarding, no tenderness at McBurney's point and negative Murphy's sign.  Neurological: She is alert and oriented to person, place, and time.  Skin: Skin is warm and dry.  Psychiatric: She has a normal mood and affect. Her behavior is normal.  Judgment and thought content normal.       Assessment & Plan:   Problem List Items Addressed This Visit      Digestive   Constipation - Primary    Transient constipation resolved with alternative methods most likely related to starting Welchol as this is a well known side effect. 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 Hold Welchol and consider every other day dosage once bowel movements return to normal. Follow up if symptoms worsen or fail to improve.

## 2015-08-26 NOTE — Progress Notes (Signed)
Pre visit review using our clinic review tool, if applicable. No additional management support is needed unless otherwise documented below in the visit note. 

## 2015-08-26 NOTE — Patient Instructions (Signed)
Thank you for choosing [facility].  Summary/Instructions:  Your prescription(s) have been submitted to your pharmacy or been printed and provided for you. Please take as directed and contact our office if you believe you are having problem(s) with the medication(s) or have any questions.  If your symptoms worsen or fail to improve, please contact our office for further instruction, or in case of emergency go directly to the emergency room at the closest medical facility.   Constipation, Adult Constipation is when a person has fewer than three bowel movements a week, has difficulty having a bowel movement, or has stools that are dry, hard, or larger than normal. As people grow older, constipation is more common. A low-fiber diet, not taking in enough fluids, and taking certain medicines may make constipation worse.  CAUSES   Certain medicines, such as antidepressants, pain medicine, iron supplements, antacids, and water pills.   Certain diseases, such as diabetes, irritable bowel syndrome (IBS), thyroid disease, or depression.   Not drinking enough water.   Not eating enough fiber-rich foods.   Stress or travel.   Lack of physical activity or exercise.   Ignoring the urge to have a bowel movement.   Using laxatives too much.  SIGNS AND SYMPTOMS   Having fewer than three bowel movements a week.   Straining to have a bowel movement.   Having stools that are hard, dry, or larger than normal.   Feeling full or bloated.   Pain in the lower abdomen.   Not feeling relief after having a bowel movement.  DIAGNOSIS  Your health care provider will take a medical history and perform a physical exam. Further testing may be done for severe constipation. Some tests may include:  A barium enema X-ray to examine your rectum, colon, and, sometimes, your small intestine.   A sigmoidoscopy to examine your lower colon.   A colonoscopy to examine your entire  colon. TREATMENT  Treatment will depend on the severity of your constipation and what is causing it. Some dietary treatments include drinking more fluids and eating more fiber-rich foods. Lifestyle treatments may include regular exercise. If these diet and lifestyle recommendations do not help, your health care provider may recommend taking over-the-counter laxative medicines to help you have bowel movements. Prescription medicines may be prescribed if over-the-counter medicines do not work.  HOME CARE INSTRUCTIONS   Eat foods that have a lot of fiber, such as fruits, vegetables, whole grains, and beans.  Limit foods high in fat and processed sugars, such as french fries, hamburgers, cookies, candies, and soda.   A fiber supplement may be added to your diet if you cannot get enough fiber from foods.   Drink enough fluids to keep your urine clear or pale yellow.   Exercise regularly or as directed by your health care provider.   Go to the restroom when you have the urge to go. Do not hold it.   Only take over-the-counter or prescription medicines as directed by your health care provider. Do not take other medicines for constipation without talking to your health care provider first.  [place] IF:   You have bright red blood in your stool.   Your constipation lasts for more than 4 days or gets worse.   You have abdominal or rectal pain.   You have thin, pencil-like stools.   You have unexplained weight loss. MAKE SURE YOU:   Understand these instructions.  Will watch your condition.  Will get help right  away if you are not doing well or get worse.   This information is not intended to replace advice given to you by your health care provider. Make sure you discuss any questions you have with your health care provider.   Document Released: 06/30/2004 Document Revised: 10/23/2014 Document Reviewed: 07/14/2013 Elsevier Interactive Patient Education [employer]

## 2015-08-26 NOTE — Assessment & Plan Note (Signed)
Transient constipation resolved with alternative methods most likely related to starting Welchol as this is a well known side effect. 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 Hold Welchol and consider every other day dosage once bowel movements return to normal. Follow up if symptoms worsen or fail to improve.

## 2015-09-02 ENCOUNTER — Ambulatory Visit (INDEPENDENT_AMBULATORY_CARE_PROVIDER_SITE_OTHER): Payer: Commercial Managed Care - HMO | Admitting: Family

## 2015-09-02 ENCOUNTER — Telehealth: Payer: Self-pay | Admitting: Internal Medicine

## 2015-09-02 ENCOUNTER — Telehealth: Payer: Self-pay | Admitting: Family

## 2015-09-02 ENCOUNTER — Encounter: Payer: Self-pay | Admitting: Family

## 2015-09-02 ENCOUNTER — Ambulatory Visit (INDEPENDENT_AMBULATORY_CARE_PROVIDER_SITE_OTHER)
Admission: RE | Admit: 2015-09-02 | Discharge: 2015-09-02 | Disposition: A | Discharge status: Home / Self Care | Payer: Commercial Managed Care - HMO | Source: Ambulatory Visit | Attending: Family | Admitting: Family

## 2015-09-02 VITALS — BP 140/94 | HR 102 | Temp 98.1°F | Resp 18 | Ht 65.0 in | Wt 191.0 lb

## 2015-09-02 DIAGNOSIS — R140 Abdominal distension (gaseous): Secondary | ICD-10-CM | POA: Diagnosis not present

## 2015-09-02 MED ORDER — RANITIDINE HCL 150 MG PO CAPS: 150.0000 mg | ORAL_CAPSULE | Freq: Every evening | ORAL | Status: DC

## 2015-09-02 NOTE — Progress Notes (Signed)
Pre visit review using our clinic review tool, if applicable. No additional management support is needed unless otherwise documented below in the visit note. 

## 2015-09-02 NOTE — Telephone Encounter (Signed)
[place] [facility] - Client [facility]  Patient Name: Sharon Holt  DOB: 1941/07/16    Initial Comment Caller states she saw her doctor a few weeks ago. Her stomach is swelled and tight.   Nurse Assessment  Nurse: Kenton Kingfisher, RN, Meagan Date/Time (Eastern Time): 09/02/2015 10:37:18 AM  Confirm and document reason for call. If symptomatic, describe symptoms. ---Caller states she saw her doctor a few weeks ago. Her stomach is swelled and tight. Caller states she was seen last week as well and saw a NP and talked about how she has bowel issues when she goes out to eat due to no gallbladder and was put on a medication and noticed she started having issues with the medication and she stopped it and noticed she was having constipation. Caller states when she eats her stomach is now swelling. Caller states last BM was this morning and bowels moved well but still getting swelling in her stomach.  Has the patient traveled out of the country within the last 30 days? ---No  Does the patient have any new or worsening symptoms? ---Yes  Will a triage be completed? ---Yes  Related visit to physician within the last 2 weeks? ---Yes  Does the PT have any chronic conditions? (i.e. diabetes, asthma, etc.) ---Yes  List chronic conditions. 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 ---No     Guidelines    Guideline Title Affirmed Question Affirmed Notes  Abdominal Pain - Female [1] MODERATE (e.g., interferes with normal activities) AND [2] pain comes and goes (cramps) AND [3] present > 24 hours (Exception: pain with Vomiting or Diarrhea - see that Guideline)    Final Disposition User   See Physician within 24 Hours Harris, RN, Meagan    Comments  Pain/Discomfort from swelling 6/10   Referrals  REFERRED TO PCP OFFICE  [facility]    Disagree/Comply: Comply

## 2015-09-02 NOTE — Patient Instructions (Signed)
Thank you for choosing [facility].  Summary/Instructions:  Your prescription(s) have been submitted to your pharmacy or been printed and provided for you. Please take as directed and contact our office if you believe you are having problem(s) with the medication(s) or have any questions.  If your symptoms worsen or fail to improve, please contact our office for further instruction, or in case of emergency go directly to the emergency room at the closest medical facility.     

## 2015-09-02 NOTE — Assessment & Plan Note (Signed)
Symptoms of bloating after eating. Abdominal exam with mild distention otherwise benign. Obtain x-ray to rule out free air. Start ranitidine for possible GERD. Start simethicone or other anti-gas agent. Start probiotic. Follow up pending x-ray results or if symptoms worsen or fail to improve.

## 2015-09-02 NOTE — Telephone Encounter (Signed)
Spoke with patient on phone and informed her that her x-ray results are negative. Instructed to follow up if symptoms worsen or fail to improve with treatments discussed.

## 2015-09-02 NOTE — Progress Notes (Signed)
Subjective:    Patient ID: Sharon Holt, female    DOB: 03-03-1941, 74 y.o.   MRN: [id number]  Chief Complaint  Patient presents with  . Abdominal Pain    stomach bloated and this has been going on since taking welchol, not having regular BMs    HPI:  Sharon Holt is a 74 y.o. 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 and presents today for an acute office visit.  Associated symptom of stomach bloating and constipation have been going on for several weeks and was recently seen in the office the same. At the time it was believed to be Welchol causing her constipation. Has since discontinued the [employer] but is not having bowel movements like she used to. She notes that after she eats her stomach gets tight. Pepto-bismol does help with the symptoms. Frequency of bowel movements is daily. Aggravating factors include all foods.  No Known Allergies   Current Outpatient Prescriptions on File Prior to Visit  Medication Sig Dispense Refill  . atenolol (TENORMIN) 50 MG tablet TAKE 1 TABLET DAILY 90 tablet 3  . Cholecalciferol (VITAMIN D3) 2000 UNITS capsule Take 4,000 Units by mouth 2 (two) times a week.     . clorazepate (TRANXENE) 7.5 MG tablet Take 1 tablet (7.5 mg total) by mouth 2 (two) times daily as needed for anxiety. 180 tablet 1  . ibuprofen (ADVIL,MOTRIN) 800 MG tablet Take 1 tablet (800 mg total) by mouth every 8 (eight) hours as needed (mild pain). 30 tablet 0  . irbesartan-hydrochlorothiazide (AVALIDE) 300-12.5 MG per tablet take 1 tablet by mouth once daily 30 tablet 5    . loperamide (IMODIUM A-D) 2 MG tablet Take 1-2 tablets (2-4 mg total) by mouth 4 (four) times daily as needed for diarrhea or loose stools (take 1 hr prior to eating out as needed). 30 tablet 0  . Multiple Vitamins-Minerals (CENTRUM SILVER PO) Take 1 capsule by mouth 2 (two) times a week.     . potassium chloride SA (K-DUR,KLOR-CON) 20 MEQ tablet Take 1 tablet (20 mEq total) by mouth daily. 90 tablet 3  . predniSONE (DELTASONE) 5 MG tablet Take 0.5 tablets (2.5 mg total) by mouth daily. 45 tablet 5  . raloxifene (EVISTA) 60 MG tablet Take 60 mg by mouth every morning.      No current facility-administered medications on file prior to visit.    Review of Systems  Constitutional: Negative for fever and chills.  Respiratory: Negative for chest tightness and shortness of breath.   Cardiovascular: Negative for chest pain, palpitations and leg swelling.  Gastrointestinal: Positive for abdominal distention. Negative for nausea, vomiting, abdominal pain, diarrhea and constipation.     Objective:    BP 140/94 mmHg  Pulse 102  Temp(Src) 98.1 F (36.7 C) (Oral)  Resp 18  Ht 5\' 5"  (1.651 m)  Wt 191 lb (86.637 kg)  BMI 31.78 kg/m2  SpO2 99% Nursing note and vital signs reviewed.  Physical Exam  Constitutional: She is oriented to person, place, and time. She appears well-developed and well-nourished. No distress.  Cardiovascular: Normal rate, regular rhythm, normal heart sounds and intact distal pulses.   Pulmonary/Chest: Effort normal and breath sounds normal.  Abdominal:  Soft. Normal appearance and bowel sounds are normal. She exhibits distension. She exhibits no fluid wave, no ascites and no mass. There is no hepatosplenomegaly. There is no tenderness. There is no rigidity, no rebound, no guarding, no tenderness at McBurney's point and negative Murphy's sign.  Neurological: She is alert and oriented to person, place, and time.  Skin: Skin is warm and dry.  Psychiatric: She has a normal  mood and affect. Her behavior is normal. Judgment and thought content normal.       Assessment & Plan:   Problem List Items Addressed This Visit      Other   Bloating - Primary    Symptoms of bloating after eating. Abdominal exam with mild distention otherwise benign. Obtain x-ray to rule out free air. Start ranitidine for possible GERD. Start simethicone or other anti-gas agent. Start probiotic. Follow up pending x-ray results or if symptoms worsen or fail to improve.      Relevant Medications   ranitidine (ZANTAC) 150 MG capsule   Other Relevant Orders   DG Abd 2 Views

## 2015-09-06 ENCOUNTER — Observation Stay (HOSPITAL_COMMUNITY)
Admission: EM | Admit: 2015-09-06 | Discharge: 2015-09-07 | Disposition: A | Discharge status: Home / Self Care | Payer: Commercial Managed Care - HMO | Attending: Internal Medicine | Admitting: Internal Medicine

## 2015-09-06 ENCOUNTER — Encounter (HOSPITAL_COMMUNITY): Payer: Self-pay | Admitting: Emergency Medicine

## 2015-09-06 ENCOUNTER — Emergency Department (HOSPITAL_COMMUNITY): Payer: Commercial Managed Care - HMO

## 2015-09-06 ENCOUNTER — Other Ambulatory Visit: Payer: Self-pay | Admitting: Oncology

## 2015-09-06 DIAGNOSIS — E785 Hyperlipidemia, unspecified: Secondary | ICD-10-CM | POA: Diagnosis not present

## 2015-09-06 DIAGNOSIS — Z66 Do not resuscitate: Secondary | ICD-10-CM | POA: Insufficient documentation

## 2015-09-06 DIAGNOSIS — N179 Acute kidney failure, unspecified: Secondary | ICD-10-CM | POA: Diagnosis not present

## 2015-09-06 DIAGNOSIS — R180 Malignant ascites: Secondary | ICD-10-CM | POA: Diagnosis present

## 2015-09-06 DIAGNOSIS — C541 Malignant neoplasm of endometrium: Secondary | ICD-10-CM | POA: Diagnosis not present

## 2015-09-06 DIAGNOSIS — F419 Anxiety disorder, unspecified: Secondary | ICD-10-CM | POA: Diagnosis not present

## 2015-09-06 DIAGNOSIS — R109 Unspecified abdominal pain: Secondary | ICD-10-CM | POA: Diagnosis present

## 2015-09-06 DIAGNOSIS — K5900 Constipation, unspecified: Secondary | ICD-10-CM | POA: Insufficient documentation

## 2015-09-06 DIAGNOSIS — Z79899 Other long term (current) drug therapy: Secondary | ICD-10-CM | POA: Diagnosis not present

## 2015-09-06 DIAGNOSIS — R188 Other ascites: Secondary | ICD-10-CM | POA: Diagnosis not present

## 2015-09-06 DIAGNOSIS — C799 Secondary malignant neoplasm of unspecified site: Secondary | ICD-10-CM | POA: Diagnosis present

## 2015-09-06 DIAGNOSIS — Z923 Personal history of irradiation: Secondary | ICD-10-CM | POA: Diagnosis not present

## 2015-09-06 DIAGNOSIS — C786 Secondary malignant neoplasm of retroperitoneum and peritoneum: Secondary | ICD-10-CM | POA: Diagnosis present

## 2015-09-06 DIAGNOSIS — M810 Age-related osteoporosis without current pathological fracture: Secondary | ICD-10-CM | POA: Insufficient documentation

## 2015-09-06 DIAGNOSIS — R140 Abdominal distension (gaseous): Secondary | ICD-10-CM | POA: Diagnosis not present

## 2015-09-06 DIAGNOSIS — I10 Essential (primary) hypertension: Secondary | ICD-10-CM | POA: Diagnosis not present

## 2015-09-06 DIAGNOSIS — Z85048 Personal history of other malignant neoplasm of rectum, rectosigmoid junction, and anus: Secondary | ICD-10-CM | POA: Insufficient documentation

## 2015-09-06 DIAGNOSIS — Z90710 Acquired absence of both cervix and uterus: Secondary | ICD-10-CM | POA: Insufficient documentation

## 2015-09-06 DIAGNOSIS — C801 Malignant (primary) neoplasm, unspecified: Secondary | ICD-10-CM

## 2015-09-06 LAB — COMPREHENSIVE METABOLIC PANEL
ALT: 12 U/L — AB (ref 14–54)
AST: 28 U/L (ref 15–41)
Albumin: 2.9 g/dL — ABNORMAL LOW (ref 3.5–5.0)
Alkaline Phosphatase: 42 U/L (ref 38–126)
Anion gap: 9 (ref 5–15)
BUN: 11 mg/dL (ref 6–20)
CHLORIDE: 101 mmol/L (ref 101–111)
CO2: 24 mmol/L (ref 22–32)
CREATININE: 1.29 mg/dL — AB (ref 0.44–1.00)
Calcium: 8.9 mg/dL (ref 8.9–10.3)
GFR, EST AFRICAN AMERICAN: 46 mL/min — AB (ref >60)
GFR, EST NON AFRICAN AMERICAN: 40 mL/min — AB (ref >60)
Glucose, Bld: 151 mg/dL — ABNORMAL HIGH (ref 65–99)
POTASSIUM: 4.4 mmol/L (ref 3.5–5.1)
SODIUM: 134 mmol/L — AB (ref 135–145)
Total Bilirubin: 0.7 mg/dL (ref 0.3–1.2)
Total Protein: 6.8 g/dL (ref 6.5–8.1)

## 2015-09-06 LAB — URINE MICROSCOPIC-ADD ON

## 2015-09-06 LAB — URINALYSIS, ROUTINE W REFLEX MICROSCOPIC
Glucose, UA: NEGATIVE mg/dL
HGB URINE DIPSTICK: NEGATIVE
Ketones, ur: NEGATIVE mg/dL
Nitrite: NEGATIVE
PH: 5 (ref 5.0–8.0)
PROTEIN: 30 mg/dL — AB
Specific Gravity, Urine: 1.02 (ref 1.005–1.030)

## 2015-09-06 LAB — LIPASE, BLOOD: LIPASE: 36 U/L (ref 11–51)

## 2015-09-06 LAB — CBC
HEMATOCRIT: 36.2 % (ref 36.0–46.0)
Hemoglobin: 11.8 g/dL — ABNORMAL LOW (ref 12.0–15.0)
MCH: 29.8 pg (ref 26.0–34.0)
MCHC: 32.6 g/dL (ref 30.0–36.0)
MCV: 91.4 fL (ref 78.0–100.0)
PLATELETS: 314 10*3/uL (ref 150–400)
RBC: 3.96 MIL/uL (ref 3.87–5.11)
RDW: 12.2 % (ref 11.5–15.5)
WBC: 7 10*3/uL (ref 4.0–10.5)

## 2015-09-06 MED ORDER — ACETAMINOPHEN 325 MG PO TABS: 650.0000 mg | ORAL_TABLET | Freq: Four times a day (QID) | ORAL | Status: DC | PRN

## 2015-09-06 MED ORDER — ONDANSETRON HCL 4 MG/2ML IJ SOLN: 4.0000 mg | Freq: Four times a day (QID) | INTRAMUSCULAR | Status: DC | PRN

## 2015-09-06 MED ORDER — DOCUSATE SODIUM 100 MG PO CAPS
100.0000 mg | ORAL_CAPSULE | Freq: Two times a day (BID) | ORAL | Status: DC
  Administered 2015-09-06 – 2015-09-07 (×2): 100 mg via ORAL
  Filled 2015-09-06 (×2): qty 1

## 2015-09-06 MED ORDER — ALUM & MAG HYDROXIDE-SIMETH 200-200-20 MG/5ML PO SUSP: 30.0000 mL | Freq: Four times a day (QID) | ORAL | Status: DC | PRN

## 2015-09-06 MED ORDER — POLYETHYLENE GLYCOL 3350 17 G PO PACK: 17.0000 g | PACK | Freq: Every day | ORAL | Status: DC | PRN

## 2015-09-06 MED ORDER — FAMOTIDINE 20 MG PO TABS: 20.0000 mg | ORAL_TABLET | Freq: Every day | ORAL | Status: DC

## 2015-09-06 MED ORDER — VITAMIN D3 25 MCG (1000 UNIT) PO TABS
2000.0000 [IU] | ORAL_TABLET | Freq: Every day | ORAL | Status: DC
Start: 2015-09-06 — End: 2015-09-07
  Administered 2015-09-07: 2000 [IU] via ORAL
  Filled 2015-09-06 (×3): qty 2

## 2015-09-06 MED ORDER — RALOXIFENE HCL 60 MG PO TABS
60.0000 mg | ORAL_TABLET | Freq: Every morning | ORAL | Status: DC
  Administered 2015-09-07: 60 mg via ORAL
  Filled 2015-09-06: qty 1

## 2015-09-06 MED ORDER — CLORAZEPATE DIPOTASSIUM 3.75 MG PO TABS
7.5000 mg | ORAL_TABLET | Freq: Two times a day (BID) | ORAL | Status: DC | PRN
Start: 2015-09-06 — End: 2015-09-07
  Administered 2015-09-07: 7.5 mg via ORAL
  Filled 2015-09-06: qty 2

## 2015-09-06 MED ORDER — ONDANSETRON HCL 4 MG PO TABS: 4.0000 mg | ORAL_TABLET | Freq: Four times a day (QID) | ORAL | Status: DC | PRN

## 2015-09-06 MED ORDER — PREDNISONE 5 MG PO TABS
2.5000 mg | ORAL_TABLET | Freq: Every day | ORAL | Status: DC
  Administered 2015-09-07: 2.5 mg via ORAL
  Filled 2015-09-06: qty 1

## 2015-09-06 MED ORDER — ATENOLOL 50 MG PO TABS
50.0000 mg | ORAL_TABLET | Freq: Every day | ORAL | Status: DC
Start: 2015-09-07 — End: 2015-09-07
  Administered 2015-09-07: 50 mg via ORAL
  Filled 2015-09-06: qty 1

## 2015-09-06 MED ORDER — IOHEXOL 300 MG/ML  SOLN
75.0000 mL | Freq: Once | INTRAMUSCULAR | Status: AC | PRN
  Administered 2015-09-06: 75 mL via INTRAVENOUS

## 2015-09-06 MED ORDER — HEPARIN SODIUM (PORCINE) 5000 UNIT/ML IJ SOLN
5000.0000 [IU] | Freq: Three times a day (TID) | INTRAMUSCULAR | Status: DC
  Administered 2015-09-06 – 2015-09-07 (×2): 5000 [IU] via SUBCUTANEOUS
  Filled 2015-09-06: qty 1

## 2015-09-06 MED ORDER — HYDROCODONE-ACETAMINOPHEN 5-325 MG PO TABS: 1.0000 | ORAL_TABLET | ORAL | Status: DC | PRN

## 2015-09-06 MED ORDER — ACETAMINOPHEN 650 MG RE SUPP: 650.0000 mg | Freq: Four times a day (QID) | RECTAL | Status: DC | PRN

## 2015-09-06 MED ORDER — SODIUM CHLORIDE 0.9 % IV BOLUS (SEPSIS)
1000.0000 mL | Freq: Once | INTRAVENOUS | Status: AC
  Administered 2015-09-06: 1000 mL via INTRAVENOUS

## 2015-09-06 MED ORDER — HYDROMORPHONE HCL 1 MG/ML IJ SOLN
1.0000 mg | INTRAMUSCULAR | Status: DC | PRN
Start: 2015-09-06 — End: 2015-09-07

## 2015-09-06 NOTE — ED Notes (Signed)
Attempted report 

## 2015-09-06 NOTE — ED Notes (Signed)
Pt from home with abdominal distention x 1 month with intermittent nausea.  Pt states she has been seen by her PCP several times and they have told her she is bloated with gas and given a prescription that she cannot remember the name of with no relief.  Pt states no change today from this past month. Pt states she had an abdominal xray last week and was told it was negative.  NAD, A&O.

## 2015-09-06 NOTE — ED Provider Notes (Signed)
CSN: [id number]     Arrival date & time 09/06/15  1049 History   First MD Initiated Contact with Patient 09/06/15 1326     Chief Complaint  Patient presents with  . Abdominal Pain     (Consider location/radiation/quality/duration/timing/severity/associated sxs/prior Treatment) Patient is a 74 y.o. female presenting with abdominal pain. The history is provided by the patient.  Abdominal Pain Pain location:  Generalized Pain quality: bloating   Pain radiates to:  Does not radiate Pain severity:  Moderate Onset quality:  Gradual Duration: 1 month. Timing:  Constant Progression:  Worsening Chronicity:  New Relieved by:  Nothing Worsened by:  Nothing tried Associated symptoms: anorexia and nausea   Associated symptoms: no constipation, no diarrhea, no fever, no hematochezia and no vomiting   Risk factors: being elderly   Risk factors comment:  H/o pelvic cancer   Past Medical History  Diagnosis Date  . Unspecified essential hypertension   . Mitral valve disorders   . Other and unspecified hyperlipidemia   . Other abnormal glucose   . Anxiety state, unspecified   . Anemia, unspecified   . 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 Transfusion history     "back in my 20's"  . 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 Osteoporosis   . 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 History of brachytherapy 06/01/15, 06/10/15, 06/17/15, 06/24/15, 07/01/15    vaginal cuff 27.5 gray   Past Surgical History  Procedure Laterality Date  . Cholecystectomy    . Tonsillectomy    . Breast lumpectomy      right breast BENIGN  . Robotic assisted total hysterectomy with bilateral salpingo oopherectomy Bilateral 04/20/2015    Procedure: ROBOTIC ASSISTED TOTAL HYSTERECTOMY WITH BILATERAL SALPINGO OOPHORECTOMY AND PELVIC AND PERIAORTIC LYMPHADECTOMY;  Surgeon: Janie Morning, MD;  Location: [facility];  Service: Gynecology;   Laterality: Bilateral;   Family History  Problem Relation Age of Onset  . Prostate cancer Brother     dx. 70s  . Stroke Father   . Breast cancer Sister     dx. 64-65  . Dementia Brother   . Breast cancer Sister 18    mastectomy  . Leukemia Sister     dx. 79s-70s   Social History  Substance Use Topics  . Smoking status: Never Smoker   . Smokeless tobacco: Never Used  . Alcohol Use: No   OB History    No data available     Review of Systems  Constitutional: Negative for fever.  Gastrointestinal: Positive for nausea, abdominal pain and anorexia. Negative for vomiting, diarrhea, constipation and hematochezia.  All other systems reviewed and are negative.     Allergies  Review of patient's allergies indicates no known allergies.  Home Medications   Prior to Admission medications   Medication Sig Start Date End Date Taking? Authorizing Provider  atenolol (TENORMIN) 50 MG tablet TAKE 1 TABLET DAILY 04/02/15   Lew Dawes V, MD  Cholecalciferol (VITAMIN D3) 2000 UNITS capsule Take 4,000 Units by mouth 2 (two) times a week.     Historical Provider, MD  clorazepate (TRANXENE) 7.5 MG tablet Take 1 tablet (7.5 mg total) by mouth 2 (two) times daily as needed for anxiety. 08/09/15   Aleksei Plotnikov V, MD  ibuprofen (ADVIL,MOTRIN) 800 MG tablet Take 1 tablet (800 mg total) by mouth every 8 (eight) hours as needed (  mild pain). 04/21/15   Everitt Amber, MD  irbesartan-hydrochlorothiazide (AVALIDE) 300-12.5 MG per tablet take 1 tablet by mouth once daily 04/02/15   Lew Dawes V, MD  loperamide (IMODIUM A-D) 2 MG tablet Take 1-2 tablets (2-4 mg total) by mouth 4 (four) times daily as needed for diarrhea or loose stools (take 1 hr prior to eating out as needed). 08/09/15   Aleksei Plotnikov V, MD  Multiple Vitamins-Minerals (CENTRUM SILVER PO) Take 1 capsule by mouth 2 (two) times a week.     Historical Provider, MD  potassium chloride SA (K-DUR,KLOR-CON) 20 MEQ tablet Take 1  tablet (20 mEq total) by mouth daily. 04/02/15   Aleksei Plotnikov V, MD  predniSONE (DELTASONE) 5 MG tablet Take 0.5 tablets (2.5 mg total) by mouth daily. 06/19/14   Aleksei Plotnikov V, MD  raloxifene (EVISTA) 60 MG tablet Take 60 mg by mouth every morning.     Historical Provider, MD  ranitidine (ZANTAC) 150 MG capsule Take 1 capsule (150 mg total) by mouth every evening. 09/02/15   Golden Circle, FNP   BP 127/84 mmHg  Pulse 113  Temp(Src) 98.1 F (36.7 C) (Oral)  Resp 16  SpO2 94% Physical Exam  Constitutional: She is oriented to person, place, and time. She appears well-developed and well-nourished. No distress.  HENT:  Head: Normocephalic.  Eyes: Conjunctivae are normal.  Neck: Neck supple. No tracheal deviation present.  Cardiovascular: Normal rate and regular rhythm.   Pulmonary/Chest: Effort normal. No respiratory distress.  Abdominal: Soft. She exhibits shifting dullness and distension. There is no tenderness. There is no rigidity, no rebound and no guarding.  Neurological: She is alert and oriented to person, place, and time.  Skin: Skin is warm and dry.  Psychiatric: She has a normal mood and affect.    ED Course  Procedures (including critical care time) Labs Review Labs Reviewed  COMPREHENSIVE METABOLIC PANEL - Abnormal; Notable for the following:    Sodium 134 (*)    Glucose, Bld 151 (*)    Creatinine, Ser 1.29 (*)    Albumin 2.9 (*)    ALT 12 (*)    GFR calc non Af Amer 40 (*)    GFR calc Af Amer 46 (*)    All other components within normal limits  CBC - Abnormal; Notable for the following:    Hemoglobin 11.8 (*)    All other components within normal limits  URINALYSIS, ROUTINE W REFLEX MICROSCOPIC (NOT AT [facility]) - Abnormal; Notable for the following:    Color, Urine AMBER (*)    APPearance CLOUDY (*)    Bilirubin Urine SMALL (*)    Protein, ur 30 (*)    Leukocytes, UA SMALL (*)    All other components within normal limits  URINE MICROSCOPIC-ADD ON -  Abnormal; Notable for the following:    Squamous Epithelial / LPF 0-5 (*)    Bacteria, UA FEW (*)    Casts HYALINE CASTS (*)    All other components within normal limits  LIPASE, BLOOD    Imaging Review Ct Abdomen Pelvis W Contrast  09/06/2015  CLINICAL DATA:  Upper abdominal pain, distention. History of endometrial cancer and prior surgery and radiation. EXAM: CT ABDOMEN AND PELVIS WITH CONTRAST TECHNIQUE: Multidetector CT imaging of the abdomen and pelvis was performed using the standard protocol following bolus administration of intravenous contrast. CONTRAST:  1mL OMNIPAQUE IOHEXOL 300 MG/ML  SOLN COMPARISON:  06/14/2015 FINDINGS: Linear areas of atelectasis in the lung bases bilaterally. Heart is  normal size. No effusions. Prior cholecystectomy. Large volume ascites in the abdomen and pelvis. No focal lesion within the liver, spleen, pancreas, adrenals and kidneys. No hydronephrosis. Abnormal soft tissue noted within the omentum anteriorly throughout the abdomen and upper pelvis concerning for omental metastatic disease. No evidence of bowel obstruction. Small bilateral inguinal hernias containing fat. Urinary bladder is unremarkable. Prior hysterectomy and presumed oophorectomy. Aorta is normal caliber. No acute bony abnormality or focal bone lesion. IMPRESSION: Extensive nodularity and stranding throughout the omentum anteriorly concerning for metastatic disease. Large volume ascites in the abdomen and pelvis. Electronically Signed   By: Rolm Baptise M.D.   On: 09/06/2015 15:53   I have personally reviewed and evaluated these images and lab results as part of my medical decision-making.   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 female presents with ongoing abdominal distention, discomfort, and nausea with loss of appetite over the last month. Has h/o pelvic Ca which was treated with resection and radiation 4-5  months ago. No bowel obstruction symptoms. Has marked distention and shifting dullness.CT abdomen pelvis repeated to evaluate for interval changes over last 5 months. Scan c/w progressive oncologic process with metastatic lesions to omentum and large volume ascites. Discussed results with Pt and family regarding poor prognostic implications. Hospitalist was consulted for admission and will see the patient in the emergency department.     Leo Grosser, MD 09/06/15 2012

## 2015-09-06 NOTE — ED Notes (Signed)
Patient transported to CT 

## 2015-09-06 NOTE — ED Notes (Signed)
MD Knott at the bedside   

## 2015-09-06 NOTE — Progress Notes (Signed)
COURTESY NOTE:  74 y/o [place] woman followed by Dr Everitt Amber [facility]) s/p Robotic total laparoscopic hysterectomy, Bilateral salpingo oophorectomy, Bilateral pelvic lymph node dissection, bilateral para-aortic lymph node dissection.04/20/2015, all nodes negative, margins negative, endometrial invasion by adenocacinoma  0.35 cm /1.1 cm, and so stage IA, grade 2; now presenting with ascites and evidence of peritoneal carcinomatosis by Ct abd/pelvis but no suggestion of SBO.  Paracentesis with cytology already scheduled for 09/07/2015  I have discussed the case w [name] and she will see the patient tomorrow-- however possibly not before 6 PM. If patient does well with the paracentesis, since there is no evidence of SBO,  there is no need to prolong her stay and she could be discharged tomorrow at your discretion, with outpatient follow-up w [name] next week (by which time we will have the cytology results)  Discussed w patient and family.  Please let me know if I can be of further help.

## 2015-09-06 NOTE — H&P (Signed)
History and Physical        Hospital Admission Note Date: 09/06/2015  Patient name: Sharon Holt Medical record number: [id number] Date of birth: 1941-07-12 Age: 74 y.o. Gender: female  PCP: Walker Kehr, MD  Referring physician: Dr Laneta Simmers  Chief Complaint:  Abdominal distention, pain, nausea for almost a month  HPI: Patient is a 74 year old female with hypertension, anxiety, hyperlipidemia, Recent diagnosis of serous endometrial carcinoma, stage I grade 2 ([name]), underwent total hysterectomy and radiation treatments presented to ED with abdominal distention with intermittent nausea for last 1 month. Patient reported that she has been having abdominal bloating, constipation for almost a month and was seen by her PCP several times. She used laxatives and Pepto-Bismol however with no significant improvement. Last bowel movement yesterday. Per patient, bloating was not going away hence she presented to the ED. Otherwise she denied any fevers or chills, any vomiting, hematochezia or melena. She denied any exertional dyspnea or gain of weight except abdominal bloating.  CT of the abdomen and pelvis done in ED showed extensive nodularity and stranding throughout the omentum anteriorly concerning for metastatic disease, large volume ascites in the abdomen and pelvis. CBC, BMET otherwise unremarkable except creatinine slightly elevated at 1.29, last creatinine 0.91 and on 10/24    Review of Systems:  Constitutional: Denies fever, chills, diaphoresis, poor appetite and fatigue.  HEENT: Denies photophobia, eye pain, redness, hearing loss, ear pain, congestion, sore throat, rhinorrhea, sneezing, mouth sores, trouble swallowing, neck pain, neck stiffness and tinnitus.   Respiratory: Denies SOB, DOE, cough, chest tightness,  and wheezing.   Cardiovascular: Denies chest pain,  palpitations and leg swelling.  Gastrointestinal: please see history of present illness  Genitourinary: Denies dysuria, urgency, frequency, hematuria, flank pain and difficulty urinating.  Musculoskeletal: Denies myalgias, back pain, joint swelling, arthralgias and gait problem.  Skin: Denies pallor, rash and wound.  Neurological: Denies dizziness, seizures, syncope, weakness, light-headedness, numbness and headaches.  Hematological: Denies adenopathy. Easy bruising, personal or family bleeding history  Psychiatric/Behavioral: Denies suicidal ideation, mood changes, confusion, nervousness, sleep disturbance and agitation  Past Medical History: Past Medical History  Diagnosis Date  . Unspecified essential hypertension   . Mitral valve disorders   . Other and unspecified hyperlipidemia   . Other abnormal glucose   . Anxiety state, unspecified   . Anemia, unspecified   . 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 Transfusion history     "back in my 20's"  . 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 Osteoporosis   . 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 History of brachytherapy 06/01/15, 06/10/15, 06/17/15, 06/24/15, 07/01/15    vaginal cuff 27.5 gray    Past Surgical History  Procedure Laterality Date  . Cholecystectomy    . Tonsillectomy    . Breast lumpectomy      right breast BENIGN  . Robotic assisted total hysterectomy with bilateral salpingo oopherectomy Bilateral 04/20/2015    Procedure: ROBOTIC ASSISTED TOTAL HYSTERECTOMY WITH BILATERAL SALPINGO OOPHORECTOMY AND PELVIC AND PERIAORTIC LYMPHADECTOMY;  Surgeon: Janie Morning, MD;  Location: [facility];  Service: Gynecology;  Laterality: Bilateral;    Medications: Prior to Admission medications   Medication Sig Start Date End Date Taking? Authorizing Provider  atenolol (TENORMIN) 50 MG tablet TAKE 1 TABLET DAILY Patient taking differently: Take 50 mg by  mouth daily. TAKE 1 TABLET DAILY 04/02/15  Yes Aleksei Plotnikov V, MD  Cholecalciferol (VITAMIN D3) 2000 UNITS capsule Take 2,000 Units by mouth daily.    Yes Historical Provider, MD  clorazepate (TRANXENE) 7.5 MG tablet Take 1 tablet (7.5 mg total) by mouth 2 (two) times daily as needed for anxiety. 08/09/15  Yes Aleksei Plotnikov V, MD  irbesartan-hydrochlorothiazide (AVALIDE) 300-12.5 MG per tablet take 1 tablet by mouth once daily 04/02/15  Yes Aleksei Plotnikov V, MD  loperamide (IMODIUM A-D) 2 MG tablet Take 1-2 tablets (2-4 mg total) by mouth 4 (four) times daily as needed for diarrhea or loose stools (take 1 hr prior to eating out as needed). 08/09/15  Yes Aleksei Plotnikov V, MD  Multiple Vitamins-Minerals (CENTRUM SILVER PO) Take 1 capsule by mouth daily.    Yes Historical Provider, MD  potassium chloride SA (K-DUR,KLOR-CON) 20 MEQ tablet Take 1 tablet (20 mEq total) by mouth daily. 04/02/15  Yes Aleksei Plotnikov V, MD  predniSONE (DELTASONE) 5 MG tablet Take 0.5 tablets (2.5 mg total) by mouth daily. 06/19/14  Yes Aleksei Plotnikov V, MD  raloxifene (EVISTA) 60 MG tablet Take 60 mg by mouth every morning.    Yes Historical Provider, MD  ranitidine (ZANTAC) 150 MG capsule Take 1 capsule (150 mg total) by mouth every evening. 09/02/15  Yes Golden Circle, FNP  ibuprofen (ADVIL,MOTRIN) 800 MG tablet Take 1 tablet (800 mg total) by mouth every 8 (eight) hours as needed (mild pain). 04/21/15   Everitt Amber, MD    Allergies:  No Known Allergies  Social History:  reports that she has never smoked. She has never used smokeless tobacco. She reports that she does not drink alcohol or use illicit drugs.  Family History: Family History  Problem Relation Age of Onset  . Prostate cancer Brother     dx. 94s  . Stroke Father   . Breast cancer Sister     dx. 64-65  . Dementia Brother   . Breast cancer Sister 17    mastectomy  . Leukemia Sister     dx. 64s-70s    Physical Exam: Blood pressure  126/75, pulse 99, temperature 98.1 F (36.7 C), temperature source Oral, resp. rate 16, SpO2 100 %. General: Alert, awake, oriented x3, in no acute distress. HEENT: normocephalic, atraumatic, anicteric sclera, pink conjunctiva, pupils equal and reactive to light and accomodation, oropharynx clear Neck: supple, no masses or lymphadenopathy, no goiter, no bruits  Heart: Regular rate and rhythm, without murmurs, rubs or gallops. Lungs: Clear to auscultation bilaterally, no wheezing, rales or rhonchi. Abdomen: Soft, + distended, + bowel sounds, ascites, NT  Extremities: No clubbing, cyanosis or edema with positive pedal pulses. Neuro: Grossly intact, no focal neurological deficits, strength 5/5 upper and lower extremities bilaterally Psych: alert and oriented x 3, normal mood and affect Skin: no rashes or lesions, warm and dry   LABS on Admission:  Basic Metabolic Panel:  Recent Labs Lab 09/06/15 1123  NA 134*  K 4.4  CL 101  CO2 24  GLUCOSE 151*  BUN 11  CREATININE 1.29*  CALCIUM 8.9   Liver Function Tests:  Recent Labs Lab 09/06/15 1123  AST 28  ALT 12*  ALKPHOS 42  BILITOT 0.7  PROT 6.8  ALBUMIN 2.9*    Recent Labs Lab 09/06/15 1123  LIPASE 36   No results for input(s): AMMONIA in the last 168 hours. CBC:  Recent Labs Lab 09/06/15 1123  WBC 7.0  HGB 11.8*  HCT 36.2  MCV 91.4  PLT 314   Cardiac Enzymes: No results for input(s): CKTOTAL, CKMB, CKMBINDEX, TROPONINI in the last 168 hours. BNP: Invalid input(s): POCBNP CBG: No results for input(s): GLUCAP in the last 168 hours.  Radiological Exams on Admission:  Ct Abdomen Pelvis W Contrast  09/06/2015  CLINICAL DATA:  Upper abdominal pain, distention. History of endometrial cancer and prior surgery and radiation. EXAM: CT ABDOMEN AND PELVIS WITH CONTRAST TECHNIQUE: Multidetector CT imaging of the abdomen and pelvis was performed using the standard protocol following bolus administration of intravenous  contrast. CONTRAST:  51mL OMNIPAQUE IOHEXOL 300 MG/ML  SOLN COMPARISON:  06/14/2015 FINDINGS: Linear areas of atelectasis in the lung bases bilaterally. Heart is normal size. No effusions. Prior cholecystectomy. Large volume ascites in the abdomen and pelvis. No focal lesion within the liver, spleen, pancreas, adrenals and kidneys. No hydronephrosis. Abnormal soft tissue noted within the omentum anteriorly throughout the abdomen and upper pelvis concerning for omental metastatic disease. No evidence of bowel obstruction. Small bilateral inguinal hernias containing fat. Urinary bladder is unremarkable. Prior hysterectomy and presumed oophorectomy. Aorta is normal caliber. No acute bony abnormality or focal bone lesion. IMPRESSION: Extensive nodularity and stranding throughout the omentum anteriorly concerning for metastatic disease. Large volume ascites in the abdomen and pelvis. Electronically Signed   By: Rolm Baptise M.D.   On: 09/06/2015 15:53   Dg Abd 2 Views  09/02/2015  CLINICAL DATA:  Bloating and constipation. EXAM: ABDOMEN - 2 VIEW COMPARISON:  None. FINDINGS: There is a moderate amount of stool in the ascending colon. There is no bowel dilatation to suggest obstruction. There is no evidence of pneumoperitoneum, portal venous gas or pneumatosis. There are no pathologic calcifications along the expected course of the ureters. The osseous structures are unremarkable. IMPRESSION: Negative. Electronically Signed   By: Kathreen Devoid   On: 09/02/2015 16:02    *I have personally reviewed the images above*     Assessment/Plan Principal Problem:   Abdominal distension likely due to ascites and peritoneal carcinomatosis, newly diagnosed with underlying history of endometrial cancer - Admit for observation, NPO after midnight for paracentesis in a.m., likely malignant ascites - Labs for paracentesis ordered - Oncology consult called, discussed with Dr. Jana Hakim, will discuss with patient regarding  further options..  Active Problems:  Mild acute kidney injury - Hold irbesartan and HCTZ - recheck BMET in a.m.    Essential hypertension - Continue beta blocker  Anxiety - Continueclorazepate    Ascites - US guided paracenteses in a.m.   DVT prophylaxis: Heparin subcutaneous   CODE STATUS: DNR/DNI, discussed with patient in detail   Family Communication: Admission, patients condition and plan of care including tests being ordered have been discussed with the patient and  Husband who indicates understanding and agree with the plan and Code Status    Time Spent on Admission: 78mins   Olamide Lahaie M.D. [facility] 09/06/2015, 4:25 PM Pager: [phone]  If 7PM-7AM, please contact night-coverage [email or web address] Password [email or web address]

## 2015-09-07 ENCOUNTER — Ambulatory Visit: Payer: Commercial Managed Care - HMO | Admitting: Internal Medicine

## 2015-09-07 ENCOUNTER — Observation Stay (HOSPITAL_COMMUNITY): Payer: Commercial Managed Care - HMO

## 2015-09-07 DIAGNOSIS — C801 Malignant (primary) neoplasm, unspecified: Secondary | ICD-10-CM | POA: Diagnosis not present

## 2015-09-07 DIAGNOSIS — C786 Secondary malignant neoplasm of retroperitoneum and peritoneum: Secondary | ICD-10-CM | POA: Diagnosis present

## 2015-09-07 DIAGNOSIS — N179 Acute kidney failure, unspecified: Secondary | ICD-10-CM | POA: Diagnosis not present

## 2015-09-07 DIAGNOSIS — R140 Abdominal distension (gaseous): Secondary | ICD-10-CM | POA: Diagnosis not present

## 2015-09-07 DIAGNOSIS — C800 Disseminated malignant neoplasm, unspecified: Secondary | ICD-10-CM

## 2015-09-07 LAB — GRAM STAIN

## 2015-09-07 LAB — BODY FLUID CELL COUNT WITH DIFFERENTIAL
EOS FL: NONE SEEN %
Lymphs, Fluid: 44 %
MONOCYTE-MACROPHAGE-SEROUS FLUID: 37 % — AB (ref 50–90)
NEUTROPHIL FLUID: 19 % (ref 0–25)
WBC FLUID: 917 uL (ref 0–1000)

## 2015-09-07 LAB — CBC
HCT: 32.8 % — ABNORMAL LOW (ref 36.0–46.0)
Hemoglobin: 11 g/dL — ABNORMAL LOW (ref 12.0–15.0)
MCH: 30.9 pg (ref 26.0–34.0)
MCHC: 33.5 g/dL (ref 30.0–36.0)
MCV: 92.1 fL (ref 78.0–100.0)
PLATELETS: 295 10*3/uL (ref 150–400)
RBC: 3.56 MIL/uL — AB (ref 3.87–5.11)
RDW: 12.2 % (ref 11.5–15.5)
WBC: 6.2 10*3/uL (ref 4.0–10.5)

## 2015-09-07 LAB — PROTEIN, BODY FLUID: Total protein, fluid: 4.5 g/dL

## 2015-09-07 LAB — GLUCOSE, PERITONEAL FLUID: Glucose, Peritoneal Fluid: 109 mg/dL

## 2015-09-07 LAB — BASIC METABOLIC PANEL
Anion gap: 6 (ref 5–15)
BUN: 10 mg/dL (ref 6–20)
CALCIUM: 8.3 mg/dL — AB (ref 8.9–10.3)
CHLORIDE: 102 mmol/L (ref 101–111)
CO2: 25 mmol/L (ref 22–32)
CREATININE: 1.17 mg/dL — AB (ref 0.44–1.00)
GFR calc Af Amer: 52 mL/min — ABNORMAL LOW (ref >60)
GFR calc non Af Amer: 45 mL/min — ABNORMAL LOW (ref >60)
GLUCOSE: 117 mg/dL — AB (ref 65–99)
Potassium: 4.3 mmol/L (ref 3.5–5.1)
Sodium: 133 mmol/L — ABNORMAL LOW (ref 135–145)

## 2015-09-07 LAB — LACTATE DEHYDROGENASE, PLEURAL OR PERITONEAL FLUID: LD FL: 462 U/L — AB (ref 3–23)

## 2015-09-07 MED ORDER — POLYETHYLENE GLYCOL 3350 17 G PO PACK: 17.0000 g | PACK | Freq: Every day | ORAL | Status: DC | PRN

## 2015-09-07 MED ORDER — DOCUSATE SODIUM 100 MG PO CAPS: 100.0000 mg | ORAL_CAPSULE | Freq: Two times a day (BID) | ORAL | Status: DC

## 2015-09-07 MED ORDER — LIDOCAINE HCL (PF) 1 % IJ SOLN
INTRAMUSCULAR | Status: AC
  Filled 2015-09-07: qty 10

## 2015-09-07 NOTE — Procedures (Signed)
Successful US guided paracentesis from RLQ.  Yielded 5 liters of serous fluid.  No immediate complications.  Pt tolerated well.   Specimen was sent for labs.  Tsosie Billing D PA-C 09/07/2015 10:26 AM

## 2015-09-07 NOTE — Discharge Instructions (Signed)
Ascites °Ascites is a collection of excess fluid in the abdomen. Ascites can range from mild to severe. It can get worse without treatment. °CAUSES °Possible causes include: °· Cirrhosis. This is the most common cause of ascites. °· Infection or inflammation in the abdomen. °· Cancer in the abdomen. °· Heart failure. °· Kidney disease. °· Inflammation of the pancreas. °· Clots in the veins of the liver. °SIGNS AND SYMPTOMS °Signs and symptoms may include: °· A feeling of fullness in your abdomen. This is common. °· An increase in the size of your abdomen or your waist. °· Swelling in your legs. °· Swelling of the scrotum in men. °· Difficulty breathing. °· Abdominal pain. °· Sudden weight gain. °If the condition is mild, you may not have symptoms. °DIAGNOSIS °To make a diagnosis, your health care provider will: °· Ask about your medical history. °· Perform a physical exam. °· Order imaging tests, such as an ultrasound or CT scan of your abdomen. °TREATMENT °Treatment depends on the cause of the ascites. It may include: °· Taking a pill to make you urinate. This is called a water pill (diuretic pill). °· Strictly reducing your salt (sodium) intake. Salt can cause extra fluid to be kept in the body, and this makes ascites worse. °· Having a procedure to remove fluid from your abdomen (paracentesis). °· Having a procedure to transfer fluid from your abdomen into a vein. °· Having a procedure that connects two of the major veins within your liver and relieves pressure on your liver (TIPS procedure). °Ascites may go away or improve with treatment of the condition that caused it.  °HOME CARE INSTRUCTIONS °· Keep track of your weight. To do this, weigh yourself at the same time every day and record your weight. °· Keep track of how much you drink and any changes in the amount you urinate. °· Follow any instructions that your health care provider gives you about how much to drink. °· Try not to eat salty (high-sodium)  foods. °· Take medicines only as directed by your health care provider. °· Keep all follow-up visits as directed by your health care provider. This is important. °· Report any changes in your health to your health care provider, especially if you develop new symptoms or your symptoms get worse. °SEEK MEDICAL CARE IF: °· Your gain more than 3 pounds in 3 days. °· Your abdominal size or your waist size increases. °· You have new swelling in your legs. °· The swelling in your legs gets worse. °SEEK IMMEDIATE MEDICAL CARE IF: °· You develop a fever. °· You develop confusion. °· You develop new or worsening difficulty breathing. °· You develop new or worsening abdominal pain. °· You develop new or worsening swelling in the scrotum (in men). °  °This information is not intended to replace advice given to you by your health care provider. Make sure you discuss any questions you have with your health care provider. °  °Document Released: 10/02/2005 Document Revised: 10/23/2014 Document Reviewed: 05/01/2014 °Elsevier Interactive Patient Education ©2016 [employer] ° °

## 2015-09-07 NOTE — Progress Notes (Signed)
Discharge orders received. Pt and spouse educated on discharge instructions. Pt verbalized understanding. MD stated that pt did not need to go home with miralax or colace. Pt aware. IV removed. Pt dressed herself and packed her belongings. Pt taken downstairs by staff via wheelchair.

## 2015-09-07 NOTE — Care Management Note (Signed)
Case Management Note  Patient Details  Name: TANNIKA COPEMAN MRN: [id number] Date of Birth: 05-31-1941  Subjective/Objective:       Patient dc home no needs.             Action/Plan:   Expected Discharge Date:                  Expected Discharge Plan:  Home/Self Care  In-House Referral:     Discharge planning Services  CM Consult  Post Acute Care Choice:    Choice offered to:     DME Arranged:    DME Agency:     HH Arranged:    [place] Agency:     Status of Service:  Completed, signed off  Medicare Important Message Given:    Date Medicare IM Given:    Medicare IM give by:    Date Additional Medicare IM Given:    Additional Medicare Important Message give by:     If discussed at [place] of Stay Meetings, dates discussed:    Additional Comments:  Zenon Mayo, RN 09/07/2015, 1:19 PM

## 2015-09-07 NOTE — Progress Notes (Signed)
OT Cancellation Note  Patient Details Name: Sharon Holt MRN: [id number] DOB: 01-Dec-1940   Cancelled Treatment:    Reason Eval/Treat Not Completed: OT screened, no needs identified, will sign off  Benito Mccreedy OTR/L [id number] 09/07/2015, 11:25 AM

## 2015-09-07 NOTE — Discharge Summary (Addendum)
Physician Discharge Summary  Sharon Holt [id number] DOB: [birth year] DOA: 09/06/2015  PCP: Sharon Kehr, MD  Admit date: 09/06/2015 Discharge date: 09/07/2015  Time spent: 25 minutes  Recommendations for Outpatient Follow-up:  #1 Discharge home with outpatient follow-up with Dr. Everitt Holt in 1 week. Please follow peritoneal fluid cytology. #2 Follow-up with PCP in one week.. Monitoring of function and if stable may resume HCTZ-irbesartan.   Discharge Diagnoses:  Principal Problem:   Abdominal distension   Active Problems:   Dyslipidemia   Essential hypertension   Abdominal pain   Metastatic disease ([name])   Ascites   Acute kidney injury ([name])   Peritoneal carcinomatosis ([place])   constipation   Discharge Condition: Fair  Diet recommendation: Low sodium  There were no vitals filed for this visit.  History of present illness:  Please refer  to admission H&P for details, in brief, 74 year old female with hypertension, anxiety, hyperlipidemia, Recent diagnosis of serous endometrial carcinoma, stage I grade 2 (Dr Sharon Holt), underwent total hysterectomy and radiation treatments presented to ED with abdominal distention with intermittent nausea for last 1 month. Patient reported that she has been having abdominal bloating, constipation for almost a month and was seen by her PCP several times. She used laxatives and Pepto-Bismol however with no significant improvement. Last bowel movement was the day prior to admission. Since bloating was persistent she came to the ED.  she denied any fevers or chills, any vomiting, hematochezia or melena. She denied any exertional dyspnea or gain of weight except abdominal bloating. CT of the abdomen and pelvis done in ED showed extensive nodularity and stranding throughout the omentum anteriorly concerning for metastatic disease, large volume ascites in the abdomen and pelvis. CBC, BMET otherwise unremarkable except creatinine slightly  elevated at 1.29, last creatinine 0.91 and on 10/24. Admitted to hospitalist service and oncology consulted.  Hospital Course:  Abdominal distention secondary to ascites with peritoneal carcinomatosis in the setting of underlying endometrial cancer (newly diagnosed) Patient kept nothing by mouth and underwent paracentesis with 5 L serous fluid drained. Sent for fluid analysis and cytology. Abdominal bloating and pain symptoms have resolved after paracentesis. Cell count negative for SBP. Seen by oncologist Dr. Jana Holt who has discussed with Dr. Denman Holt who informed him that she would see the patient later in the evening today.  This patient's symptoms have resolved after paracentesis she can be discharged home and follow-up with Dr. Denman Holt next week. Cytology should be available by then. Patient has been instructed to return to the ED if she has similar symptoms abdominal distention with/ without pain or shortness of breath.  Acute kidney injury Mild. Holding HCTZ and irbesartan. Renal function improving this morning. We'll hold off on these medications until follow-up with PCP in one week and have her renal function checked.   Essential hypertension Stable. Continue atenolol.  Anxiety Continue clorazepate  CODE STATUS: DO NOT RESUSCITATE Family communication: Husband and daughter at bedside Disposition: Home with outpatient GYN oncology follow-up  Procedures:   Ultrasound paracentesis  CT abdomen and pelvis  Consultations:  Dr Sharon Holt  IR  Discharge Exam: Filed Vitals:   09/07/15 0948 09/07/15 1033  BP: 124/59 114/68  Pulse:  102  Temp:    Resp:      General: Elderly female not in distress HEENT: No pallor, moist oral mucosa Chest: Clear to auscultation bilaterally CVS: Normal S1 and S2, no murmurs  GI: Soft, nondistended, nontender, bowel sounds present Musculoskeletal: Warm, no edema  Discharge Instructions  CONTINUE these  medications which have NOT CHANGED    Details  atenolol (TENORMIN) 50 MG tablet TAKE 1 TABLET DAILY Qty: 90 tablet, Refills: 3    Cholecalciferol (VITAMIN D3) 2000 UNITS capsule Take 2,000 Units by mouth daily.     clorazepate (TRANXENE) 7.5 MG tablet Take 1 tablet (7.5 mg total) by mouth 2 (two) times daily as needed for anxiety. Qty: 180 tablet, Refills: 1    Multiple Vitamins-Minerals (CENTRUM SILVER PO) Take 1 capsule by mouth daily.     potassium chloride SA (K-DUR,KLOR-CON) 20 MEQ tablet Take 1 tablet (20 mEq total) by mouth daily. Qty: 90 tablet, Refills: 3    predniSONE (DELTASONE) 5 MG tablet Take 0.5 tablets (2.5 mg total) by mouth daily. Qty: 45 tablet, Refills: 5    raloxifene (EVISTA) 60 MG tablet Take 60 mg by mouth every morning.     ranitidine (ZANTAC) 150 MG capsule Take 1 capsule (150 mg total) by mouth every evening. Qty: 30 capsule, Refills: 0   Associated Diagnoses: Bloating      STOP taking these medications     irbesartan-hydrochlorothiazide (AVALIDE) 300-12.5 MG per tablet      loperamide (IMODIUM A-D) 2 MG tablet      ibuprofen (ADVIL,MOTRIN) 800 MG tablet                           No Known Allergies Follow-up Information    Follow up with Sharon Kehr, MD. Schedule an appointment as soon as possible for a visit in 1 week.   Specialty:  Internal Medicine   Contact information:   [place] [address] [phone]       Follow up with Sharon Eva, MD. Schedule an appointment as soon as possible for a visit in 1 week.   Specialty:  Obstetrics and Gynecology   Contact information:   [place] [id number] [phone]        The results of significant diagnostics from this hospitalization (including imaging, microbiology, ancillary and laboratory) are listed below for reference.    Significant Diagnostic Studies: Ct Abdomen Pelvis W Contrast  09/06/2015  CLINICAL DATA:  Upper  abdominal pain, distention. History of endometrial cancer and prior surgery and radiation. EXAM: CT ABDOMEN AND PELVIS WITH CONTRAST TECHNIQUE: Multidetector CT imaging of the abdomen and pelvis was performed using the standard protocol following bolus administration of intravenous contrast. CONTRAST:  16mL OMNIPAQUE IOHEXOL 300 MG/ML  SOLN COMPARISON:  06/14/2015 FINDINGS: Linear areas of atelectasis in the lung bases bilaterally. Heart is normal size. No effusions. Prior cholecystectomy. Large volume ascites in the abdomen and pelvis. No focal lesion within the liver, spleen, pancreas, adrenals and kidneys. No hydronephrosis. Abnormal soft tissue noted within the omentum anteriorly throughout the abdomen and upper pelvis concerning for omental metastatic disease. No evidence of bowel obstruction. Small bilateral inguinal hernias containing fat. Urinary bladder is unremarkable. Prior hysterectomy and presumed oophorectomy. Aorta is normal caliber. No acute bony abnormality or focal bone lesion. IMPRESSION: Extensive nodularity and stranding throughout the omentum anteriorly concerning for metastatic disease. Large volume ascites in the abdomen and pelvis. Electronically Signed   By: Rolm Baptise M.D.   On: 09/06/2015 15:53   Dg Abd 2 Views  09/02/2015  CLINICAL DATA:  Bloating and constipation. EXAM: ABDOMEN - 2 VIEW COMPARISON:  None. FINDINGS: There is a moderate amount of stool in the ascending colon. There is no bowel dilatation to suggest obstruction. There  is no evidence of pneumoperitoneum, portal venous gas or pneumatosis. There are no pathologic calcifications along the expected course of the ureters. The osseous structures are unremarkable. IMPRESSION: Negative. Electronically Signed   By: Kathreen Devoid   On: 09/02/2015 16:02    Microbiology: No results found for this or any previous visit (from the past 240 hour(s)).   Labs: Basic Metabolic Panel:  Recent Labs Lab 09/06/15 1123  09/07/15 0535  NA 134* 133*  K 4.4 4.3  CL 101 102  CO2 24 25  GLUCOSE 151* 117*  BUN 11 10  CREATININE 1.29* 1.17*  CALCIUM 8.9 8.3*   Liver Function Tests:  Recent Labs Lab 09/06/15 1123  AST 28  ALT 12*  ALKPHOS 42  BILITOT 0.7  PROT 6.8  ALBUMIN 2.9*    Recent Labs Lab 09/06/15 1123  LIPASE 36   No results for input(s): AMMONIA in the last 168 hours. CBC:  Recent Labs Lab 09/06/15 1123 09/07/15 0535  WBC 7.0 6.2  HGB 11.8* 11.0*  HCT 36.2 32.8*  MCV 91.4 92.1  PLT 314 295   Cardiac Enzymes: No results for input(s): CKTOTAL, CKMB, CKMBINDEX, TROPONINI in the last 168 hours. BNP: BNP (last 3 results) No results for input(s): BNP in the last 8760 hours.  ProBNP (last 3 results) No results for input(s): PROBNP in the last 8760 hours.  CBG: No results for input(s): GLUCAP in the last 168 hours.     SignedLouellen Molder  [facility] 09/07/2015, 11:04 AM

## 2015-09-08 ENCOUNTER — Telehealth: Payer: Self-pay | Admitting: *Deleted

## 2015-09-08 LAB — AMYLASE, PERITONEAL FLUID: Amylase, peritoneal fluid: 33 U/L

## 2015-09-08 NOTE — Telephone Encounter (Signed)
Transition Care Management Follow-up Telephone Call   Date discharged? 09/07/15   How have you been since you were released from the hospital? Pt states she is doing ok   Do you understand why you were in the hospital? YES   Do you understand the discharge instructions? YES   Where were you discharged to? Home    Items Reviewed:  Medications reviewed: YES  Allergies reviewed: NO  Dietary changes reviewed: NO  Referrals reviewed: No referral needing will be f/u with dr. Denman George on next Monday 11/28   Functional Questionnaire:   Activities of Daily Living (ADLs):   She states they are independent in the following: ambulation, bathing and hygiene, feeding, continence, grooming, toileting and dressing States they require assistance with the following: ambulation   Any transportation issues/concerns?: YES   Any patient concerns? NO   Confirmed importance and date/time of follow-up visits scheduled YES, appt 09/17/15  Provider Appointment booked with Dr. Alain [place]  Confirmed with patient if condition begins to worsen call PCP or go to the ER.  Patient was given the office number and encouraged to call back with question or concerns.  : YES

## 2015-09-12 LAB — CULTURE, BODY FLUID W GRAM STAIN -BOTTLE: Culture: NO GROWTH

## 2015-09-12 LAB — CULTURE, BODY FLUID-BOTTLE

## 2015-09-13 ENCOUNTER — Encounter: Payer: Self-pay | Admitting: Gynecologic Oncology

## 2015-09-13 ENCOUNTER — Other Ambulatory Visit: Payer: Self-pay | Admitting: *Deleted

## 2015-09-13 ENCOUNTER — Other Ambulatory Visit: Payer: Self-pay | Admitting: Oncology

## 2015-09-13 ENCOUNTER — Ambulatory Visit: Payer: Commercial Managed Care - HMO | Attending: Gynecologic Oncology | Admitting: Gynecologic Oncology

## 2015-09-13 VITALS — BP 120/69 | HR 108 | Temp 98.6°F | Resp 19 | Ht 65.0 in | Wt 183.1 lb

## 2015-09-13 DIAGNOSIS — C541 Malignant neoplasm of endometrium: Secondary | ICD-10-CM | POA: Diagnosis not present

## 2015-09-13 NOTE — Progress Notes (Signed)
Follow Up Note: Gyn-Onc  Sharon Holt 74 y.o. female  CC:  Chief Complaint  Patient presents with  . recurrent endometrial cancer  Treatment counseling.  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 She has peritoneal recurrence in a widespread fashion with omental disease and ascites. Her preop bx had shown serous features, but final pathology did not, and therefore she did not receive adjuvant chemotherapy.  I performed a history, physical examination, and personally reviewed the patient's imaging films including the CT of the abdo/pelvis from 09/07/15 (and reviewed these images with the patient).  I discussed with Sharon Holt and her husband that her disease is recurrent in the peritoneal cavity. Surgery is not indicated at this time. We recommend salvage chemotherapy with carboplatin and paclitaxel and will schedule this with Dr Marko Plume as soon as possible. I discussed that there is a 65% liklihood of successful salvage with this regimen, though long term cure rates are lower (30%).  We will draw CA 125 tomorrow before her appointment with Dr Marko Plume and will monitor this throughout treatment.   I will see her back when she shows disease free status or if she is failing to respond/progressing on therapy.  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  She had a 2 month history of vaginal spotting and saw Dr. Garwin Brothers for her scheduled annual visit in May 2016. A Pap was performed which was positive for AGUS. She then underwent ultrasound evaluation, which revealed a thickened endometrial stripe at 8.8 mm with the uterus itself measured 8 x 5 x 6 cm. An endometrial biopsy was performed on 03/12/2015 which revealed high-grade adenocarcinoma with papillary features architecturally however the immunostains were not typical for  serous adenocarcinoma.  On 04/20/2015, she underwent a Robotic total laparoscopic hysterectomy, bilateral salpingo oophorectomy, bilateral pelvic lymph node dissection, bilateral para-aortic lymph node dissection by Dr. Janie Morning.  On POD 1 she had tachycardia to 120, which was regular to palpate. EKG was ordered which revealed sinus tachycardia, and left ventricular hypertrophy (consistent with her HTN). Her EKG was essentially unchanged compared to preop with the exception of tachycardia. She was saturating at 100% on room air.  She felt anxiety was the cause of her tachycardia. She denied chest pain or SOB.  Final pathology revealed:  1. Lymph nodes, regional resection, right periaortic - SIX BENIGN LYMPH NODES (0/6). 2. Lymph nodes, regional resection, left periaortic - FIVE BENIGN LYMPH NODE (0/5). 3. Lymph nodes, regional resection, right pelvic - SEVEN BENIGN LYMPH NODES (0/7) 4. Lymph nodes, regional resection, left pelvic - EIGHT BENIGN LYMPH NODES (0/8) 5. Uterus and cervix, bilateral tubes and ovaries - ENDOMETRIAL ADENOCARCINOMA INVADING THE SUPERFICIAL MYOMETRIUM. - MARGINS NOT INVOLVED. - RIGHT OVARY: MATURE CYSTIC TERATOMA. - CERVIX, RIGHT AND LEFT OVARIES AND RIGHT AND LEFT FALLOPIAN TUBES FREE OF TUMOR.  We requested that her initial pathology from the biopsy be compared to the postop biopsy due to the descrepancy (grade 2 endometrioid tumor on hysterectomy, high-grade adenocarcinoma with architectural papillary features but immunohistochemicalstaining not typical for serous carcinoma on biopsy ). There was concordance between the reads. Because the larger volume of tumor (the hysterectomy specimen) showed no serous features, we will assume that this is a grade 2 endometrioid lesion, not serous.  She went on to receive vaginal brachytherapy for high/intermediate risk factors in the  uterine pathology.  Interval History:  She was admitted to [facility] on 09/07/15 with  abdominal distension and discomfort. CT abdomen and pelvis on 09/07/15 showed large volume ascites and an omental cake of tumor. There was no apparent chest disease. She underwent paracentesis on 09/08/15 and the fluid was sent for cytology which confirmed metastatic adenocarcinoma.  Review of Systems  Constitutional: Feels fatigued. Very poor appetite, and early satiety.  Cardiovascular: No chest pain, shortness of breath, or edema.  Pulmonary: No cough or wheeze.  Gastrointestinal: No nausea, vomiting, or diarrhea. No bright red blood per rectum or change in bowel movement. + abdominal distension Genitourinary: No frequency, urgency, or dysuria. No vaginal bleeding or discharge.  Musculoskeletal: No myalgia or joint pain. Neurologic: No weakness, numbness, or change in gait.  Psychology: No depression, anxiety, or insomnia.  Current Meds:  Outpatient Encounter Prescriptions as of 09/13/2015  Medication Sig  . atenolol (TENORMIN) 50 MG tablet TAKE 1 TABLET DAILY (Patient taking differently: Take 50 mg by mouth daily. TAKE 1 TABLET DAILY)  . Cholecalciferol (VITAMIN D3) 2000 UNITS capsule Take 2,000 Units by mouth daily.   . clorazepate (TRANXENE) 7.5 MG tablet Take 1 tablet (7.5 mg total) by mouth 2 (two) times daily as needed for anxiety.  . potassium chloride SA (K-DUR,KLOR-CON) 20 MEQ tablet Take 1 tablet (20 mEq total) by mouth daily.  . predniSONE (DELTASONE) 5 MG tablet Take 0.5 tablets (2.5 mg total) by mouth daily.  . raloxifene (EVISTA) 60 MG tablet Take 60 mg by mouth every morning.   . ranitidine (ZANTAC) 150 MG capsule Take 1 capsule (150 mg total) by mouth every evening.  . irbesartan-hydrochlorothiazide (AVALIDE) 300-12.5 MG tablet   . Multiple Vitamins-Minerals (CENTRUM SILVER PO) Take 1 capsule by mouth daily.    No facility-administered encounter medications on file as of 09/13/2015.    Allergy: No Known Allergies  Social Hx:   Social History   Social History  .  Marital Status: Married    Spouse Name: Sharon Holt  . Number of Children: 3  . Years of Education: N/A   Occupational History  . Retired     Company secretary   Social History Main Topics  . Smoking status: Never Smoker   . Smokeless tobacco: Never Used  . Alcohol Use: No  . Drug Use: No  . Sexual Activity: Not on file   Other Topics Concern  . Not on file   Social History Narrative    Past Surgical Hx:  Past Surgical History  Procedure Laterality Date  . Cholecystectomy    . Tonsillectomy    . Breast lumpectomy      right breast BENIGN  . Robotic assisted total hysterectomy with bilateral salpingo oopherectomy Bilateral 04/20/2015    Procedure: ROBOTIC ASSISTED TOTAL HYSTERECTOMY WITH BILATERAL SALPINGO OOPHORECTOMY AND PELVIC AND PERIAORTIC LYMPHADECTOMY;  Surgeon: Janie Morning, MD;  Location: [facility];  Service: Gynecology;  Laterality: Bilateral;    Past Medical Hx:  Past Medical History  Diagnosis Date  . Unspecified essential hypertension   . Mitral valve disorders   . Other and unspecified hyperlipidemia   . Other abnormal glucose   . Anxiety state, unspecified   . Anemia, unspecified   . 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 Transfusion history     "back in my 20's"  . 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 Osteoporosis   .  Endometrial cancer ([place]) 04/2015    serous adenocarcinoma  . History of brachytherapy 06/01/15, 06/10/15, 06/17/15, 06/24/15, 07/01/15    vaginal cuff 27.5 gray    Family Hx:  Family History  Problem Relation Age of Onset  . Prostate cancer Brother     dx. 4s  . Stroke Father   . Breast cancer Sister     dx. 64-65  . Dementia Brother   . Breast cancer Sister 32    mastectomy  . Leukemia Sister     dx. 69s-70s    Vitals:  Blood pressure 120/69, pulse 108, temperature 98.6 F (37 C), temperature source Oral, resp. rate 19, height 5\' 5"  (1.651 m), weight 183 lb 1.6 oz  (83.054 kg), SpO2 100 %.  Physical Exam:  General: Well developed, well nourished female in no acute distress. Alert and oriented x 3.  Cardiovascular: Regular rate and rhythm. S1 and S2 normal.  Lungs: Clear to auscultation bilaterally. No wheezes/crackles/rhonchi noted.  Skin: No rashes or lesions present. Back: No CVA tenderness.  Gyn: deferred Abdomen: Abdomen soft, non-tender , + distended and obese. Active bowel sounds in all quadrants. Incisions clean, dry and in tact. Extremities: No bilateral cyanosis, edema, or clubbing.   I spent 25 minutes in face to face counseling reviewing her case, her images, her pathology and treatment plan including prognosis. [name] 09/13/2015, 12:30 PM

## 2015-09-13 NOTE — Patient Instructions (Signed)
Plan to meet with Dr. Evlyn Clines tomorrow at the [facility] with lab work before.  Arrive at 9:30 to get registered and have your labs drawn before seeing her.  Please call for any questions or concerns.  Plan to begin carboplatin and taxol chemotherapy.  Carboplatin injection What is this medicine? CARBOPLATIN (KAR boe pla tin) is a chemotherapy drug. It targets fast dividing cells, like cancer cells, and causes these cells to die. This medicine is used to treat ovarian cancer and many other cancers. This medicine may be used for other purposes; ask your health care provider or pharmacist if you have questions. What should I tell my health care provider before I take this medicine? They need to know if you have any of these conditions: -blood disorders -hearing problems -kidney disease -recent or ongoing radiation therapy -an unusual or allergic reaction to carboplatin, cisplatin, other chemotherapy, other medicines, foods, dyes, or preservatives -pregnant or trying to get pregnant -breast-feeding How should I use this medicine? This drug is usually given as an infusion into a vein. It is administered in a hospital or clinic by a specially trained health care professional. Talk to your pediatrician regarding the use of this medicine in children. Special care may be needed. Overdosage: If you think you have taken too much of this medicine contact a poison control center or emergency room at once. NOTE: This medicine is only for you. Do not share this medicine with others. What if I miss a dose? It is important not to miss a dose. Call your doctor or health care professional if you are unable to keep an appointment. What may interact with this medicine? -medicines for seizures -medicines to increase blood counts like filgrastim, pegfilgrastim, sargramostim -some antibiotics like amikacin, gentamicin, neomycin, streptomycin, tobramycin -vaccines Talk to your doctor or health care  professional before taking any of these medicines: -acetaminophen -aspirin -ibuprofen -ketoprofen -naproxen This list may not describe all possible interactions. Give your health care provider a list of all the medicines, herbs, non-prescription drugs, or dietary supplements you use. Also tell them if you smoke, drink alcohol, or use illegal drugs. Some items may interact with your medicine. What should I watch for while using this medicine? Your condition will be monitored carefully while you are receiving this medicine. You will need important blood work done while you are taking this medicine. This drug may make you feel generally unwell. This is not uncommon, as chemotherapy can affect healthy cells as well as cancer cells. Report any side effects. Continue your course of treatment even though you feel ill unless your doctor tells you to stop. In some cases, you may be given additional medicines to help with side effects. Follow all directions for their use. Call your doctor or health care professional for advice if you get a fever, chills or sore throat, or other symptoms of a cold or flu. Do not treat yourself. This drug decreases your body's ability to fight infections. Try to avoid being around people who are sick. This medicine may increase your risk to bruise or bleed. Call your doctor or health care professional if you notice any unusual bleeding. Be careful brushing and flossing your teeth or using a toothpick because you may get an infection or bleed more easily. If you have any dental work done, tell your dentist you are receiving this medicine. Avoid taking products that contain aspirin, acetaminophen, ibuprofen, naproxen, or ketoprofen unless instructed by your doctor. These medicines may hide a fever.  Do not become pregnant while taking this medicine. Women should inform their doctor if they wish to become pregnant or think they might be pregnant. There is a potential for serious side  effects to an unborn child. Talk to your health care professional or pharmacist for more information. Do not breast-feed an infant while taking this medicine. What side effects may I notice from receiving this medicine? Side effects that you should report to your doctor or health care professional as soon as possible: -allergic reactions like skin rash, itching or hives, swelling of the face, lips, or tongue -signs of infection - fever or chills, cough, sore throat, pain or difficulty passing urine -signs of decreased platelets or bleeding - bruising, pinpoint red spots on the skin, black, tarry stools, nosebleeds -signs of decreased red blood cells - unusually weak or tired, fainting spells, lightheadedness -breathing problems -changes in hearing -changes in vision -chest pain -high blood pressure -low blood counts - This drug may decrease the number of white blood cells, red blood cells and platelets. You may be at increased risk for infections and bleeding. -nausea and vomiting -pain, swelling, redness or irritation at the injection site -pain, tingling, numbness in the hands or feet -problems with balance, talking, walking -trouble passing urine or change in the amount of urine Side effects that usually do not require medical attention (report to your doctor or health care professional if they continue or are bothersome): -hair loss -loss of appetite -metallic taste in the mouth or changes in taste This list may not describe all possible side effects. Call your doctor for medical advice about side effects. You may report side effects to FDA at 1-800-FDA-1088. Where should I keep my medicine? This drug is given in a hospital or clinic and will not be stored at home. NOTE: This sheet is a summary. It may not cover all possible information. If you have questions about this medicine, talk to your doctor, pharmacist, or health care provider.    2016, [employer]. (2008-01-07  14:38:05)  Paclitaxel injection What is this medicine? PACLITAXEL (PAK li TAX el) is a chemotherapy drug. It targets fast dividing cells, like cancer cells, and causes these cells to die. This medicine is used to treat ovarian cancer, breast cancer, and other cancers. This medicine may be used for other purposes; ask your health care provider or pharmacist if you have questions. What should I tell my health care provider before I take this medicine? They need to know if you have any of these conditions: -blood disorders -irregular heartbeat -infection (especially a virus infection such as chickenpox, cold sores, or herpes) -liver disease -previous or ongoing radiation therapy -an unusual or allergic reaction to paclitaxel, alcohol, polyoxyethylated castor oil, other chemotherapy agents, other medicines, foods, dyes, or preservatives -pregnant or trying to get pregnant -breast-feeding How should I use this medicine? This drug is given as an infusion into a vein. It is administered in a hospital or clinic by a specially trained health care professional. Talk to your pediatrician regarding the use of this medicine in children. Special care may be needed. Overdosage: If you think you have taken too much of this medicine contact a poison control center or emergency room at once. NOTE: This medicine is only for you. Do not share this medicine with others. What if I miss a dose? It is important not to miss your dose. Call your doctor or health care professional if you are unable to keep an appointment. What may interact  with this medicine? Do not take this medicine with any of the following medications: -disulfiram -metronidazole This medicine may also interact with the following medications: -cyclosporine -diazepam -ketoconazole -medicines to increase blood counts like filgrastim, pegfilgrastim, sargramostim -other chemotherapy drugs like cisplatin, doxorubicin, epirubicin, etoposide,  teniposide, vincristine -quinidine -testosterone -vaccines -verapamil Talk to your doctor or health care professional before taking any of these medicines: -acetaminophen -aspirin -ibuprofen -ketoprofen -naproxen This list may not describe all possible interactions. Give your health care provider a list of all the medicines, herbs, non-prescription drugs, or dietary supplements you use. Also tell them if you smoke, drink alcohol, or use illegal drugs. Some items may interact with your medicine. What should I watch for while using this medicine? Your condition will be monitored carefully while you are receiving this medicine. You will need important blood work done while you are taking this medicine. This drug may make you feel generally unwell. This is not uncommon, as chemotherapy can affect healthy cells as well as cancer cells. Report any side effects. Continue your course of treatment even though you feel ill unless your doctor tells you to stop. This medicine can cause serious allergic reactions. To reduce your risk you will need to take other medicine(s) before treatment with this medicine. In some cases, you may be given additional medicines to help with side effects. Follow all directions for their use. Call your doctor or health care professional for advice if you get a fever, chills or sore throat, or other symptoms of a cold or flu. Do not treat yourself. This drug decreases your body's ability to fight infections. Try to avoid being around people who are sick. This medicine may increase your risk to bruise or bleed. Call your doctor or health care professional if you notice any unusual bleeding. Be careful brushing and flossing your teeth or using a toothpick because you may get an infection or bleed more easily. If you have any dental work done, tell your dentist you are receiving this medicine. Avoid taking products that contain aspirin, acetaminophen, ibuprofen, naproxen, or  ketoprofen unless instructed by your doctor. These medicines may hide a fever. Do not become pregnant while taking this medicine. Women should inform their doctor if they wish to become pregnant or think they might be pregnant. There is a potential for serious side effects to an unborn child. Talk to your health care professional or pharmacist for more information. Do not breast-feed an infant while taking this medicine. Men are advised not to father a child while receiving this medicine. This product may contain alcohol. Ask your pharmacist or healthcare provider if this medicine contains alcohol. Be sure to tell all healthcare providers you are taking this medicine. Certain medicines, like metronidazole and disulfiram, can cause an unpleasant reaction when taken with alcohol. The reaction includes flushing, headache, nausea, vomiting, sweating, and increased thirst. The reaction can last from 30 minutes to several hours. What side effects may I notice from receiving this medicine? Side effects that you should report to your doctor or health care professional as soon as possible: -allergic reactions like skin rash, itching or hives, swelling of the face, lips, or tongue -low blood counts - This drug may decrease the number of white blood cells, red blood cells and platelets. You may be at increased risk for infections and bleeding. -signs of infection - fever or chills, cough, sore throat, pain or difficulty passing urine -signs of decreased platelets or bleeding - bruising, pinpoint red spots on the  skin, black, tarry stools, nosebleeds -signs of decreased red blood cells - unusually weak or tired, fainting spells, lightheadedness -breathing problems -chest pain -high or low blood pressure -mouth sores -nausea and vomiting -pain, swelling, redness or irritation at the injection site -pain, tingling, numbness in the hands or feet -slow or irregular heartbeat -swelling of the ankle, feet,  hands Side effects that usually do not require medical attention (report to your doctor or health care professional if they continue or are bothersome): -bone pain -complete hair loss including hair on your head, underarms, pubic hair, eyebrows, and eyelashes -changes in the color of fingernails -diarrhea -loosening of the fingernails -loss of appetite -muscle or joint pain -red flush to skin -sweating This list may not describe all possible side effects. Call your doctor for medical advice about side effects. You may report side effects to FDA at 1-800-FDA-1088. Where should I keep my medicine? This drug is given in a hospital or clinic and will not be stored at home. NOTE: This sheet is a summary. It may not cover all possible information. If you have questions about this medicine, talk to your doctor, pharmacist, or health care provider.    2016, [employer]. (2015-05-20 13:02:56)

## 2015-09-14 ENCOUNTER — Other Ambulatory Visit (HOSPITAL_BASED_OUTPATIENT_CLINIC_OR_DEPARTMENT_OTHER): Payer: Commercial Managed Care - HMO

## 2015-09-14 ENCOUNTER — Telehealth: Payer: Self-pay | Admitting: Oncology

## 2015-09-14 ENCOUNTER — Encounter: Payer: Self-pay | Admitting: Oncology

## 2015-09-14 ENCOUNTER — Ambulatory Visit (HOSPITAL_BASED_OUTPATIENT_CLINIC_OR_DEPARTMENT_OTHER): Payer: Commercial Managed Care - HMO | Admitting: Oncology

## 2015-09-14 VITALS — BP 119/62 | HR 89 | Temp 98.7°F | Resp 18 | Ht 65.0 in | Wt 182.1 lb

## 2015-09-14 DIAGNOSIS — C801 Malignant (primary) neoplasm, unspecified: Secondary | ICD-10-CM

## 2015-09-14 DIAGNOSIS — C541 Malignant neoplasm of endometrium: Secondary | ICD-10-CM

## 2015-09-14 DIAGNOSIS — N289 Disorder of kidney and ureter, unspecified: Secondary | ICD-10-CM | POA: Diagnosis not present

## 2015-09-14 DIAGNOSIS — Z7952 Long term (current) use of systemic steroids: Secondary | ICD-10-CM | POA: Insufficient documentation

## 2015-09-14 DIAGNOSIS — N183 Chronic kidney disease, stage 3 unspecified: Secondary | ICD-10-CM | POA: Insufficient documentation

## 2015-09-14 DIAGNOSIS — R180 Malignant ascites: Secondary | ICD-10-CM

## 2015-09-14 DIAGNOSIS — Z85048 Personal history of other malignant neoplasm of rectum, rectosigmoid junction, and anus: Secondary | ICD-10-CM | POA: Insufficient documentation

## 2015-09-14 DIAGNOSIS — I10 Essential (primary) hypertension: Secondary | ICD-10-CM

## 2015-09-14 LAB — CBC WITH DIFFERENTIAL/PLATELET
BASO%: 0.4 % (ref 0.0–2.0)
Basophils Absolute: 0 10*3/uL (ref 0.0–0.1)
EOS%: 1.5 % (ref 0.0–7.0)
Eosinophils Absolute: 0.1 10*3/uL (ref 0.0–0.5)
HCT: 35 % (ref 34.8–46.6)
HGB: 11.4 g/dL — ABNORMAL LOW (ref 11.6–15.9)
LYMPH%: 12.6 % — AB (ref 14.0–49.7)
MCH: 29.9 pg (ref 25.1–34.0)
MCHC: 32.5 g/dL (ref 31.5–36.0)
MCV: 91.9 fL (ref 79.5–101.0)
MONO#: 0.8 10*3/uL (ref 0.1–0.9)
MONO%: 11.1 % (ref 0.0–14.0)
NEUT%: 74.4 % (ref 38.4–76.8)
NEUTROS ABS: 5.1 10*3/uL (ref 1.5–6.5)
Platelets: 411 10*3/uL — ABNORMAL HIGH (ref 145–400)
RBC: 3.81 10*6/uL (ref 3.70–5.45)
RDW: 12.4 % (ref 11.2–14.5)
WBC: 6.8 10*3/uL (ref 3.9–10.3)
lymph#: 0.9 10*3/uL (ref 0.9–3.3)

## 2015-09-14 LAB — COMPREHENSIVE METABOLIC PANEL (CC13)
ALT: 18 U/L (ref 0–55)
AST: 46 U/L — ABNORMAL HIGH (ref 5–34)
Albumin: 2.3 g/dL — ABNORMAL LOW (ref 3.5–5.0)
Alkaline Phosphatase: 71 U/L (ref 40–150)
Anion Gap: 5 mEq/L (ref 3–11)
BILIRUBIN TOTAL: 0.39 mg/dL (ref 0.20–1.20)
BUN: 24.5 mg/dL (ref 7.0–26.0)
CHLORIDE: 104 meq/L (ref 98–109)
CO2: 26 meq/L (ref 22–29)
CREATININE: 1.6 mg/dL — AB (ref 0.6–1.1)
Calcium: 8.9 mg/dL (ref 8.4–10.4)
EGFR: 36 mL/min/{1.73_m2} — ABNORMAL LOW (ref >90)
GLUCOSE: 127 mg/dL (ref 70–140)
Potassium: 4.9 mEq/L (ref 3.5–5.1)
Sodium: 135 mEq/L — ABNORMAL LOW (ref 136–145)
TOTAL PROTEIN: 6.5 g/dL (ref 6.4–8.3)

## 2015-09-14 NOTE — Telephone Encounter (Signed)
Gave patient avs report and appointments for December  °

## 2015-09-14 NOTE — Progress Notes (Signed)
[facility] NEW PATIENT EVALUATION   Name: Sharon Holt Date: September 14, 2015  MRN: [id number] DOB: [birth year]  REFERRING PHYSICIAN: Everitt Amber cc Plotnikov, [name], MD, Gery Pray, [facility] [name]), [name]   REASON FOR REFERRAL: abdominal carcinomatosis, recent stage I endometrial carcinoma   HISTORY OF PRESENT ILLNESS:Verlie R Purdum is a 74 y.o. 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  Patient has had regular medical care by PCP Dr Alain Marion and gyn Dr Garwin Brothers. Sometime after a fall in spring 2016, she noticed low pelvic discomfort and some intermittent vaginal spotting. She was seen for regular visit by Dr Garwin Brothers in May 2016, with PAP positive for AGUS and US showing endometrial stripe 8.72mm. Endometrial biopsy 03-12-15 showed high grade adenocarcinoma with papillary features, however immunostains were not typical for serous carcinoma. She was seen by [name] in June. CT CAP 04-14-15 showed no evidence of metastatic disease in chest, a 3.3 x 2.8 cm endometrial mas and 3.1 cm cystic lesion of right ovary. She had robotic total laparoscopic hysterectomy BSO with bilateral pelvic and para aortic node dissection by Dr Skeet Latch at [facility] on 04-20-15. Pathology [id number]) grade 2 endometrioid adenocarcinoma invading 0.35 cm where myometrium 1.1 cm, 26 nodes negative and no LVSI. She had intracavitary radiation due to risk for vaginal cuff recurrence from 06-01-15 thru 07-01-15, vaginal cuff 27.5 gray in 5 fractions. There were no obvious concerns when she was seen by [name] in late July or Dr Sondra Come and Dr Alain Marion in  October. She presented to ED on 09-06-15 with generalized abdominal pain, bloating and constipation x 1 month. CT AP 09-06-15 showed extensive nodularity and stranding thruout omentum and large volume ascites, with liver/spleen/pancreas/adrenals not remarkable, no bowel obstruction. She was admitted x 24 hours, with [place] paracentesi on 09-07-15 for 5 liters of serous fluid, cytology ([id number]) adenocarcinoma. SHe was much more comfortable after paracentesis and cannot tell that the fluid has reaccumulated as yet. She had consultation with [name] on 09-13-15, recommendation for systemic chemotherapy.   She has not yet attended chemotherapy education class.  REVIEW OF SYSTEMS: No HA or other neurologic symptoms no environmental allergies, good visual acuity with glasses, no difficulty hearing, no active dental problems and does have dentist tho has not seen him recently. No thyroid disease. Up to date on mammograms (3D) done at [facility] during summer. No persistent GERD tho occasional reflux with coughing, no nausea or vomiting. Bowels move 2-3x daily just after eats, soft stool not diarrhea, this her usual pattern. Appetite recently decreased, is drinking Ensure 2 daily. Bladder ok. No arthritis. No LE swelling. No other bleeding. No peripheral neuropathy. No residual problems from anal cancer or that treatment.  Peripheral IV access not a problem during recent hospitalization that she recalls. Remainder of full 10 point review of systems negative.   ALLERGIES: Review of patient's allergies indicates no known allergies.  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 Zoster right flank remotely, no residual Flu vaccine 08-09-15 Pneumovax 23 and 13 2011 and 2015   CURRENT MEDICATIONS: reviewed as listed now in EMR. Prescriptions to be sent for ativan 0.5 mg q 6 hr prn, zofran 8 mg q 8 hrs prn, decadron 20 mg 12 hrs and 6 hrs prior to taxol, and may want to use claritin starting day of chemo for first ~ 5 days for taxol aches.  PHARMACY: [facility]   SOCIAL HISTORY: Married x 35 years. No tobacco or ETOH. Does not have advance directives but given this information today. 3 children or her own; husband has 2 children. Total 6 grands and 6 great-grands, all live locally.    FAMILY HISTORY:   Father CVA Brother prostate ca 41s Sister died with breast cancer 73 Sister with leukemia 66 HTN, dementia        PHYSICAL EXAM:  height is $RemoveB'5\' 5"'fCoUsDXh$  (1.651 m) and weight is 182 lb 1.6 oz (82.6 kg). Her oral temperature is 98.7 F (37.1 C). Her blood pressure is 119/62 and her pulse is 89. Her respiration is 18 and oxygen saturation is 99%.  Alert, pleasant, cooperative lady looks stated age. Respirations not labored RA.  Husband very supportive.   HEENT:PERRL, not icteric. Oral mucosa moist and clear, posterior pharynx clear. Neck supple without thyroid mass or JVD.  RESPIRATORY:lungs clear to A and P  CARDIAC/ VASCULAR: heart RRR no gallop, clear heart sounds. Peripheral pulses intact and symmetrical  ABDOMEN: distended but not tight, fluid wave present. Not tender. Few BS. Cannot appreciate mass or organomegaly  LYMPH NODES: no cervical, supraclavicular, axillary or inguinal adenopathy BREASTS: bilaterally without dominant mass, skin or nipple finding. Periareolar scar well healed on right.  NEUROLOGIC: speech fluent and appropriate. Fully oriented. CN, motor, sensory,  cerebellar nonfocal  SKIN: without rash, ecchymosis, petechiae  MUSCULOSKELETAL:muscle mass symmetrical, no wasting. Back not tender. No pitting edema, cords, tendernes    LABORATORY DATA:  Results for orders placed or performed in visit on 09/14/15 (from the past 48 hour(s))  CBC with Differential     Status: Abnormal   Collection Time: 09/14/15  9:47 AM  Result Value Ref Range   WBC 6.8 3.9 - 10.3 10e3/uL   NEUT# 5.1 1.5 - 6.5 10e3/uL   HGB 11.4 (L) 11.6 - 15.9 g/dL   HCT 35.0 34.8 - 46.6 %   Platelets 411 (H) 145 - 400 10e3/uL   MCV 91.9 79.5 - 101.0 fL   MCH 29.9 25.1 - 34.0 pg   MCHC 32.5 31.5 - 36.0 g/dL   RBC 3.81 3.70 - 5.45 10e6/uL   RDW 12.4 11.2 - 14.5 %   lymph# 0.9 0.9 - 3.3 10e3/uL   MONO# 0.8 0.1 - 0.9 10e3/uL   Eosinophils Absolute 0.1 0.0 - 0.5 10e3/uL   Basophils Absolute 0.0 0.0 - 0.1 10e3/uL   NEUT% 74.4 38.4 - 76.8 %   LYMPH% 12.6 (L) 14.0 - 49.7 %   MONO% 11.1 0.0 - 14.0 %   EOS% 1.5 0.0 - 7.0 %   BASO% 0.4 0.0 - 2.0 %     CMET available after visit normal with exception of Na 135, creatinine 1.6, ALT 46, alb 2.3 Creatinine had been 1.17 on 09-07-15.   PATHOLOGY:  Musolf, Adasia R  Collected: 04/20/2015 Client: [facility] Accession: [id number] Received: 04/21/2015 Janie Morning, MD REPORT OF SURGICAL PATHOLOGY ADDITIONAL INFORMATION: 5. Mismatch Repair (MMR) Protein Immunohistochemistry (IHC) IHC Expression Result: MLH1: Preserved nuclear expression (greater 50% tumor expression) MSH2: Preserved nuclear expression (greater 50% tumor expression) MSH6: Preserved nuclear expression (greater 50% tumor expression) PMS2: Preserved nuclear expression (greater 50% tumor expression) * Internal control demonstrates intact nuclear expression Interpretation: NORMAL There is preserved expression of the major and minor MMR proteins. There is a very low probability that microsatellite instability (MSI) is present. However, certain clinically  significant MMR protein mutations may result in preservation of nuclear expression. It is recommended that the preservation of protein expression be correlated with molecular based MSI testing. FINAL DIAGNOSIS Diagnosis 1. Lymph nodes, regional resection, right periaortic - SIX BENIGN LYMPH NODES (0/6). 2. Lymph nodes, regional resection, left periaortic  - FIVE BENIGN LYMPH NODE (0/5). 3. Lymph nodes, regional resection, right pelvic - SEVEN BENIGN LYMPH NODES (0/7) 4. Lymph nodes, regional resection, left pelvic - EIGHT BENIGN LYMPH NODES (0/8) 5. Uterus and cervix, bilateral tubes and ovaries - ENDOMETRIAL ADENOCARCINOMA INVADING THE SUPERFICIAL MYOMETRIUM. - MARGINS NOT INVOLVED. - RIGHT OVARY: MATURE CYSTIC TERATOMA. - CERVIX, RIGHT AND LEFT OVARIES AND RIGHT AND LEFT FALLOPIAN TUBES FREE OF TUMOR. Microscopic Comment 5. ONCOLOGY TABLE-UTERUS, CARCINOMA Specimen: Uterus with bilateral ovaries and fallopian tubes, right and left periaortic and right and left pelvic lymph nodes. Procedure: Hysterectomy with bilateral salpingo-oophorectomy and pelvic and aortic lymph node dissection. Lymph node sampling performed: Yes. Specimen integrity: Intact. Maximum tumor size: 1 cm residual tumor. Histologic type: Endometrioid Grade: Grade 2 Myometrial invasion: 0.35 cm where myometrium is 1.1 cm in thickness Cervical stromal involvement: No Extent of involvement of other organs: None detected. Lymph - vascular invasion: No. Lymph nodes: # examined - 36 ; # positive - 0 TNM code: pT1a, pN0 FIGO Stage (based on pathologic findings, needs clinical correlation): IA      RADIOGRAPHY: EXAM: CT ABDOMEN AND PELVIS WITH CONTRAST  09-06-15  TECHNIQUE: Multidetector CT imaging of the abdomen and pelvis was performed using the standard protocol following bolus administration of intravenous contrast.  CONTRAST: 10mL OMNIPAQUE IOHEXOL 300 MG/ML SOLN  COMPARISON:  06/14/2015  FINDINGS: Linear areas of atelectasis in the lung bases bilaterally. Heart is normal size. No effusions.  Prior cholecystectomy. Large volume ascites in the abdomen and pelvis. No focal lesion within the liver, spleen, pancreas, adrenals and kidneys. No hydronephrosis.  Abnormal soft tissue noted within the omentum anteriorly throughout the abdomen and upper pelvis concerning for omental metastatic disease. No evidence of bowel obstruction.  Small bilateral inguinal hernias containing fat. Urinary bladder is unremarkable. Prior hysterectomy and presumed oophorectomy. Aorta is normal caliber.  No acute bony abnormality or focal bone lesion.  IMPRESSION: Extensive nodularity and stranding throughout the omentum anteriorly concerning for metastatic disease. Large volume ascites in the abdomen and pelvis.    DISCUSSION: Prior to visit today, I discussed cytology findings and history with pathologist directly. Immunostains to be added, that information likely available tomorrow.  All of above history reviewed with patient and husband. Certainly imaging is consistent with gyn primary and nothing otherwise in history suggesting another primary, tho this rapid and extensive recurrence would not be expected for stage I grade 2 endometrial cancer. Patient and husband understand that we are checking the pathology as above, but at this point it is most likely that this is progressive endometrial adenocarcinoma. We have discussed extent of disease by CT and  pathophysiology of the malignant ascites; she understands that repeat paracenteses can be done for symptoms until hopefully chemotherapy improves the malignant ascites.  I have explained that these gyn cancers often are very responsive to chemotherapy, with improvement in ascites and symptoms sometimes apparent after 1-2 cycles. We have discussed 6 cycles as usual course, tho can be longer at times. We have discussed repeat  scans depending on clinical response. I have told them that treatment likely will not eradicate the disease, tho may be able to improve and control and is generally thought worth attempting.  We have discussed standard treatment with carboplatin and taxol, either every 3 weeks which she prefers, vs dose dense weekly. Peripheral IV access seems ok to start. We have discussed mechanism of action of chemotherapy in general, outpatient administration, antiemetics, hair loss, cytopenias, nausea, possible taxol aches and neuropathy. I have mentioned gCSF support if needed. They will attend chemotherapy teaching class and hopefully will begin treatment within the week, timing depending on preauthorization and infusion availability.  Verbal consent given for chemotherpay. Chemo orders placed. Will need stat BMET day of chemo with creatinine 1.6 today. Granix may be needed.  Managed care notified for preauthorization.  I will see her back ~ 1 week and ~ 2 weeks after first treatment, with labs  We have also discussed diet, encouraged her to continue Ensure. Patient has asked about PET scans, discussed: would not change plan at present, but may be useful if question of continuing chemotherapy after initial course.  RN to follow up by phone re timing of decadron (20 mg 12 hrs and 6 hrs prior to taxol, each dose with food).    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 Most likely this is associated with endometrial carcinoma, tho this appeared stage 1 grade 2 at [facility] BSO and node evaluation 04-20-15. Immunostains pending on ascites specimen; as long as nothing unexpected with this, she will begin q 3 week carbo taxol in near future. We will let her know about pathology information by phone. Chemo education class this week. Prn paracenteses now. 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  3.squamous carcinoma of anus 2003 not known recurrent since radiation (+chemo?) 4.HTN, elevated lipids: on antihypertensive with HCTZ, may need to adjust if dehydration is problem 5.renal insufficiency, with creatinine 1.6 and EGFR 36 by labs today, for CKD 3 baseline creatinine seems to have been 0.8 - 0.9 several months ago. Likely related to third spacing fluid. Will need careful carboplatin dosing. May need to stop HCTZ. No hydronephrosis by CT with contrast 09-07-15  6. 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  7.post cholecystectomy 8.up to date colonoscopy and mammograms 9.up to date flu vaccine and pneumonia vaccines. 10.information given 09-14-15 for advance directives  Patient and husband have had questions answered to their satisfaction and are in agreement with plan above. They can contact this office for questions or concerns at any time prior to next scheduled visit.  Time spent 70 min, including >50% discussion and coordination of care. Cc Drs Denman George, Kinard, Plotnikov, Gillie Manners, MD 09/14/2015 10:07 AM

## 2015-09-15 ENCOUNTER — Other Ambulatory Visit: Payer: Self-pay | Admitting: Oncology

## 2015-09-15 ENCOUNTER — Other Ambulatory Visit: Payer: Self-pay | Admitting: *Deleted

## 2015-09-15 ENCOUNTER — Telehealth: Payer: Self-pay | Admitting: Oncology

## 2015-09-15 ENCOUNTER — Telehealth: Payer: Self-pay | Admitting: *Deleted

## 2015-09-15 DIAGNOSIS — C541 Malignant neoplasm of endometrium: Secondary | ICD-10-CM

## 2015-09-15 LAB — CA 125: CA 125: 2427 U/mL — AB (ref <35)

## 2015-09-15 MED ORDER — ONDANSETRON HCL 8 MG PO TABS: 8.0000 mg | ORAL_TABLET | Freq: Three times a day (TID) | ORAL | Status: DC | PRN

## 2015-09-15 MED ORDER — DEXAMETHASONE 4 MG PO TABS: ORAL_TABLET | ORAL | Status: DC

## 2015-09-15 MED ORDER — ONDANSETRON HCL 8 MG PO TABS
8.0000 mg | ORAL_TABLET | Freq: Three times a day (TID) | ORAL | Status: DC | PRN
Start: 2015-09-15 — End: 2015-09-15

## 2015-09-15 MED ORDER — LORAZEPAM 0.5 MG PO TABS: ORAL_TABLET | ORAL | Status: DC

## 2015-09-15 NOTE — Telephone Encounter (Signed)
Medical Oncology  Spoke with patient on mobile #, letting her know that the additional pathology information matches with the other information, so plan is correct for chemo as we discussed.  She will have chemo education class on 09-16-15 and knows to call if she has questions or concerns that she wants to discuss with me after that information.  Patient appreciated call  L.Marko Plume, MD

## 2015-09-15 NOTE — Telephone Encounter (Signed)
Medical Oncology  Spoke with Dr Lyndon Code of pathology re results of stains done now on the cytology from ascites.  Findings are somewhat unusual but match workup done at [facility] on the original endometrial biopsy, thus process in malignant ascites is same as the initial findings (which did not correspond to the surgical pathology from hysterectomy).   Godfrey Pick, MD

## 2015-09-15 NOTE — Telephone Encounter (Signed)
Prescriptions sent to patient's pharmacy as noted below by Dr. Marko Plume. While on the phone calling in Ativan, asked pharmacist to remind patient to pickup Claritin. She states she will get a box and place it with her other prescriptions. Called and notified patient that she needs to pickup 3 prescriptions and Claritin at her pharmacy. She states she will pickup medications tomorrow. Notified patient that RN will call her back in the next few days to review how to take medications especially the steroid prior to her first treatment. Patient agreeable to return call and no other concerns noted at this time.

## 2015-09-15 NOTE — Telephone Encounter (Signed)
-----   Message from Gordy Levan, MD sent at 09/14/2015  4:05 PM EST -----   Scripts to pharmacy - generics fine  1.decadron 4 mg   Five tablets with food 12 hrs and 6 hrs before taxol #10 for first cycle  NRF yet  2.ativan 0.5 mg  One SL or po every 6 hrs as needed for nausea. Will make drowsy #20  NRF now  3.zofran 8 mg   One every 8 hrs as needed for nausea, will not make drowsy  #30  2 RF  Also tell pharmacist that she should pick up claritin to use 1 daily x 5 - 7 days starting day of chemo, for taxol aches   RN please call patient shortly before first chemo, to review times of steroids and go over antiemetics. NOTE we also need to let her know results of rest of path information before first chemo  thanks

## 2015-09-16 ENCOUNTER — Encounter: Payer: Self-pay | Admitting: *Deleted

## 2015-09-16 ENCOUNTER — Other Ambulatory Visit: Payer: Commercial Managed Care - HMO

## 2015-09-17 ENCOUNTER — Ambulatory Visit (INDEPENDENT_AMBULATORY_CARE_PROVIDER_SITE_OTHER): Payer: Commercial Managed Care - HMO | Admitting: Internal Medicine

## 2015-09-17 ENCOUNTER — Encounter: Payer: Self-pay | Admitting: Internal Medicine

## 2015-09-17 VITALS — BP 90/68 | HR 82 | Temp 98.6°F | Wt 184.0 lb

## 2015-09-17 DIAGNOSIS — R110 Nausea: Secondary | ICD-10-CM

## 2015-09-17 DIAGNOSIS — K219 Gastro-esophageal reflux disease without esophagitis: Secondary | ICD-10-CM | POA: Insufficient documentation

## 2015-09-17 DIAGNOSIS — R180 Malignant ascites: Secondary | ICD-10-CM

## 2015-09-17 DIAGNOSIS — I10 Essential (primary) hypertension: Secondary | ICD-10-CM | POA: Diagnosis not present

## 2015-09-17 DIAGNOSIS — C799 Secondary malignant neoplasm of unspecified site: Secondary | ICD-10-CM

## 2015-09-17 DIAGNOSIS — C541 Malignant neoplasm of endometrium: Secondary | ICD-10-CM | POA: Diagnosis not present

## 2015-09-17 MED ORDER — SPIRONOLACTONE 50 MG PO TABS: 50.0000 mg | ORAL_TABLET | Freq: Every day | ORAL | Status: DC

## 2015-09-17 MED ORDER — ONDANSETRON HCL 8 MG PO TABS: 4.0000 mg | ORAL_TABLET | Freq: Three times a day (TID) | ORAL | Status: DC | PRN

## 2015-09-17 MED ORDER — ONDANSETRON HCL 4 MG/2ML IJ SOLN
4.0000 mg | Freq: Once | INTRAMUSCULAR | Status: AC
  Administered 2015-09-17: 4 mg via INTRAMUSCULAR

## 2015-09-17 NOTE — Assessment & Plan Note (Signed)
Start Nexium XR qd  Stop  Evista, IRBESARTAN-HCTZ, ATENOLOL

## 2015-09-17 NOTE — Assessment & Plan Note (Signed)
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 Most likely this is associated with endometrial carcinoma, tho this appeared stage 1 grade 2 at [facility] BSO and node evaluation 04-20-15. Immunostains pending on ascites specimen; as long as nothing unexpected with this, she will begin q 3 week carbo taxol in near future. We will let her know about pathology information by phone. Chemo education class this week. Prn paracenteses now. 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

## 2015-09-17 NOTE — Patient Instructions (Signed)
Stop EVISTA, IRBESARTAN-HCTZ, ATENOLOL

## 2015-09-17 NOTE — Progress Notes (Signed)
Pre visit review using our clinic review tool, if applicable. No additional management support is needed unless otherwise documented below in the visit note. 

## 2015-09-17 NOTE — Assessment & Plan Note (Signed)
11/16 low BP:  Stop  IRBESARTAN-HCTZ, ATENOLOL

## 2015-09-17 NOTE — Progress Notes (Signed)
Subjective:  Patient ID: Sharon Holt, female    DOB: 1941-01-02  Age: 74 y.o. MRN: [id number]  CC: No chief complaint on file.   HPI Sharon Holt presents for post-hosp f/u for ascitis:   Admit date: 09/06/2015 Discharge date: 09/07/2015  Time spent: 25 minutes  Recommendations for Outpatient Follow-up:  #1 Discharge home with outpatient follow-up with Dr. Everitt Amber in 1 week. Please follow peritoneal fluid cytology. #2 Follow-up with PCP in one week.. Monitoring of function and if stable may resume HCTZ-irbesartan.   Discharge Diagnoses:  Principal Problem:  Abdominal distension   Active Problems:  Dyslipidemia  Essential hypertension  Abdominal pain  Metastatic disease ([place])  Ascites  Acute kidney injury ([place])  Peritoneal carcinomatosis ([place])  constipation   Discharge Condition: Fair  Diet recommendation: Low sodium  There were no vitals filed for this visit.  History of present illness:  Please refer to admission H&P for details, in brief, 74 year old female with hypertension, anxiety, hyperlipidemia, Recent diagnosis of serous endometrial carcinoma, stage I grade 2 ([name]), underwent total hysterectomy and radiation treatments presented to ED with abdominal distention with intermittent nausea for last 1 month. Patient reported that she has been having abdominal bloating, constipation for almost a month and was seen by her PCP several times. She used laxatives and Pepto-Bismol however with no significant improvement. Last bowel movement was the day prior to admission. Since bloating was persistent she came to the ED. she denied any fevers or chills, any vomiting, hematochezia or melena. She denied any exertional dyspnea or gain of weight except abdominal bloating. CT of the abdomen and pelvis done in ED showed extensive nodularity and stranding throughout the omentum anteriorly concerning for metastatic disease, large volume ascites  in the abdomen and pelvis. CBC, BMET otherwise unremarkable except creatinine slightly elevated at 1.29, last creatinine 0.91 and on 10/24. Admitted to hospitalist service and oncology consulted.  Hospital Course:  Abdominal distention secondary to ascites with peritoneal carcinomatosis in the setting of underlying endometrial cancer (newly diagnosed) Patient kept nothing by mouth and underwent paracentesis with 5 L serous fluid drained. Sent for fluid analysis and cytology. Abdominal bloating and pain symptoms have resolved after paracentesis. Cell count negative for SBP. Seen by oncologist Dr. Jana Hakim who has discussed with Dr. Denman George who informed him that she would see the patient later in the evening today.  This patient's symptoms have resolved after paracentesis she can be discharged home and follow-up with Dr. Denman George next week. Cytology should be available by then. Patient has been instructed to return to the ED if she has similar symptoms abdominal distention with/ without pain or shortness of breath.  Acute kidney injury Mild. Holding HCTZ and irbesartan. Renal function improving this morning. We'll hold off on these medications until follow-up with PCP in one week and have her renal function checked.   Essential hypertension Stable. Continue atenolol.  Anxiety Continue clorazepate  CODE STATUS: DO NOT RESUSCITATE Family communication: Husband and daughter at bedside Disposition: Home with outpatient GYN oncology follow-up  Procedures:   Ultrasound paracentesis  CT abdomen and pelvis  Consultations:  [name]  IR  Discharge Exam: Filed Vitals:   09/07/15 0948 09/07/15 1033  BP: 124/59 114/68  Pulse:  102  Temp:    Resp:      General: Elderly female not in distress HEENT: No pallor, moist oral mucosa Chest: Clear to auscultation bilaterally CVS: Normal S1 and S2, no murmurs  GI: Soft, nondistended, nontender, bowel  sounds  present Musculoskeletal: Warm, no edema  Discharge Instructions  CONTINUE these medications which have NOT CHANGED   Details  atenolol (TENORMIN) 50 MG tablet TAKE 1 TABLET DAILY Qty: 90 tablet, Refills: 3    Cholecalciferol (VITAMIN D3) 2000 UNITS capsule Take 2,000 Units by mouth daily.     clorazepate (TRANXENE) 7.5 MG tablet Take 1 tablet (7.5 mg total) by mouth 2 (two) times daily as needed for anxiety. Qty: 180 tablet, Refills: 1    Multiple Vitamins-Minerals (CENTRUM SILVER PO) Take 1 capsule by mouth daily.     potassium chloride SA (K-DUR,KLOR-CON) 20 MEQ tablet Take 1 tablet (20 mEq total) by mouth daily. Qty: 90 tablet, Refills: 3    predniSONE (DELTASONE) 5 MG tablet Take 0.5 tablets (2.5 mg total) by mouth daily. Qty: 45 tablet, Refills: 5    raloxifene (EVISTA) 60 MG tablet Take 60 mg by mouth every morning.     ranitidine (ZANTAC) 150 MG capsule Take 1 capsule (150 mg total) by mouth every evening. Qty: 30 capsule, Refills: 0   Associated Diagnoses: Bloating      STOP taking these medications     irbesartan-hydrochlorothiazide (AVALIDE) 300-12.5 MG per tablet      loperamide (IMODIUM A-D) 2 MG tablet      ibuprofen (ADVIL,MOTRIN) 800 MG tablet                           No Known Allergies Follow-up Information    Follow up with Walker Kehr, MD. Schedule an appointment as soon as possible for a visit in 1 week.   Specialty: Internal Medicine   Contact information:   [place] [address] [phone]       Follow up with [name], MD. Schedule an appointment as soon as possible for a visit in 1 week.   Specialty: Obstetrics and Gynecology   Contact information:   [address] [place] [address] [phone]        C/o abd distension and nausea - worse. C/o GERD sx's  Outpatient  Prescriptions Prior to Visit  Medication Sig Dispense Refill  . Cholecalciferol (VITAMIN D3) 2000 UNITS capsule Take 2,000 Units by mouth daily.     . clorazepate (TRANXENE) 7.5 MG tablet Take 1 tablet (7.5 mg total) by mouth 2 (two) times daily as needed for anxiety. 180 tablet 1  . dexamethasone (DECADRON) 4 MG tablet Take 5 tablets by mouth with food 12 hours and 6 hours prior to taxol chemotherapy. 10 tablet 0  . Multiple Vitamins-Minerals (CENTRUM SILVER PO) Take 1 capsule by mouth daily.     . potassium chloride SA (K-DUR,KLOR-CON) 20 MEQ tablet Take 1 tablet (20 mEq total) by mouth daily. 90 tablet 3  . atenolol (TENORMIN) 50 MG tablet TAKE 1 TABLET DAILY (Patient taking differently: Take 50 mg by mouth daily. TAKE 1 TABLET DAILY) 90 tablet 3  . irbesartan-hydrochlorothiazide (AVALIDE) 300-12.5 MG tablet Take 1 tablet by mouth daily.     Marland Kitchen LORazepam (ATIVAN) 0.5 MG tablet Take 1 tablet by mouth or place under tongue to dissolve every 6 hours as needed for nausea. Will make drowsy. 20 tablet 0  . ondansetron (ZOFRAN) 8 MG tablet Take 1 tablet (8 mg total) by mouth every 8 (eight) hours as needed for nausea or vomiting. Will NOT make drowsy. 30 tablet 2  . predniSONE (DELTASONE) 5 MG tablet Take 0.5 tablets (2.5 mg total) by mouth  daily. 45 tablet 5  . raloxifene (EVISTA) 60 MG tablet Take 60 mg by mouth every morning.     . ranitidine (ZANTAC) 150 MG capsule Take 1 capsule (150 mg total) by mouth every evening. 30 capsule 0   No facility-administered medications prior to visit.    ROS Review of Systems  Constitutional: Positive for fatigue and unexpected weight change. Negative for fever, chills, activity change and appetite change.  HENT: Negative for congestion, mouth sores and sinus pressure.   Eyes: Negative for visual disturbance.  Respiratory: Negative for cough and chest tightness.   Gastrointestinal: Positive for nausea and abdominal distention. Negative for vomiting, abdominal  pain, diarrhea and anal bleeding.  Genitourinary: Negative for frequency, difficulty urinating and vaginal pain.  Musculoskeletal: Negative for back pain and gait problem.  Skin: Negative for pallor and rash.  Neurological: Positive for weakness. Negative for dizziness, tremors, numbness and headaches.  Psychiatric/Behavioral: Negative for confusion and sleep disturbance. The patient is not nervous/anxious.     Objective:  BP 90/68 mmHg  Pulse 82  Temp(Src) 98.6 F (37 C) (Oral)  Wt 184 lb (83.462 kg)  SpO2 94%  BP Readings from Last 3 Encounters:  09/17/15 90/68  09/14/15 119/62  09/13/15 120/69    Wt Readings from Last 3 Encounters:  09/17/15 184 lb (83.462 kg)  09/14/15 182 lb 1.6 oz (82.6 kg)  09/13/15 183 lb 1.6 oz (83.054 kg)    Physical Exam  Constitutional: She appears well-developed. No distress.  HENT:  Head: Normocephalic.  Right Ear: External ear normal.  Left Ear: External ear normal.  Nose: Nose normal.  Mouth/Throat: Oropharynx is clear and moist.  Eyes: Conjunctivae are normal. Pupils are equal, round, and reactive to light. Right eye exhibits no discharge. Left eye exhibits no discharge.  Neck: Normal range of motion. Neck supple. No JVD present. No tracheal deviation present. No thyromegaly present.  Cardiovascular: Normal rate, regular rhythm and normal heart sounds.   Pulmonary/Chest: No stridor. No respiratory distress. She has no wheezes.  Abdominal: Soft. Bowel sounds are normal. She exhibits distension. She exhibits no mass. There is no tenderness. There is no rebound and no guarding.  Musculoskeletal: She exhibits no edema or tenderness.  Lymphadenopathy:    She has no cervical adenopathy.  Neurological: She displays normal reflexes. No cranial nerve deficit. She exhibits normal muscle tone. Coordination normal.  Skin: No rash noted. No erythema.  Psychiatric: She has a normal mood and affect. Her behavior is normal. Judgment and thought content  normal.  Large abd w/ascitis - soft, NT  Lab Results  Component Value Date   WBC 6.8 09/14/2015   HGB 11.4* 09/14/2015   HCT 35.0 09/14/2015   PLT 411* 09/14/2015   GLUCOSE 127 09/14/2015   CHOL 185 09/25/2014   TRIG 86.0 09/25/2014   HDL 63.70 09/25/2014   LDLDIRECT 100.6 02/13/2012   LDLCALC 104* 09/25/2014   ALT 18 09/14/2015   AST 46* 09/14/2015   NA 135* 09/14/2015   K 4.9 09/14/2015   CL 102 09/07/2015   CREATININE 1.6* 09/14/2015   BUN 24.5 09/14/2015   CO2 26 09/14/2015   TSH 1.35 08/09/2015   HGBA1C 5.4 02/13/2013    Ct Abdomen Pelvis W Contrast  09/06/2015  CLINICAL DATA:  Upper abdominal pain, distention. History of endometrial cancer and prior surgery and radiation. EXAM: CT ABDOMEN AND PELVIS WITH CONTRAST TECHNIQUE: Multidetector CT imaging of the abdomen and pelvis was performed using the standard protocol following bolus administration of  intravenous contrast. CONTRAST:  62mL OMNIPAQUE IOHEXOL 300 MG/ML  SOLN COMPARISON:  06/14/2015 FINDINGS: Linear areas of atelectasis in the lung bases bilaterally. Heart is normal size. No effusions. Prior cholecystectomy. Large volume ascites in the abdomen and pelvis. No focal lesion within the liver, spleen, pancreas, adrenals and kidneys. No hydronephrosis. Abnormal soft tissue noted within the omentum anteriorly throughout the abdomen and upper pelvis concerning for omental metastatic disease. No evidence of bowel obstruction. Small bilateral inguinal hernias containing fat. Urinary bladder is unremarkable. Prior hysterectomy and presumed oophorectomy. Aorta is normal caliber. No acute bony abnormality or focal bone lesion. IMPRESSION: Extensive nodularity and stranding throughout the omentum anteriorly concerning for metastatic disease. Large volume ascites in the abdomen and pelvis. Electronically Signed   By: Rolm Baptise M.D.   On: 09/06/2015 15:53   US Paracentesis  09/07/2015  INDICATION: History of endometrial cancer,  ascites and request for paracentesis. EXAM: ULTRASOUND-GUIDED PARACENTESIS COMPARISON:  CT Abdomen/Pelvis 09/06/2015. MEDICATIONS: None. COMPLICATIONS: None immediate TECHNIQUE: Informed written consent was obtained from the patient after a discussion of the risks, benefits and alternatives to treatment. A timeout was performed prior to the initiation of the procedure. Initial ultrasound scanning demonstrates a large amount of ascites within the right lower abdominal quadrant. The right lower abdomen was prepped and draped in the usual sterile fashion. 1% lidocaine was used for local anesthesia. Under direct ultrasound guidance, a 19 gauge, 10-cm, Yueh catheter was introduced. An ultrasound image was saved for documentation purposed. The paracentesis was performed. The catheter was removed and a dressing was applied. The patient tolerated the procedure well without immediate post procedural complication. FINDINGS: A total of approximately 5 liters of serous fluid was removed. Samples were sent to the laboratory as requested by the clinical team. IMPRESSION: Successful ultrasound-guided paracentesis yielding 5 liters of peritoneal fluid. Read By:  Tsosie Billing PA-C Electronically Signed   By: Jacqulynn Cadet M.D.   On: 09/07/2015 12:31    Assessment & Plan:   Diagnoses and all orders for this visit:  Malignant neoplasm of endometrium ([place]) -     ondansetron (ZOFRAN) 8 MG tablet; Take 0.5-1 tablets (4-8 mg total) by mouth every 8 (eight) hours as needed for nausea, vomiting or refractory nausea / vomiting. Will NOT make drowsy.  Malignant ascites  Metastatic disease ([place])  Essential hypertension  Gastroesophageal reflux disease without esophagitis  Nausea without vomiting -     ondansetron (ZOFRAN) injection 4 mg; Inject 2 mLs (4 mg total) into the muscle once.  Other orders -     spironolactone (ALDACTONE) 50 MG tablet; Take 1 tablet (50 mg total) by mouth daily.  I have discontinued Ms.  Silversmith's raloxifene, predniSONE, atenolol, ranitidine, irbesartan-hydrochlorothiazide, and LORazepam. I have also changed her ondansetron. Additionally, I am having her start on spironolactone. Lastly, I am having her maintain her Multiple Vitamins-Minerals (CENTRUM SILVER PO), Vitamin D3, potassium chloride SA, clorazepate, and dexamethasone. We administered ondansetron.  Meds ordered this encounter  Medications  . spironolactone (ALDACTONE) 50 MG tablet    Sig: Take 1 tablet (50 mg total) by mouth daily.    Dispense:  30 tablet    Refill:  11  . ondansetron (ZOFRAN) 8 MG tablet    Sig: Take 0.5-1 tablets (4-8 mg total) by mouth every 8 (eight) hours as needed for nausea, vomiting or refractory nausea / vomiting. Will NOT make drowsy.    Dispense:  30 tablet    Refill:  2  . ondansetron (ZOFRAN) injection 4  mg    Sig:      Follow-up: Return in about 6 weeks (around 10/29/2015) for a follow-up visit.  Walker Kehr, MD

## 2015-09-17 NOTE — Telephone Encounter (Signed)
Reviewed times to take steroids Monday 09-20-15 night at 10 pm and 0400 09-21-15 with food. Reviewed use of antiemetics with patient.  She verbalized understanding.

## 2015-09-17 NOTE — Assessment & Plan Note (Addendum)
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 Most likely this is associated with endometrial carcinoma, tho this appeared stage 1 grade 2 at [facility] BSO and node evaluation 04-20-15. Immunostains pending on ascites specimen; as long as nothing unexpected with this, she will begin q 3 week carbo taxol in near future. We will let her know about pathology information by phone. Chemo education class this week. Prn paracenteses now. 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   Appt w/Onc on Tue May need a guided paracenthesis Start Spironolactone

## 2015-09-19 ENCOUNTER — Encounter: Payer: Self-pay | Admitting: Internal Medicine

## 2015-09-20 ENCOUNTER — Ambulatory Visit (HOSPITAL_COMMUNITY)
Admission: RE | Admit: 2015-09-20 | Discharge: 2015-09-20 | Disposition: A | Discharge status: Home / Self Care | Payer: Commercial Managed Care - HMO | Source: Ambulatory Visit | Attending: Oncology | Admitting: Oncology

## 2015-09-20 ENCOUNTER — Other Ambulatory Visit: Payer: Self-pay | Admitting: Oncology

## 2015-09-20 ENCOUNTER — Telehealth: Payer: Self-pay | Admitting: *Deleted

## 2015-09-20 DIAGNOSIS — R180 Malignant ascites: Secondary | ICD-10-CM

## 2015-09-20 DIAGNOSIS — C541 Malignant neoplasm of endometrium: Secondary | ICD-10-CM | POA: Insufficient documentation

## 2015-09-20 NOTE — Telephone Encounter (Signed)
Patient left voicemail stating that she felt "sick on the stomach" and she would need a paracentesis due to fluid on abdomen. Please advise. Message sent to MD Merilyn Baba.

## 2015-09-20 NOTE — Telephone Encounter (Signed)
Have called radiology and waiting for call back.

## 2015-09-20 NOTE — Procedures (Signed)
Successful US guided paracentesis from RLQ.  Yielded 5.2 liters of serous colored fluid.  No immediate complications.  Pt tolerated well.   Specimen was not sent for labs.  Tsosie Billing D PA-C 09/20/2015 2:27 PM

## 2015-09-20 NOTE — Telephone Encounter (Signed)
Please set up therapeutic US paracentesis today if possible, as she has first chemo on 12-6 and likely will tolerate treatment better if paracentesis first. I have put order in, if you could follow up with central radiology scheduling and try to get it today if possible Otherwise next available  Thanks Lennis

## 2015-09-20 NOTE — Telephone Encounter (Signed)
S/w pt to let her know she has a 130 appt for therapeutic paracentesis. Directed her to come to radiology and they can direct her where to go.

## 2015-09-21 ENCOUNTER — Other Ambulatory Visit (HOSPITAL_BASED_OUTPATIENT_CLINIC_OR_DEPARTMENT_OTHER): Payer: Commercial Managed Care - HMO

## 2015-09-21 ENCOUNTER — Other Ambulatory Visit: Payer: Self-pay | Admitting: Oncology

## 2015-09-21 ENCOUNTER — Other Ambulatory Visit: Payer: Self-pay

## 2015-09-21 ENCOUNTER — Ambulatory Visit (HOSPITAL_BASED_OUTPATIENT_CLINIC_OR_DEPARTMENT_OTHER): Payer: Commercial Managed Care - HMO

## 2015-09-21 VITALS — BP 145/71 | HR 125 | Temp 98.9°F | Resp 18

## 2015-09-21 DIAGNOSIS — C801 Malignant (primary) neoplasm, unspecified: Secondary | ICD-10-CM

## 2015-09-21 DIAGNOSIS — R180 Malignant ascites: Secondary | ICD-10-CM

## 2015-09-21 DIAGNOSIS — C541 Malignant neoplasm of endometrium: Secondary | ICD-10-CM

## 2015-09-21 DIAGNOSIS — C211 Malignant neoplasm of anal canal: Secondary | ICD-10-CM

## 2015-09-21 DIAGNOSIS — C786 Secondary malignant neoplasm of retroperitoneum and peritoneum: Secondary | ICD-10-CM

## 2015-09-21 DIAGNOSIS — Z5111 Encounter for antineoplastic chemotherapy: Secondary | ICD-10-CM | POA: Diagnosis not present

## 2015-09-21 LAB — COMPREHENSIVE METABOLIC PANEL
ALBUMIN: 2.5 g/dL — AB (ref 3.5–5.0)
ALK PHOS: 75 U/L (ref 40–150)
ALT: 25 U/L (ref 0–55)
AST: 48 U/L — AB (ref 5–34)
Anion Gap: 9 mEq/L (ref 3–11)
BUN: 18 mg/dL (ref 7.0–26.0)
CALCIUM: 9.3 mg/dL (ref 8.4–10.4)
CO2: 21 mEq/L — ABNORMAL LOW (ref 22–29)
CREATININE: 1.2 mg/dL — AB (ref 0.6–1.1)
Chloride: 102 mEq/L (ref 98–109)
EGFR: 51 mL/min/{1.73_m2} — ABNORMAL LOW (ref >90)
Glucose: 180 mg/dl — ABNORMAL HIGH (ref 70–140)
Potassium: 5 mEq/L (ref 3.5–5.1)
Sodium: 132 mEq/L — ABNORMAL LOW (ref 136–145)
TOTAL PROTEIN: 7.4 g/dL (ref 6.4–8.3)
Total Bilirubin: 0.31 mg/dL (ref 0.20–1.20)

## 2015-09-21 LAB — CBC WITH DIFFERENTIAL/PLATELET
BASO%: 0 % (ref 0.0–2.0)
BASOS ABS: 0 10*3/uL (ref 0.0–0.1)
EOS%: 0 % (ref 0.0–7.0)
Eosinophils Absolute: 0 10*3/uL (ref 0.0–0.5)
HEMATOCRIT: 38.4 % (ref 34.8–46.6)
HEMOGLOBIN: 12.7 g/dL (ref 11.6–15.9)
LYMPH#: 0.4 10*3/uL — AB (ref 0.9–3.3)
LYMPH%: 3.7 % — ABNORMAL LOW (ref 14.0–49.7)
MCH: 30.3 pg (ref 25.1–34.0)
MCHC: 33 g/dL (ref 31.5–36.0)
MCV: 91.9 fL (ref 79.5–101.0)
MONO#: 0.1 10*3/uL (ref 0.1–0.9)
MONO%: 1 % (ref 0.0–14.0)
NEUT#: 10 10*3/uL — ABNORMAL HIGH (ref 1.5–6.5)
NEUT%: 95.3 % — ABNORMAL HIGH (ref 38.4–76.8)
Platelets: 452 10*3/uL — ABNORMAL HIGH (ref 145–400)
RBC: 4.18 10*6/uL (ref 3.70–5.45)
RDW: 12.2 % (ref 11.2–14.5)
WBC: 10.4 10*3/uL — ABNORMAL HIGH (ref 3.9–10.3)

## 2015-09-21 MED ORDER — SODIUM CHLORIDE 0.9 % IV SOLN
Freq: Once | INTRAVENOUS | Status: AC
  Administered 2015-09-21: 11:00:00 via INTRAVENOUS

## 2015-09-21 MED ORDER — SODIUM CHLORIDE 0.9 % IV SOLN
Freq: Once | INTRAVENOUS | Status: AC
  Administered 2015-09-21: 11:00:00 via INTRAVENOUS
  Filled 2015-09-21: qty 8

## 2015-09-21 MED ORDER — FAMOTIDINE IN NACL 20-0.9 MG/50ML-% IV SOLN
INTRAVENOUS | Status: AC
  Filled 2015-09-21: qty 50

## 2015-09-21 MED ORDER — LORAZEPAM 2 MG/ML IJ SOLN
0.5000 mg | Freq: Once | INTRAMUSCULAR | Status: AC
  Administered 2015-09-21: 0.5 mg via INTRAVENOUS

## 2015-09-21 MED ORDER — DIPHENHYDRAMINE HCL 50 MG/ML IJ SOLN
INTRAMUSCULAR | Status: AC
  Filled 2015-09-21: qty 1

## 2015-09-21 MED ORDER — SODIUM CHLORIDE 0.9 % IV SOLN
317.6000 mg | Freq: Once | INTRAVENOUS | Status: AC
  Administered 2015-09-21: 320 mg via INTRAVENOUS
  Filled 2015-09-21: qty 32

## 2015-09-21 MED ORDER — DIPHENHYDRAMINE HCL 50 MG/ML IJ SOLN
50.0000 mg | Freq: Once | INTRAMUSCULAR | Status: AC
Start: 2015-09-21 — End: 2015-09-21
  Administered 2015-09-21: 50 mg via INTRAVENOUS

## 2015-09-21 MED ORDER — FAMOTIDINE IN NACL 20-0.9 MG/50ML-% IV SOLN
20.0000 mg | Freq: Once | INTRAVENOUS | Status: AC
  Administered 2015-09-21: 20 mg via INTRAVENOUS

## 2015-09-21 MED ORDER — LORAZEPAM 2 MG/ML IJ SOLN
INTRAMUSCULAR | Status: AC
  Filled 2015-09-21: qty 1

## 2015-09-21 MED ORDER — PACLITAXEL CHEMO INJECTION 300 MG/50ML
175.0000 mg/m2 | Freq: Once | INTRAVENOUS | Status: AC
  Administered 2015-09-21: 342 mg via INTRAVENOUS
  Filled 2015-09-21: qty 57

## 2015-09-21 NOTE — Patient Instructions (Addendum)
[facility] Discharge Instructions for Patients Receiving Chemotherapy  Today you received the following chemotherapy agents Taxol and Carboplatin  To help prevent nausea and vomiting after your treatment, we encourage you to take your nausea medication as directed.   If you develop nausea and vomiting that is not controlled by your nausea medication, call the clinic.   BELOW ARE SYMPTOMS THAT SHOULD BE REPORTED IMMEDIATELY:  *FEVER GREATER THAN 100.5 F  *CHILLS WITH OR WITHOUT FEVER  NAUSEA AND VOMITING THAT IS NOT CONTROLLED WITH YOUR NAUSEA MEDICATION  *UNUSUAL SHORTNESS OF BREATH  *UNUSUAL BRUISING OR BLEEDING  TENDERNESS IN MOUTH AND THROAT WITH OR WITHOUT PRESENCE OF ULCERS  *URINARY PROBLEMS  *BOWEL PROBLEMS  UNUSUAL RASH Items with * indicate a potential emergency and should be followed up as soon as possible.  Feel free to call the clinic you have any questions or concerns. The clinic phone number is [phone].  Please show the CHEMO ALERT CARD at check-in to the Emergency Department and triage nurse.      Paclitaxel injection What is this medicine? PACLITAXEL (PAK li TAX el) is a chemotherapy drug. It targets fast dividing cells, like cancer cells, and causes these cells to die. This medicine is used to treat ovarian cancer, breast cancer, and other cancers. This medicine may be used for other purposes; ask your health care provider or pharmacist if you have questions. What should I tell my health care provider before I take this medicine? They need to know if you have any of these conditions: -blood disorders -irregular heartbeat -infection (especially a virus infection such as chickenpox, cold sores, or herpes) -liver disease -previous or ongoing radiation therapy -an unusual or allergic reaction to paclitaxel, alcohol, polyoxyethylated castor oil, other chemotherapy agents, other medicines, foods, dyes, or preservatives -pregnant or  trying to get pregnant -breast-feeding How should I use this medicine? This drug is given as an infusion into a vein. It is administered in a hospital or clinic by a specially trained health care professional. Talk to your pediatrician regarding the use of this medicine in children. Special care may be needed. Overdosage: If you think you have taken too much of this medicine contact a poison control [facility] or emergency room at once. NOTE: This medicine is only for you. Do not share this medicine with others. What if I miss a dose? It is important not to miss your dose. Call your doctor or health care professional if you are unable to keep an appointment. What may interact with this medicine? Do not take this medicine with any of the following medications: -disulfiram -metronidazole This medicine may also interact with the following medications: -cyclosporine -diazepam -ketoconazole -medicines to increase blood counts like filgrastim, pegfilgrastim, sargramostim -other chemotherapy drugs like cisplatin, doxorubicin, epirubicin, etoposide, teniposide, vincristine -quinidine -testosterone -vaccines -verapamil Talk to your doctor or health care professional before taking any of these medicines: -acetaminophen -aspirin -ibuprofen -ketoprofen -naproxen This list may not describe all possible interactions. Give your health care provider a list of all the medicines, herbs, non-prescription drugs, or dietary supplements you use. Also tell them if you smoke, drink alcohol, or use illegal drugs. Some items may interact with your medicine. What should I watch for while using this medicine? Your condition will be monitored carefully while you are receiving this medicine. You will need important blood work done while you are taking this medicine. This drug may make you feel generally unwell. This is not uncommon, as chemotherapy can affect   healthy cells as well as cancer cells. Report any side  effects. Continue your course of treatment even though you feel ill unless your doctor tells you to stop. This medicine can cause serious allergic reactions. To reduce your risk you will need to take other medicine(s) before treatment with this medicine. In some cases, you may be given additional medicines to help with side effects. Follow all directions for their use. Call your doctor or health care professional for advice if you get a fever, chills or sore throat, or other symptoms of a cold or flu. Do not treat yourself. This drug decreases your body's ability to fight infections. Try to avoid being around people who are sick. This medicine may increase your risk to bruise or bleed. Call your doctor or health care professional if you notice any unusual bleeding. Be careful brushing and flossing your teeth or using a toothpick because you may get an infection or bleed more easily. If you have any dental work done, tell your dentist you are receiving this medicine. Avoid taking products that contain aspirin, acetaminophen, ibuprofen, naproxen, or ketoprofen unless instructed by your doctor. These medicines may hide a fever. Do not become pregnant while taking this medicine. Women should inform their doctor if they wish to become pregnant or think they might be pregnant. There is a potential for serious side effects to an unborn child. Talk to your health care professional or pharmacist for more information. Do not breast-feed an infant while taking this medicine. Men are advised not to father a child while receiving this medicine. This product may contain alcohol. Ask your pharmacist or healthcare provider if this medicine contains alcohol. Be sure to tell all healthcare providers you are taking this medicine. Certain medicines, like metronidazole and disulfiram, can cause an unpleasant reaction when taken with alcohol. The reaction includes flushing, headache, nausea, vomiting, sweating, and increased  thirst. The reaction can last from 30 minutes to several hours. What side effects may I notice from receiving this medicine? Side effects that you should report to your doctor or health care professional as soon as possible: -allergic reactions like skin rash, itching or hives, swelling of the face, lips, or tongue -low blood counts - This drug may decrease the number of white blood cells, red blood cells and platelets. You may be at increased risk for infections and bleeding. -signs of infection - fever or chills, cough, sore throat, pain or difficulty passing urine -signs of decreased platelets or bleeding - bruising, pinpoint red spots on the skin, black, tarry stools, nosebleeds -signs of decreased red blood cells - unusually weak or tired, fainting spells, lightheadedness -breathing problems -chest pain -high or low blood pressure -mouth sores -nausea and vomiting -pain, swelling, redness or irritation at the injection site -pain, tingling, numbness in the hands or feet -slow or irregular heartbeat -swelling of the ankle, feet, hands Side effects that usually do not require medical attention (report to your doctor or health care professional if they continue or are bothersome): -bone pain -complete hair loss including hair on your head, underarms, pubic hair, eyebrows, and eyelashes -changes in the color of fingernails -diarrhea -loosening of the fingernails -loss of appetite -muscle or joint pain -red flush to skin -sweating This list may not describe all possible side effects. Call your doctor for medical advice about side effects. You may report side effects to FDA at 1-800-FDA-1088. Where should I keep my medicine? This drug is given in a hospital or clinic and will   stored at home. NOTE: This sheet is a summary. It may not cover all possible information. If you have questions about this medicine, talk to your doctor, pharmacist, or health care provider.    2016, [employer]. (2015-05-20 13:02:56)   Carboplatin injection What is this medicine? CARBOPLATIN (KAR boe pla tin) is a chemotherapy drug. It targets fast dividing cells, like cancer cells, and causes these cells to die. This medicine is used to treat ovarian cancer and many other cancers. This medicine may be used for other purposes; ask your health care provider or pharmacist if you have questions. What should I tell my health care provider before I take this medicine? They need to know if you have any of these conditions: -blood disorders -hearing problems -kidney disease -recent or ongoing radiation therapy -an unusual or allergic reaction to carboplatin, cisplatin, other chemotherapy, other medicines, foods, dyes, or preservatives -pregnant or trying to get pregnant -breast-feeding How should I use this medicine? This drug is usually given as an infusion into a vein. It is administered in a hospital or clinic by a specially trained health care professional. Talk to your pediatrician regarding the use of this medicine in children. Special care may be needed. Overdosage: If you think you have taken too much of this medicine contact a poison control [facility] or emergency room at once. NOTE: This medicine is only for you. Do not share this medicine with others. What if I miss a dose? It is important not to miss a dose. Call your doctor or health care professional if you are unable to keep an appointment. What may interact with this medicine? -medicines for seizures -medicines to increase blood counts like filgrastim, pegfilgrastim, sargramostim -some antibiotics like amikacin, gentamicin, neomycin, streptomycin, tobramycin -vaccines Talk to your doctor or health care professional before taking any of these medicines: -acetaminophen -aspirin -ibuprofen -ketoprofen -naproxen This list may not describe all possible interactions. Give your health care provider a list of all the medicines,  herbs, non-prescription drugs, or dietary supplements you use. Also tell them if you smoke, drink alcohol, or use illegal drugs. Some items may interact with your medicine. What should I watch for while using this medicine? Your condition will be monitored carefully while you are receiving this medicine. You will need important blood work done while you are taking this medicine. This drug may make you feel generally unwell. This is not uncommon, as chemotherapy can affect healthy cells as well as cancer cells. Report any side effects. Continue your course of treatment even though you feel ill unless your doctor tells you to stop. In some cases, you may be given additional medicines to help with side effects. Follow all directions for their use. Call your doctor or health care professional for advice if you get a fever, chills or sore throat, or other symptoms of a cold or flu. Do not treat yourself. This drug decreases your body's ability to fight infections. Try to avoid being around people who are sick. This medicine may increase your risk to bruise or bleed. Call your doctor or health care professional if you notice any unusual bleeding. Be careful brushing and flossing your teeth or using a toothpick because you may get an infection or bleed more easily. If you have any dental work done, tell your dentist you are receiving this medicine. Avoid taking products that contain aspirin, acetaminophen, ibuprofen, naproxen, or ketoprofen unless instructed by your doctor. These medicines may hide a fever. Do not become pregnant while  taking this medicine. Women should inform their doctor if they wish to become pregnant or think they might be pregnant. There is a potential for serious side effects to an unborn child. Talk to your health care professional or pharmacist for more information. Do not breast-feed an infant while taking this medicine. What side effects may I notice from receiving this medicine? Side  effects that you should report to your doctor or health care professional as soon as possible: -allergic reactions like skin rash, itching or hives, swelling of the face, lips, or tongue -signs of infection - fever or chills, cough, sore throat, pain or difficulty passing urine -signs of decreased platelets or bleeding - bruising, pinpoint red spots on the skin, black, tarry stools, nosebleeds -signs of decreased red blood cells - unusually weak or tired, fainting spells, lightheadedness -breathing problems -changes in hearing -changes in vision -chest pain -high blood pressure -low blood counts - This drug may decrease the number of white blood cells, red blood cells and platelets. You may be at increased risk for infections and bleeding. -nausea and vomiting -pain, swelling, redness or irritation at the injection site -pain, tingling, numbness in the hands or feet -problems with balance, talking, walking -trouble passing urine or change in the amount of urine Side effects that usually do not require medical attention (report to your doctor or health care professional if they continue or are bothersome): -hair loss -loss of appetite -metallic taste in the mouth or changes in taste This list may not describe all possible side effects. Call your doctor for medical advice about side effects. You may report side effects to FDA at 1-800-FDA-1088. Where should I keep my medicine? This drug is given in a hospital or clinic and will not be stored at home. NOTE: This sheet is a summary. It may not cover all possible information. If you have questions about this medicine, talk to your doctor, pharmacist, or health care provider.    2016, [employer]. (2008-01-07 14:38:05)   DO NOT TAKE LORAZEPAM AND CLORAZEPATE AT THE SAME TIME.

## 2015-09-21 NOTE — Progress Notes (Signed)
HR starting at 130 and fluctuating between 130 and 148, Sodium 132, pt stable and reports feeling nervous.  Dr. Marko Plume aware 250 cc of NS bolus and EKG ordered. Dr. Marko Plume aware of EKG results, okay to proceed with treatment, run KVO fluids at 171ml/hr per Dr. Marko Plume. Ativan IV 0.5 mg ordered and given. Pt in stable condition at all times   HR remaining in the 130s, Dr. Marko Plume aware, per Dr. Marko Plume it is okay to discharge pt home after Carboplatin complete. Pt in stable condition and no s/s of distress noted at this time.  HR 125 pt states she feels okay, no different than her normal, no s/s of distress. Pt educated to call clinic with any questions or concerns. Pt verbalizes understanding. Desk nurse aware and to call pt 09/22/15.

## 2015-09-22 ENCOUNTER — Telehealth: Payer: Self-pay

## 2015-09-22 ENCOUNTER — Ambulatory Visit (HOSPITAL_COMMUNITY): Payer: Commercial Managed Care - HMO

## 2015-09-22 NOTE — Telephone Encounter (Signed)
-----   Message from Egbert Garibaldi, RN sent at 09/21/2015  4:35 PM EST ----- Regarding: Dr. Marko Plume chemo f/u call  Pt of Dr. Marko Plume. First time 3 hour taxol and Carboplatin. Pt presented to infusion with HR 130s-140s Ativan IV and EKG ordered and carried out.pt tolerated treatment well.  Pt [id number] at discharge Dr. Marko Plume aware. Pt states she feels normal and no s/s of distress.

## 2015-09-22 NOTE — Telephone Encounter (Signed)
[facility] is doing well this morning.  No N/V.  She is eating well.  Will be drinking at least 4 16 oz bottles of water today.  Bowels  moved well this am.   She is not experiencing any palpitations as HR was high yesterday in infusion 142-125.  She knows to call [phone] if she has any questions or concerns.

## 2015-09-23 ENCOUNTER — Other Ambulatory Visit: Payer: Self-pay

## 2015-09-23 DIAGNOSIS — C541 Malignant neoplasm of endometrium: Secondary | ICD-10-CM

## 2015-09-26 ENCOUNTER — Other Ambulatory Visit: Payer: Self-pay | Admitting: Oncology

## 2015-09-26 DIAGNOSIS — C541 Malignant neoplasm of endometrium: Secondary | ICD-10-CM

## 2015-09-26 DIAGNOSIS — C786 Secondary malignant neoplasm of retroperitoneum and peritoneum: Secondary | ICD-10-CM

## 2015-09-26 DIAGNOSIS — C801 Malignant (primary) neoplasm, unspecified: Principal | ICD-10-CM

## 2015-09-27 ENCOUNTER — Ambulatory Visit (HOSPITAL_BASED_OUTPATIENT_CLINIC_OR_DEPARTMENT_OTHER): Payer: Commercial Managed Care - HMO | Admitting: Oncology

## 2015-09-27 ENCOUNTER — Other Ambulatory Visit: Payer: Self-pay

## 2015-09-27 ENCOUNTER — Emergency Department (HOSPITAL_COMMUNITY): Payer: Commercial Managed Care - HMO

## 2015-09-27 ENCOUNTER — Other Ambulatory Visit (HOSPITAL_BASED_OUTPATIENT_CLINIC_OR_DEPARTMENT_OTHER): Payer: Commercial Managed Care - HMO

## 2015-09-27 ENCOUNTER — Encounter (HOSPITAL_COMMUNITY): Payer: Self-pay | Admitting: Emergency Medicine

## 2015-09-27 ENCOUNTER — Encounter: Payer: Self-pay | Admitting: Oncology

## 2015-09-27 ENCOUNTER — Inpatient Hospital Stay (HOSPITAL_COMMUNITY)
Admission: EM | Admit: 2015-09-27 | Discharge: 2015-09-29 | DRG: 309 | Disposition: A | Discharge status: Home / Self Care | Payer: Commercial Managed Care - HMO | Attending: Internal Medicine | Admitting: Internal Medicine

## 2015-09-27 VITALS — BP 147/98 | HR 160 | Temp 98.6°F | Resp 18 | Ht 65.0 in | Wt 168.7 lb

## 2015-09-27 DIAGNOSIS — C786 Secondary malignant neoplasm of retroperitoneum and peritoneum: Secondary | ICD-10-CM | POA: Diagnosis present

## 2015-09-27 DIAGNOSIS — R000 Tachycardia, unspecified: Principal | ICD-10-CM | POA: Diagnosis present

## 2015-09-27 DIAGNOSIS — N183 Chronic kidney disease, stage 3 unspecified: Secondary | ICD-10-CM

## 2015-09-27 DIAGNOSIS — Z923 Personal history of irradiation: Secondary | ICD-10-CM | POA: Diagnosis not present

## 2015-09-27 DIAGNOSIS — C541 Malignant neoplasm of endometrium: Secondary | ICD-10-CM

## 2015-09-27 DIAGNOSIS — Z7952 Long term (current) use of systemic steroids: Secondary | ICD-10-CM

## 2015-09-27 DIAGNOSIS — Z823 Family history of stroke: Secondary | ICD-10-CM

## 2015-09-27 DIAGNOSIS — D701 Agranulocytosis secondary to cancer chemotherapy: Secondary | ICD-10-CM | POA: Diagnosis present

## 2015-09-27 DIAGNOSIS — E861 Hypovolemia: Secondary | ICD-10-CM | POA: Diagnosis present

## 2015-09-27 DIAGNOSIS — Z79899 Other long term (current) drug therapy: Secondary | ICD-10-CM

## 2015-09-27 DIAGNOSIS — Z6828 Body mass index (BMI) 28.0-28.9, adult: Secondary | ICD-10-CM | POA: Diagnosis not present

## 2015-09-27 DIAGNOSIS — Z803 Family history of malignant neoplasm of breast: Secondary | ICD-10-CM | POA: Diagnosis not present

## 2015-09-27 DIAGNOSIS — M810 Age-related osteoporosis without current pathological fracture: Secondary | ICD-10-CM | POA: Diagnosis present

## 2015-09-27 DIAGNOSIS — K5900 Constipation, unspecified: Secondary | ICD-10-CM | POA: Diagnosis present

## 2015-09-27 DIAGNOSIS — Z66 Do not resuscitate: Secondary | ICD-10-CM | POA: Diagnosis present

## 2015-09-27 DIAGNOSIS — Z9049 Acquired absence of other specified parts of digestive tract: Secondary | ICD-10-CM

## 2015-09-27 DIAGNOSIS — D72819 Decreased white blood cell count, unspecified: Secondary | ICD-10-CM

## 2015-09-27 DIAGNOSIS — T451X5A Adverse effect of antineoplastic and immunosuppressive drugs, initial encounter: Secondary | ICD-10-CM

## 2015-09-27 DIAGNOSIS — T447X6A Underdosing of beta-adrenoreceptor antagonists, initial encounter: Secondary | ICD-10-CM | POA: Diagnosis present

## 2015-09-27 DIAGNOSIS — Z806 Family history of leukemia: Secondary | ICD-10-CM | POA: Diagnosis not present

## 2015-09-27 DIAGNOSIS — D6481 Anemia due to antineoplastic chemotherapy: Secondary | ICD-10-CM | POA: Diagnosis present

## 2015-09-27 DIAGNOSIS — C801 Malignant (primary) neoplasm, unspecified: Secondary | ICD-10-CM | POA: Diagnosis not present

## 2015-09-27 DIAGNOSIS — E440 Moderate protein-calorie malnutrition: Secondary | ICD-10-CM | POA: Diagnosis present

## 2015-09-27 DIAGNOSIS — R180 Malignant ascites: Secondary | ICD-10-CM

## 2015-09-27 DIAGNOSIS — Z90710 Acquired absence of both cervix and uterus: Secondary | ICD-10-CM

## 2015-09-27 DIAGNOSIS — I5032 Chronic diastolic (congestive) heart failure: Secondary | ICD-10-CM | POA: Diagnosis present

## 2015-09-27 DIAGNOSIS — I130 Hypertensive heart and chronic kidney disease with heart failure and stage 1 through stage 4 chronic kidney disease, or unspecified chronic kidney disease: Secondary | ICD-10-CM | POA: Diagnosis present

## 2015-09-27 DIAGNOSIS — Z85048 Personal history of other malignant neoplasm of rectum, rectosigmoid junction, and anus: Secondary | ICD-10-CM | POA: Diagnosis not present

## 2015-09-27 DIAGNOSIS — E875 Hyperkalemia: Secondary | ICD-10-CM | POA: Diagnosis present

## 2015-09-27 DIAGNOSIS — E46 Unspecified protein-calorie malnutrition: Secondary | ICD-10-CM

## 2015-09-27 DIAGNOSIS — Z91138 Patient's unintentional underdosing of medication regimen for other reason: Secondary | ICD-10-CM

## 2015-09-27 DIAGNOSIS — R6881 Early satiety: Secondary | ICD-10-CM | POA: Diagnosis present

## 2015-09-27 DIAGNOSIS — C210 Malignant neoplasm of anus, unspecified: Secondary | ICD-10-CM | POA: Diagnosis not present

## 2015-09-27 DIAGNOSIS — E785 Hyperlipidemia, unspecified: Secondary | ICD-10-CM | POA: Diagnosis present

## 2015-09-27 DIAGNOSIS — D709 Neutropenia, unspecified: Secondary | ICD-10-CM | POA: Diagnosis not present

## 2015-09-27 DIAGNOSIS — K739 Chronic hepatitis, unspecified: Secondary | ICD-10-CM | POA: Diagnosis present

## 2015-09-27 DIAGNOSIS — F411 Generalized anxiety disorder: Secondary | ICD-10-CM | POA: Diagnosis present

## 2015-09-27 LAB — MAGNESIUM: MAGNESIUM: 1.7 mg/dL (ref 1.7–2.4)

## 2015-09-27 LAB — CBC WITH DIFFERENTIAL/PLATELET
BASO%: 0.4 % (ref 0.0–2.0)
Basophils Absolute: 0 10*3/uL (ref 0.0–0.1)
Basophils Absolute: 0 10*3/uL (ref 0.0–0.1)
Basophils Relative: 0 %
EOS ABS: 0 10*3/uL (ref 0.0–0.5)
EOS ABS: 0 10*3/uL (ref 0.0–0.7)
EOS PCT: 1 %
EOS%: 1.7 % (ref 0.0–7.0)
HCT: 34.1 % — ABNORMAL LOW (ref 34.8–46.6)
HCT: 30.5 % — ABNORMAL LOW (ref 36.0–46.0)
HGB: 11.2 g/dL — ABNORMAL LOW (ref 11.6–15.9)
Hemoglobin: 10.1 g/dL — ABNORMAL LOW (ref 12.0–15.0)
LYMPH%: 15.9 % (ref 14.0–49.7)
LYMPHS ABS: 0.4 10*3/uL — AB (ref 0.7–4.0)
Lymphocytes Relative: 23 %
MCH: 29.6 pg (ref 25.1–34.0)
MCH: 29.8 pg (ref 26.0–34.0)
MCHC: 32.8 g/dL (ref 31.5–36.0)
MCHC: 33.1 g/dL (ref 30.0–36.0)
MCV: 90.2 fL (ref 79.5–101.0)
MCV: 90 fL (ref 78.0–100.0)
MONO ABS: 0.1 10*3/uL (ref 0.1–1.0)
MONO#: 0.1 10*3/uL (ref 0.1–0.9)
MONO%: 3.3 % (ref 0.0–14.0)
MONOS PCT: 3 %
NEUT%: 78.7 % — AB (ref 38.4–76.8)
NEUTROS ABS: 1.9 10*3/uL (ref 1.5–6.5)
Neutro Abs: 1.3 10*3/uL — ABNORMAL LOW (ref 1.7–7.7)
Neutrophils Relative %: 73 %
PLATELETS: 260 10*3/uL (ref 145–400)
PLATELETS: 236 10*3/uL (ref 150–400)
RBC: 3.78 10*6/uL (ref 3.70–5.45)
RBC: 3.39 MIL/uL — AB (ref 3.87–5.11)
RDW: 12 % (ref 11.2–14.5)
RDW: 12.1 % (ref 11.5–15.5)
WBC: 2.4 10*3/uL — AB (ref 3.9–10.3)
WBC: 1.8 10*3/uL — AB (ref 4.0–10.5)
lymph#: 0.4 10*3/uL — ABNORMAL LOW (ref 0.9–3.3)

## 2015-09-27 LAB — BASIC METABOLIC PANEL
Anion gap: 7 (ref 5–15)
BUN: 16 mg/dL (ref 6–20)
CALCIUM: 8.7 mg/dL — AB (ref 8.9–10.3)
CO2: 26 mmol/L (ref 22–32)
CREATININE: 1.09 mg/dL — AB (ref 0.44–1.00)
Chloride: 99 mmol/L — ABNORMAL LOW (ref 101–111)
GFR calc Af Amer: 57 mL/min — ABNORMAL LOW (ref >60)
GFR, EST NON AFRICAN AMERICAN: 49 mL/min — AB (ref >60)
Glucose, Bld: 133 mg/dL — ABNORMAL HIGH (ref 65–99)
Potassium: 5.1 mmol/L (ref 3.5–5.1)
SODIUM: 132 mmol/L — AB (ref 135–145)

## 2015-09-27 LAB — COMPREHENSIVE METABOLIC PANEL
ALK PHOS: 64 U/L (ref 40–150)
ALT: 50 U/L (ref 0–55)
ANION GAP: 9 meq/L (ref 3–11)
AST: 66 U/L — ABNORMAL HIGH (ref 5–34)
Albumin: 2.7 g/dL — ABNORMAL LOW (ref 3.5–5.0)
BUN: 16.5 mg/dL (ref 7.0–26.0)
CHLORIDE: 96 meq/L — AB (ref 98–109)
CO2: 26 meq/L (ref 22–29)
Calcium: 9.5 mg/dL (ref 8.4–10.4)
Creatinine: 1.2 mg/dL — ABNORMAL HIGH (ref 0.6–1.1)
EGFR: 54 mL/min/{1.73_m2} — AB (ref >90)
Glucose: 145 mg/dl — ABNORMAL HIGH (ref 70–140)
POTASSIUM: 5.4 meq/L — AB (ref 3.5–5.1)
SODIUM: 132 meq/L — AB (ref 136–145)
TOTAL PROTEIN: 7.2 g/dL (ref 6.4–8.3)
Total Bilirubin: 0.44 mg/dL (ref 0.20–1.20)

## 2015-09-27 LAB — URINALYSIS, ROUTINE W REFLEX MICROSCOPIC
BILIRUBIN URINE: NEGATIVE
GLUCOSE, UA: NEGATIVE mg/dL
HGB URINE DIPSTICK: NEGATIVE
Ketones, ur: NEGATIVE mg/dL
Leukocytes, UA: NEGATIVE
Nitrite: NEGATIVE
PROTEIN: NEGATIVE mg/dL
Specific Gravity, Urine: 1.006 (ref 1.005–1.030)
pH: 7 (ref 5.0–8.0)

## 2015-09-27 LAB — TSH: TSH: 0.881 u[IU]/mL (ref 0.350–4.500)

## 2015-09-27 MED ORDER — SODIUM CHLORIDE 0.9 % IV BOLUS (SEPSIS)
1000.0000 mL | Freq: Once | INTRAVENOUS | Status: AC
  Administered 2015-09-27: 1000 mL via INTRAVENOUS

## 2015-09-27 MED ORDER — TBO-FILGRASTIM 300 MCG/0.5ML ~~LOC~~ SOSY
300.0000 ug | PREFILLED_SYRINGE | Freq: Once | SUBCUTANEOUS | Status: DC
  Filled 2015-09-27: qty 0.5

## 2015-09-27 MED ORDER — LORAZEPAM 2 MG/ML IJ SOLN
1.0000 mg | Freq: Once | INTRAMUSCULAR | Status: AC
  Administered 2015-09-27: 1 mg via INTRAVENOUS
  Filled 2015-09-27: qty 1

## 2015-09-27 MED ORDER — SODIUM CHLORIDE 0.9 % IV BOLUS (SEPSIS)
500.0000 mL | Freq: Once | INTRAVENOUS | Status: AC
  Administered 2015-09-27: 500 mL via INTRAVENOUS

## 2015-09-27 MED ORDER — IOHEXOL 350 MG/ML SOLN
100.0000 mL | Freq: Once | INTRAVENOUS | Status: AC | PRN
  Administered 2015-09-27: 100 mL via INTRAVENOUS

## 2015-09-27 NOTE — Progress Notes (Signed)
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 She had first chemotherapy with carboplatin and taxol on 09-21-15. She has not had gCSF as yet, tho likely needs to begin that in next few days as counts are dropping and not yet at nadir.  Concern now is tachycardia, with heart rate today 150-160. She denies chest pain or SOB. She has not been drinking fluids very well, and is clearly third spacing most of intake into the ascites. Last paracentesis was for 5.2 liters on 09-20-15.  She was tachycardic at 130-140 day of chemo, after premedication steroids and day after the 5+ liter paracentesis. She was given 250 bolus NS and additional IVF at treatment, with some minimal improvement in the tachycardia. EKG then had sinus tach and BP, respirations stable. In EMR she had EKG 04-2015 with HR 135.   Patient has felt tired but denies marked fatigue. She has had no nausea and did not have significant taxol aches.  She has had no fever. Abdominal distension is increased over what she had just after most recent paracentesis, but not yet tight or very uncomfortable. She has some low pelvic soreness, no clear dysuria and she is voiding some. She has no LE swelling or pain. Peripheral IV access was adequate for chemotherapy. She has had no bleeding.  Weight is down 15 lbs since 09-17-15, including 5+ liter paracentesis. She is drinking 1 Ensure daily with additional protein powder, not eating much otherwise. She has some early satiety, no vomiting. No peripheral neuropathy Remainder of 10 point Review of Systems unchanged/ negative.  Flu vaccine 08-09-15 No central line No genetics testing    ONCOLOGIC  HISTORY Patient has had regular medical care by PCP Dr Alain Marion and gyn Dr Garwin Brothers. Sometime after a fall in spring 2016, she noticed low pelvic discomfort and some intermittent vaginal spotting. She was seen for regular visit by Dr Garwin Brothers in May 2016, with PAP positive for AGUS and US showing endometrial stripe 8.59m. Endometrial biopsy 03-12-15 showed high grade adenocarcinoma with papillary features, however immunostains were not typical for serous carcinoma. She was seen by Dr RDenman Georgein June. CT CAP 04-14-15 showed no evidence of metastatic disease in chest, a 3.3 x 2.8 cm endometrial mas and 3.1 cm cystic lesion of right ovary. She had robotic total laparoscopic hysterectomy BSO with bilateral pelvic and [facility] Pathology ([id number] grade 2 endometrioid adenocarcinoma invading 0.35 cm where myometrium 1.1 cm, 26 nodes negative and no LVSI. She had intracavitary radiation due to risk for vaginal cuff recurrence from 06-01-15 thru 07-01-15, vaginal cuff 27.5 gray in 5 fractions. There were no obvious concerns when she was seen by Dr RDenman Georgein late July or Dr KSondra Comeand Dr PAlain Marionin October. She presented to ED on 09-06-15 with generalized abdominal pain, bloating and constipation x 1 month. CT AP 09-06-15 showed extensive nodularity and stranding thruout omentum and large volume ascites, with liver/spleen/pancreas/adrenals not remarkable, no bowel obstruction. She was admitted x 24 hours, with UKoreaparacentesi on 09-07-15 for 5 liters of serous fluid, cytology (([id number] adenocarcinoma. SHe was much more comfortable after paracentesis and cannot tell that the fluid has reaccumulated as yet. She had consultation with Dr RDenman George  on 09-13-15, recommendation for systemic chemotherapy. Review of cytology from ascites compared with surgical specimen and with the initial endometrial biopsy read at [facility] shows the same high grade features that were seen on  the initial endometrial biopsy (not seen on the full surgical path). She had repeat US paracentesis for 5.2 liters on 09-20-15. She had first carboplatin taxol on 09-21-15.    Objective:  Vital signs in last 24 hours:  BP 147/98 mmHg  Pulse 160  Temp(Src) 98.6 F (37 C) (Oral)  Resp 18  Ht 5' 5" (1.651 m)  Wt 168 lb 11.2 oz (76.522 kg)  BMI 28.07 kg/m2  SpO2 100% Weight down from 184 on 09-17-15  Respirations not labored RA.  Alert, oriented and appropriate, looks somewhat weak but NAD. Ambulatory without assistance, able to get on and off exam table with minimal assistance.  No alopecia  HEENT:PERRL, sclerae not icteric. Oral mucosa slightly moist without lesions, posterior pharynx clear.  Neck supple. No JVD.  Lymphatics:no cervical,supraclavicular or inguinal adenopathy Resp: clear to auscultation bilaterally and normal percussion bilaterally Cardio: tachy, regular rate and rhythm.  GI: soft, nontender including suprapubic,  Distended but not tight with clear fluid wave, cannot appreciate mass or organomegaly. A few bowel sounds. Surgical incision not remarkable. Musculoskeletal/ Extremities: without pitting edema, cords, tenderness Neuro: no peripheral neuropathy. Otherwise nonfocal  Psych appropriate mood and affect Skin  without rash, ecchymosis, petechiae   Lab Results:  Results for orders placed or performed in visit on 09/27/15  CBC with Differential  Result Value Ref Range   WBC 2.4 (L) 3.9 - 10.3 10e3/uL   NEUT# 1.9 1.5 - 6.5 10e3/uL   HGB 11.2 (L) 11.6 - 15.9 g/dL   HCT 34.1 (L) 34.8 - 46.6 %   Platelets 260 145 - 400 10e3/uL   MCV 90.2 79.5 - 101.0 fL   MCH 29.6 25.1 - 34.0 pg   MCHC 32.8 31.5 - 36.0 g/dL   RBC 3.78 3.70 - 5.45 10e6/uL   RDW 12.0 11.2 - 14.5 %   lymph# 0.4 (L) 0.9 - 3.3 10e3/uL   MONO# 0.1 0.1 - 0.9 10e3/uL   Eosinophils Absolute 0.0 0.0 - 0.5 10e3/uL   Basophils Absolute 0.0 0.0 - 0.1 10e3/uL   NEUT% 78.7 (H) 38.4 - 76.8 %   LYMPH% 15.9  14.0 - 49.7 %   MONO% 3.3 0.0 - 14.0 %   EOS% 1.7 0.0 - 7.0 %   BASO% 0.4 0.0 - 2.0 %  Comprehensive metabolic panel  Result Value Ref Range   Sodium 132 (L) 136 - 145 mEq/L   Potassium 5.4 (H) 3.5 - 5.1 mEq/L   Chloride 96 (L) 98 - 109 mEq/L   CO2 26 22 - 29 mEq/L   Glucose 145 (H) 70 - 140 mg/dl   BUN 16.5 7.0 - 26.0 mg/dL   Creatinine 1.2 (H) 0.6 - 1.1 mg/dL   Total Bilirubin 0.44 0.20 - 1.20 mg/dL   Alkaline Phosphatase 64 40 - 150 U/L   AST 66 (H) 5 - 34 U/L   ALT 50 0 - 55 U/L   Total Protein 7.2 6.4 - 8.3 g/dL   Albumin 2.7 (L) 3.5 - 5.0 g/dL   Calcium 9.5 8.4 - 10.4 mg/dL   Anion Gap 9 3 - 11 mEq/L   EGFR 54 (L) >90 ml/min/1.73 m2    Creatinine stable from prior to first chemo.  Studies/Results:  EKG repeated now and will be scanned into EMR: sinus tach at  152 , LAFB  and "possible anterior infarct age unknown"  Medications: I have reviewed the patient's current medications. She is on spironolactone begun early Dec, which will likely not improve the malignant ascites and can be stopped. No cardiac medications, NOT on decadron other than just prior to each chemo treatment.  DISCUSSION I have spoken with Dr Alain Marion by phone now regarding the tachycardia. She may be intravascularly fluid depleted due to third spacing with malignant ascites, but do not want to miss a primary cardiac etiology and she is also at least at risk for blood clots/ PE, tho nothing otherwise clinically suggests this. She does not appear septic/ infected otherwise, tho would be reasonable to look at urine with the low pelvic soreness. We are in agreement that evaluation this evening in ED is appropriate (nearly 5PM by time of visit). We have not given granix today, as I do not want to confuse the issue with possible sternal pain from gCSF; I do expect that she will need to start granix in next few days.   I reviewed with patient and family the path review information, which I had previously spoken with  her about by phone. We discussed again the extent of involvement thru omentum and peritoneal surfaces. I have told them that most likely this recurrent disease will not be cured, but that the gyn cancers often are very sensitive to treatment and hopefully will improve significantly just given longer on the chemotherapy We have discussed importance of good nutrition now.  Assessment/Plan: 1.Sinus tachycardia at 150-160: not markedly symptomatic but not clear if this is due to dehydration from malignant ascites vs cardiac, consider PE, consider infection. Patient taken from [place] to ED now. 2.Abdominal carcinomatosis: cytology same as high grade component found only on the original endometrial biopsy specimen (sent to [facility]), not in surgical path from 04-20-15. Massive ascites, with 5 liters removed 09-07-15 and 5.2 liters on 09-20-15. Chemotherapy begun with carbo taxol 09-21-15, which she actually has tolerated well thus far. Counts dropping, not yet at nadir, will need gCSF in next couple of days.  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 May be difficult to hydrate adequately even with IVF until malignant ascites responds to chemo. Now off HCTZ. Would stop spironolactone. No hydronephrosis by CT late Nov. Would be cautious about IV contrast if not necessary. 6.K 5.4: needs to hold oral potassium 7.History of "hepatitis" reportedly on steroids x 20 years which were being tapered when I met her initially, no longer showing on med list (decadron listed is premed 12 hrs prior to chemo only). I have spoken with ED RN in case this is relevant to  present problem  8.history of HTN and elevated lipids: now off HCTZ. I do not believe that she has cardiologist 9.post cholecystectomy and up to date colonoscopy and mammograms 10. Poor nutritional status: needs to increase supplements, discussed. Will need dietician to assist also.    All questions answered now, and patient / family in agreement with evaluation in ED. Taken to ED by wheelchair, report given by MD.  I will see her back at [facility] later this week if not admitted. Time spent 40 min including >  50% counseling and coordination of care.    LIVESAY,LENNIS P, MD   09/27/2015, 5:15 PM

## 2015-09-27 NOTE — ED Notes (Signed)
Patient transported to CT 

## 2015-09-27 NOTE — ED Notes (Signed)
Pt being sent by [facility] c/o tachycardia.  HR 150s.  Hx of GYN CA w/ large volume ascites.  Chemo x 6 days ago.

## 2015-09-27 NOTE — Patient Instructions (Signed)
Stop spironolactone (aldactone)  For bowels: Start colace (docusate sodium) 100 mg once or twice daily (stool softener) If this does not keep bowels moving well daily, add miralax (glycolax) one capful in a glass of any fluid once daily.  Drink at least one bottle of G2 gatorade daily

## 2015-09-27 NOTE — ED Notes (Signed)
Pt states that every time she goes to the Dr her heart rate and BP are high because she is so anxious to be there.

## 2015-09-27 NOTE — ED Notes (Signed)
Bed: [id number] Expected date:  Expected time:  Means of arrival:  Comments: Pt from [place]

## 2015-09-27 NOTE — ED Notes (Signed)
Pt was at [facility] center.  Hx of endometrial CA.  1/22 Paracentesis 5L and again 12/5 of 5L.  Carboplatin and texol chemo on 12/6 (her 1st Tx)

## 2015-09-27 NOTE — ED Provider Notes (Addendum)
CSN: [id number]     Arrival date & time 09/27/15  1722 History   First MD Initiated Contact with Patient 09/27/15 1809     Chief Complaint  Patient presents with  . Tachycardia     (Consider location/radiation/quality/duration/timing/severity/associated sxs/prior Treatment) HPI   Blood pressure 153/100, pulse 143, temperature 98.4 F (36.9 C), temperature source Oral, resp. rate 13, SpO2 100 %.  Sharon Holt is a 74 y.o. female sent from oncology office for evaluation of tachycardia. Patient denies chest pain, syncope, shortness of breath, cough, fever, chills. She has recurrent metastatic pelvic cancer, she restarted chemotherapy 2 weeks ago and has had no vomiting but nausea and decreased by mouth intake. She's been taking Zofran, Decadron and Ativan at home with little relief of the nausea. She also has anxiety which she takes Xanax. Patient states that she always has a fast heart rate at the doctor's office, states she is very nervous. Denies change in urination, calf pain, leg swelling, history of DVT or PE.  Past Medical History  Diagnosis Date  . Unspecified essential hypertension   . Mitral valve disorders   . Other and unspecified hyperlipidemia   . Other abnormal glucose   . Anxiety state, unspecified   . Anemia, unspecified   . 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 Transfusion history     "back in my 20's"  . Malignant neoplasm of anal canal ([place]) 15 yrs ago    treated with radiation   . Osteoporosis   . Endometrial cancer ([place]) 04/2015    serous adenocarcinoma  . History of brachytherapy 06/01/15, 06/10/15, 06/17/15, 06/24/15, 07/01/15    vaginal cuff 27.5 gray   Past Surgical History  Procedure Laterality Date  . Cholecystectomy    . Tonsillectomy    . Breast lumpectomy      right breast BENIGN  . Robotic assisted total hysterectomy with bilateral salpingo oopherectomy Bilateral 04/20/2015    Procedure: ROBOTIC ASSISTED TOTAL HYSTERECTOMY  WITH BILATERAL SALPINGO OOPHORECTOMY AND PELVIC AND PERIAORTIC LYMPHADECTOMY;  Surgeon: Janie Morning, MD;  Location: [facility];  Service: Gynecology;  Laterality: Bilateral;   Family History  Problem Relation Age of Onset  . Prostate cancer Brother     dx. 99s  . Stroke Father   . Breast cancer Sister     dx. 64-65  . Dementia Brother   . Breast cancer Sister 21    mastectomy  . Leukemia Sister     dx. 82s-70s   Social History  Substance Use Topics  . Smoking status: Never Smoker   . Smokeless tobacco: Never Used  . Alcohol Use: No   OB History    No data available     Review of Systems   10 systems reviewed and found to be negative, except as noted in the HPI.  Allergies  Review of patient's allergies indicates no known allergies.  Home Medications   Prior to Admission medications   Medication Sig Start Date End Date Taking? Authorizing Provider  Cholecalciferol (VITAMIN D3) 2000 UNITS capsule Take 2,000 Units by mouth daily.     Historical Provider, MD  clorazepate (TRANXENE) 7.5 MG tablet Take 1 tablet (7.5 mg total) by mouth 2 (two) times daily as needed for anxiety. 08/09/15   Aleksei Plotnikov V, MD  dexamethasone (DECADRON) 4 MG tablet Take 5 tablets by mouth with food 12 hours and 6 hours prior to taxol chemotherapy. 09/15/15   Lennis Marion Downer, MD  LORazepam (ATIVAN) 0.5 MG tablet Take 1 tablet by mouth every 6 (six) hours as needed. 09/15/15   Historical Provider, MD  Multiple Vitamins-Minerals (CENTRUM SILVER PO) Take 1 capsule by mouth daily.     Historical Provider, MD  ondansetron (ZOFRAN) 8 MG tablet Take 0.5-1 tablets (4-8 mg total) by mouth every 8 (eight) hours as needed for nausea, vomiting or refractory nausea / vomiting. Will NOT make drowsy. 09/17/15   Aleksei Plotnikov V, MD  potassium chloride SA (K-DUR,KLOR-CON) 20 MEQ tablet Take 1 tablet (20 mEq total) by mouth daily. 04/02/15   Aleksei Plotnikov V, MD  spironolactone (ALDACTONE) 50 MG tablet Take 1  tablet (50 mg total) by mouth daily. 09/17/15   Aleksei Plotnikov V, MD   BP 153/100 mmHg  Pulse 143  Temp(Src) 98.4 F (36.9 C) (Oral)  Resp 13  SpO2 100% Physical Exam  Constitutional: She is oriented to person, place, and time. She appears well-developed and well-nourished. No distress.  HENT:  Head: Normocephalic and atraumatic.  Mouth/Throat: Oropharynx is clear and moist.  Eyes: Conjunctivae and EOM are normal. Pupils are equal, round, and reactive to light.  Neck: Normal range of motion.  Cardiovascular: Regular rhythm and intact distal pulses.   Tachycardia  Pulmonary/Chest: Effort normal and breath sounds normal.  Abdominal: Soft. There is no tenderness.  Musculoskeletal: Normal range of motion.  Neurological: She is alert and oriented to person, place, and time.  Skin: She is not diaphoretic.  Psychiatric: She has a normal mood and affect.  Nursing note and vitals reviewed.   ED Course  Procedures (including critical care time) Labs Review Labs Reviewed  CBC WITH DIFFERENTIAL/PLATELET  BASIC METABOLIC PANEL  URINALYSIS, ROUTINE W REFLEX MICROSCOPIC (NOT AT [facility])    Imaging Review No results found. I have personally reviewed and evaluated these images and lab results as part of my medical decision-making.   EKG Interpretation None      MDM   Final diagnoses:  Sinus tachycardia ([place])    Filed Vitals:   09/27/15 1802 09/27/15 2018 09/27/15 2030 09/27/15 2123  BP: 153/100 168/97 157/100 153/100  Pulse: 143 147 139 139  Temp: 98.4 F (36.9 C)     TempSrc: Oral     Resp: 13 14 16 14   SpO2: 100% 100% 98% 100%    Medications  sodium chloride 0.9 % bolus 500 mL (0 mLs Intravenous Stopped 09/27/15 1937)  LORazepam (ATIVAN) injection 1 mg (1 mg Intravenous Given 09/27/15 1851)    Sharon Holt is 74 y.o. female presenting with tachycardia in the 140s 150s. Chart review shows that this patient has this issue approximately from last month. She  recently had metastatic rectal cancer diagnosis, she started chemotherapy a few weeks ago. She hasn't been eating and drinking normally but does not appear to be grossly dehydrated. EKG shows a sinus tachycardia. TSH is normal, CT without PE.  There is some question as to whether this patient is abruptly withdrawing from steroids per oncology. Patient states that she cuts a pill and a quarter and takes a quarter of a prednisone pill every other day, states she's been doing this for years, states that she has not changes recently. States that she's doing this for some.   Patient with a mild pancytopenia. Remains persistently tachycardic without fluids, will admit.       Monico Blitz, PA-C 10/01/15 [id number]  Forde Dandy, MD 10/04/15 [place], PA-C 10/07/15 Porter Liu, MD 10/07/15  2139 

## 2015-09-27 NOTE — ED Notes (Signed)
CT notifeid Pt ready for transport

## 2015-09-28 ENCOUNTER — Inpatient Hospital Stay (HOSPITAL_COMMUNITY): Payer: Commercial Managed Care - HMO

## 2015-09-28 ENCOUNTER — Other Ambulatory Visit: Payer: Self-pay | Admitting: Oncology

## 2015-09-28 ENCOUNTER — Telehealth: Payer: Self-pay | Admitting: Oncology

## 2015-09-28 DIAGNOSIS — I10 Essential (primary) hypertension: Secondary | ICD-10-CM

## 2015-09-28 DIAGNOSIS — C210 Malignant neoplasm of anus, unspecified: Secondary | ICD-10-CM

## 2015-09-28 DIAGNOSIS — B1920 Unspecified viral hepatitis C without hepatic coma: Secondary | ICD-10-CM

## 2015-09-28 DIAGNOSIS — R000 Tachycardia, unspecified: Secondary | ICD-10-CM

## 2015-09-28 DIAGNOSIS — C786 Secondary malignant neoplasm of retroperitoneum and peritoneum: Secondary | ICD-10-CM

## 2015-09-28 DIAGNOSIS — E875 Hyperkalemia: Secondary | ICD-10-CM | POA: Diagnosis present

## 2015-09-28 DIAGNOSIS — N183 Chronic kidney disease, stage 3 (moderate): Secondary | ICD-10-CM

## 2015-09-28 LAB — BASIC METABOLIC PANEL
Anion gap: 4 — ABNORMAL LOW (ref 5–15)
BUN: 12 mg/dL (ref 6–20)
CALCIUM: 7.9 mg/dL — AB (ref 8.9–10.3)
CHLORIDE: 101 mmol/L (ref 101–111)
CO2: 25 mmol/L (ref 22–32)
CREATININE: 1.01 mg/dL — AB (ref 0.44–1.00)
GFR calc Af Amer: >60 mL/min (ref >60)
GFR calc non Af Amer: 54 mL/min — ABNORMAL LOW (ref >60)
Glucose, Bld: 127 mg/dL — ABNORMAL HIGH (ref 65–99)
Potassium: 4.4 mmol/L (ref 3.5–5.1)
SODIUM: 130 mmol/L — AB (ref 135–145)

## 2015-09-28 LAB — DIFFERENTIAL
BASOS ABS: 0 10*3/uL (ref 0.0–0.1)
BASOS PCT: 1 %
EOS ABS: 0 10*3/uL (ref 0.0–0.7)
Eosinophils Relative: 2 %
LYMPHS PCT: 38 %
Lymphs Abs: 0.6 10*3/uL — ABNORMAL LOW (ref 0.7–4.0)
Monocytes Absolute: 0.1 10*3/uL (ref 0.1–1.0)
Monocytes Relative: 8 %
Neutro Abs: 1 10*3/uL — ABNORMAL LOW (ref 1.7–7.7)
Neutrophils Relative %: 51 %

## 2015-09-28 LAB — CBC
HCT: 27.9 % — ABNORMAL LOW (ref 36.0–46.0)
HEMOGLOBIN: 9.3 g/dL — AB (ref 12.0–15.0)
MCH: 29.7 pg (ref 26.0–34.0)
MCHC: 33.3 g/dL (ref 30.0–36.0)
MCV: 89.1 fL (ref 78.0–100.0)
Platelets: 221 10*3/uL (ref 150–400)
RBC: 3.13 MIL/uL — ABNORMAL LOW (ref 3.87–5.11)
RDW: 12.1 % (ref 11.5–15.5)
WBC: 1.7 10*3/uL — ABNORMAL LOW (ref 4.0–10.5)

## 2015-09-28 MED ORDER — SODIUM CHLORIDE 0.9 % IJ SOLN: 3.0000 mL | Freq: Two times a day (BID) | INTRAMUSCULAR | Status: DC

## 2015-09-28 MED ORDER — SODIUM CHLORIDE 0.9 % IV SOLN
INTRAVENOUS | Status: DC
  Administered 2015-09-28 – 2015-09-29 (×5): via INTRAVENOUS

## 2015-09-28 MED ORDER — PREDNISONE 2.5 MG PO TABS
2.5000 mg | ORAL_TABLET | ORAL | Status: DC
  Administered 2015-09-28: 2.5 mg via ORAL
  Filled 2015-09-28: qty 1

## 2015-09-28 MED ORDER — PRO-STAT SUGAR FREE PO LIQD
30.0000 mL | Freq: Two times a day (BID) | ORAL | Status: DC
  Administered 2015-09-29: 30 mL via ORAL
  Filled 2015-09-28 (×3): qty 30

## 2015-09-28 MED ORDER — TBO-FILGRASTIM 480 MCG/0.8ML ~~LOC~~ SOSY
480.0000 ug | PREFILLED_SYRINGE | Freq: Every day | SUBCUTANEOUS | Status: DC
  Administered 2015-09-28 – 2015-09-29 (×2): 480 ug via SUBCUTANEOUS
  Filled 2015-09-28 (×4): qty 0.8

## 2015-09-28 MED ORDER — CLORAZEPATE DIPOTASSIUM 7.5 MG PO TABS
7.5000 mg | ORAL_TABLET | Freq: Two times a day (BID) | ORAL | Status: DC | PRN
  Administered 2015-09-28: 7.5 mg via ORAL
  Filled 2015-09-28: qty 1

## 2015-09-28 MED ORDER — ENSURE ENLIVE PO LIQD
237.0000 mL | Freq: Two times a day (BID) | ORAL | Status: DC
  Administered 2015-09-28 – 2015-09-29 (×3): 237 mL via ORAL

## 2015-09-28 MED ORDER — HEPARIN SODIUM (PORCINE) 5000 UNIT/ML IJ SOLN
5000.0000 [IU] | Freq: Three times a day (TID) | INTRAMUSCULAR | Status: DC
  Administered 2015-09-28 – 2015-09-29 (×5): 5000 [IU] via SUBCUTANEOUS
  Filled 2015-09-28 (×7): qty 1

## 2015-09-28 MED ORDER — ONDANSETRON HCL 4 MG PO TABS
4.0000 mg | ORAL_TABLET | Freq: Three times a day (TID) | ORAL | Status: DC | PRN
Start: 2015-09-28 — End: 2015-09-29

## 2015-09-28 MED ORDER — ATENOLOL 50 MG PO TABS
50.0000 mg | ORAL_TABLET | Freq: Every day | ORAL | Status: DC
  Administered 2015-09-28 (×2): 50 mg via ORAL
  Filled 2015-09-28 (×3): qty 1

## 2015-09-28 MED ORDER — LORAZEPAM 0.5 MG PO TABS: 0.5000 mg | ORAL_TABLET | Freq: Four times a day (QID) | ORAL | Status: DC | PRN

## 2015-09-28 NOTE — Progress Notes (Signed)
Initial Nutrition Assessment  DOCUMENTATION CODES:   Not applicable  INTERVENTION:  - Continue Ensure Enlive po BID, each supplement provides 350 kcal and 20 grams of protein - Will order 30 mL Prostat BID, each supplement provides 100 kcal and 15 grams of protein  NUTRITION DIAGNOSIS:   Increased nutrient needs related to catabolic illness, cancer and cancer related treatments as evidenced by estimated needs.  GOAL:   Patient will meet greater than or equal to 90% of their needs  MONITOR:   PO intake, Supplement acceptance, Weight trends, Labs, Skin, I & O's  REASON FOR ASSESSMENT:   Malnutrition Screening Tool, Consult Poor PO (Advanced gyn cancer with ascites, early satiety. Needs small meals/ snacks frequently, supplements at least tid or more if you suggest, increased protein due to losses with ascites. Please see today if possible as may be DC soon.)  ASSESSMENT:   74 y.o. female with h/o endometrial carcinoma, with peritoneal carcinomatosis that was diagnosed at the end of last month. Has since undergone 2 paricentesis with massive volume ascites. 5L removed on 11/22 and 5.2 removed on 12/5. Patient was started on spironolactone.  Pt seen for MST and consult as indicated in parentheses above. No intakes documented. Pt reports she ate part of lunch which consisted of fish, mashed potatoes, and green beans. She states she drank an Ensure Enlive shortly before the meal and that this caused her not be eat as much of the meal. Pt reports feeling full very quickly and that she often feels that if she takes one more bite "it would come back up because I am so full." She was drinking Ensure PTA and mixing additional protein powder into it to increase protein consumption. Pt typically drank this for breakfast and may eat a small snack, such as a cheese stick, later in the day. Pt reports this was usually the extent of her intakes for the day. She states that poor appetite has been  ongoing for several months.   She denies any taste alterations while on chemo. Her husband is at bedside and states he feels pt is experiencing taste alteration and this is why she is not eating. Pt states that this is not the case and she is not eating due to lack of appetite rather than taste alterations. She denies chewing or swallowing difficulties.   Pt states UBW prior to starting chemo was 180 lbs and that weight has been fluctuating with fluid/ascites. Per chart, pt's last paracentesis was 09/20/15 with 5.2 L removed at that time. Weight since 06/01/15 has fluctuated: 168-191 lbs. Will continue to monitor weight trends with subsequent paracentesis which is possible during this admission based on MD notes. No muscle or fat wasting noted at this time. Unable to state malnutrition based on nutrition-related parameters although it may be present.  Talked with pt about small meals/snacks throughout the day, high calorie-high protein foods, and low fiber foods which may result in increased gastric emptying and transit time. Provided handouts to pt based on these topics from the [employer] and went over each handout with her and her husband. Also encouraged pt to add items such as sauces, gravies, and butter to foods to increase kcal in each bite.   Pt was not drinking smoothies or milkshakes PTA. Talked with her about incorporating these into her diet as she seems to do well with the Ensure mixed with protein powder and this way she could increase her intakes of other items that she  chose to mix into smoothies/milkshakes. Pt does not feel as full as quickly with liquids and so this may be a beneficial option for her.  Not meeting needs. Ensure Jeanne Ivan has already been ordered BID. Will also order Prostat BID to increase protein available to pt; will monitor for acceptance and tolerance of this supplement. Medications reviewed. Labs reviewed; Na: 130 mmol/L, creatinine elevated, Ca:  7.9 mg/dL.   Diet Order:  Diet regular Room service appropriate?: Yes; Fluid consistency:: Thin  Skin:  Reviewed, no issues  Last BM:  12/13  Height:   Ht Readings from Last 1 Encounters:  09/28/15 5\' 6"  (1.676 m)    Weight:   Wt Readings from Last 1 Encounters:  09/28/15 177 lb 8 oz (80.513 kg)    Ideal Body Weight:  59.09 kg (kg)  BMI:  Body mass index is 28.66 kg/(m^2).  Estimated Nutritional Needs:   Kcal:  2250-2400  Protein:  80-90 grams  Fluid:  1.6-1.9 L/day  EDUCATION NEEDS:   No education needs identified at this time     Jarome Matin, RD, LDN Inpatient Clinical Dietitian Pager # [phone] After hours/weekend pager # [phone]

## 2015-09-28 NOTE — Progress Notes (Signed)
[facility] PROGRESS NOTE   Sharon Holt [id number] DOB: August 29, 1941 DOA: 09/27/2015 PCP: Walker Kehr, MD  HPI/Subjective: Patient seen with husband at bedside, denies any complaints this morning. Telemetry reviewed, heart rate 80-100.  Assessment/Plan: Principal Problem:   Sinus tachycardia (HCC) Active Problems:   Peritoneal carcinomatosis (HCC)   Tachycardia   Hyperkalemia   This is a no charge note, patient admitted earlier today by my colleague Dr. Alcario Drought. Patient seen and examined, data base reviewed. Admitted to the hospital with persistent inappropriate sinus tachycardia, heart rate is 140-150. This was likely secondary to withdrawal from beta blockers, atenolol restarted yesterday heart rate is between 80 and 100. Seen by Dr. Marko Plume, another paracentesis ordered for today. Can probably discharge in the morning if she is okay, follow 2-D echo and can adjust beta blockers.  Code Status: DNR Family Communication: Plan discussed with the patient. Disposition Plan: Can probably discharge in the morning Diet: Diet regular Room service appropriate?: Yes; Fluid consistency:: Thin  Consultants:  Oncology  Procedures:  Paracentesis scheduled  Antibiotics:  None   Objective: Filed Vitals:   09/28/15 0100 09/28/15 0530  BP: 146/83 132/73  Pulse: 151 101  Temp: 99.1 F (37.3 C) 99.3 F (37.4 C)  Resp: 19 18    Intake/Output Summary (Last 24 hours) at 09/28/15 1447 Last data filed at 09/28/15 [id number]  Gross per 24 hour  Intake 714.58 ml  Output      0 ml  Net 714.58 ml   Filed Weights   09/28/15 0554  Weight: 80.513 kg (177 lb 8 oz)    Exam: General: Alert and awake, oriented x3, not in any acute distress. HEENT: anicteric sclera, pupils reactive to light and accommodation, EOMI CVS: S1-S2 clear, no murmur rubs or gallops Chest: clear to auscultation bilaterally, no wheezing, rales or rhonchi Abdomen: soft nontender,  nondistended, normal bowel sounds, no organomegaly Extremities: no cyanosis, clubbing or edema noted bilaterally Neuro: Cranial nerves II-XII intact, no focal neurological deficits  Data Reviewed: Basic Metabolic Panel:  Recent Labs Lab 09/27/15 1525 09/27/15 1924 09/28/15 0527  NA 132* 132* 130*  K 5.4* 5.1 4.4  CL  --  99* 101  CO2 26 26 25   GLUCOSE 145* 133* 127*  BUN 16.5 16 12   CREATININE 1.2* 1.09* 1.01*  CALCIUM 9.5 8.7* 7.9*  MG  --  1.7  --    Liver Function Tests:  Recent Labs Lab 09/27/15 1525  AST 66*  ALT 50  ALKPHOS 64  BILITOT 0.44  PROT 7.2  ALBUMIN 2.7*   No results for input(s): LIPASE, AMYLASE in the last 168 hours. No results for input(s): AMMONIA in the last 168 hours. CBC:  Recent Labs Lab 09/27/15 1525 09/27/15 1924 09/28/15 0527  WBC 2.4* 1.8* 1.7*  NEUTROABS 1.9 1.3* 1.0*  HGB 11.2* 10.1* 9.3*  HCT 34.1* 30.5* 27.9*  MCV 90.2 90.0 89.1  PLT 260 236 221   Cardiac Enzymes: No results for input(s): CKTOTAL, CKMB, CKMBINDEX, TROPONINI in the last 168 hours. BNP (last 3 results) No results for input(s): BNP in the last 8760 hours.  ProBNP (last 3 results) No results for input(s): PROBNP in the last 8760 hours.  CBG: No results for input(s): GLUCAP in the last 168 hours.  Micro No results found for this or any previous visit (from the past 240 hour(s)).   Studies: Dg Chest 2 View  09/27/2015  CLINICAL DATA:  74 year old female with history of rapid heart rate today. History of gynecologic  cancer with large volume of ascites. Chemotherapy. EXAM: CHEST  2 VIEW COMPARISON:  Chest x-ray 12/26/2010. FINDINGS: Lung volumes are low. No consolidative airspace disease. No pleural effusions. No pneumothorax. No pulmonary nodule or mass noted. Pulmonary vasculature and the cardiomediastinal silhouette are within normal limits. Surgical clips project over the right upper quadrant of the abdomen, compatible with prior cholecystectomy.  IMPRESSION: 1. Low lung volumes without radiographic evidence of acute cardiopulmonary disease. Electronically Signed   By: Vinnie Langton M.D.   On: 09/27/2015 19:27   Ct Angio Chest Pe W/cm &/or Wo Cm  09/27/2015  CLINICAL DATA:  Shortness of breath and tachycardia since tonight. History of anal cancer 15 years ago. Currently on chemotherapy and radiation therapy for endometrial cancer. Chronic hepatitis. Nonsmoker. EXAM: CT ANGIOGRAPHY CHEST WITH CONTRAST TECHNIQUE: Multidetector CT imaging of the chest was performed using the standard protocol during bolus administration of intravenous contrast. Multiplanar CT image reconstructions and MIPs were obtained to evaluate the vascular anatomy. CONTRAST:  158mL OMNIPAQUE IOHEXOL 350 MG/ML SOLN COMPARISON:  04/14/2015 FINDINGS: Technically adequate study with good opacification of the central and segmental pulmonary arteries. No focal filling defects demonstrated. No evidence of significant pulmonary embolus. Normal heart size. Normal caliber thoracic aorta. No aortic dissection. Great vessel origins are patent. Scattered calcification in the aorta. Small esophageal hiatal hernia. Esophagus is decompressed. No significant lymphadenopathy in the chest. Sub cm left thyroid gland nodule. No followup indicated due to small size appears Evaluation of the lungs is limited due to respiratory motion artifact. There is linear atelectasis in the lung bases. Thin-walled cyst in the left lingula. Dependent atelectasis in the lung bases. 6 mm nodule in the superior segment left lower lung was not present previously. Six-month follow-up study suggested. No focal consolidation. No pleural effusions. No pneumothorax. Included portions of the upper abdominal organs demonstrate moderate free fluid in the upper abdomen likely representing ascites. Abdominal fluid was not present on the previous study in this may represent reactive change related to radiation therapy. Other  etiologies are not excluded. Surgical absence of the gallbladder. Degenerative changes in the spine. No destructive bone lesions are appreciated. Review of the MIP images confirms the above findings. IMPRESSION: No evidence of significant pulmonary embolus. 6 mm nodule in the superior segment left lower lung. Suggest six-month follow-up. No focal consolidation in the lungs. Moderate free fluid in the upper abdomen may indicate ascites. Electronically Signed   By: Lucienne Capers M.D.   On: 09/27/2015 22:40    Scheduled Meds: . atenolol  50 mg Oral Daily  . feeding supplement (ENSURE ENLIVE)  237 mL Oral BID BM  . feeding supplement (PRO-STAT SUGAR FREE 64)  30 mL Oral BID  . heparin  5,000 Units Subcutaneous 3 times per day  . predniSONE  2.5 mg Oral QODAY  . sodium chloride  3 mL Intravenous Q12H  . Tbo-filgastrim (GRANIX) SQ  480 mcg Subcutaneous Daily   Continuous Infusions: . sodium chloride 125 mL/hr at 09/28/15 0730       Time spent: 35 minutes    [facility]  [facility] Pager [phone] If 7PM-7AM, please contact night-coverage at [email or web address], password [email or web address] 09/28/2015, 2:47 PM  LOS: 1 day

## 2015-09-28 NOTE — ED Provider Notes (Addendum)
Medical screening examination/treatment/procedure(s) were conducted as a shared visit with non-physician practitioner(s) and myself.  I personally evaluated the patient during the encounter.   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 Has been receiving therapeutic paracentesis in the setting of her large volume ascites.  Has sinus tachycardia with a heart rate of 140s, is normotensive and without respiratory distress. Is otherwise asymptomatic, denying any chest pain, difficulty breathing, lightheadedness, palpitations, syncope or near syncope, abdominal pain, nausea or vomiting, or diarrhea. No major metabolic or electrolyte derangements are noted. She is stable anemia. She received 1.5 L of IV fluids, without change to her tachycardia. He subsequently underwent CT PE that showed no evidence of acute PE or other acute intra-thoracic processes. She initially denied previously taking beta blocker, but then later discovered that she had discontinued her atenolol, which is likely playing role in tachycardia. Given persistent tachycardia of 140s, will admit to hospitalist service.  Forde Dandy, MD 09/28/15 [id number]  Forde Dandy, MD 09/28/15 [phone]

## 2015-09-28 NOTE — H&P (Signed)
[facility] History and Physical  Sharon Holt [id number] DOB: 13-Nov-1940 DOA: 09/27/2015  Referring physician: EDP PCP: Walker Kehr, MD   Chief Complaint: Tachycardia   HPI: Sharon Holt is a 74 y.o. female with h/o endometrial carcinoma, with peritoneal carcinomatosis that was diagnosed at the end of last month.  Has since undergone 2 paricentesis with massive volume ascites.  5L removed on 11/22 and 5.2 removed on 12/5.  Patient was started on spironolactone.  It seems that she missunderstood her PCP after the office visit on 12/2 where he states in his note to continue taking her atenolol for HTN.  She discontinued her atenolol at that time and has been off of it ever since.  Today in the oncologists office, her HR is noted to be in the 140s-150s.  She was sent to the ED.  In the ED she was given IVF without improvement in her HR.  Review of Systems: Systems reviewed.  As above, otherwise negative  Past Medical History  Diagnosis Date  . Unspecified essential hypertension   . Mitral valve disorders   . Other and unspecified hyperlipidemia   . Other abnormal glucose   . Anxiety state, unspecified   . Anemia, unspecified   . 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 Transfusion history     "back in my 20's"  . Malignant neoplasm of anal canal ([place]) 15 yrs ago    treated with radiation   . Osteoporosis   . 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 History of brachytherapy 06/01/15, 06/10/15, 06/17/15, 06/24/15, 07/01/15    vaginal cuff 27.5 gray   Past Surgical History  Procedure Laterality Date  . Cholecystectomy    . Tonsillectomy    . Breast lumpectomy      right breast BENIGN  . Robotic assisted total hysterectomy with bilateral salpingo oopherectomy Bilateral 04/20/2015    Procedure: ROBOTIC ASSISTED TOTAL HYSTERECTOMY WITH BILATERAL SALPINGO OOPHORECTOMY AND PELVIC AND PERIAORTIC LYMPHADECTOMY;  Surgeon:  Janie Morning, MD;  Location: [facility];  Service: Gynecology;  Laterality: Bilateral;   Social History:  reports that she has never smoked. She has never used smokeless tobacco. She reports that she does not drink alcohol or use illicit drugs.  No Known Allergies  Family History  Problem Relation Age of Onset  . Prostate cancer Brother     dx. 19s  . Stroke Father   . Breast cancer Sister     dx. 64-65  . Dementia Brother   . Breast cancer Sister 20    mastectomy  . Leukemia Sister     dx. 68s-70s     Prior to Admission medications   Medication Sig Start Date End Date Taking? Authorizing Provider  Cholecalciferol (VITAMIN D3) 2000 UNITS capsule Take 2,000 Units by mouth daily.    Yes Historical Provider, MD  clorazepate (TRANXENE) 7.5 MG tablet Take 1 tablet (7.5 mg total) by mouth 2 (two) times daily as needed for anxiety. 08/09/15  Yes Aleksei Plotnikov V, MD  dexamethasone (DECADRON) 4 MG tablet Take 5 tablets by mouth with food 12 hours and 6 hours prior to taxol chemotherapy. 09/15/15  Yes Lennis Marion Downer, MD  LORazepam (ATIVAN) 0.5 MG tablet Take 1 tablet by mouth every 6 (six) hours as needed (nausea.).  09/15/15  Yes Historical Provider, MD  Multiple Vitamins-Minerals (CENTRUM SILVER PO) Take 1 capsule by mouth daily.    Yes Historical Provider, MD  ondansetron (ZOFRAN) 8 MG tablet Take 0.5-1 tablets (4-8 mg total) by mouth every 8 (eight) hours as needed for nausea, vomiting or refractory nausea / vomiting. Will NOT make drowsy. 09/17/15  Yes Aleksei Plotnikov V, MD  potassium chloride SA (K-DUR,KLOR-CON) 20 MEQ tablet Take 1 tablet (20 mEq total) by mouth daily. 04/02/15  Yes Aleksei Plotnikov V, MD  spironolactone (ALDACTONE) 50 MG tablet Take 1 tablet (50 mg total) by mouth daily. 09/17/15  Yes Cassandria Anger, MD   Physical Exam: Filed Vitals:   09/27/15 2330 09/27/15 2331  BP: 140/85 140/85  Pulse: 142 143  Temp:    Resp: 14 18    BP 140/85 mmHg  Pulse 143   Temp(Src) 98.4 F (36.9 C) (Oral)  Resp 18  SpO2 98%  General Appearance:    Alert, oriented, no distress, appears stated age  Head:    Normocephalic, atraumatic  Eyes:    PERRL, EOMI, sclera non-icteric        Nose:   Nares without drainage or epistaxis. Mucosa, turbinates normal  Throat:   Moist mucous membranes. Oropharynx without erythema or exudate.  Neck:   Supple. No carotid bruits.  No thyromegaly.  No lymphadenopathy.   Back:     No CVA tenderness, no spinal tenderness  Lungs:     Clear to auscultation bilaterally, without wheezes, rhonchi or rales  Chest wall:    No tenderness to palpitation  Heart:    Regular rate and rhythm without murmurs, gallops, rubs  Abdomen:     Soft, non-tender, nondistended, normal bowel sounds, no organomegaly  Genitalia:    deferred  Rectal:    deferred  Extremities:   No clubbing, cyanosis or edema.  Pulses:   2+ and symmetric all extremities  Skin:   Skin color, texture, turgor normal, no rashes or lesions  Lymph nodes:   Cervical, supraclavicular, and axillary nodes normal  Neurologic:   CNII-XII intact. Normal strength, sensation and reflexes      throughout    Labs on Admission:  Basic Metabolic Panel:  Recent Labs Lab 09/21/15 0934 09/27/15 1525 09/27/15 1924  NA 132* 132* 132*  K 5.0 5.4* 5.1  CL  --   --  99*  CO2 21* 26 26  GLUCOSE 180* 145* 133*  BUN 18.0 16.5 16  CREATININE 1.2* 1.2* 1.09*  CALCIUM 9.3 9.5 8.7*  MG  --   --  1.7   Liver Function Tests:  Recent Labs Lab 09/21/15 0934 09/27/15 1525  AST 48* 66*  ALT 25 50  ALKPHOS 75 64  BILITOT 0.31 0.44  PROT 7.4 7.2  ALBUMIN 2.5* 2.7*   No results for input(s): LIPASE, AMYLASE in the last 168 hours. No results for input(s): AMMONIA in the last 168 hours. CBC:  Recent Labs Lab 09/21/15 0934 09/27/15 1525 09/27/15 1924  WBC 10.4* 2.4* 1.8*  NEUTROABS 10.0* 1.9 1.3*  HGB 12.7 11.2* 10.1*  HCT 38.4 34.1* 30.5*  MCV 91.9 90.2 90.0  PLT 452* 260 236    Cardiac Enzymes: No results for input(s): CKTOTAL, CKMB, CKMBINDEX, TROPONINI in the last 168 hours.  BNP (last 3 results) No results for input(s): PROBNP in the last 8760 hours. CBG: No results for input(s): GLUCAP in the last 168 hours.  Radiological Exams on Admission: Dg Chest 2 View  09/27/2015  CLINICAL DATA:  74 year old female with history of rapid heart rate today. History of gynecologic cancer with large volume of ascites. Chemotherapy. EXAM: CHEST  2  VIEW COMPARISON:  Chest x-ray 12/26/2010. FINDINGS: Lung volumes are low. No consolidative airspace disease. No pleural effusions. No pneumothorax. No pulmonary nodule or mass noted. Pulmonary vasculature and the cardiomediastinal silhouette are within normal limits. Surgical clips project over the right upper quadrant of the abdomen, compatible with prior cholecystectomy. IMPRESSION: 1. Low lung volumes without radiographic evidence of acute cardiopulmonary disease. Electronically Signed   By: Vinnie Langton M.D.   On: 09/27/2015 19:27   Ct Angio Chest Pe W/cm &/or Wo Cm  09/27/2015  CLINICAL DATA:  Shortness of breath and tachycardia since tonight. History of anal cancer 15 years ago. Currently on chemotherapy and radiation therapy for endometrial cancer. Chronic hepatitis. Nonsmoker. EXAM: CT ANGIOGRAPHY CHEST WITH CONTRAST TECHNIQUE: Multidetector CT imaging of the chest was performed using the standard protocol during bolus administration of intravenous contrast. Multiplanar CT image reconstructions and MIPs were obtained to evaluate the vascular anatomy. CONTRAST:  146mL OMNIPAQUE IOHEXOL 350 MG/ML SOLN COMPARISON:  04/14/2015 FINDINGS: Technically adequate study with good opacification of the central and segmental pulmonary arteries. No focal filling defects demonstrated. No evidence of significant pulmonary embolus. Normal heart size. Normal caliber thoracic aorta. No aortic dissection. Great vessel origins are patent. Scattered  calcification in the aorta. Small esophageal hiatal hernia. Esophagus is decompressed. No significant lymphadenopathy in the chest. Sub cm left thyroid gland nodule. No followup indicated due to small size appears Evaluation of the lungs is limited due to respiratory motion artifact. There is linear atelectasis in the lung bases. Thin-walled cyst in the left lingula. Dependent atelectasis in the lung bases. 6 mm nodule in the superior segment left lower lung was not present previously. Six-month follow-up study suggested. No focal consolidation. No pleural effusions. No pneumothorax. Included portions of the upper abdominal organs demonstrate moderate free fluid in the upper abdomen likely representing ascites. Abdominal fluid was not present on the previous study in this may represent reactive change related to radiation therapy. Other etiologies are not excluded. Surgical absence of the gallbladder. Degenerative changes in the spine. No destructive bone lesions are appreciated. Review of the MIP images confirms the above findings. IMPRESSION: No evidence of significant pulmonary embolus. 6 mm nodule in the superior segment left lower lung. Suggest six-month follow-up. No focal consolidation in the lungs. Moderate free fluid in the upper abdomen may indicate ascites. Electronically Signed   By: Lucienne Capers M.D.   On: 09/27/2015 22:40    EKG: Independently reviewed.  Assessment/Plan Principal Problem:   Sinus tachycardia (HCC) Active Problems:   Peritoneal carcinomatosis (HCC)   Tachycardia   Hyperkalemia   1. Tachycardia - suspect that beta-blocker withdrawal may be playing a role here as she recently (10 days ago), stopped her atenolol (had been taking 50mg  daily). 1. Resume atenolol 50mg  daily, first dose tonight in ED.  Patient noted to be mildly hypertensive with SBPs in the 150s at this time. 2. Stopping the spironolactone as recommended by Dr. Marko Plume in his note today in office 3. IVF  at 125 cc/hr 4. Tele monitor 2. Chemo patient with peritoneal carcinomatosis - Have put consult into epic to notify Dr. Marko Plume of admission 3. Mild hyperkalemia - holding PO potassium and spironolactone    Code Status: Full  Family Communication: Family at bedside Disposition Plan: Admit to inpatient   Time spent: 15 min  Lash Matulich M. [facility] Pager [phone]  If 7AM-7PM, please contact the day team taking care of the patient [email or web address] Password [email or web address] 09/28/2015, 12:03 AM

## 2015-09-28 NOTE — Telephone Encounter (Signed)
Appointments moved per pof and patient will get in hospital    anne

## 2015-09-28 NOTE — Progress Notes (Signed)
Echocardiogram 2D Echocardiogram has been performed.  Sharon Holt 09/28/2015, 3:18 PM

## 2015-09-28 NOTE — Progress Notes (Signed)
MEDICAL ONCOLOGY September 28, 2015, 9:28 AM  Hospital day 2 Antibiotics: none Chemotherapy: day 8 cycle 1 carboplatin taxol (given 09-21-15)  Appreciate excellent care by ED and hospitalist service. EMR reviewed, patient seen with husband at bedside.  Tachycardia improved compared with 150-160 at [facility] on 09-27-15. No PE, no acute cardiac events, inadvertent DC of long term atenolol likely major contributor.  Blood counts are lower from recent first chemo and with hydration, neutropenic today, not febrile. Hemoglobin down to 9.3 with chemo and hydration, platelets not yet at nadir 221k today. Will begin gCSF now. Note she is also on prednisone 2.5 mg qod on gradual steroid taper, as she has used steroids for >20 years continuously for "hepatitis". Patient reports that she has continued the prednisone as above, tho this did not show on [facility] or admission meds.   Subjective: Feeling ok now, did not sleep much. Has been up to BR without difficulty, with assistance. Breathing comfortable supine on RA. 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 No pain lower abdomen/ suprapubic now with UA clear in ED. Patient can tell ascites increasing since last paracentesis 5.2 liters on 09-20-15, not as uncomfortable yet as prior to that procedure but would be glad for paracentesis prior to DC. No pain now. No bleeding. No LE swelling. No nausea  Flu vaccine 08-09-15 No central line No genetics testing    ONCOLOGIC HISTORY Patient has had regular medical care by PCP Dr Alain Marion and gyn Dr Garwin Brothers. Sometime after a fall in spring 2016, she noticed low pelvic discomfort and some intermittent vaginal spotting. She was seen for regular visit by Dr Garwin Brothers in May 2016, with PAP positive for AGUS and US showing  endometrial stripe 8.66m. Endometrial biopsy 03-12-15 showed high grade adenocarcinoma with papillary features, however immunostains were not typical for serous carcinoma. She was seen by Dr RDenman Georgein June. CT CAP 04-14-15 showed no evidence of metastatic disease in chest, a 3.3 x 2.8 cm endometrial mas and 3.1 cm cystic lesion of right ovary. She had robotic total laparoscopic hysterectomy BSO with bilateral pelvic and [facility] Pathology ([phone] grade 2 endometrioid adenocarcinoma invading 0.35 cm where myometrium 1.1 cm, 26 nodes negative and no LVSI. She had intracavitary radiation due to risk for vaginal cuff recurrence from 06-01-15 thru 07-01-15, vaginal cuff 27.5 gray in 5 fractions. There were no obvious concerns when she was seen by Dr RDenman Georgein late July or Dr KSondra Comeand Dr PAlain Marionin October. She presented to ED on 09-06-15 with generalized abdominal pain, bloating and constipation x 1 month. CT AP 09-06-15 showed extensive nodularity and stranding thruout omentum and large volume ascites, with liver/spleen/pancreas/adrenals not remarkable, no bowel obstruction. She was admitted x 24 hours, with UKoreaparacentesi on 09-07-15 for 5 liters of serous fluid, cytology (([id number] adenocarcinoma. SHe was much more comfortable after paracentesis and cannot tell that the fluid has reaccumulated as yet. She had consultation with Dr RDenman Georgeon 09-13-15, recommendation for systemic chemotherapy. Review of cytology from ascites compared with surgical specimen and [facility] the same high grade features that were seen on the initial endometrial biopsy (not seen on the full surgical path). She had repeat UKoreaparacentesis for 5.2 liters  on 09-20-15. She had first carboplatin taxol on 09-21-15 and was neutropenic with ANC 1.0 on day 8 cycle 1    Objective: Vital signs in last 24 hours: Blood pressure 132/73,  pulse 101, temperature 99.3 F (37.4 C), temperature source Oral, resp. rate 18, height _0  (1.676 m), weight 177 lb 8 oz (80.513 kg), SpO2 100 %.   Intake/Output from previous day: 12/12 0701 - 12/13 0700 In: 714.6 [I.V.:714.6] Out: -  Intake/Output this shift:    Physical exam: awake, alert, fully oriented and appropriate, looks more comfortable supine in bed on RA. Peripheral IV LUE NS at 125. No alopecia. Mucous membranes and nailbeds pale. Oral mucosa moist without lesions. PERRL, not icteric. No JVD supine. Lungs without wheezes or rales. Heart RRR no gallop. Abdomen with obvious fluid wave, soft, not tightly distended but increasing. Not tender epigastrium. Cannot appreciate mass or organomegaly. Not tender suprapubic. Few BS, normal. LE no edema, cords, tenderness. Moves all extremities easily. Psych appropriate mood and affect, brighter than in office yesterday. Skin without rash, petechiae, ecchymoses.  Lab Results:  Recent Labs  09/27/15 1924 09/28/15 0527  WBC 1.8* 1.7*  HGB 10.1* 9.3*  HCT 30.5* 27.9*  PLT 236 221  ANC 1.0   BMET  Recent Labs  09/27/15 1924 09/28/15 0527  NA 132* 130*  K 5.1 4.4  CL 99* 101  CO2 26 25  GLUCOSE 133* 127*  BUN 16 12  CREATININE 1.09* 1.01*  CALCIUM 8.7* 7.9*   Creatinine stable post IV contrast for CTA chest on admission  UA from admission sp gr 1.006, no protein or leukocytes  TSH pending  Studies/Results: Dg Chest 2 View  09/27/2015  CLINICAL DATA:  74 year old female with history of rapid heart rate today. History of gynecologic cancer with large volume of ascites. Chemotherapy. EXAM: CHEST  2 VIEW COMPARISON:  Chest x-ray 12/26/2010. FINDINGS: Lung volumes are low. No consolidative airspace disease. No pleural effusions. No pneumothorax. No pulmonary nodule or mass noted. Pulmonary vasculature and the cardiomediastinal silhouette are within normal limits. Surgical clips project over the right upper quadrant of the  abdomen, compatible with prior cholecystectomy. IMPRESSION: 1. Low lung volumes without radiographic evidence of acute cardiopulmonary disease. Electronically Signed   By: Vinnie Langton M.D.   On: 09/27/2015 19:27   Ct Angio Chest Pe W/cm &/or Wo Cm  09/27/2015  CLINICAL DATA:  Shortness of breath and tachycardia since tonight. History of anal cancer 15 years ago. Currently on chemotherapy and radiation therapy for endometrial cancer. Chronic hepatitis. Nonsmoker. EXAM: CT ANGIOGRAPHY CHEST WITH CONTRAST TECHNIQUE: Multidetector CT imaging of the chest was performed using the standard protocol during bolus administration of intravenous contrast. Multiplanar CT image reconstructions and MIPs were obtained to evaluate the vascular anatomy. CONTRAST:  1102m OMNIPAQUE IOHEXOL 350 MG/ML SOLN COMPARISON:  04/14/2015 FINDINGS: Technically adequate study with good opacification of the central and segmental pulmonary arteries. No focal filling defects demonstrated. No evidence of significant pulmonary embolus. Normal heart size. Normal caliber thoracic aorta. No aortic dissection. Great vessel origins are patent. Scattered calcification in the aorta. Small esophageal hiatal hernia. Esophagus is decompressed. No significant lymphadenopathy in the chest. Sub cm left thyroid gland nodule. No followup indicated due to small size appears Evaluation of the lungs is limited due to respiratory motion artifact. There is linear atelectasis in the lung bases. Thin-walled cyst in the left lingula. Dependent atelectasis in the lung bases. 6 mm nodule in the superior segment left lower lung was  not present previously. Six-month follow-up study suggested. No focal consolidation. No pleural effusions. No pneumothorax. Included portions of the upper abdominal organs demonstrate moderate free fluid in the upper abdomen likely representing ascites. Abdominal fluid was not present on the previous study in this may represent reactive  change related to radiation therapy. Other etiologies are not excluded. Surgical absence of the gallbladder. Degenerative changes in the spine. No destructive bone lesions are appreciated. Review of the MIP images confirms the above findings. IMPRESSION: No evidence of significant pulmonary embolus. 6 mm nodule in the superior segment left lower lung. Suggest six-month follow-up. No focal consolidation in the lungs. Moderate free fluid in the upper abdomen may indicate ascites. Electronically Signed   By: Lucienne Capers M.D.   On: 09/27/2015 22:40     Assessment/Plan: 1.Sinus tachycardia: likely related to inadvertent DC of beta blocker as well as intravascular fluid depletion, no PE by CTA chest. . Back on atenolol, looks better hydrated, tho expect hydration will continue to be problematic until the malignant ascites improves with chemo recently begun. TSH and echocardiogram pending. 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 Massive ascites, with 5 liters removed 09-07-15 and 5.2 liters on 09-20-15. If possible to have US paracentesis today, that would be helpful as otherwise will likely need outpatient later this week.  Chemotherapy begun with carbo taxol 09-21-15.  Counts dropping and neutropenic today tho not yet at nadir, begin daily granix now which will be continued at [facility] this week after DC. Neutropenic precautions. Daily CBC diff while in hospital. gCSF and neutropenic precautions discussed. 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  May be difficult to hydrate adequately even with IVF until malignant ascites responds to chemo. Now off HCTZ and now off spironolactone. No hydronephrosis by CT late Nov. Would be cautious about IV contrast if not necessary, tho tolerated IV contrast with CTA chest on admission. 6.Hyperkalemia mild on admission, from K supplement and spironolactone, both DCd on admission and K in normal range now 7.History of "hepatitis" reportedly on steroids x 20 years which were being tapered when I met her initially, present dose 2.5 mg QOD which she had been taking (tho not showing on med list in EMR yesterday) 8.history of HTN and elevated lipids: now off HCTZ. I do not believe that she has cardiologist 9.post cholecystectomy and up to date colonoscopy and mammograms 10. Poor nutritional status: needs to increase supplements, discussed. Have asked dietician to assist also.   Please page me [phone] or cell [phone] if I can help between my rounds, or med onc on call if needed. She will need granix at [facility] on 12-14 if DC today, and I will see her on 12-15 at office with lab if DC by then.  Namari Breton P  MD

## 2015-09-28 NOTE — Procedures (Signed)
Ultrasound-guided therapeutic paracentesis performed yielding 1.6 L blood-tinged fluid. No immediate complications.

## 2015-09-29 ENCOUNTER — Other Ambulatory Visit: Payer: Self-pay | Admitting: Oncology

## 2015-09-29 DIAGNOSIS — R180 Malignant ascites: Secondary | ICD-10-CM

## 2015-09-29 DIAGNOSIS — C801 Malignant (primary) neoplasm, unspecified: Secondary | ICD-10-CM

## 2015-09-29 DIAGNOSIS — E875 Hyperkalemia: Secondary | ICD-10-CM

## 2015-09-29 DIAGNOSIS — C786 Secondary malignant neoplasm of retroperitoneum and peritoneum: Secondary | ICD-10-CM

## 2015-09-29 DIAGNOSIS — T451X5A Adverse effect of antineoplastic and immunosuppressive drugs, initial encounter: Secondary | ICD-10-CM

## 2015-09-29 DIAGNOSIS — Z7952 Long term (current) use of systemic steroids: Secondary | ICD-10-CM

## 2015-09-29 DIAGNOSIS — D709 Neutropenia, unspecified: Secondary | ICD-10-CM

## 2015-09-29 DIAGNOSIS — D701 Agranulocytosis secondary to cancer chemotherapy: Secondary | ICD-10-CM

## 2015-09-29 LAB — CBC WITH DIFFERENTIAL/PLATELET
Basophils Absolute: 0 10*3/uL (ref 0.0–0.1)
Basophils Relative: 1 %
EOS PCT: 2 %
Eosinophils Absolute: 0 10*3/uL (ref 0.0–0.7)
HEMATOCRIT: 25 % — AB (ref 36.0–46.0)
HEMOGLOBIN: 8.5 g/dL — AB (ref 12.0–15.0)
LYMPHS PCT: 38 %
Lymphs Abs: 0.6 10*3/uL — ABNORMAL LOW (ref 0.7–4.0)
MCH: 30.8 pg (ref 26.0–34.0)
MCHC: 34 g/dL (ref 30.0–36.0)
MCV: 90.6 fL (ref 78.0–100.0)
MONO ABS: 0.4 10*3/uL (ref 0.1–1.0)
MONOS PCT: 32 %
NEUTROS PCT: 27 %
Neutro Abs: 0.4 10*3/uL — ABNORMAL LOW (ref 1.7–7.7)
PLATELETS: 168 10*3/uL (ref 150–400)
RBC: 2.76 MIL/uL — AB (ref 3.87–5.11)
RDW: 12.2 % (ref 11.5–15.5)
WBC: 1.4 10*3/uL — AB (ref 4.0–10.5)

## 2015-09-29 MED ORDER — ATENOLOL 25 MG PO TABS: 75.0000 mg | ORAL_TABLET | Freq: Every day | ORAL | Status: DC

## 2015-09-29 MED ORDER — ENSURE ENLIVE PO LIQD: 237.0000 mL | Freq: Two times a day (BID) | ORAL | Status: DC

## 2015-09-29 MED ORDER — ATENOLOL 50 MG PO TABS
75.0000 mg | ORAL_TABLET | Freq: Every day | ORAL | Status: DC
  Administered 2015-09-29: 75 mg via ORAL
  Filled 2015-09-29: qty 1

## 2015-09-29 MED ORDER — CIPROFLOXACIN HCL 250 MG PO TABS
250.0000 mg | ORAL_TABLET | Freq: Two times a day (BID) | ORAL | Status: DC
  Filled 2015-09-29 (×2): qty 1

## 2015-09-29 MED ORDER — PRO-STAT SUGAR FREE PO LIQD: 30.0000 mL | Freq: Two times a day (BID) | ORAL | Status: DC

## 2015-09-29 NOTE — Progress Notes (Signed)
MEDICAL ONCOLOGY September 29, 2015, 9:14 AM  Hospital day 3 Antibiotics: day 1 po Cipro, prophylactic for ANC <0.5 Chemotherapy: day 9 cycle 1 carbo taxol (09-21-15) US paracentesis 1.6 liters 12-113-16  Patient seen with husband and another family member present; discussed also with unit RN  Neutropenic this AM with ANC 0.4. Tmax 99.8 at 0645.  Hemoglobin 8.5 and plt 168 today; these and ANC probably not yet at nadir from recent chemo.   Subjective: Feeling better this AM. Slept well, drank 1 Ensure last PM and will drink another this AM (did not want meal). No symptoms of infection, no bleeding, tolerating up to BR without SOB or excessive weakness. No chest pain or palpitations. Bowels have moved and voiding without problems. Low pelvic discomfort less.  Paracentesis for 1.6 liters on 12-13, tolerated well and more comfortable. Information from dietician yesterday helpful.   ONCOLOGIC HISTORY Patient has had regular medical care by PCP Dr Alain Marion and gyn Dr Garwin Brothers. Sometime after a fall in early 2016, she noticed low pelvic discomfort and some intermittent vaginal spotting. She was seen for regular visit by Dr Garwin Brothers in May 2016, with PAP positive for AGUS and US showing endometrial stripe 8.71m. Endometrial biopsy 03-12-15 showed high grade adenocarcinoma with papillary features, however immunostains were not typical for serous carcinoma. She was seen by Dr RDenman Georgein June. CT CAP 04-14-15 showed no evidence of metastatic disease in chest, a 3.3 x 2.8 cm endometrial mas and 3.1 cm cystic lesion of right ovary. She had robotic total laparoscopic hysterectomy BSO with bilateral pelvic and para aortic [facility] Pathology [phone] grade 2 endometrioid adenocarcinoma invading 0.35 cm where myometrium 1.1 cm, 26 nodes negative and no LVSI. She had intracavitary radiation due to risk for vaginal cuff recurrence from 06-01-15 thru 07-01-15, vaginal  cuff 27.5 gray in 5 fractions. There were no obvious concerns when she was seen by Dr RDenman Georgein late July or Dr KSondra Comeand Dr PAlain Marionin October. She presented to ED on 09-06-15 with generalized abdominal pain, bloating and constipation x 1 month. CT AP 09-06-15 showed extensive nodularity and stranding thruout omentum and large volume ascites, with liver/spleen/pancreas/adrenals not remarkable, no bowel obstruction. She was admitted x 24 hours, with UKoreaparacentesi on 09-07-15 for 5 liters of serous fluid, cytology (([id number] adenocarcinoma. SHe was much more comfortable after paracentesis and cannot tell that the fluid has reaccumulated as yet. She had consultation with Dr RDenman Georgeon 09-13-15, recommendation for systemic chemotherapy. Review of cytology from ascites compared with surgical specimen and [facility] same high grade features that were seen on the initial endometrial biopsy (not seen on the full surgical path). She had repeat UKoreaparacentesis for 5.2 liters on 09-20-15. She had first carboplatin taxol on 09-21-15 and was neutropenic with ANC 1.0 on day 8 cycle 1  Flu vaccine 08-09-15 No central line    Objective: Vital signs in last 24 hours: Blood pressure 130/69, pulse 113, temperature 99.8 F (37.7 C), temperature source Oral, resp. rate 20, height _0  (1.676 m), weight 177 lb 8 oz (80.513 kg), SpO2 100 %. IVF via left antecubital NS still at 125 cc/hr.  Intake/Output from previous day: 12/13 0701 - 12/14 0700 In: 3057 [P.O.:245; I.V.:2812] Out: -  Intake/Output this shift:    Physical exam: awake, alert, looks comfortable in bed on RA. Needs minimal assistance to sit up in bed. PERRL, not  icteric. Mucous membranes pale, nailbeds pale. Lungs clear anteriorly and posteriorly. Heart RRR, no gallop. Abdomen less distended, some bowel sounds, not tender, still fluid wave tho decreased. Peripheral IV left antecubital site ok. Feet  warm, no pedal edema. Moves all extremities easily. Neuro no focal deficits on bed exam. Psych appropriate mood and affect.   Lab Results:  Recent Labs  09/28/15 0527 09/29/15 0400  WBC 1.7* 1.4*  HGB 9.3* 8.5*  HCT 27.9* 25.0*  PLT 221 168  ANC today 0.4   BMET  Recent Labs  09/27/15 1924 09/28/15 0527  NA 132* 130*  K 5.1 4.4  CL 99* 101  CO2 26 25  GLUCOSE 133* 127*  BUN 16 12  CREATININE 1.09* 1.01*  CALCIUM 8.7* 7.9*    Studies/Results: Dg Chest 2 View  09/27/2015  CLINICAL DATA:  74 year old female with history of rapid heart rate today. History of gynecologic cancer with large volume of ascites. Chemotherapy. EXAM: CHEST  2 VIEW COMPARISON:  Chest x-ray 12/26/2010. FINDINGS: Lung volumes are low. No consolidative airspace disease. No pleural effusions. No pneumothorax. No pulmonary nodule or mass noted. Pulmonary vasculature and the cardiomediastinal silhouette are within normal limits. Surgical clips project over the right upper quadrant of the abdomen, compatible with prior cholecystectomy. IMPRESSION: 1. Low lung volumes without radiographic evidence of acute cardiopulmonary disease. Electronically Signed   By: Vinnie Langton M.D.   On: 09/27/2015 19:27   Ct Angio Chest Pe W/cm &/or Wo Cm  09/27/2015  CLINICAL DATA:  Shortness of breath and tachycardia since tonight. History of anal cancer 15 years ago. Currently on chemotherapy and radiation therapy for endometrial cancer. Chronic hepatitis. Nonsmoker. EXAM: CT ANGIOGRAPHY CHEST WITH CONTRAST TECHNIQUE: Multidetector CT imaging of the chest was performed using the standard protocol during bolus administration of intravenous contrast. Multiplanar CT image reconstructions and MIPs were obtained to evaluate the vascular anatomy. CONTRAST:  159m OMNIPAQUE IOHEXOL 350 MG/ML SOLN COMPARISON:  04/14/2015 FINDINGS: Technically adequate study with good opacification of the central and segmental pulmonary arteries. No focal  filling defects demonstrated. No evidence of significant pulmonary embolus. Normal heart size. Normal caliber thoracic aorta. No aortic dissection. Great vessel origins are patent. Scattered calcification in the aorta. Small esophageal hiatal hernia. Esophagus is decompressed. No significant lymphadenopathy in the chest. Sub cm left thyroid gland nodule. No followup indicated due to small size appears Evaluation of the lungs is limited due to respiratory motion artifact. There is linear atelectasis in the lung bases. Thin-walled cyst in the left lingula. Dependent atelectasis in the lung bases. 6 mm nodule in the superior segment left lower lung was not present previously. Six-month follow-up study suggested. No focal consolidation. No pleural effusions. No pneumothorax. Included portions of the upper abdominal organs demonstrate moderate free fluid in the upper abdomen likely representing ascites. Abdominal fluid was not present on the previous study in this may represent reactive change related to radiation therapy. Other etiologies are not excluded. Surgical absence of the gallbladder. Degenerative changes in the spine. No destructive bone lesions are appreciated. Review of the MIP images confirms the above findings. IMPRESSION: No evidence of significant pulmonary embolus. 6 mm nodule in the superior segment left lower lung. Suggest six-month follow-up. No focal consolidation in the lungs. Moderate free fluid in the upper abdomen may indicate ascites. Electronically Signed   By: WLucienne CapersM.D.   On: 09/27/2015 22:40   UKoreaParacentesis  09/28/2015  INDICATION: Endometrial cancer, recurrent ascites. Request is made for therapeutic  paracentesis. EXAM: ULTRASOUND-GUIDED THERAPEUTIC PARACENTESIS COMPARISON:  Prior paracentesis on 09/20/2015 MEDICATIONS: None. COMPLICATIONS: None immediate TECHNIQUE: Informed written consent was obtained from the patient after a discussion of the risks, benefits and  alternatives to treatment. A timeout was performed prior to the initiation of the procedure. Initial ultrasound scanning demonstrates a amount of ascites within the right lower abdominal quadrant. The right lower abdomen was prepped and draped in the usual sterile fashion. 1% lidocaine was used for local anesthesia. Under direct ultrasound guidance, a 19 gauge, 10-cm, Yueh catheter was introduced. An ultrasound image was saved for documentation purposed. The paracentesis was performed. The catheter was removed and a dressing was applied. The patient tolerated the procedure well without immediate post procedural complication. FINDINGS: A total of approximately 1.6 liters of blood-tinged fluid was removed. IMPRESSION: Successful ultrasound-guided therapeutic paracentesis yielding 1.6 liters of peritoneal fluid. Read by: Rowe Robert, PA-C Electronically Signed   By: Markus Daft M.D.   On: 09/28/2015 16:39     MEDICATIONS reviewed in EMR. Received granix 12-13 and RN aware that this should be given this AM and will be repeated at [facility] on 12-15 if DC today.  With [place] <0.4 will start po Cipro as prophylactic, which will need to continue at home until [facility] better.   Assessment/Plan:  1.Sinus tachycardia: related to inadvertent DC of beta blocker as well as intravascular fluid depletion, no PE by CTA chest. Back on atenolol,  better hydrated, tho expect hydration will continue to be problematic until the malignant ascites improves with chemo recently begun. TSH 0.88 and echocardiogram with good EF. 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 Massive ascites, with 5 liters removed 09-07-15 and 5.2 liters on 09-20-15. If possible to have US paracentesis today, that would be helpful as otherwise will likely need outpatient later this week. Chemotherapy begun with carbo taxol 09-21-15.  3.  Chemo neutropenia: Counts still dropping out from first chemo and not yet at nadir, started daily granix 09-28-15 which will be continued at [facility] this week after DC. Neutropenic precautions. Daily CBC diff while in hospital. gCSF and neutropenic precautions discussed. Will start prophylactic oral cipro as ANC <0.5 today, needs to continue this at home if DC today. She needs blood and urine cultures and to begin IV antibiotics if temp >=100.5. If DC today, they will need to check temp at home regularly and call / go to ED if temp >=100.5 or symptoms of infection. 4.chemo anemia: may not be at nadir, also IVF since admission (tho this also third spacing into ascites). Denies symptoms, tho may need PRBCs in next few days if continues to drop. 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 May be difficult to hydrate adequately even with IVF until malignant ascites responds to chemo. Now off HCTZ and now off spironolactone. No hydronephrosis by CT late Nov. Would be cautious about IV contrast if not necessary, tho tolerated IV contrast with CTA chest on admission. 7.Hyperkalemia mild on admission, from K supplement and spironolactone, both DCd on admission and K in normal range now 8.History of "hepatitis" reportedly on steroids x 20  years which were being tapered when I met her initially, present dose 2.5 mg QOD which she had been taking (tho not showing on med list in EMR yesterday) 9.history of HTN and elevated lipids: now off HCTZ. I do not believe that she has cardiologist 10.post cholecystectomy and up to date colonoscopy and mammograms 11. Poor nutritional status: needs to  increase supplements, discussed. Appreciate assistance from hospital nutritionist on 09-28-15  If DC, she is to see me with lab at [facility] on 09-30-15, appointments in EMR If not DC, I will see her in hospital tomorrow. Please page [phone] or call [phone] if I can be of help later today, or med onc on call  Keerat Denicola P MD

## 2015-09-29 NOTE — Progress Notes (Signed)
CRITICAL VALUE ALERT  Critical value received:   WBC 1.4  Date of notification:  09/29/2015  Time of notification:  0430  Critical value read back:Yes.    Nurse who received alert:  Felicity Coyer  MD notified (1st page):  K.Schorr  Time of first page:  [phone]  MD notified (2nd page):  Time of second page:  Responding MD:    Time MD responded:

## 2015-09-29 NOTE — Discharge Summary (Signed)
Physician Discharge Summary  Sharon Holt [id number] DOB: 15-Jul-1941 DOA: 09/27/2015  PCP: Walker Kehr, MD  Admit date: 09/27/2015 Discharge date: 09/29/2015  Time spent: 35 minutes  Recommendations for Outpatient Follow-up:  1. Repeat BMET to follow electrolytes and renal function  2. Reassess BP and adjust antihypertensive regimen 3. Please follow volume closely and initiate appropriate diuretic regimen if needed    Discharge Diagnoses:  Sinus tachycardia ([place]) Peritoneal carcinomatosis ([place]) Hyperkalemia CKD stage 3 Hyperkalemia HTN Moderate protein calorie malnutrition  Anemia/neutropenia: associated with chemotherapy    Discharge Condition: stable and improved. Discharge home with follow up with PCP in 1 week and outpatient follow up by oncology service   Diet recommendation: low sodium diet  Filed Weights   09/28/15 0554  Weight: 80.513 kg (177 lb 8 oz)    History of present illness:  74 y.o. female with h/o endometrial carcinoma, with peritoneal carcinomatosis that was diagnosed at the end of last month. Has since undergone 2 paricentesis with massive volume ascites. 5L removed on 11/22 and 5.2 removed on 12/5. Patient was started on spironolactone.  It seems that she missunderstood her PCP after the office visit on 12/2 where he states in his note to continue taking her atenolol for HTN. She discontinued her atenolol at that time and has been off of it ever since.  Hospital Course:  1-sinus tachycardia: due to B-blocker rebound and intravascular depletion -improved/resolved with IVF's and resumption of atenolol (75mg  daily) -patient feeling ok -will follow up with PCP in 1 week  2-Abdominal Carcinomatosis: with recurrent malignant ascites  -will follow up with oncology as an outpatient  -continue chemotherapy -PRN paracentesis  -might need resumption of lasix and spironolactone down the road to help with volume  control  3-anemia/neutropenia: due to chemotherapy -follow up with oncology service as an outpatient  4-CKD stage 3: stable and at baseline -advise to maintain good hydration -diuretics on hold at discharge -1.6 L removed during this admission from paracentesis   5-essential HTN: continue atenolol -advise to follow low sodium diet -reassessment of BP and adjustment to antihypertensive regimen to be done by PCP  6-moderate protein malnutrition: -will discharge on ensure ans Prostat  -appreciate nutritional service   7-hyperkalemia: due to use of spironolactone and potassium supplements  - resolved with IVF's and after stopping supplementation and spironolactone   8-chronic diastolic heart failure: stage 2; preserved EF -advise to follow low sodium diet -will need close follow up of volume status and initiation of lasix -continue B-blocker   Procedures:  Paracentesis (09/28/15; 1.6 L blood-tinged fluid removed)  Consultations:  IR  Oncology   Discharge Exam: Filed Vitals:   09/29/15 0644 09/29/15 1254  BP: 130/69 152/68  Pulse: 113 94  Temp: 99.8 F (37.7 C) 99.4 F (37.4 C)  Resp: 20 18    General: afebrile, no CP, no SOB and asking to be discharge Cardiovascular: S1 and S2, no rubs or gallops Respiratory: CTA bilaterally Abd: non tender; positive BS Extremities: no cyanosis and no clubbing   Discharge Instructions   Discharge Instructions    Diet - low sodium heart healthy    Complete by:  As directed      Discharge instructions    Complete by:  As directed   Maintain adequate hydration Minimize sodium intake Take medications as prescribed Follow up with PCP in 1 week          Current Discharge Medication List    START taking these medications   Details  Amino Acids-Protein Hydrolys (FEEDING SUPPLEMENT, PRO-STAT SUGAR FREE 64,) LIQD Take 30 mLs by mouth 2 (two) times daily. Qty: 900 mL, Refills: 0    atenolol (TENORMIN) 25 MG tablet Take 3  tablets (75 mg total) by mouth daily. Qty: 45 tablet, Refills: 1    feeding supplement, ENSURE ENLIVE, (ENSURE ENLIVE) LIQD Take 237 mLs by mouth 2 (two) times daily between meals. Qty: 237 mL, Refills: 12      CONTINUE these medications which have NOT CHANGED   Details  Cholecalciferol (VITAMIN D3) 2000 UNITS capsule Take 2,000 Units by mouth daily.     clorazepate (TRANXENE) 7.5 MG tablet Take 1 tablet (7.5 mg total) by mouth 2 (two) times daily as needed for anxiety. Qty: 180 tablet, Refills: 1    dexamethasone (DECADRON) 4 MG tablet Take 5 tablets by mouth with food 12 hours and 6 hours prior to taxol chemotherapy. Qty: 10 tablet, Refills: 0   Associated Diagnoses: Malignant neoplasm of endometrium (HCC)    LORazepam (ATIVAN) 0.5 MG tablet Take 1 tablet by mouth every 6 (six) hours as needed (nausea.).  Refills: 0    Multiple Vitamins-Minerals (CENTRUM SILVER PO) Take 1 capsule by mouth daily.     ondansetron (ZOFRAN) 8 MG tablet Take 0.5-1 tablets (4-8 mg total) by mouth every 8 (eight) hours as needed for nausea, vomiting or refractory nausea / vomiting. Will NOT make drowsy. Qty: 30 tablet, Refills: 2   Associated Diagnoses: Malignant neoplasm of endometrium (HCC)    predniSONE (DELTASONE) 5 MG tablet Take 2.5 mg by mouth every other day.      STOP taking these medications     potassium chloride SA (K-DUR,KLOR-CON) 20 MEQ tablet      spironolactone (ALDACTONE) 50 MG tablet        No Known Allergies Follow-up Information    Follow up with Walker Kehr, MD. Schedule an appointment as soon as possible for a visit in 1 week.   Specialty:  Internal Medicine   Contact information:   [place] [address] [phone]       The results of significant diagnostics from this hospitalization (including imaging, microbiology, ancillary and laboratory) are listed below for reference.    Significant Diagnostic Studies: Dg Chest 2 View  09/27/2015   CLINICAL DATA:  74 year old female with history of rapid heart rate today. History of gynecologic cancer with large volume of ascites. Chemotherapy. EXAM: CHEST  2 VIEW COMPARISON:  Chest x-ray 12/26/2010. FINDINGS: Lung volumes are low. No consolidative airspace disease. No pleural effusions. No pneumothorax. No pulmonary nodule or mass noted. Pulmonary vasculature and the cardiomediastinal silhouette are within normal limits. Surgical clips project over the right upper quadrant of the abdomen, compatible with prior cholecystectomy. IMPRESSION: 1. Low lung volumes without radiographic evidence of acute cardiopulmonary disease. Electronically Signed   By: Vinnie Langton M.D.   On: 09/27/2015 19:27   Ct Angio Chest Pe W/cm &/or Wo Cm  09/27/2015  CLINICAL DATA:  Shortness of breath and tachycardia since tonight. History of anal cancer 15 years ago. Currently on chemotherapy and radiation therapy for endometrial cancer. Chronic hepatitis. Nonsmoker. EXAM: CT ANGIOGRAPHY CHEST WITH CONTRAST TECHNIQUE: Multidetector CT imaging of the chest was performed using the standard protocol during bolus administration of intravenous contrast. Multiplanar CT image reconstructions and MIPs were obtained to evaluate the vascular anatomy. CONTRAST:  137mL OMNIPAQUE IOHEXOL 350 MG/ML SOLN COMPARISON:  04/14/2015 FINDINGS: Technically adequate study with good opacification of the central and segmental  pulmonary arteries. No focal filling defects demonstrated. No evidence of significant pulmonary embolus. Normal heart size. Normal caliber thoracic aorta. No aortic dissection. Great vessel origins are patent. Scattered calcification in the aorta. Small esophageal hiatal hernia. Esophagus is decompressed. No significant lymphadenopathy in the chest. Sub cm left thyroid gland nodule. No followup indicated due to small size appears Evaluation of the lungs is limited due to respiratory motion artifact. There is linear atelectasis in  the lung bases. Thin-walled cyst in the left lingula. Dependent atelectasis in the lung bases. 6 mm nodule in the superior segment left lower lung was not present previously. Six-month follow-up study suggested. No focal consolidation. No pleural effusions. No pneumothorax. Included portions of the upper abdominal organs demonstrate moderate free fluid in the upper abdomen likely representing ascites. Abdominal fluid was not present on the previous study in this may represent reactive change related to radiation therapy. Other etiologies are not excluded. Surgical absence of the gallbladder. Degenerative changes in the spine. No destructive bone lesions are appreciated. Review of the MIP images confirms the above findings. IMPRESSION: No evidence of significant pulmonary embolus. 6 mm nodule in the superior segment left lower lung. Suggest six-month follow-up. No focal consolidation in the lungs. Moderate free fluid in the upper abdomen may indicate ascites. Electronically Signed   By: Lucienne Capers M.D.   On: 09/27/2015 22:40   Ct Abdomen Pelvis W Contrast  09/06/2015  CLINICAL DATA:  Upper abdominal pain, distention. History of endometrial cancer and prior surgery and radiation. EXAM: CT ABDOMEN AND PELVIS WITH CONTRAST TECHNIQUE: Multidetector CT imaging of the abdomen and pelvis was performed using the standard protocol following bolus administration of intravenous contrast. CONTRAST:  3mL OMNIPAQUE IOHEXOL 300 MG/ML  SOLN COMPARISON:  06/14/2015 FINDINGS: Linear areas of atelectasis in the lung bases bilaterally. Heart is normal size. No effusions. Prior cholecystectomy. Large volume ascites in the abdomen and pelvis. No focal lesion within the liver, spleen, pancreas, adrenals and kidneys. No hydronephrosis. Abnormal soft tissue noted within the omentum anteriorly throughout the abdomen and upper pelvis concerning for omental metastatic disease. No evidence of bowel obstruction. Small bilateral  inguinal hernias containing fat. Urinary bladder is unremarkable. Prior hysterectomy and presumed oophorectomy. Aorta is normal caliber. No acute bony abnormality or focal bone lesion. IMPRESSION: Extensive nodularity and stranding throughout the omentum anteriorly concerning for metastatic disease. Large volume ascites in the abdomen and pelvis. Electronically Signed   By: Rolm Baptise M.D.   On: 09/06/2015 15:53   US Paracentesis  09/28/2015  INDICATION: Endometrial cancer, recurrent ascites. Request is made for therapeutic paracentesis. EXAM: ULTRASOUND-GUIDED THERAPEUTIC PARACENTESIS COMPARISON:  Prior paracentesis on 09/20/2015 MEDICATIONS: None. COMPLICATIONS: None immediate TECHNIQUE: Informed written consent was obtained from the patient after a discussion of the risks, benefits and alternatives to treatment. A timeout was performed prior to the initiation of the procedure. Initial ultrasound scanning demonstrates a amount of ascites within the right lower abdominal quadrant. The right lower abdomen was prepped and draped in the usual sterile fashion. 1% lidocaine was used for local anesthesia. Under direct ultrasound guidance, a 19 gauge, 10-cm, Yueh catheter was introduced. An ultrasound image was saved for documentation purposed. The paracentesis was performed. The catheter was removed and a dressing was applied. The patient tolerated the procedure well without immediate post procedural complication. FINDINGS: A total of approximately 1.6 liters of blood-tinged fluid was removed. IMPRESSION: Successful ultrasound-guided therapeutic paracentesis yielding 1.6 liters of peritoneal fluid. Read by: Rowe Robert, PA-C Electronically Signed  By: Markus Daft M.D.   On: 09/28/2015 16:39   US Paracentesis  09/20/2015  INDICATION: History of endometrial cancer, recurrent ascites and request for therapeutic paracentesis. EXAM: ULTRASOUND-GUIDED PARACENTESIS COMPARISON:  Paracentesis 09/07/15. MEDICATIONS: None.  COMPLICATIONS: None immediate TECHNIQUE: Informed written consent was obtained from the patient after a discussion of the risks, benefits and alternatives to treatment. A timeout was performed prior to the initiation of the procedure. Initial ultrasound scanning demonstrates a large amount of ascites within the right lower abdominal quadrant. The right lower abdomen was prepped and draped in the usual sterile fashion. 1% lidocaine was used for local anesthesia. Under direct ultrasound guidance, a 19 gauge, 10-cm, Yueh catheter was introduced. An ultrasound image was saved for documentation purposed. The paracentesis was performed. The catheter was removed and a dressing was applied. The patient tolerated the procedure well without immediate post procedural complication. FINDINGS: A total of approximately 5.2 liters of serous fluid was removed. IMPRESSION: Successful ultrasound-guided paracentesis yielding 5.2 liters of peritoneal fluid. Read By:  Tsosie Billing PA-C Electronically Signed   By: Marybelle Killings M.D.   On: 09/20/2015 14:29   US Paracentesis  09/07/2015  INDICATION: History of endometrial cancer, ascites and request for paracentesis. EXAM: ULTRASOUND-GUIDED PARACENTESIS COMPARISON:  CT Abdomen/Pelvis 09/06/2015. MEDICATIONS: None. COMPLICATIONS: None immediate TECHNIQUE: Informed written consent was obtained from the patient after a discussion of the risks, benefits and alternatives to treatment. A timeout was performed prior to the initiation of the procedure. Initial ultrasound scanning demonstrates a large amount of ascites within the right lower abdominal quadrant. The right lower abdomen was prepped and draped in the usual sterile fashion. 1% lidocaine was used for local anesthesia. Under direct ultrasound guidance, a 19 gauge, 10-cm, Yueh catheter was introduced. An ultrasound image was saved for documentation purposed. The paracentesis was performed. The catheter was removed and a dressing was  applied. The patient tolerated the procedure well without immediate post procedural complication. FINDINGS: A total of approximately 5 liters of serous fluid was removed. Samples were sent to the laboratory as requested by the clinical team. IMPRESSION: Successful ultrasound-guided paracentesis yielding 5 liters of peritoneal fluid. Read By:  Tsosie Billing PA-C Electronically Signed   By: Jacqulynn Cadet M.D.   On: 09/07/2015 12:31   Dg Abd 2 Views  09/02/2015  CLINICAL DATA:  Bloating and constipation. EXAM: ABDOMEN - 2 VIEW COMPARISON:  None. FINDINGS: There is a moderate amount of stool in the ascending colon. There is no bowel dilatation to suggest obstruction. There is no evidence of pneumoperitoneum, portal venous gas or pneumatosis. There are no pathologic calcifications along the expected course of the ureters. The osseous structures are unremarkable. IMPRESSION: Negative. Electronically Signed   By: Kathreen Devoid   On: 09/02/2015 16:02   Labs: Basic Metabolic Panel:  Recent Labs Lab 09/27/15 1525 09/27/15 1924 09/28/15 0527  NA 132* 132* 130*  K 5.4* 5.1 4.4  CL  --  99* 101  CO2 26 26 25   GLUCOSE 145* 133* 127*  BUN 16.5 16 12   CREATININE 1.2* 1.09* 1.01*  CALCIUM 9.5 8.7* 7.9*  MG  --  1.7  --    Liver Function Tests:  Recent Labs Lab 09/27/15 1525  AST 66*  ALT 50  ALKPHOS 64  BILITOT 0.44  PROT 7.2  ALBUMIN 2.7*   CBC:  Recent Labs Lab 09/27/15 1525 09/27/15 1924 09/28/15 0527 09/29/15 0400  WBC 2.4* 1.8* 1.7* 1.4*  NEUTROABS 1.9 1.3* 1.0* 0.4*  HGB  11.2* 10.1* 9.3* 8.5*  HCT 34.1* 30.5* 27.9* 25.0*  MCV 90.2 90.0 89.1 90.6  PLT 260 236 221 168    Signed:  Barton Dubois  [facility] 09/29/2015, 4:16 PM

## 2015-09-29 NOTE — Evaluation (Signed)
Physical Therapy One Time Evaluation Patient Details Name: INIYAH PINERA MRN: [id number] DOB: 15-Aug-1941 Today's Date: 09/29/2015   History of Present Illness  74 y.o. female with h/o endometrial carcinoma, with peritoneal carcinomatosis that was diagnosed at the end of last month. Pt has required paracentesis(5L removed on 11/22 and 5.2 removed on 12/5).  Pt admitted with tachycardia  Clinical Impression  Patient evaluated by Physical Therapy with no further acute PT needs identified. All education has been completed and the patient has no further questions.  Pt mobilizing well and HR 100 during gait.  Pt denies any symptoms with mobility and wishes to d/c home as soon as possible. See below for any follow-up Physical Therapy or equipment needs. PT is signing off. Thank you for this referral.     Follow Up Recommendations No PT follow up    Equipment Recommendations  None recommended by PT    Recommendations for Other Services       Precautions / Restrictions Precautions Precautions: Fall Precaution Comments: protective precautions - provided pt with mask      Mobility  Bed Mobility Overal bed mobility: Modified Independent                Transfers Overall transfer level: Modified independent                  Ambulation/Gait Ambulation/Gait assistance: Supervision;Modified independent (Device/Increase time) Ambulation Distance (Feet): 250 Feet Assistive device: None Gait Pattern/deviations: Step-through pattern;Decreased stride length     General Gait Details: initially slow pace however able to improved normal speed with cue, pushed IV pole however did not appear to need for suppport  Stairs            Wheelchair Mobility    Modified Rankin (Stroke Patients Only)       Balance Overall balance assessment:  (denies falls)                                           Pertinent Vitals/Pain Pain Assessment: No/denies  pain    Home Living Family/patient expects to be discharged to:: Private residence Living Arrangements: Spouse/significant other   Type of Home: House Home Access: Level entry     Home Layout: One level Home Equipment: None      Prior Function Level of Independence: Independent               Hand Dominance        Extremity/Trunk Assessment   Upper Extremity Assessment: Overall WFL for tasks assessed           Lower Extremity Assessment: Overall WFL for tasks assessed      Cervical / Trunk Assessment: Normal  Communication   Communication: No difficulties  Cognition Arousal/Alertness: Awake/alert Behavior During Therapy: WFL for tasks assessed/performed Overall Cognitive Status: Within Functional Limits for tasks assessed                      General Comments      Exercises        Assessment/Plan    PT Assessment Patent does not need any further PT services  PT Diagnosis Difficulty walking   PT Problem List    PT Treatment Interventions     PT Goals (Current goals can be found in the Care Plan section) Acute Rehab PT Goals PT Goal Formulation: All assessment and  education complete, DC therapy    Frequency     Barriers to discharge        Co-evaluation               End of Session   Activity Tolerance: Patient tolerated treatment well Patient left: in chair;with call bell/phone within reach;with family/visitor present Nurse Communication: Mobility status         Time: 1310-1320 PT Time Calculation (min) (ACUTE ONLY): 10 min   Charges:   PT Evaluation $Initial PT Evaluation Tier I: 1 Procedure     PT G Codes:        Loreda Silverio,KATHrine E 09/29/2015, 2:20 PM Carmelia Bake, PT, DPT 09/29/2015 Pager: [phone]

## 2015-09-30 ENCOUNTER — Telehealth: Payer: Self-pay | Admitting: *Deleted

## 2015-09-30 ENCOUNTER — Other Ambulatory Visit (HOSPITAL_BASED_OUTPATIENT_CLINIC_OR_DEPARTMENT_OTHER): Payer: Commercial Managed Care - HMO

## 2015-09-30 ENCOUNTER — Ambulatory Visit (HOSPITAL_BASED_OUTPATIENT_CLINIC_OR_DEPARTMENT_OTHER): Payer: Commercial Managed Care - HMO | Admitting: Oncology

## 2015-09-30 ENCOUNTER — Telehealth: Payer: Self-pay | Admitting: Oncology

## 2015-09-30 ENCOUNTER — Encounter: Payer: Self-pay | Admitting: Oncology

## 2015-09-30 VITALS — BP 142/64 | HR 103 | Temp 98.9°F | Resp 20 | Ht 66.0 in | Wt 174.9 lb

## 2015-09-30 DIAGNOSIS — D702 Other drug-induced agranulocytosis: Secondary | ICD-10-CM

## 2015-09-30 DIAGNOSIS — R180 Malignant ascites: Secondary | ICD-10-CM

## 2015-09-30 DIAGNOSIS — C786 Secondary malignant neoplasm of retroperitoneum and peritoneum: Secondary | ICD-10-CM

## 2015-09-30 DIAGNOSIS — D701 Agranulocytosis secondary to cancer chemotherapy: Secondary | ICD-10-CM | POA: Diagnosis not present

## 2015-09-30 DIAGNOSIS — C801 Malignant (primary) neoplasm, unspecified: Principal | ICD-10-CM

## 2015-09-30 DIAGNOSIS — T451X5A Adverse effect of antineoplastic and immunosuppressive drugs, initial encounter: Secondary | ICD-10-CM

## 2015-09-30 DIAGNOSIS — C541 Malignant neoplasm of endometrium: Secondary | ICD-10-CM

## 2015-09-30 DIAGNOSIS — D6481 Anemia due to antineoplastic chemotherapy: Secondary | ICD-10-CM | POA: Diagnosis not present

## 2015-09-30 DIAGNOSIS — N183 Chronic kidney disease, stage 3 unspecified: Secondary | ICD-10-CM

## 2015-09-30 DIAGNOSIS — R000 Tachycardia, unspecified: Secondary | ICD-10-CM

## 2015-09-30 LAB — CBC WITH DIFFERENTIAL/PLATELET
BASO%: 0.7 % (ref 0.0–2.0)
BASOS ABS: 0 10*3/uL (ref 0.0–0.1)
EOS ABS: 0 10*3/uL (ref 0.0–0.5)
EOS%: 1.2 % (ref 0.0–7.0)
HEMATOCRIT: 31.1 % — AB (ref 34.8–46.6)
HEMOGLOBIN: 10 g/dL — AB (ref 11.6–15.9)
LYMPH#: 0.8 10*3/uL — AB (ref 0.9–3.3)
LYMPH%: 30.1 % (ref 14.0–49.7)
MCH: 29.2 pg (ref 25.1–34.0)
MCHC: 32.1 g/dL (ref 31.5–36.0)
MCV: 91.1 fL (ref 79.5–101.0)
MONO#: 1.3 10*3/uL — AB (ref 0.1–0.9)
MONO%: 51.5 % — AB (ref 0.0–14.0)
NEUT%: 16.5 % — ABNORMAL LOW (ref 38.4–76.8)
NEUTROS ABS: 0.4 10*3/uL — AB (ref 1.5–6.5)
PLATELETS: 237 10*3/uL (ref 145–400)
RBC: 3.41 10*6/uL — ABNORMAL LOW (ref 3.70–5.45)
RDW: 12.3 % (ref 11.2–14.5)
WBC: 2.6 10*3/uL — AB (ref 3.9–10.3)

## 2015-09-30 LAB — COMPREHENSIVE METABOLIC PANEL
ALT: 37 U/L (ref 0–55)
ANION GAP: 7 meq/L (ref 3–11)
AST: 45 U/L — ABNORMAL HIGH (ref 5–34)
Albumin: 2.2 g/dL — ABNORMAL LOW (ref 3.5–5.0)
Alkaline Phosphatase: 76 U/L (ref 40–150)
BUN: 9.6 mg/dL (ref 7.0–26.0)
CALCIUM: 8.4 mg/dL (ref 8.4–10.4)
CHLORIDE: 104 meq/L (ref 98–109)
CO2: 23 meq/L (ref 22–29)
Creatinine: 1 mg/dL (ref 0.6–1.1)
EGFR: 69 mL/min/{1.73_m2} — ABNORMAL LOW (ref >90)
Glucose: 121 mg/dl (ref 70–140)
POTASSIUM: 4.7 meq/L (ref 3.5–5.1)
Sodium: 133 mEq/L — ABNORMAL LOW (ref 136–145)
Total Bilirubin: 0.35 mg/dL (ref 0.20–1.20)
Total Protein: 6.3 g/dL — ABNORMAL LOW (ref 6.4–8.3)

## 2015-09-30 MED ORDER — TBO-FILGRASTIM 480 MCG/0.8ML ~~LOC~~ SOSY
480.0000 ug | PREFILLED_SYRINGE | Freq: Once | SUBCUTANEOUS | Status: AC
  Administered 2015-09-30: 480 ug via SUBCUTANEOUS
  Filled 2015-09-30: qty 0.8

## 2015-09-30 MED ORDER — CIPROFLOXACIN HCL 250 MG PO TABS: 250.0000 mg | ORAL_TABLET | Freq: Two times a day (BID) | ORAL | Status: DC

## 2015-09-30 MED ORDER — DEXAMETHASONE 4 MG PO TABS: ORAL_TABLET | ORAL | Status: DC

## 2015-09-30 NOTE — Telephone Encounter (Signed)
Appointments made and avs printed for patient °

## 2015-09-30 NOTE — Progress Notes (Signed)
OFFICE PROGRESS NOTE   October 01, 2015   Physicians:Emma Rossi,Plotnikov, [name], MD, Gery Pray, [facility] [name]), [name]  INTERVAL HISTORY:  Patient is seen, together with husband, following hospitalization 09-27-15 thru 09-29-15 for tachycardia related to noncompliance with beta blocker. She became neutropenic by 09-28-15 (day 9  cycle 1) with granix started then, and remains neutropenic tho afebrile today. She had paracentesis for 1.6 liters on 09-28-15. Atenolol was resumed and dose increased from previous 50 mg to 75 mg daily per EMR. She is to see Dr Alain Marion next in Jan.  Patient is fatigued, but not in any acute discomfort now. She is checking temperature at home, afebrile. She has no localizing symptoms of infection and she understands to call for temp >=100.5 or other concerns. She has had no chest pain or cardiac symptoms, no SOB with limited activity,  no bleeding, no nausea or vomiting. Bowels moved with formed stool once since DC. She denies abdominal or pelvic pain now. No LE swelling. She ate eggs and grits this AM, no supplement yet which we discussed. She is voiding. No peripheral neuropathy. She has difficulty sleeping at night, not new. Remainder of 10 point Review of Systems negative.    Flu vaccine 08-09-15 No central line No genetics testing   ONCOLOGIC HISTORY Patient has had regular medical care by PCP Dr Alain Marion and gyn Dr Garwin Brothers. Sometime after a fall in spring 2016, she noticed low pelvic discomfort and some intermittent vaginal spotting. She was seen for regular visit by Dr Garwin Brothers in May 2016, with PAP positive for AGUS and US showing endometrial stripe 8.73m. Endometrial biopsy 03-12-15 showed high grade adenocarcinoma with papillary features, however immunostains were not typical for serous carcinoma. She was seen by Dr RDenman Georgein June. CT CAP 04-14-15 showed no evidence of metastatic disease in chest, a 3.3 x 2.8 cm endometrial mas and  3.1 cm cystic lesion of right ovary. She had robotic total laparoscopic hysterectomy BSO with bilateral pelvic and para aortic node [facility]. Pathology ([phone] grade 2 endometrioid adenocarcinoma invading 0.35 cm where myometrium 1.1 cm, 26 nodes negative and no LVSI. She had intracavitary radiation due to risk for vaginal cuff recurrence from 06-01-15 thru 07-01-15, vaginal cuff 27.5 gray in 5 fractions. There were no obvious concerns when she was seen by Dr RDenman Georgein late July or Dr KSondra Comeand Dr PAlain Marionin October. She presented to ED on 09-06-15 with generalized abdominal pain, bloating and constipation x 1 month. CT AP 09-06-15 showed extensive nodularity and stranding thruout omentum and large volume ascites, with liver/spleen/pancreas/adrenals not remarkable, no bowel obstruction. She was admitted x 24 hours, with UKoreaparacentesi on 09-07-15 for 5 liters of serous fluid, cytology (([id number] adenocarcinoma. SHe was much more comfortable after paracentesis and cannot tell that the fluid has reaccumulated as yet. She had consultation with Dr RDenman Georgeon 09-13-15, recommendation for systemic chemotherapy. Review of cytology from ascites compared with surgical specimen and [facility] high grade features that were seen on the initial endometrial biopsy (not seen on the full surgical path). She had repeat UKoreaparacentesis for 5.2 liters on 09-20-15. She had first carboplatin taxol on 09-21-15 and was neutropenic by day 8 cycle 1, granix begun.   Objective:  Vital signs in last 24 hours:  BP 142/64 mmHg  Pulse 103  Temp(Src) 98.9 F (37.2 C) (Oral)  Resp 20  Ht 5'  6" (1.676 m)  Wt 174 lb 14.4 oz (79.334 kg)  BMI 28.24 kg/m2  SpO2 100% Weight has been fluctuating depending in part on paracenteses. Alert, oriented and appropriate, looks somewhat tired but very pleasant as always.. Ambulatory without  assistance slowly. Respirations not labored room air. .  No alopecia  HEENT:PERRL, sclerae not icteric. Oral mucosa moist without lesions, posterior pharynx clear.  Neck supple. No JVD.  Lymphatics:no cervical,supraclavicular adenopathy Resp: clear to auscultation bilaterally  Cardio: regular rate at 100 , regular rhythm. No gallop. GI: soft, nontender, mildly distended with less fluid than prior to paracentesis on 09-28-15, no discreet mass or organomegaly. Active bowel sounds. Musculoskeletal/ Extremities: without pitting edema, cords, tenderness Neuro: no peripheral neuropathy. Speech fluent, moves all extremities equally, otherwise nonfocal. PSYCH appropriate mood and affect Skin without rash, ecchymosis, petechiae   Lab Results:  Results for orders placed or performed in visit on 09/30/15  Comprehensive metabolic panel  Result Value Ref Range   Sodium 133 (L) 136 - 145 mEq/L   Potassium 4.7 3.5 - 5.1 mEq/L   Chloride 104 98 - 109 mEq/L   CO2 23 22 - 29 mEq/L   Glucose 121 70 - 140 mg/dl   BUN 9.6 7.0 - 69.6 mg/dL   Creatinine 1.0 0.6 - 1.1 mg/dL   Total Bilirubin 2.95 0.20 - 1.20 mg/dL   Alkaline Phosphatase 76 40 - 150 U/L   AST 45 (H) 5 - 34 U/L   ALT 37 0 - 55 U/L   Total Protein 6.3 (L) 6.4 - 8.3 g/dL   Albumin 2.2 (L) 3.5 - 5.0 g/dL   Calcium 8.4 8.4 - 28.4 mg/dL   Anion Gap 7 3 - 11 mEq/L   EGFR 69 (L) >90 ml/min/1.73 m2  CBC with Differential  Result Value Ref Range   WBC 2.6 (L) 3.9 - 10.3 10e3/uL   NEUT# 0.4 (LL) 1.5 - 6.5 10e3/uL   HGB 10.0 (L) 11.6 - 15.9 g/dL   HCT 13.2 (L) 44.0 - 10.2 %   Platelets 237 145 - 400 10e3/uL   MCV 91.1 79.5 - 101.0 fL   MCH 29.2 25.1 - 34.0 pg   MCHC 32.1 31.5 - 36.0 g/dL   RBC 7.25 (L) 3.66 - 4.40 10e6/uL   RDW 12.3 11.2 - 14.5 %   lymph# 0.8 (L) 0.9 - 3.3 10e3/uL   MONO# 1.3 (H) 0.1 - 0.9 10e3/uL   Eosinophils Absolute 0.0 0.0 - 0.5 10e3/uL   Basophils Absolute 0.0 0.0 - 0.1 10e3/uL   NEUT% 16.5 (L) 38.4 - 76.8 %    LYMPH% 30.1 14.0 - 49.7 %   MONO% 51.5 (H) 0.0 - 14.0 %   EOS% 1.2 0.0 - 7.0 %   BASO% 0.7 0.0 - 2.0 %     Studies/Results:  No results found.  Medications: I have reviewed the patient's current medications.  She understands continuing beta blocker Cipro 250 mg bid begun in hospital but not prescribed at DC, will send this to pharmacy as I prefer this while ANC <0.5. She has low dose ativan available for chemo nausea, which she could use sparingly at hs if unable to sleep.   DISCUSSION: reviewed pertinent information from hospital with patient and husband. Reviewed neutropenic precautions, these instructions given oral and written with office phone #. Discussed diet, encouraged supplements and small amounts at frequent intervals. Discussed activity, explaining to husband that she should not push herself to exercise now. Note husband very supportive but seems to have  difficulty understanding whole situation, also noted by unit RN in hospital.  Assessment/Plan: 1.Sinus tachycardia: sustained at 150 - 160 on admission 09-27-15,  related to inadvertent DC of beta blocker as well as intravascular fluid depletion, no PE by CTA chest. Back on atenolol, better hydrated, tho hydration will continue to be problematic until the malignant ascites improves with chemo recently begun. TSH 0.88 and echocardiogram with good EF. HR 100 today, tolerating well. 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 Massive ascites, with 5 liters removed 09-07-15 ,  5.2 liters on 09-20-15, and 1.6 liters on 09-28-15. Chemotherapy begun with carbo taxol 09-21-15, counts at or just past nadir now. WIll adjust carbo for cycle 2, note previous treatment including radiation for anal cancer may be impacting counts. 3. Chemo neutropenia: ANC still just 0.4 today, afebrile. Started daily granix 09-28-15  which will be [facility] at [facility], 12-16 and 10-02-15. Will repeat CBC and give granix on 10-04-15 if ANC < 1.2. . Neutropenic precautions.  Prophylactic oral cipro as ANC <0.5, had at least 1 dose in hospital but apparently not continued at DC. Discussed possible aches from gCSF 4.chemo anemia: not to point of requiring transfusion now. Platelets still ok. 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 May be difficult to hydrate adequately even with IVF until malignant ascites responds to chemo. Now off HCTZ and now off spironolactone. No hydronephrosis by CT late Nov. Would be cautious about IV contrast if not necessary, did tolerate CTA chest contrast on admission. 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 I do not believe that she has cardiologist 10.post cholecystectomy and up to date colonoscopy and mammograms 11. Poor nutritional status: needs to increase supplements, discussed. Appreciate assistance from hospital nutritionist on 09-28-15 and may need follow up with [facility] dietician. 12.squamous cell carcinoma anus 2003: radiation and may have had chemo then, may be impacting counts with present chemo. Records will be requested from archives.  All questions answered and patient/ husband understand major points above, know that they can call at any time if questions or concerns. Granix orders  with parameters for 10-04-15  placed. If counts are recovering by early next week, we hope to be able to keep cycle 2 chemo on schedule week of 12-27 ( day may change depending on infusion availability the holiday week). MD visit + lab 12-27. She will need gCSF after each upcoming carbo taxol chemotherapy. MD visit + lab 10-19-15. Time spent 30 min including >50% counseling and coordination of care. Cc Dr Alain Marion and [name] to update. Message to nursing re CBC/ possible gCSF 10-04-15.   Oluwatosin Bracy P, MD   10/01/2015, 7:48 AM

## 2015-09-30 NOTE — Telephone Encounter (Signed)
TC from patient this am to confirm her appts for today with the lab and Dr. Marko Plume.  Confirmed appts with her for 3pm and 3:30 pm. She stated she will be here @ that time.

## 2015-10-01 ENCOUNTER — Ambulatory Visit (HOSPITAL_BASED_OUTPATIENT_CLINIC_OR_DEPARTMENT_OTHER): Payer: Commercial Managed Care - HMO

## 2015-10-01 ENCOUNTER — Telehealth: Payer: Self-pay | Admitting: *Deleted

## 2015-10-01 ENCOUNTER — Ambulatory Visit: Payer: Commercial Managed Care - HMO | Admitting: Gynecologic Oncology

## 2015-10-01 VITALS — BP 132/71 | HR 108 | Temp 99.4°F

## 2015-10-01 DIAGNOSIS — D6481 Anemia due to antineoplastic chemotherapy: Secondary | ICD-10-CM | POA: Insufficient documentation

## 2015-10-01 DIAGNOSIS — D701 Agranulocytosis secondary to cancer chemotherapy: Secondary | ICD-10-CM | POA: Diagnosis not present

## 2015-10-01 DIAGNOSIS — C541 Malignant neoplasm of endometrium: Secondary | ICD-10-CM

## 2015-10-01 DIAGNOSIS — C786 Secondary malignant neoplasm of retroperitoneum and peritoneum: Secondary | ICD-10-CM | POA: Diagnosis not present

## 2015-10-01 DIAGNOSIS — T451X5A Adverse effect of antineoplastic and immunosuppressive drugs, initial encounter: Secondary | ICD-10-CM

## 2015-10-01 DIAGNOSIS — D702 Other drug-induced agranulocytosis: Secondary | ICD-10-CM | POA: Insufficient documentation

## 2015-10-01 DIAGNOSIS — C801 Malignant (primary) neoplasm, unspecified: Principal | ICD-10-CM

## 2015-10-01 LAB — CA 125: CA 125: 1791 U/mL — ABNORMAL HIGH (ref <35)

## 2015-10-01 MED ORDER — TBO-FILGRASTIM 480 MCG/0.8ML ~~LOC~~ SOSY
480.0000 ug | PREFILLED_SYRINGE | Freq: Once | SUBCUTANEOUS | Status: AC
  Administered 2015-10-01: 480 ug via SUBCUTANEOUS
  Filled 2015-10-01: qty 0.8

## 2015-10-01 NOTE — Telephone Encounter (Signed)
Transition Care Management Follow-up Telephone Call   Date discharged? 09/29/15   How have you been since you were released from the hospital? Pt states she is doing alright   Do you understand why you were in the hospital? YES   Do you understand the discharge instructions? YES   Where were you discharged to? Home   Items Reviewed:  Medications reviewed: YES  Allergies reviewed: YES  Dietary changes reviewed: YES, low sodium  Referrals reviewed: No referral   Functional Questionnaire:   Activities of Daily Living (ADLs):   She states she are independent in the following: ambulation, bathing and hygiene, feeding, continence, grooming, toileting and dressing States she require assistance with the following: ambulation sometimes   Any transportation issues/concerns?: NO   Any patient concerns? YES   Confirmed importance and date/time of follow-up visits scheduled YES, made 10/07/15  Provider Appointment booked with Dr. Alain Marion  Confirmed with patient if condition begins to worsen call PCP or go to the ER.  Patient was given the office number and encouraged to call back with question or concerns.  : YES

## 2015-10-02 ENCOUNTER — Other Ambulatory Visit: Payer: Self-pay | Admitting: Oncology

## 2015-10-02 ENCOUNTER — Ambulatory Visit (HOSPITAL_BASED_OUTPATIENT_CLINIC_OR_DEPARTMENT_OTHER): Payer: Commercial Managed Care - HMO

## 2015-10-02 VITALS — BP 141/80 | HR 115 | Temp 99.3°F | Resp 20

## 2015-10-02 DIAGNOSIS — C786 Secondary malignant neoplasm of retroperitoneum and peritoneum: Secondary | ICD-10-CM

## 2015-10-02 DIAGNOSIS — D701 Agranulocytosis secondary to cancer chemotherapy: Secondary | ICD-10-CM | POA: Diagnosis not present

## 2015-10-02 MED ORDER — TBO-FILGRASTIM 480 MCG/0.8ML ~~LOC~~ SOSY
480.0000 ug | PREFILLED_SYRINGE | Freq: Once | SUBCUTANEOUS | Status: AC
  Administered 2015-10-02: 480 ug via SUBCUTANEOUS

## 2015-10-02 NOTE — Patient Instructions (Signed)

## 2015-10-04 ENCOUNTER — Ambulatory Visit: Payer: Commercial Managed Care - HMO

## 2015-10-04 ENCOUNTER — Other Ambulatory Visit (HOSPITAL_BASED_OUTPATIENT_CLINIC_OR_DEPARTMENT_OTHER): Payer: Commercial Managed Care - HMO

## 2015-10-04 DIAGNOSIS — C786 Secondary malignant neoplasm of retroperitoneum and peritoneum: Secondary | ICD-10-CM | POA: Diagnosis not present

## 2015-10-04 DIAGNOSIS — C541 Malignant neoplasm of endometrium: Secondary | ICD-10-CM

## 2015-10-04 DIAGNOSIS — C801 Malignant (primary) neoplasm, unspecified: Principal | ICD-10-CM

## 2015-10-04 LAB — CBC WITH DIFFERENTIAL/PLATELET
BASO%: 0.3 % (ref 0.0–2.0)
Basophils Absolute: 0.1 10*3/uL (ref 0.0–0.1)
EOS%: 0.6 % (ref 0.0–7.0)
Eosinophils Absolute: 0.1 10*3/uL (ref 0.0–0.5)
HCT: 31 % — ABNORMAL LOW (ref 34.8–46.6)
HGB: 10 g/dL — ABNORMAL LOW (ref 11.6–15.9)
LYMPH%: 5.6 % — AB (ref 14.0–49.7)
MCH: 29.1 pg (ref 25.1–34.0)
MCHC: 32.2 g/dL (ref 31.5–36.0)
MCV: 90.3 fL (ref 79.5–101.0)
MONO#: 1.4 10*3/uL — AB (ref 0.1–0.9)
MONO%: 7.3 % (ref 0.0–14.0)
NEUT%: 86.2 % — ABNORMAL HIGH (ref 38.4–76.8)
NEUTROS ABS: 16.4 10*3/uL — AB (ref 1.5–6.5)
PLATELETS: 194 10*3/uL (ref 145–400)
RBC: 3.43 10*6/uL — AB (ref 3.70–5.45)
RDW: 12.6 % (ref 11.2–14.5)
WBC: 19 10*3/uL — AB (ref 3.9–10.3)
lymph#: 1.1 10*3/uL (ref 0.9–3.3)

## 2015-10-04 NOTE — Progress Notes (Signed)
Sharon Holt here for lab and possible injection of Granix.  ANC 16.4 today does not need Granix injection today.  Knows to call if she has any problems before return visit on 10/12/15.

## 2015-10-06 ENCOUNTER — Other Ambulatory Visit: Payer: Self-pay | Admitting: Oncology

## 2015-10-06 ENCOUNTER — Telehealth: Payer: Self-pay

## 2015-10-06 NOTE — Telephone Encounter (Signed)
Reviewed choices of Granix and nuelasta as noted below by Dr. Marko Plume. Sharon Holt wants to try the neulasta.  Injection would be on 10-15-15. Pof sent to schedulers.   Dr. Marko Plume notified of choice so prior authorization for medication can be obtained.

## 2015-10-06 NOTE — Telephone Encounter (Signed)
-----   Message from Gordy Levan, MD sent at 10/02/2015 10:04 AM EST ----- For CBC and possible granix on 12-19 : granix 12-19 if Erick <1.2 and Lennis needs to be sure to see CBC, as in previous message  Nurse please speak with patient on 12-19 about gCSF choices after treatment 12-28, as she was neutropenic by day 8 cycle 1:  1) granix x several days: 12-29, 12-30, 12-31 and with MD visit 1-3 if so. OR 2) neulasta at [facility] or by [facility] (12-30 or at home 12-29) as single injection. She may have more aches from the neulasta, tho her taxol aches were not terrible cycle 1. Less trips to office and may recover infection fighting blood cells more quickly  Will have to set up necessary injection appointments. WIll need preauth for neulasta if that is decision- Lennis has to do that now if so, as next MD apt is 12-27. Will need orders   Thank you

## 2015-10-07 ENCOUNTER — Ambulatory Visit: Payer: Commercial Managed Care - HMO | Admitting: Oncology

## 2015-10-07 ENCOUNTER — Other Ambulatory Visit: Payer: Self-pay | Admitting: Oncology

## 2015-10-07 ENCOUNTER — Ambulatory Visit (INDEPENDENT_AMBULATORY_CARE_PROVIDER_SITE_OTHER): Payer: Commercial Managed Care - HMO | Admitting: Internal Medicine

## 2015-10-07 ENCOUNTER — Telehealth: Payer: Self-pay | Admitting: Oncology

## 2015-10-07 ENCOUNTER — Encounter: Payer: Self-pay | Admitting: Internal Medicine

## 2015-10-07 ENCOUNTER — Other Ambulatory Visit: Payer: Commercial Managed Care - HMO

## 2015-10-07 VITALS — BP 100/70 | HR 106 | Wt 174.0 lb

## 2015-10-07 DIAGNOSIS — C801 Malignant (primary) neoplasm, unspecified: Secondary | ICD-10-CM | POA: Diagnosis not present

## 2015-10-07 DIAGNOSIS — R000 Tachycardia, unspecified: Secondary | ICD-10-CM

## 2015-10-07 DIAGNOSIS — I10 Essential (primary) hypertension: Secondary | ICD-10-CM | POA: Diagnosis not present

## 2015-10-07 DIAGNOSIS — C799 Secondary malignant neoplasm of unspecified site: Secondary | ICD-10-CM

## 2015-10-07 DIAGNOSIS — R180 Malignant ascites: Secondary | ICD-10-CM | POA: Diagnosis not present

## 2015-10-07 NOTE — Progress Notes (Signed)
Subjective:  Patient ID: Sharon Holt, female    DOB: 30-Sep-1941  Age: 74 y.o. MRN: [id number]  CC: No chief complaint on file.   HPI Sharon Holt presents for ascitis, tachycardia, ovarian ca f/u. Post-hosp f/up:   Admit date: 09/27/2015 Discharge date: 09/29/2015  Time spent: 35 minutes  Recommendations for Outpatient Follow-up:  1. Repeat BMET to follow electrolytes and renal function  2. Reassess BP and adjust antihypertensive regimen 3. Please follow volume closely and initiate appropriate diuretic regimen if needed   Discharge Diagnoses:  Sinus tachycardia ([place]) Peritoneal carcinomatosis ([place]) Hyperkalemia CKD stage 3 Hyperkalemia HTN Moderate protein calorie malnutrition  Anemia/neutropenia: associated with chemotherapy    Discharge Condition: stable and improved. Discharge home with follow up with PCP in 1 week and outpatient follow up by oncology service   Diet recommendation: low sodium diet  Filed Weights   09/28/15 0554  Weight: 80.513 kg (177 lb 8 oz)    History of present illness:  74 y.o. female with h/o endometrial carcinoma, with peritoneal carcinomatosis that was diagnosed at the end of last month. Has since undergone 2 paricentesis with massive volume ascites. 5L removed on 11/22 and 5.2 removed on 12/5. Patient was started on spironolactone.  It seems that she missunderstood her PCP after the office visit on 12/2 where he states in his note to continue taking her atenolol for HTN. She discontinued her atenolol at that time and has been off of it ever since.  Hospital Course:  1-sinus tachycardia: due to B-blocker rebound and intravascular depletion -improved/resolved with IVF's and resumption of atenolol (75mg  daily) -patient feeling ok -will follow up with PCP in 1 week  2-Abdominal Carcinomatosis: with recurrent malignant ascites  -will follow up with oncology as an outpatient  -continue chemotherapy -PRN  paracentesis  -might need resumption of lasix and spironolactone down the road to help with volume control  3-anemia/neutropenia: due to chemotherapy -follow up with oncology service as an outpatient  4-CKD stage 3: stable and at baseline -advise to maintain good hydration -diuretics on hold at discharge -1.6 L removed during this admission from paracentesis   5-essential HTN: continue atenolol -advise to follow low sodium diet -reassessment of BP and adjustment to antihypertensive regimen to be done by PCP  6-moderate protein malnutrition: -will discharge on ensure ans Prostat  -appreciate nutritional service   7-hyperkalemia: due to use of spironolactone and potassium supplements  - resolved with IVF's and after stopping supplementation and spironolactone   8-chronic diastolic heart failure: stage 2; preserved EF -advise to follow low sodium diet -will need close follow up of volume status and initiation of lasix -continue B-blocker   Procedures:  Paracentesis (09/28/15; 1.6 L blood-tinged fluid removed)  Consultations:  IR  Oncology  Discharge Exam: Filed Vitals:   09/29/15 0644 09/29/15 1254  BP: 130/69 152/68  Pulse: 113 94  Temp: 99.8 F (37.7 C) 99.4 F (37.4 C)  Resp: 20 18    General: afebrile, no CP, no SOB and asking to be discharge Cardiovascular: S1 and S2, no rubs or gallops Respiratory: CTA bilaterally Abd: non tender; positive BS Extremities: no cyanosis and no clubbing   Discharge Instructions   Discharge Instructions    Diet - low sodium heart healthy  Complete by: As directed      Discharge instructions  Complete by: As directed   Maintain adequate hydration Minimize sodium intake Take medications as prescribed Follow up with PCP in 1 week  Outpatient Prescriptions Prior to Visit  Medication Sig Dispense Refill  . Amino Acids-Protein Hydrolys (FEEDING SUPPLEMENT, PRO-STAT SUGAR  FREE 64,) LIQD Take 30 mLs by mouth 2 (two) times daily. 900 mL 0  . atenolol (TENORMIN) 25 MG tablet Take 3 tablets (75 mg total) by mouth daily. 45 tablet 1  . Cholecalciferol (VITAMIN D3) 2000 UNITS capsule Take 2,000 Units by mouth daily.     . ciprofloxacin (CIPRO) 250 MG tablet Take 1 tablet (250 mg total) by mouth 2 (two) times daily. 10 tablet 0  . clorazepate (TRANXENE) 7.5 MG tablet Take 1 tablet (7.5 mg total) by mouth 2 (two) times daily as needed for anxiety. 180 tablet 1  . dexamethasone (DECADRON) 4 MG tablet Take 5 tablets by mouth with food 12 hours and 6 hours prior to taxol chemotherapy. 10 tablet 0  . feeding supplement, ENSURE ENLIVE, (ENSURE ENLIVE) LIQD Take 237 mLs by mouth 2 (two) times daily between meals. 237 mL 12  . LORazepam (ATIVAN) 0.5 MG tablet Take 1 tablet by mouth every 6 (six) hours as needed (nausea.).   0  . Multiple Vitamins-Minerals (CENTRUM SILVER PO) Take 1 capsule by mouth daily.     . ondansetron (ZOFRAN) 8 MG tablet Take 0.5-1 tablets (4-8 mg total) by mouth every 8 (eight) hours as needed for nausea, vomiting or refractory nausea / vomiting. Will NOT make drowsy. 30 tablet 2  . predniSONE (DELTASONE) 5 MG tablet Take 2.5 mg by mouth every other day.     No facility-administered medications prior to visit.    ROS Review of Systems  Constitutional: Positive for fatigue. Negative for chills, activity change, appetite change and unexpected weight change.  HENT: Negative for congestion, mouth sores and sinus pressure.   Eyes: Negative for visual disturbance.  Respiratory: Negative for cough and chest tightness.   Gastrointestinal: Positive for nausea. Negative for vomiting and abdominal pain.  Genitourinary: Negative for frequency, difficulty urinating and vaginal pain.  Musculoskeletal: Negative for back pain and gait problem.  Skin: Negative for pallor and rash.  Neurological: Negative for dizziness, tremors, weakness, numbness and headaches.    Psychiatric/Behavioral: Negative for suicidal ideas, confusion and sleep disturbance.  feeling better Poor appetite at times  Objective:  BP 100/70 mmHg  Pulse 106  Wt 174 lb (78.926 kg)  SpO2 97%  BP Readings from Last 3 Encounters:  10/07/15 100/70  10/02/15 141/80  10/01/15 132/71    Wt Readings from Last 3 Encounters:  10/07/15 174 lb (78.926 kg)  09/30/15 174 lb 14.4 oz (79.334 kg)  09/28/15 177 lb 8 oz (80.513 kg)    Physical Exam  Constitutional: She appears well-developed. No distress.  HENT:  Head: Normocephalic.  Right Ear: External ear normal.  Left Ear: External ear normal.  Nose: Nose normal.  Mouth/Throat: Oropharynx is clear and moist.  Eyes: Conjunctivae are normal. Pupils are equal, round, and reactive to light. Right eye exhibits no discharge. Left eye exhibits no discharge.  Neck: Normal range of motion. Neck supple. No JVD present. No tracheal deviation present. No thyromegaly present.  Cardiovascular: Normal rate, regular rhythm and normal heart sounds.   Pulmonary/Chest: No stridor. No respiratory distress. She has no wheezes.  Abdominal: Soft. Bowel sounds are normal. She exhibits no distension and no mass. There is no tenderness. There is no rebound and no guarding.  Musculoskeletal: She exhibits no edema or tenderness.  Lymphadenopathy:    She has no cervical adenopathy.  Neurological: She displays normal reflexes. No  cranial nerve deficit. She exhibits normal muscle tone. Coordination normal.  Skin: No rash noted. No erythema.  Psychiatric: She has a normal mood and affect. Her behavior is normal. Judgment and thought content normal.  pt looks better abd is softer  Lab Results  Component Value Date   WBC 19.0* 10/04/2015   HGB 10.0* 10/04/2015   HCT 31.0* 10/04/2015   PLT 194 10/04/2015   GLUCOSE 121 09/30/2015   CHOL 185 09/25/2014   TRIG 86.0 09/25/2014   HDL 63.70 09/25/2014   LDLDIRECT 100.6 02/13/2012   LDLCALC 104* 09/25/2014    ALT 37 09/30/2015   AST 45* 09/30/2015   NA 133* 09/30/2015   K 4.7 09/30/2015   CL 101 09/28/2015   CREATININE 1.0 09/30/2015   BUN 9.6 09/30/2015   CO2 23 09/30/2015   TSH 0.881 09/27/2015   HGBA1C 5.4 02/13/2013    Dg Chest 2 View  09/27/2015  CLINICAL DATA:  74 year old female with history of rapid heart rate today. History of gynecologic cancer with large volume of ascites. Chemotherapy. EXAM: CHEST  2 VIEW COMPARISON:  Chest x-ray 12/26/2010. FINDINGS: Lung volumes are low. No consolidative airspace disease. No pleural effusions. No pneumothorax. No pulmonary nodule or mass noted. Pulmonary vasculature and the cardiomediastinal silhouette are within normal limits. Surgical clips project over the right upper quadrant of the abdomen, compatible with prior cholecystectomy. IMPRESSION: 1. Low lung volumes without radiographic evidence of acute cardiopulmonary disease. Electronically Signed   By: Vinnie Langton M.D.   On: 09/27/2015 19:27   Ct Angio Chest Pe W/cm &/or Wo Cm  09/27/2015  CLINICAL DATA:  Shortness of breath and tachycardia since tonight. History of anal cancer 15 years ago. Currently on chemotherapy and radiation therapy for endometrial cancer. Chronic hepatitis. Nonsmoker. EXAM: CT ANGIOGRAPHY CHEST WITH CONTRAST TECHNIQUE: Multidetector CT imaging of the chest was performed using the standard protocol during bolus administration of intravenous contrast. Multiplanar CT image reconstructions and MIPs were obtained to evaluate the vascular anatomy. CONTRAST:  153mL OMNIPAQUE IOHEXOL 350 MG/ML SOLN COMPARISON:  04/14/2015 FINDINGS: Technically adequate study with good opacification of the central and segmental pulmonary arteries. No focal filling defects demonstrated. No evidence of significant pulmonary embolus. Normal heart size. Normal caliber thoracic aorta. No aortic dissection. Great vessel origins are patent. Scattered calcification in the aorta. Small esophageal hiatal  hernia. Esophagus is decompressed. No significant lymphadenopathy in the chest. Sub cm left thyroid gland nodule. No followup indicated due to small size appears Evaluation of the lungs is limited due to respiratory motion artifact. There is linear atelectasis in the lung bases. Thin-walled cyst in the left lingula. Dependent atelectasis in the lung bases. 6 mm nodule in the superior segment left lower lung was not present previously. Six-month follow-up study suggested. No focal consolidation. No pleural effusions. No pneumothorax. Included portions of the upper abdominal organs demonstrate moderate free fluid in the upper abdomen likely representing ascites. Abdominal fluid was not present on the previous study in this may represent reactive change related to radiation therapy. Other etiologies are not excluded. Surgical absence of the gallbladder. Degenerative changes in the spine. No destructive bone lesions are appreciated. Review of the MIP images confirms the above findings. IMPRESSION: No evidence of significant pulmonary embolus. 6 mm nodule in the superior segment left lower lung. Suggest six-month follow-up. No focal consolidation in the lungs. Moderate free fluid in the upper abdomen may indicate ascites. Electronically Signed   By: Lucienne Capers M.D.   On: 09/27/2015  22:40   US Paracentesis  09/28/2015  INDICATION: Endometrial cancer, recurrent ascites. Request is made for therapeutic paracentesis. EXAM: ULTRASOUND-GUIDED THERAPEUTIC PARACENTESIS COMPARISON:  Prior paracentesis on 09/20/2015 MEDICATIONS: None. COMPLICATIONS: None immediate TECHNIQUE: Informed written consent was obtained from the patient after a discussion of the risks, benefits and alternatives to treatment. A timeout was performed prior to the initiation of the procedure. Initial ultrasound scanning demonstrates a amount of ascites within the right lower abdominal quadrant. The right lower abdomen was prepped and draped in the  usual sterile fashion. 1% lidocaine was used for local anesthesia. Under direct ultrasound guidance, a 19 gauge, 10-cm, Yueh catheter was introduced. An ultrasound image was saved for documentation purposed. The paracentesis was performed. The catheter was removed and a dressing was applied. The patient tolerated the procedure well without immediate post procedural complication. FINDINGS: A total of approximately 1.6 liters of blood-tinged fluid was removed. IMPRESSION: Successful ultrasound-guided therapeutic paracentesis yielding 1.6 liters of peritoneal fluid. Read by: Rowe Robert, PA-C Electronically Signed   By: Markus Daft M.D.   On: 09/28/2015 16:39    Assessment & Plan:   There are no diagnoses linked to this encounter. I am having Sharon Holt maintain her Multiple Vitamins-Minerals (CENTRUM SILVER PO), Vitamin D3, clorazepate, ondansetron, LORazepam, predniSONE, atenolol, feeding supplement (ENSURE ENLIVE), feeding supplement (PRO-STAT SUGAR FREE 64), ciprofloxacin, and dexamethasone.  No orders of the defined types were placed in this encounter.     Follow-up: No Follow-up on file.  Walker Kehr, MD

## 2015-10-07 NOTE — Assessment & Plan Note (Signed)
12/16 volume depletion related - corrected

## 2015-10-07 NOTE — Progress Notes (Signed)
Pre visit review using our clinic review tool, if applicable. No additional management support is needed unless otherwise documented below in the visit note. 

## 2015-10-07 NOTE — Telephone Encounter (Signed)
Appointments for injection made and patient will get at 12/27 appointment

## 2015-10-07 NOTE — Assessment & Plan Note (Signed)
Back on ATENOLOL 12/16

## 2015-10-07 NOTE — Assessment & Plan Note (Signed)
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 Most likely this is associated with endometrial carcinoma, tho this appeared stage 1 grade 2 at [facility] BSO and node evaluation 04-20-15. Immunostains pending on ascites specimen; as long as nothing unexpected with this, she will begin q 3 week carbo taxol in near future. We will let her know about pathology information by phone. Chemo education class this week. Prn paracenteses now. 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   Doing better

## 2015-10-07 NOTE — Assessment & Plan Note (Signed)
Paracentesis prn

## 2015-10-11 ENCOUNTER — Other Ambulatory Visit: Payer: Self-pay | Admitting: Oncology

## 2015-10-12 ENCOUNTER — Encounter: Payer: Self-pay | Admitting: Oncology

## 2015-10-12 ENCOUNTER — Other Ambulatory Visit (HOSPITAL_BASED_OUTPATIENT_CLINIC_OR_DEPARTMENT_OTHER): Payer: Commercial Managed Care - HMO

## 2015-10-12 ENCOUNTER — Telehealth: Payer: Self-pay

## 2015-10-12 ENCOUNTER — Ambulatory Visit (HOSPITAL_COMMUNITY)
Admission: RE | Admit: 2015-10-12 | Discharge: 2015-10-12 | Disposition: A | Discharge status: Home / Self Care | Payer: Commercial Managed Care - HMO | Source: Ambulatory Visit | Attending: Oncology | Admitting: Oncology

## 2015-10-12 ENCOUNTER — Telehealth: Payer: Self-pay | Admitting: Oncology

## 2015-10-12 ENCOUNTER — Ambulatory Visit (HOSPITAL_BASED_OUTPATIENT_CLINIC_OR_DEPARTMENT_OTHER): Payer: Commercial Managed Care - HMO | Admitting: Oncology

## 2015-10-12 ENCOUNTER — Ambulatory Visit: Payer: Commercial Managed Care - HMO | Admitting: Oncology

## 2015-10-12 VITALS — BP 136/65 | HR 99 | Temp 98.3°F | Resp 18 | Ht 66.0 in | Wt 173.1 lb

## 2015-10-12 DIAGNOSIS — K5900 Constipation, unspecified: Secondary | ICD-10-CM

## 2015-10-12 DIAGNOSIS — T451X5A Adverse effect of antineoplastic and immunosuppressive drugs, initial encounter: Secondary | ICD-10-CM

## 2015-10-12 DIAGNOSIS — K219 Gastro-esophageal reflux disease without esophagitis: Secondary | ICD-10-CM | POA: Diagnosis not present

## 2015-10-12 DIAGNOSIS — R140 Abdominal distension (gaseous): Secondary | ICD-10-CM | POA: Diagnosis present

## 2015-10-12 DIAGNOSIS — C541 Malignant neoplasm of endometrium: Secondary | ICD-10-CM | POA: Diagnosis not present

## 2015-10-12 DIAGNOSIS — C786 Secondary malignant neoplasm of retroperitoneum and peritoneum: Secondary | ICD-10-CM

## 2015-10-12 DIAGNOSIS — E46 Unspecified protein-calorie malnutrition: Secondary | ICD-10-CM | POA: Diagnosis not present

## 2015-10-12 DIAGNOSIS — N183 Chronic kidney disease, stage 3 unspecified: Secondary | ICD-10-CM

## 2015-10-12 DIAGNOSIS — C801 Malignant (primary) neoplasm, unspecified: Secondary | ICD-10-CM

## 2015-10-12 DIAGNOSIS — D701 Agranulocytosis secondary to cancer chemotherapy: Secondary | ICD-10-CM

## 2015-10-12 LAB — CBC WITH DIFFERENTIAL/PLATELET
BASO%: 0.8 % (ref 0.0–2.0)
BASOS ABS: 0 10*3/uL (ref 0.0–0.1)
EOS ABS: 0 10*3/uL (ref 0.0–0.5)
EOS%: 0.2 % (ref 0.0–7.0)
HEMATOCRIT: 29 % — AB (ref 34.8–46.6)
HGB: 9.5 g/dL — ABNORMAL LOW (ref 11.6–15.9)
LYMPH#: 1.3 10*3/uL (ref 0.9–3.3)
LYMPH%: 28.1 % (ref 14.0–49.7)
MCH: 29.7 pg (ref 25.1–34.0)
MCHC: 32.9 g/dL (ref 31.5–36.0)
MCV: 90.4 fL (ref 79.5–101.0)
MONO#: 0.6 10*3/uL (ref 0.1–0.9)
MONO%: 13.8 % (ref 0.0–14.0)
NEUT#: 2.6 10*3/uL (ref 1.5–6.5)
NEUT%: 57.1 % (ref 38.4–76.8)
PLATELETS: 437 10*3/uL — AB (ref 145–400)
RBC: 3.2 10*6/uL — AB (ref 3.70–5.45)
RDW: 13.5 % (ref 11.2–14.5)
WBC: 4.6 10*3/uL (ref 3.9–10.3)

## 2015-10-12 LAB — BASIC METABOLIC PANEL
Anion Gap: 7 mEq/L (ref 3–11)
BUN: 5.7 mg/dL — AB (ref 7.0–26.0)
CHLORIDE: 101 meq/L (ref 98–109)
CO2: 27 mEq/L (ref 22–29)
Calcium: 8.9 mg/dL (ref 8.4–10.4)
Creatinine: 0.9 mg/dL (ref 0.6–1.1)
EGFR: 76 mL/min/{1.73_m2} — AB (ref >90)
Glucose: 133 mg/dl (ref 70–140)
POTASSIUM: 4.6 meq/L (ref 3.5–5.1)
Sodium: 136 mEq/L (ref 136–145)

## 2015-10-12 MED ORDER — LORAZEPAM 0.5 MG PO TABS: ORAL_TABLET | ORAL | Status: DC

## 2015-10-12 MED ORDER — PANTOPRAZOLE SODIUM 40 MG PO TBEC: 40.0000 mg | DELAYED_RELEASE_TABLET | Freq: Every day | ORAL | Status: DC

## 2015-10-12 NOTE — Progress Notes (Signed)
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 She had cycle 1 carboplatin taxol on 09-21-15; she was neutropenic with ANC 0.4 on day 8. She received granix x 5, from days 8-12. She was hospitalized days 7-9 (12-12 thru 12-14-) for tachycardia due to noncompliance with beta blocker. She is for cycle 2 carbo taxol on 10-13-15; we have discussed neulasta instead of granix, this to be given at [facility] on 10-15-15. Last paracentesis was for 1.6 liters on 09-28-15  Patient reports that she has "not felt too bad". Abdomen is not as distended as initially, tho still full. She has intermittent discomfort upper mid abdomen, possibly reflux, possibly after eats, possibly nausea; SL ativan has helped this sensation, last used x1 yesterday. She vomited x1 yesterday after eating yogurt + Ensure; later in day she was able to eat chicken soup and one protein shake. Taste is a little off. Bowels are moving small amouts daily but may not be adequate. Breathing is fine. She has no pain at time of this visit. No fever or symptoms of infection. No LE swelling. Voiding ok. Did sleep last pm. Is out of bed all day, tho is on couch a good bit. Minimal intermittent tingling tips of fingers only. Remainder of 10 point Review of Systems negative/ unchanged.  Flu vaccine 08-09-15 No central line No genetics testing   ONCOLOGIC HISTORY Patient has had regular medical care by PCP Dr Alain Marion and gyn Dr Garwin Brothers. Sometime after a fall in spring 2016, she noticed low pelvic discomfort and some intermittent vaginal spotting. She was seen for regular visit by Dr Garwin Brothers in May 2016, with PAP positive for AGUS and US showing endometrial stripe 8.36m.  Endometrial biopsy 03-12-15 showed high grade adenocarcinoma with papillary features, however immunostains were not typical for serous carcinoma. She was seen by Dr RDenman Georgein June. CT CAP 04-14-15 showed no evidence of metastatic disease in chest, a 3.3 x 2.8 cm endometrial mas and 3.1 cm cystic lesion of right ovary. She had robotic total laparoscopic hysterectomy BSO with bilateral pelvic and [facility] aortic node [facility]. Pathology [phone] grade 2 endometrioid adenocarcinoma invading 0.35 cm where myometrium 1.1 cm, 26 nodes negative and no LVSI. She had intracavitary radiation due to risk for vaginal cuff recurrence from 06-01-15 thru 07-01-15, vaginal cuff 27.5 gray in 5 fractions. There were no obvious concerns when she was seen by Dr RDenman Georgein late July or Dr KSondra Comeand Dr PAlain Marionin October. She presented to ED on 09-06-15 with generalized abdominal pain, bloating and constipation x 1 month. CT AP 09-06-15 showed extensive nodularity and stranding thruout omentum and large volume ascites, with liver/spleen/pancreas/adrenals not remarkable, no bowel obstruction. She was admitted x 24 hours, with UKoreaparacentesi on 09-07-15 for 5 liters of serous fluid, cytology (([id number] adenocarcinoma. SHe was much more comfortable after paracentesis and cannot tell that the fluid has reaccumulated as yet. She had consultation with Dr RDenman Georgeon 09-13-15, recommendation for systemic chemotherapy. Review of cytology from ascites compared with surgical specimen and [facility] high grade features that were seen on the initial endometrial biopsy (not seen on the full surgical path). She had repeat UKoreaparacentesis for 5.2  liters on 09-20-15. She had first carboplatin taxol on 09-21-15 and was neutropenic by day 8 cycle 1, granix begun.   Objective:  Vital signs in last 24 hours:  BP 136/65 mmHg  Pulse 99  Temp(Src) 98.3 F  (36.8 C) (Oral)  Resp 18  Ht '5\' 6"'$  (1.676 m)  Wt 173 lb 1.6 oz (78.518 kg)  BMI 27.95 kg/m2  SpO2 100% Weight down 1 lb. Not in acute discomfort. A little vague historian, as is husband. Respirations not labored RA. Very pleasant as always. Alert, oriented and appropriate. Ambulatory slowly without assistance, able to get on and off exam table with assistance of 1.   HEENT:PERRL, sclerae not icteric. Oral mucosa moist without lesions, posterior pharynx clear.  Neck supple. No JVD.  Lymphatics:no cervical,supraclavicular or inguinal adenopathy Resp: clear to auscultation bilaterally and normal percussion bilaterally Cardio: regular rate and rhythm. No gallop. GI: soft, moderately distended moreso in upper quadrants, not tight, no clear organomegaly. Few bowel sounds. Percussion consistent with gas in upper abdomen. No clear fluid wave. Minimally tender midline below epigastrium Musculoskeletal/ Extremities: without pitting edema, cords, tenderness Neuro: no significant peripheral neuropathy. Otherwise nonfocal. PSYCH appropriate mood and affect. Skin without rash, ecchymosis, petechiae   Lab Results:  Results for orders placed or performed in visit on 10/12/15  CBC with Differential  Result Value Ref Range   WBC 4.6 3.9 - 10.3 10e3/uL   NEUT# 2.6 1.5 - 6.5 10e3/uL   HGB 9.5 (L) 11.6 - 15.9 g/dL   HCT 29.0 (L) 34.8 - 46.6 %   Platelets 437 (H) 145 - 400 10e3/uL   MCV 90.4 79.5 - 101.0 fL   MCH 29.7 25.1 - 34.0 pg   MCHC 32.9 31.5 - 36.0 g/dL   RBC 3.20 (L) 3.70 - 5.45 10e6/uL   RDW 13.5 11.2 - 14.5 %   lymph# 1.3 0.9 - 3.3 10e3/uL   MONO# 0.6 0.1 - 0.9 10e3/uL   Eosinophils Absolute 0.0 0.0 - 0.5 10e3/uL   Basophils Absolute 0.0 0.0 - 0.1 10e3/uL   NEUT% 57.1 38.4 - 76.8 %   LYMPH% 28.1 14.0 - 49.7 %   MONO% 13.8 0.0 - 14.0 %   EOS% 0.2 0.0 - 7.0 %   BASO% 0.8 0.0 - 2.0 %  Basic metabolic panel  Result Value Ref Range   Sodium 136 136 - 145 mEq/L   Potassium 4.6 3.5 -  5.1 mEq/L   Chloride 101 98 - 109 mEq/L   CO2 27 22 - 29 mEq/L   Glucose 133 70 - 140 mg/dl   BUN 5.7 (L) 7.0 - 26.0 mg/dL   Creatinine 0.9 0.6 - 1.1 mg/dL   Calcium 8.9 8.4 - 10.4 mg/dL   Anion Gap 7 3 - 11 mEq/L   EGFR 76 (L) >90 ml/min/1.73 m2   CA 125 sent and pending  Studies/Results: Patient sent from office for abdominal Xray Dg Abd 1 View  10/12/2015  CLINICAL DATA:  Bloating. EXAM: ABDOMEN - 1 VIEW COMPARISON:  None. FINDINGS: Soft tissue structures are unremarkable. No bowel distention. Moderate amount of stool noted throughout the colon . No free air. Surgical clips right upper quadrant. Calcified densities noted over the abdomen and pelvis consistent with injection granulomas and phleboliths. A ureteral stone cannot be entirely excluded. IMPRESSION: No acute abnormality. Moderate amount of stool noted throughout colon . Electronically Signed   By: Marcello Moores  Register   On: 10/12/2015 10:05    Medications: I have reviewed the patient's current  medications. Refill ativan, which was last given #20 on 09-17-15 and has worked well for nausea from chemo; reminded them that the ativan makes it difficult for her to remember around each dose. Add protonix With abd xray information, will have her begin miralax daily (no bowel meds listed) Reviewed neulasta, including possible aches, which can be severe in some patients; preferable in her to have injection at [facility] to be sure it happens correctly. Reminded her to continue beta blocker. Will try to get her to use claritin daily x 1 week begin day of chemo or day after, for taxol/neulasta aches. Message to RN to talk with her about this.   DISCUSSION Meds as above, including neulasta. Needs to increase Ensure/ Boost + protein shakes to total of 4 daily since not eating much otherwise. Will ask [facility] dietician to follow up (I believe she was seen by nutritionist in hospital, but needs reinforcement). Abd xray available after visit, RN to follow  up with instructions. Does not seem to need paracentesis again immediately, will try to address constipation for present.  Cycle 2 chemo as planned 10-13-15, neulasta 10-15-15. I will see her on 10-19-15;  needs counts checked the following week, and I will see her on 11-01-15 prior to cycle 3 on 11-01-14. May adjust carbo up slightly for cycle 3 if maintained on neulasta/ if she tolerates neulasta.   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 Massive ascites, with 5 liters removed 09-07-15 , 5.2 liters on 09-20-15, and 1.6 liters on 09-28-15. Chemotherapy begun with carbo taxol 09-21-15, . Note previous treatment including radiation for anal cancer may be impacting counts; will decrease taxol slightly for cycle 2  then may try to increase again with cycle 3 if counts maintain using neulasta. 2. Chemo neutropenia by day 8 cycle 1 such that she will need gCSF in preventative fashion with each subsequent cycle. Neulasta approved per my communication with [facility] managed care now. Needs to be [facility] at [facility] as some noncompliance and comprehension concerns. Possible self-limited aching from neulasta discussed, however single injection will be easier for them than several days of granix if able to tolerate. [place] fine now for cycle 2 on 10-13-15. 3.Admission for tachycardia after cycle 1, HR sustained 150-160, no other findings and resolved when beta blocker was resumed, this DCd in error by patient. 4.chemo anemia: hgb slightly lower, not to point of requiring transfusion now. Platelets fine. 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 She did not have adjuvant chemotherapy with those surgical findings.  Initial biopsy specimen only had a high grade component [facility] path), which is identical to this recurrent disease. 6.constipation by abdominal xray after visit today, no obstruction. Will begin daily bowel regimen and follow. Likely causing some of present abdominal distension, likely interfering some with appetite, may be causing some of the abdominal discomfort 7.renal insufficiency: CKD 3. Ascites clinically improving which should help ability to hydrate. Now off HCTZ and now off spironolactone. No hydronephrosis. Did tolerate CTA chest contrast recent hospital admission, but would be cautious about IV contrast. Dosing carbo with AUC. 7.Hyperkalemia corrected off of K supplement and spironolactone 8.History of "hepatitis" reportedly on steroids x 20 years which were being tapered when I met her initially, present  dose 2.5 mg QOD per PCP 9.history of HTN and elevated lipids: now off HCTZ. I do not believe that she has cardiologist 10.post cholecystectomy and up to date colonoscopy and mammograms 11. Poor nutritional status: needs to increase supplements, discussed. Met hospital nutritionist on 09-28-15 and needs follow up with [facility] dietician. 12.squamous cell carcinoma anus 2003: radiation and may have had chemo then, may be impacting counts with present chemo. Records will be requested from archives.   All questions answered, and patient / husband know to call if concerns prior to scheduled visit. Dietician requested. Abd Xray report followed up. Time spent 35 min including >50% counseling and coordination of care.  Cc Dr Alain Marion.   LIVESAY,LENNIS P, MD   10/13/2015, 7:48 AM

## 2015-10-12 NOTE — Telephone Encounter (Signed)
Sharon Holt picked up Protonix prescription. She has Decadron tablets for tomorrow's treatment.  She correctly verbalized the times to take tablets and to take with food. Reviewed aches from Neulasta and  taxol.  Discussed heating pad and using Claritin 10 mg daily for ~7-10 days after the treatment to help with the aches.  Sharon Holt verbalized understanding.

## 2015-10-12 NOTE — Telephone Encounter (Signed)
Appointments made and avs printed for patient °

## 2015-10-12 NOTE — Telephone Encounter (Signed)
-----   Message from Gordy Levan, MD sent at 10/12/2015  9:29 AM EST ----- Protonix 40 mg daily for acid reflux #30  2 RF  Be sure she has enough decadron for #2 chemo on 12-28  Need to call her re abd xray results today. Please go over times for decadron premed then, remind her with food. Please go over possible taxol and neulasta aches   thanks

## 2015-10-12 NOTE — Telephone Encounter (Signed)
Appointments made and patient called with NUT appointment

## 2015-10-13 ENCOUNTER — Other Ambulatory Visit: Payer: Self-pay | Admitting: Oncology

## 2015-10-13 ENCOUNTER — Telehealth: Payer: Self-pay | Admitting: Oncology

## 2015-10-13 ENCOUNTER — Telehealth: Payer: Self-pay

## 2015-10-13 ENCOUNTER — Ambulatory Visit (HOSPITAL_BASED_OUTPATIENT_CLINIC_OR_DEPARTMENT_OTHER): Payer: Commercial Managed Care - HMO

## 2015-10-13 VITALS — BP 158/86 | HR 100 | Temp 98.6°F | Resp 17

## 2015-10-13 DIAGNOSIS — C801 Malignant (primary) neoplasm, unspecified: Secondary | ICD-10-CM | POA: Diagnosis not present

## 2015-10-13 DIAGNOSIS — C786 Secondary malignant neoplasm of retroperitoneum and peritoneum: Secondary | ICD-10-CM | POA: Diagnosis not present

## 2015-10-13 DIAGNOSIS — C541 Malignant neoplasm of endometrium: Secondary | ICD-10-CM

## 2015-10-13 DIAGNOSIS — Z5111 Encounter for antineoplastic chemotherapy: Secondary | ICD-10-CM | POA: Diagnosis not present

## 2015-10-13 LAB — CA 125: CA 125: 1375 U/mL — AB (ref <35)

## 2015-10-13 MED ORDER — DIPHENHYDRAMINE HCL 50 MG/ML IJ SOLN
INTRAMUSCULAR | Status: AC
  Filled 2015-10-13: qty 1

## 2015-10-13 MED ORDER — DIPHENHYDRAMINE HCL 50 MG/ML IJ SOLN
50.0000 mg | Freq: Once | INTRAMUSCULAR | Status: AC
  Administered 2015-10-13: 50 mg via INTRAVENOUS

## 2015-10-13 MED ORDER — SODIUM CHLORIDE 0.9 % IV SOLN
316.0500 mg | Freq: Once | INTRAVENOUS | Status: AC
  Administered 2015-10-13: 320 mg via INTRAVENOUS
  Filled 2015-10-13: qty 32

## 2015-10-13 MED ORDER — FAMOTIDINE IN NACL 20-0.9 MG/50ML-% IV SOLN
20.0000 mg | Freq: Once | INTRAVENOUS | Status: AC
  Administered 2015-10-13: 20 mg via INTRAVENOUS

## 2015-10-13 MED ORDER — SODIUM CHLORIDE 0.9 % IV SOLN
Freq: Once | INTRAVENOUS | Status: AC
  Administered 2015-10-13: 13:00:00 via INTRAVENOUS

## 2015-10-13 MED ORDER — FAMOTIDINE IN NACL 20-0.9 MG/50ML-% IV SOLN
INTRAVENOUS | Status: AC
  Filled 2015-10-13: qty 50

## 2015-10-13 MED ORDER — SODIUM CHLORIDE 0.9 % IV SOLN
Freq: Once | INTRAVENOUS | Status: AC
  Administered 2015-10-13: 14:00:00 via INTRAVENOUS
  Filled 2015-10-13: qty 8

## 2015-10-13 MED ORDER — PACLITAXEL CHEMO INJECTION 300 MG/50ML
175.0000 mg/m2 | Freq: Once | INTRAVENOUS | Status: AC
  Administered 2015-10-13: 342 mg via INTRAVENOUS
  Filled 2015-10-13: qty 57

## 2015-10-13 NOTE — Telephone Encounter (Signed)
1/10 lab added per pof and patient will get a new avs today

## 2015-10-13 NOTE — Patient Instructions (Signed)
[facility] Discharge Instructions for Patients Receiving Chemotherapy  Today you received the following chemotherapy agents:Taxol and Carboplatin   To help prevent nausea and vomiting after your treatment, we encourage you to take your nausea medication as directed.    If you develop nausea and vomiting that is not controlled by your nausea medication, call the clinic.   BELOW ARE SYMPTOMS THAT SHOULD BE REPORTED IMMEDIATELY:  *FEVER GREATER THAN 100.5 F  *CHILLS WITH OR WITHOUT FEVER  NAUSEA AND VOMITING THAT IS NOT CONTROLLED WITH YOUR NAUSEA MEDICATION  *UNUSUAL SHORTNESS OF BREATH  *UNUSUAL BRUISING OR BLEEDING  TENDERNESS IN MOUTH AND THROAT WITH OR WITHOUT PRESENCE OF ULCERS  *URINARY PROBLEMS  *BOWEL PROBLEMS  UNUSUAL RASH Items with * indicate a potential emergency and should be followed up as soon as possible.  Feel free to call the clinic you have any questions or concerns. The clinic phone number is [phone].  Please show the [facility] at check-in to the Emergency Department and triage nurse.   Paclitaxel injection What is this medicine? PACLITAXEL (PAK li TAX el) is a chemotherapy drug. It targets fast dividing cells, like cancer cells, and causes these cells to die. This medicine is used to treat ovarian cancer, breast cancer, and other cancers. This medicine may be used for other purposes; ask your health care provider or pharmacist if you have questions. What should I tell my health care provider before I take this medicine? They need to know if you have any of these conditions: -blood disorders -irregular heartbeat -infection (especially a virus infection such as chickenpox, cold sores, or herpes) -liver disease -previous or ongoing radiation therapy -an unusual or allergic reaction to paclitaxel, alcohol, polyoxyethylated castor oil, other chemotherapy agents, other medicines, foods, dyes, or preservatives -pregnant or  trying to get pregnant -breast-feeding How should I use this medicine? This drug is given as an infusion into a vein. It is administered in a hospital or clinic by a specially trained health care professional. Talk to your pediatrician regarding the use of this medicine in children. Special care may be needed. Overdosage: If you think you have taken too much of this medicine contact a poison control center or emergency room at once. NOTE: This medicine is only for you. Do not share this medicine with others. What if I miss a dose? It is important not to miss your dose. Call your doctor or health care professional if you are unable to keep an appointment. What may interact with this medicine? Do not take this medicine with any of the following medications: -disulfiram -metronidazole This medicine may also interact with the following medications: -cyclosporine -diazepam -ketoconazole -medicines to increase blood counts like filgrastim, pegfilgrastim, sargramostim -other chemotherapy drugs like cisplatin, doxorubicin, epirubicin, etoposide, teniposide, vincristine -quinidine -testosterone -vaccines -verapamil Talk to your doctor or health care professional before taking any of these medicines: -acetaminophen -aspirin -ibuprofen -ketoprofen -naproxen This list may not describe all possible interactions. Give your health care provider a list of all the medicines, herbs, non-prescription drugs, or dietary supplements you use. Also tell them if you smoke, drink alcohol, or use illegal drugs. Some items may interact with your medicine. What should I watch for while using this medicine? Your condition will be monitored carefully while you are receiving this medicine. You will need important blood work done while you are taking this medicine. This drug may make you feel generally unwell. This is not uncommon, as chemotherapy can affect healthy cells  as well as cancer cells. Report any side  effects. Continue your course of treatment even though you feel ill unless your doctor tells you to stop. This medicine can cause serious allergic reactions. To reduce your risk you will need to take other medicine(s) before treatment with this medicine. In some cases, you may be given additional medicines to help with side effects. Follow all directions for their use. Call your doctor or health care professional for advice if you get a fever, chills or sore throat, or other symptoms of a cold or flu. Do not treat yourself. This drug decreases your body's ability to fight infections. Try to avoid being around people who are sick. This medicine may increase your risk to bruise or bleed. Call your doctor or health care professional if you notice any unusual bleeding. Be careful brushing and flossing your teeth or using a toothpick because you may get an infection or bleed more easily. If you have any dental work done, tell your dentist you are receiving this medicine. Avoid taking products that contain aspirin, acetaminophen, ibuprofen, naproxen, or ketoprofen unless instructed by your doctor. These medicines may hide a fever. Do not become pregnant while taking this medicine. Women should inform their doctor if they wish to become pregnant or think they might be pregnant. There is a potential for serious side effects to an unborn child. Talk to your health care professional or pharmacist for more information. Do not breast-feed an infant while taking this medicine. Men are advised not to father a child while receiving this medicine. This product may contain alcohol. Ask your pharmacist or healthcare provider if this medicine contains alcohol. Be sure to tell all healthcare providers you are taking this medicine. Certain medicines, like metronidazole and disulfiram, can cause an unpleasant reaction when taken with alcohol. The reaction includes flushing, headache, nausea, vomiting, sweating, and increased  thirst. The reaction can last from 30 minutes to several hours. What side effects may I notice from receiving this medicine? Side effects that you should report to your doctor or health care professional as soon as possible: -allergic reactions like skin rash, itching or hives, swelling of the face, lips, or tongue -low blood counts - This drug may decrease the number of white blood cells, red blood cells and platelets. You may be at increased risk for infections and bleeding. -signs of infection - fever or chills, cough, sore throat, pain or difficulty passing urine -signs of decreased platelets or bleeding - bruising, pinpoint red spots on the skin, black, tarry stools, nosebleeds -signs of decreased red blood cells - unusually weak or tired, fainting spells, lightheadedness -breathing problems -chest pain -high or low blood pressure -mouth sores -nausea and vomiting -pain, swelling, redness or irritation at the injection site -pain, tingling, numbness in the hands or feet -slow or irregular heartbeat -swelling of the ankle, feet, hands Side effects that usually do not require medical attention (report to your doctor or health care professional if they continue or are bothersome): -bone pain -complete hair loss including hair on your head, underarms, pubic hair, eyebrows, and eyelashes -changes in the color of fingernails -diarrhea -loosening of the fingernails -loss of appetite -muscle or joint pain -red flush to skin -sweating This list may not describe all possible side effects. Call your doctor for medical advice about side effects. You may report side effects to FDA at 1-800-FDA-1088. Where should I keep my medicine? This drug is given in a hospital or clinic and will not be  stored at home. NOTE: This sheet is a summary. It may not cover all possible information. If you have questions about this medicine, talk to your doctor, pharmacist, or health care provider.    2016,  [employer] Standard. (2015-05-20 13:02:56)   Carboplatin injection What is this medicine? CARBOPLATIN (KAR boe pla tin) is a chemotherapy drug. It targets fast dividing cells, like cancer cells, and causes these cells to die. This medicine is used to treat ovarian cancer and many other cancers. This medicine may be used for other purposes; ask your health care provider or pharmacist if you have questions. What should I tell my health care provider before I take this medicine? They need to know if you have any of these conditions: -blood disorders -hearing problems -kidney disease -recent or ongoing radiation therapy -an unusual or allergic reaction to carboplatin, cisplatin, other chemotherapy, other medicines, foods, dyes, or preservatives -pregnant or trying to get pregnant -breast-feeding How should I use this medicine? This drug is usually given as an infusion into a vein. It is administered in a hospital or clinic by a specially trained health care professional. Talk to your pediatrician regarding the use of this medicine in children. Special care may be needed. Overdosage: If you think you have taken too much of this medicine contact a poison control center or emergency room at once. NOTE: This medicine is only for you. Do not share this medicine with others. What if I miss a dose? It is important not to miss a dose. Call your doctor or health care professional if you are unable to keep an appointment. What may interact with this medicine? -medicines for seizures -medicines to increase blood counts like filgrastim, pegfilgrastim, sargramostim -some antibiotics like amikacin, gentamicin, neomycin, streptomycin, tobramycin -vaccines Talk to your doctor or health care professional before taking any of these medicines: -acetaminophen -aspirin -ibuprofen -ketoprofen -naproxen This list may not describe all possible interactions. Give your health care provider a list of all the  medicines, herbs, non-prescription drugs, or dietary supplements you use. Also tell them if you smoke, drink alcohol, or use illegal drugs. Some items may interact with your medicine. What should I watch for while using this medicine? Your condition will be monitored carefully while you are receiving this medicine. You will need important blood work done while you are taking this medicine. This drug may make you feel generally unwell. This is not uncommon, as chemotherapy can affect healthy cells as well as cancer cells. Report any side effects. Continue your course of treatment even though you feel ill unless your doctor tells you to stop. In some cases, you may be given additional medicines to help with side effects. Follow all directions for their use. Call your doctor or health care professional for advice if you get a fever, chills or sore throat, or other symptoms of a cold or flu. Do not treat yourself. This drug decreases your body's ability to fight infections. Try to avoid being around people who are sick. This medicine may increase your risk to bruise or bleed. Call your doctor or health care professional if you notice any unusual bleeding. Be careful brushing and flossing your teeth or using a toothpick because you may get an infection or bleed more easily. If you have any dental work done, tell your dentist you are receiving this medicine. Avoid taking products that contain aspirin, acetaminophen, ibuprofen, naproxen, or ketoprofen unless instructed by your doctor. These medicines may hide a fever. Do not become pregnant while  taking this medicine. Women should inform their doctor if they wish to become pregnant or think they might be pregnant. There is a potential for serious side effects to an unborn child. Talk to your health care professional or pharmacist for more information. Do not breast-feed an infant while taking this medicine. What side effects may I notice from receiving this  medicine? Side effects that you should report to your doctor or health care professional as soon as possible: -allergic reactions like skin rash, itching or hives, swelling of the face, lips, or tongue -signs of infection - fever or chills, cough, sore throat, pain or difficulty passing urine -signs of decreased platelets or bleeding - bruising, pinpoint red spots on the skin, black, tarry stools, nosebleeds -signs of decreased red blood cells - unusually weak or tired, fainting spells, lightheadedness -breathing problems -changes in hearing -changes in vision -chest pain -high blood pressure -low blood counts - This drug may decrease the number of white blood cells, red blood cells and platelets. You may be at increased risk for infections and bleeding. -nausea and vomiting -pain, swelling, redness or irritation at the injection site -pain, tingling, numbness in the hands or feet -problems with balance, talking, walking -trouble passing urine or change in the amount of urine Side effects that usually do not require medical attention (report to your doctor or health care professional if they continue or are bothersome): -hair loss -loss of appetite -metallic taste in the mouth or changes in taste This list may not describe all possible side effects. Call your doctor for medical advice about side effects. You may report side effects to FDA at 1-800-FDA-1088. Where should I keep my medicine? This drug is given in a hospital or clinic and will not be stored at home. NOTE: This sheet is a summary. It may not cover all possible information. If you have questions about this medicine, talk to your doctor, pharmacist, or health care provider.    2016, [employer]/Gold Standard. (2008-01-07 14:38:05)   DO NOT TAKE LORAZEPAM AND CLORAZEPATE AT THE SAME TIME.

## 2015-10-13 NOTE — Telephone Encounter (Signed)
-----   Message from Gordy Levan, MD sent at 10/13/2015  7:35 AM EST ----- Labs seen and need follow up: please let her know this xray shows constipation. No laxatives listed on meds.  She needs to start miralax (glycolax) 1 capful today and every day. The constipation is making her appetite less and causing some of the abdominal swelling.   Please have patient/ husband write it down - if granddaughter there, should tell her also. thanks

## 2015-10-13 NOTE — Telephone Encounter (Signed)
Spoke with patient and husband in the infusion room regarding information noted below by Dr. Marko Plume. Gave patient written information.  Also wrote doen instructions for Claritin 10 mg daily for 7 days beginning the day of her treatment for the Taxol aches. [name] repeated instructions back to this nurse correctly.

## 2015-10-15 ENCOUNTER — Ambulatory Visit: Payer: Commercial Managed Care - HMO | Admitting: Nutrition

## 2015-10-15 ENCOUNTER — Ambulatory Visit (HOSPITAL_BASED_OUTPATIENT_CLINIC_OR_DEPARTMENT_OTHER): Payer: Commercial Managed Care - HMO

## 2015-10-15 VITALS — BP 141/72 | HR 107 | Temp 98.4°F

## 2015-10-15 DIAGNOSIS — C801 Malignant (primary) neoplasm, unspecified: Secondary | ICD-10-CM

## 2015-10-15 DIAGNOSIS — C541 Malignant neoplasm of endometrium: Secondary | ICD-10-CM

## 2015-10-15 DIAGNOSIS — C786 Secondary malignant neoplasm of retroperitoneum and peritoneum: Secondary | ICD-10-CM | POA: Diagnosis not present

## 2015-10-15 MED ORDER — PEGFILGRASTIM INJECTION 6 MG/0.6ML
6.0000 mg | Freq: Once | SUBCUTANEOUS | Status: AC
  Administered 2015-10-15: 6 mg via SUBCUTANEOUS
  Filled 2015-10-15: qty 0.6

## 2015-10-15 NOTE — Progress Notes (Signed)
74 year old female diagnosed with endometrial cancer.  She is a patient of Dr. Marko Plume.  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  Medications include vitamin D, Decadron, Ativan, Zofran, Protonix, and MiraLAX.  Labs were reviewed on December 27.  Height: 66 inches. Weight: 173.1 pounds. Usual body weight: 184 pounds. BMI: 27.95.  Patient is status post paracentesis of 1.6 L on December 13. She reports history of constipation which is now improved using MiraLAX. Patient has been drinking one ensure or a protein shake daily. She is eating very small amounts at mealtimes but states appetite has improved. Questions were voiced regarding food safety. Patient denies nausea, vomiting and diarrhea.  Nutrition diagnosis: Unintended weight loss related to endometrial cancer as evidenced by 11 pound weight loss from usual body weight.  Intervention:  Patient was educated to increase calories and protein in small frequent meals and snacks. Educated patient on foods high in protein and encouraged intake 6 times daily. Recommended patient increase oral nutrition supplements twice a day. Educated patient briefly on strategies for improving constipation.  I provided fact sheets. Provided samples of Ensure Plus and boost plus along with coupons, and fact sheets for patient. Educated patient on food safety and provided fact sheet. Questions were answered.  Teach back method was used.  Contact information was provided.  Monitoring, evaluation, goals: Patient will tolerate adequate calories and protein for minimal weight loss.  Next visit: Tuesday, January 17, during infusion.  **Disclaimer: This note was dictated with voice recognition software. Similar sounding words can inadvertently be transcribed and this note may contain transcription errors which may not have been corrected upon publication of  note.**

## 2015-10-17 ENCOUNTER — Other Ambulatory Visit: Payer: Self-pay | Admitting: Oncology

## 2015-10-17 DIAGNOSIS — C541 Malignant neoplasm of endometrium: Secondary | ICD-10-CM

## 2015-10-17 DIAGNOSIS — C801 Malignant (primary) neoplasm, unspecified: Principal | ICD-10-CM

## 2015-10-17 DIAGNOSIS — C786 Secondary malignant neoplasm of retroperitoneum and peritoneum: Secondary | ICD-10-CM

## 2015-10-19 ENCOUNTER — Other Ambulatory Visit (HOSPITAL_BASED_OUTPATIENT_CLINIC_OR_DEPARTMENT_OTHER): Payer: Commercial Managed Care - HMO

## 2015-10-19 ENCOUNTER — Ambulatory Visit (HOSPITAL_BASED_OUTPATIENT_CLINIC_OR_DEPARTMENT_OTHER): Payer: Commercial Managed Care - HMO | Admitting: Oncology

## 2015-10-19 ENCOUNTER — Encounter: Payer: Self-pay | Admitting: Oncology

## 2015-10-19 VITALS — BP 171/94 | HR 135 | Temp 98.4°F | Resp 18 | Ht 66.0 in | Wt 172.5 lb

## 2015-10-19 DIAGNOSIS — E46 Unspecified protein-calorie malnutrition: Secondary | ICD-10-CM

## 2015-10-19 DIAGNOSIS — C541 Malignant neoplasm of endometrium: Secondary | ICD-10-CM

## 2015-10-19 DIAGNOSIS — T451X5A Adverse effect of antineoplastic and immunosuppressive drugs, initial encounter: Secondary | ICD-10-CM

## 2015-10-19 DIAGNOSIS — R000 Tachycardia, unspecified: Secondary | ICD-10-CM

## 2015-10-19 DIAGNOSIS — D6481 Anemia due to antineoplastic chemotherapy: Secondary | ICD-10-CM

## 2015-10-19 DIAGNOSIS — D701 Agranulocytosis secondary to cancer chemotherapy: Secondary | ICD-10-CM

## 2015-10-19 DIAGNOSIS — C786 Secondary malignant neoplasm of retroperitoneum and peritoneum: Secondary | ICD-10-CM

## 2015-10-19 DIAGNOSIS — R180 Malignant ascites: Secondary | ICD-10-CM

## 2015-10-19 DIAGNOSIS — C801 Malignant (primary) neoplasm, unspecified: Secondary | ICD-10-CM

## 2015-10-19 DIAGNOSIS — K5900 Constipation, unspecified: Secondary | ICD-10-CM

## 2015-10-19 LAB — CBC WITH DIFFERENTIAL/PLATELET
BASO%: 1.1 % (ref 0.0–2.0)
BASOS ABS: 0 10*3/uL (ref 0.0–0.1)
EOS ABS: 0 10*3/uL (ref 0.0–0.5)
EOS%: 1.6 % (ref 0.0–7.0)
HEMATOCRIT: 27.2 % — AB (ref 34.8–46.6)
HGB: 9 g/dL — ABNORMAL LOW (ref 11.6–15.9)
LYMPH#: 0.7 10*3/uL — AB (ref 0.9–3.3)
LYMPH%: 43.4 % (ref 14.0–49.7)
MCH: 29.8 pg (ref 25.1–34.0)
MCHC: 32.9 g/dL (ref 31.5–36.0)
MCV: 90.5 fL (ref 79.5–101.0)
MONO#: 0.3 10*3/uL (ref 0.1–0.9)
MONO%: 15.7 % — ABNORMAL HIGH (ref 0.0–14.0)
NEUT#: 0.6 10*3/uL — ABNORMAL LOW (ref 1.5–6.5)
NEUT%: 38.2 % — AB (ref 38.4–76.8)
PLATELETS: 201 10*3/uL (ref 145–400)
RBC: 3 10*6/uL — AB (ref 3.70–5.45)
RDW: 14 % (ref 11.2–14.5)
WBC: 1.6 10*3/uL — ABNORMAL LOW (ref 3.9–10.3)

## 2015-10-19 LAB — BASIC METABOLIC PANEL
ANION GAP: 9 meq/L (ref 3–11)
BUN: 8.6 mg/dL (ref 7.0–26.0)
CO2: 27 mEq/L (ref 22–29)
CREATININE: 0.8 mg/dL (ref 0.6–1.1)
Calcium: 9.3 mg/dL (ref 8.4–10.4)
Chloride: 98 mEq/L (ref 98–109)
EGFR: 79 mL/min/{1.73_m2} — ABNORMAL LOW (ref >90)
GLUCOSE: 198 mg/dL — AB (ref 70–140)
POTASSIUM: 4.1 meq/L (ref 3.5–5.1)
Sodium: 133 mEq/L — ABNORMAL LOW (ref 136–145)

## 2015-10-19 NOTE — Progress Notes (Signed)
OFFICE PROGRESS NOTE   October 19, 2015   Physicians:Emma Rossi,Plotnikov, [name], MD, Gery Pray, [place] [place] [facility] [name]), [name]  INTERVAL HISTORY:   Sharon Holt is seen,, together with husband, in continuing attention to recurrent high grade endometrial carcinoma, with carcinomatosis. She had cycle 2 carbo (AUC 3.5) and taxol on 10-13-15 with neulasta on 10-15-15, but is neutropenic today. She is also tachycardic at 135 without other clear cardiac symptoms, recently hospitalized for sustained tachycardia 150-160 when she was noncompliant with beta blocker.   Sharon Holt denies fever or symptoms of infection. Fatigue is manageable, not much nausea, is pushing fluids. She has had only twinges of pain in legs since taxol and neulasta, Sharon severe body aches and Sharon chest pain. She has taken Claritin starting day of chemo, as directed for aches, planned x 1 week. She was constipated last week, including by abdominal xray on 12-27, with daily miralax recommended on 12-28 tho she has not used this at least in last few days. Bowels still do not seem to be moving optimally. Last paracentesis was 09-28-15 for 1.6 liters; malignant ascites is not uncomfortable or obviously reaccumulating now. She is attempting to drink 2 supplements daily. She has only intermittent minimal tingling in fingertips and toes.She is voiding frequently, including up 3-4x to void at night, is trying to drink water up to bedtime and during night, which I have told her can be cut back. She has some generalized abdominal discomfort but Sharon severe localized pain. Sharon bleeding. Sharon SOB walking into office today. Sharon LE swelling. Peripheral IV access ok with recent chemo. Remainder of 10 point Review of Systems negative/ unchanged   Flu vaccine 08-09-15 Sharon central line Sharon genetics testing   ONCOLOGIC HISTORY Sharon Holt has had regular medical care by PCP Dr Alain Marion and gyn Dr Garwin Brothers. Sometime after a fall in spring 2016, she  noticed low pelvic discomfort and some intermittent vaginal spotting. She was seen for regular visit by Dr Garwin Brothers in May 2016, with PAP positive for AGUS and US showing endometrial stripe 8.50m. Endometrial biopsy 03-12-15 showed high grade adenocarcinoma with papillary features, however immunostains were not typical for serous carcinoma. She was seen by Dr RDenman Georgein June. CT CAP 04-14-15 showed Sharon evidence of metastatic disease in chest, a 3.3 x 2.8 cm endometrial mas and 3.1 cm cystic lesion of right ovary. She had robotic total laparoscopic hysterectomy BSO with bilateral pelvic and [facility] Pathology ([phone] grade 2 endometrioid adenocarcinoma invading 0.35 cm where myometrium 1.1 cm, 26 nodes negative and Sharon LVSI. She had intracavitary radiation due to risk for vaginal cuff recurrence from 06-01-15 thru 07-01-15, vaginal cuff 27.5 gray in 5 fractions. There were Sharon obvious concerns when she was seen by Dr RDenman Georgein late July or Dr KSondra Comeand Dr PAlain Marionin October. She presented to ED on 09-06-15 with generalized abdominal pain, bloating and constipation x 1 month. CT AP 09-06-15 showed extensive nodularity and stranding thruout omentum and large volume ascites, with liver/spleen/pancreas/adrenals not remarkable, Sharon bowel obstruction. She was admitted x 24 hours, with UKoreaparacentesi on 09-07-15 for 5 liters of serous fluid, cytology (([id number] adenocarcinoma. SHe was much more comfortable after paracentesis and cannot tell that the fluid has reaccumulated as yet. She had consultation with Dr RDenman Georgeon 09-13-15, recommendation for systemic chemotherapy. Review of cytology from ascites compared with surgical specimen and [facility] high grade  features that were seen on the initial endometrial biopsy (not seen on the full surgical path). She had repeat US paracentesis for 5.2 liters on 09-20-15.  She had first carboplatin taxol on 09-21-15 and was neutropenic by day 8 cycle 1, granix begun. She had cycle 2 at same doses (carbo AUC = 3.5) 0n 10-13-15 with neulasta on 10-15-15 with neulasta on 12-30, and was neutropenic on day 7.  Objective:  Vital signs in last 24 hours:  BP 171/94 mmHg  Pulse 135  Temp(Src) 98.4 F (36.9 C) (Oral)  Resp 18  Ht _0  (1.676 m)  Wt 172 lb 8 oz (78.245 kg)  BMI 27.86 kg/m2  SpO2 100% Weight down 1 lb. Looks moderately fatigued and rather chronically ill,  but not in any distress, respirations not labored RA in exam room.  Alert, oriented and appropriate.Very pleasant as always. Ambulatory without assistance.  Alopecia  HEENT:PERRL, sclerae not icteric. Conjunctivae and mucous membranes somewhat pale. Oral mucosa moist without lesions, posterior pharynx clear.  Neck supple. Sharon JVD.  Lymphatics:Sharon supraclavicular adenopathy Resp: clear to auscultation bilaterally and normal percussion bilaterally Cardio: tachy, regular. Sharon gallop.clear heart sounds GI: soft, nontender, somewhat distended especiallly upper quadrants but not tight and Sharon marked fluid wave. Normally active bowel sounds. Surgical incision not remarkable Musculoskeletal/ Extremities: without pitting edema, cords, tenderness Neuro: Sharon significant peripheral neuropathy. Otherwise nonfocal.Psych appropriate mood and affect Skin without rash, ecchymosis, petechiae   Lab Results:  Results for orders placed or performed in visit on 10/19/15  CBC with Differential  Result Value Ref Range   WBC 1.6 (L) 3.9 - 10.3 10e3/uL   NEUT# 0.6 (L) 1.5 - 6.5 10e3/uL   HGB 9.0 (L) 11.6 - 15.9 g/dL   HCT 27.2 (L) 34.8 - 46.6 %   Platelets 201 145 - 400 10e3/uL   MCV 90.5 79.5 - 101.0 fL   MCH 29.8 25.1 - 34.0 pg   MCHC 32.9 31.5 - 36.0 g/dL   RBC 3.00 (L) 3.70 - 5.45 10e6/uL   RDW 14.0 11.2 - 14.5 %   lymph# 0.7 (L) 0.9 - 3.3 10e3/uL   MONO# 0.3 0.1 - 0.9 10e3/uL   Eosinophils Absolute 0.0 0.0  - 0.5 10e3/uL   Basophils Absolute 0.0 0.0 - 0.1 10e3/uL   NEUT% 38.2 (L) 38.4 - 76.8 %   LYMPH% 43.4 14.0 - 49.7 %   MONO% 15.7 (H) 0.0 - 14.0 %   EOS% 1.6 0.0 - 7.0 %   BASO% 1.1 0.0 - 2.0 %  Basic metabolic panel  Result Value Ref Range   Sodium 133 (L) 136 - 145 mEq/L   Potassium 4.1 3.5 - 5.1 mEq/L   Chloride 98 98 - 109 mEq/L   CO2 27 22 - 29 mEq/L   Glucose 198 (H) 70 - 140 mg/dl   BUN 8.6 7.0 - 26.0 mg/dL   Creatinine 0.8 0.6 - 1.1 mg/dL   Calcium 9.3 8.4 - 10.4 mg/dL   Anion Gap 9 3 - 11 mEq/L   EGFR 79 (L) >90 ml/min/1.73 m2     Studies/Results:  Sharon results found.  Medications: I have reviewed the Sharon Holt's current medications. Needs miralax daily or prn to keep bowels moving daily. See below re beta blocker   DISCUSSION Reviewed neutropenic precautions including calling immediately for temperature >=100.5. They are aware that neulasta does not necessarily prevent drop in [place] but should allow these to recover more quickly. Sharon Holt reports that she too beta blocker ~ 90 min  prior to this visit, using 1 1/2 of tablets from a previous prescription. She does have the newer prescription from hospital (apparently atenolol 25 mg for 3 tabs daily) which I have told her to use instead. Note some of the tachycardia may be related to mild anemia also.  We have discussed fact that malignant ascites does not seem to be reaccumulating as quickly and that last ca125 on 12-27 was down to 1375, this having been 2427 just prior to start of chemo. Marker will be repeated on 11-01-15  RN will f/u by phone later this week. She would need cbc and to be seen if fever/ infection symptoms or other problems from chemo cytopenias. She will have CBC and speak with RN on 10-26-15. She will see MD with lab 11-01-15 and will be due cycle 3 chemo on 1-17, with neulasta.    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 Massive ascites, with 5 liters removed 09-07-15 , 5.2 liters on 09-20-15, and 1.6 liters on 09-28-15. Chemotherapy begun with carbo taxol 09-21-15, . Note previous treatment including radiation for anal cancer may be impacting counts. Clinically some early response to chemo by ascites less and decrease in marker. Plan as above. 2. Chemo neutropenia by day 8 cycle 1, and neutropenic day 7 cycle 2 today, post neulaasta day 3 cycle 2 (and dose reduced carbo) Neutropenic precautions.   Note neulasta best given be given at [facility] as some noncompliance and comprehension concerns.  3.Admission for tachycardia after cycle 1, HR sustained 150-160, Sharon other findings and resolved when beta blocker was resumed, this DCd in error by Sharon Holt then. Tachycardic again today, not completely clear if meds correct. I will let Dr Alain Marion know also. Sharon Holt unstructed as above. 4.chemo anemia: hgb slightly lower, not to point of requiring transfusion now. Platelets fine. 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 Sharon adjuvant chemotherapy with those surgical findings. Initial biopsy specimen only had a high grade component [facility] path), which is identical to this recurrent disease. 6.constipation: discussed bowel regimen again. Abdominal xray 12-28 Sharon obstruction.  7.renal insufficiency: CKD 3. Ascites clinically improving and now looks well hydrated. Now off HCTZ and now off spironolactone. Sharon hydronephrosis. Did tolerate CTA chest contrast recent hospital admission, but would be cautious about IV contrast. Dosing carbo with AUC. 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 Managed by Dr Alain Marion 10.post cholecystectomy and up to date colonoscopy and mammograms 11. Poor nutritional status: needs to increase supplements, discussed. Met hospital nutritionist on 09-28-15 and for follow up with [facility] dietician. 12.squamous cell carcinoma anus 2003: radiation and may have had chemo then, may be impacting counts with present chemo. Records requested from archives.   All questions answered and they are instructed to call for fever, chill, symptoms of infection or other concerns. TIme spent 30 min including >50% counseling and coordination of care. Cc Dr Alain Marion particularly re tachycardia/ beta blocker.   Sharon Holt,LENNIS P, MD   10/19/2015, 1:20 PM

## 2015-10-20 ENCOUNTER — Telehealth: Payer: Self-pay

## 2015-10-20 ENCOUNTER — Other Ambulatory Visit: Payer: Self-pay | Admitting: Oncology

## 2015-10-20 NOTE — Telephone Encounter (Addendum)
S/w pt. She denies any fever, no worsening of her cough, no urinary symptoms. She states her heart feels normal, she has not taken her own pulse but "I can tell" . She verified she is taking tenormin. She has restarted her tenormin and tranxene.

## 2015-10-20 NOTE — Telephone Encounter (Signed)
-----   Message from Gordy Levan, MD sent at 10/20/2015  7:56 AM EST ----- RN please check with her by phone today and 1-5, as she was neutropenic on 1-3. She did have neulasta on 12-30, which does not necessarily prevent drop in [place] but should let it recover quickly. If concerns including any fever or symptoms of infection this week would need cbc and to see symptom management.  She also was more tachycardic on 1-3. She was hospitalized for tachycardia previously when she was not taking beta blocker. She told me 1-3 that she has been using tablets from a previous prescription - RN please have her check pulse if she can and let her read labels from the beta blocker bottle. Dose at hospital DC was Tenormin 75 mg daily. Dr Alain Marion is correct to help if problems with this.  Patient or granddaughter best to take directions, as husband seems to have more difficulty comprehending than patient.

## 2015-10-21 ENCOUNTER — Telehealth: Payer: Self-pay | Admitting: Oncology

## 2015-10-21 NOTE — Telephone Encounter (Signed)
Pt states she is doing well, no fever, no cough, no body aches. No heart racing.

## 2015-10-21 NOTE — Telephone Encounter (Signed)
Appointments added per pof and patient will get a new avs at 1/10 lab

## 2015-10-26 ENCOUNTER — Telehealth: Payer: Self-pay

## 2015-10-26 ENCOUNTER — Other Ambulatory Visit (HOSPITAL_BASED_OUTPATIENT_CLINIC_OR_DEPARTMENT_OTHER): Payer: Commercial Managed Care - HMO

## 2015-10-26 DIAGNOSIS — C541 Malignant neoplasm of endometrium: Secondary | ICD-10-CM

## 2015-10-26 DIAGNOSIS — C786 Secondary malignant neoplasm of retroperitoneum and peritoneum: Secondary | ICD-10-CM

## 2015-10-26 DIAGNOSIS — C801 Malignant (primary) neoplasm, unspecified: Principal | ICD-10-CM

## 2015-10-26 LAB — CBC WITH DIFFERENTIAL/PLATELET
BASO%: 0.4 % (ref 0.0–2.0)
Basophils Absolute: 0 10*3/uL (ref 0.0–0.1)
EOS%: 0.1 % (ref 0.0–7.0)
Eosinophils Absolute: 0 10*3/uL (ref 0.0–0.5)
HCT: 28.9 % — ABNORMAL LOW (ref 34.8–46.6)
HGB: 9.3 g/dL — ABNORMAL LOW (ref 11.6–15.9)
LYMPH#: 1.2 10*3/uL (ref 0.9–3.3)
LYMPH%: 13.2 % — AB (ref 14.0–49.7)
MCH: 29.3 pg (ref 25.1–34.0)
MCHC: 32.1 g/dL (ref 31.5–36.0)
MCV: 91.2 fL (ref 79.5–101.0)
MONO#: 0.7 10*3/uL (ref 0.1–0.9)
MONO%: 8 % (ref 0.0–14.0)
NEUT%: 78.3 % — AB (ref 38.4–76.8)
NEUTROS ABS: 7 10*3/uL — AB (ref 1.5–6.5)
Platelets: 220 10*3/uL (ref 145–400)
RBC: 3.17 10*6/uL — AB (ref 3.70–5.45)
RDW: 15.4 % — ABNORMAL HIGH (ref 11.2–14.5)
WBC: 9 10*3/uL (ref 3.9–10.3)

## 2015-10-26 NOTE — Telephone Encounter (Signed)
Sharon Holt's cbc/diif today was good.  ANC = 7.0 PLT = 220. Sharon Holt states that She feels great.  She is taking the atenolol 25 mg tabs 3 daily.  HR today 68 sitting. She is moving bowels fairly well.  She took a Doculax tab Sunday 10-24-15 in addition to the Miralx as stools were small hard balls.  Excellent result from the Doculax. Suggested that she can take a second capful of Miralax in a day if needed. She is eating better.  Taking in ~2 meals daily.  Suggested that she take 1 Ensure supplement daily until return visit 1-16 and see what her weight is that day and what Dr. Marko Plume suggests for dietary supplements.   She is drinking close to 64 ounces of fluid daily.

## 2015-10-31 ENCOUNTER — Other Ambulatory Visit: Payer: Self-pay | Admitting: Oncology

## 2015-11-01 ENCOUNTER — Encounter: Payer: Self-pay | Admitting: Oncology

## 2015-11-01 ENCOUNTER — Other Ambulatory Visit (HOSPITAL_BASED_OUTPATIENT_CLINIC_OR_DEPARTMENT_OTHER): Payer: Commercial Managed Care - HMO

## 2015-11-01 ENCOUNTER — Other Ambulatory Visit: Payer: Self-pay | Admitting: Oncology

## 2015-11-01 ENCOUNTER — Ambulatory Visit (HOSPITAL_BASED_OUTPATIENT_CLINIC_OR_DEPARTMENT_OTHER): Payer: Commercial Managed Care - HMO | Admitting: Oncology

## 2015-11-01 VITALS — BP 164/60 | HR 103 | Temp 98.2°F | Resp 18 | Ht 66.0 in | Wt 173.0 lb

## 2015-11-01 DIAGNOSIS — D500 Iron deficiency anemia secondary to blood loss (chronic): Secondary | ICD-10-CM

## 2015-11-01 DIAGNOSIS — C786 Secondary malignant neoplasm of retroperitoneum and peritoneum: Secondary | ICD-10-CM

## 2015-11-01 DIAGNOSIS — C541 Malignant neoplasm of endometrium: Secondary | ICD-10-CM | POA: Diagnosis not present

## 2015-11-01 DIAGNOSIS — C801 Malignant (primary) neoplasm, unspecified: Secondary | ICD-10-CM

## 2015-11-01 DIAGNOSIS — N183 Chronic kidney disease, stage 3 unspecified: Secondary | ICD-10-CM

## 2015-11-01 DIAGNOSIS — D701 Agranulocytosis secondary to cancer chemotherapy: Secondary | ICD-10-CM

## 2015-11-01 DIAGNOSIS — D702 Other drug-induced agranulocytosis: Secondary | ICD-10-CM

## 2015-11-01 DIAGNOSIS — D631 Anemia in chronic kidney disease: Secondary | ICD-10-CM

## 2015-11-01 DIAGNOSIS — T451X5A Adverse effect of antineoplastic and immunosuppressive drugs, initial encounter: Secondary | ICD-10-CM

## 2015-11-01 DIAGNOSIS — K219 Gastro-esophageal reflux disease without esophagitis: Secondary | ICD-10-CM

## 2015-11-01 DIAGNOSIS — D509 Iron deficiency anemia, unspecified: Secondary | ICD-10-CM

## 2015-11-01 DIAGNOSIS — E46 Unspecified protein-calorie malnutrition: Secondary | ICD-10-CM

## 2015-11-01 DIAGNOSIS — D6481 Anemia due to antineoplastic chemotherapy: Secondary | ICD-10-CM

## 2015-11-01 LAB — CBC WITH DIFFERENTIAL/PLATELET
BASO%: 0.1 % (ref 0.0–2.0)
Basophils Absolute: 0 10*3/uL (ref 0.0–0.1)
EOS%: 0.1 % (ref 0.0–7.0)
Eosinophils Absolute: 0 10*3/uL (ref 0.0–0.5)
HCT: 28.1 % — ABNORMAL LOW (ref 34.8–46.6)
HGB: 8.9 g/dL — ABNORMAL LOW (ref 11.6–15.9)
LYMPH%: 24.2 % (ref 14.0–49.7)
MCH: 29.5 pg (ref 25.1–34.0)
MCHC: 31.7 g/dL (ref 31.5–36.0)
MCV: 93 fL (ref 79.5–101.0)
MONO#: 1 10*3/uL — AB (ref 0.1–0.9)
MONO%: 14.5 % — AB (ref 0.0–14.0)
NEUT%: 61.1 % (ref 38.4–76.8)
NEUTROS ABS: 4.2 10*3/uL (ref 1.5–6.5)
PLATELETS: 309 10*3/uL (ref 145–400)
RBC: 3.02 10*6/uL — AB (ref 3.70–5.45)
RDW: 17.1 % — ABNORMAL HIGH (ref 11.2–14.5)
WBC: 6.8 10*3/uL (ref 3.9–10.3)
lymph#: 1.7 10*3/uL (ref 0.9–3.3)

## 2015-11-01 LAB — COMPREHENSIVE METABOLIC PANEL
ALT: 11 U/L (ref 0–55)
AST: 30 U/L (ref 5–34)
Albumin: 3.1 g/dL — ABNORMAL LOW (ref 3.5–5.0)
Alkaline Phosphatase: 49 U/L (ref 40–150)
Anion Gap: 7 mEq/L (ref 3–11)
BILIRUBIN TOTAL: 0.41 mg/dL (ref 0.20–1.20)
BUN: 8.8 mg/dL (ref 7.0–26.0)
CO2: 27 meq/L (ref 22–29)
CREATININE: 0.9 mg/dL (ref 0.6–1.1)
Calcium: 9.4 mg/dL (ref 8.4–10.4)
Chloride: 104 mEq/L (ref 98–109)
EGFR: 71 mL/min/{1.73_m2} — ABNORMAL LOW (ref >90)
GLUCOSE: 117 mg/dL (ref 70–140)
Potassium: 4.4 mEq/L (ref 3.5–5.1)
SODIUM: 137 meq/L (ref 136–145)
TOTAL PROTEIN: 7.4 g/dL (ref 6.4–8.3)

## 2015-11-01 LAB — IRON AND TIBC
%SAT: 16 % — ABNORMAL LOW (ref 21–57)
Iron: 38 ug/dL — ABNORMAL LOW (ref 41–142)
TIBC: 234 ug/dL — ABNORMAL LOW (ref 236–444)
UIBC: 195 ug/dL (ref 120–384)

## 2015-11-01 MED ORDER — DEXAMETHASONE 4 MG PO TABS: ORAL_TABLET | ORAL | Status: DC

## 2015-11-01 MED ORDER — FERROUS FUMARATE 325 (106 FE) MG PO TABS: ORAL_TABLET | ORAL | Status: DC

## 2015-11-01 MED ORDER — LORAZEPAM 0.5 MG PO TABS: ORAL_TABLET | ORAL | Status: DC

## 2015-11-01 NOTE — Progress Notes (Signed)
OFFICE PROGRESS NOTE   November 03, 2015   Physicians: [place]/ Emma Rossi,Plotnikov, [name], MD, Gery Pray, [place] [facility] [name]), [name]  INTERVAL HISTORY:   Patient is seen, together with husband, in continuing attention to recurrent high grade endometrial carcinoma, with carcinomatosis. She had cycle 2 carbo taxol on 10-13-15 with neulasta on 12-30, tolerated well. She is due cycle 3 carbo taxol on 11-01-14.  She saw [name] on 09-13-15 , next apt with gyn onc "when NED or if not responding".   Patient did not have any severe body aches after cycle 2 chemo, including with neulasta. She has felt significantly better overall, with better appetite and energy and no reaccumulation of symptomatic ascites. Bowels are moving well daily with colace. She denies peripheral neuropathy. She has slight queasiness at times, uses ativan ~ 1x daily with improvement. She denies symptomatic tachycardia, seems to be taking beta blocker now as prescribed. She denies bleeding, increased SOB, new or different pain, LE swelling, bladder symptoms, fever or symptoms of infection. She is now able to sleep on her stomach, which she could not do with the ascites previously.  Remainder of 10 point Review of Systems negative/ unchanged.  Flu vaccine 08-09-15 No central line No genetics testing  CA 125  09-14-15     2427  ONCOLOGIC HISTORY Patient has had regular medical care by PCP Dr Alain Marion and gyn Dr Garwin Brothers. Sometime after a fall in spring 2016, she noticed low pelvic discomfort and some intermittent vaginal spotting. She was seen for regular visit by Dr Garwin Brothers in May 2016, with PAP positive for AGUS and US showing endometrial stripe 8.72m. Endometrial biopsy 03-12-15 showed high grade adenocarcinoma with papillary features, however immunostains were not typical for serous carcinoma. She was seen by Dr RDenman Georgein June. CT CAP 04-14-15 showed no evidence of metastatic disease in chest, a 3.3 x 2.8  cm endometrial mas and 3.1 cm cystic lesion of right ovary. She had robotic total laparoscopic hysterectomy BSO with bilateral pelvic and para aortic [facility]. Pathology [phone] grade 2 endometrioid adenocarcinoma invading 0.35 cm where myometrium 1.1 cm, 26 nodes negative and no LVSI. She had intracavitary radiation due to risk for vaginal cuff recurrence from 06-01-15 thru 07-01-15, vaginal cuff 27.5 gray in 5 fractions. There were no obvious concerns when she was seen by Dr RDenman Georgein late July or Dr KSondra Comeand Dr PAlain Marionin October. She presented to ED on 09-06-15 with generalized abdominal pain, bloating and constipation x 1 month. CT AP 09-06-15 showed extensive nodularity and stranding thruout omentum and large volume ascites, with liver/spleen/pancreas/adrenals not remarkable, no bowel obstruction. She was admitted x 24 hours, with UKoreaparacentesi on 09-07-15 for 5 liters of serous fluid, cytology (([id number] adenocarcinoma. SHe was much more comfortable after paracentesis and cannot tell that the fluid has reaccumulated as yet. She had consultation with Dr RDenman Georgeon 09-13-15, recommendation for systemic chemotherapy. Review of cytology from ascites compared with surgical specimen and [facility] high grade features that were seen on the initial endometrial biopsy (not seen on the full surgical path). She had repeat UKoreaparacentesis for 5.2 liters on 09-20-15. She had first carboplatin taxol on 09-21-15 and was neutropenic by day 8 cycle 1, granix begun. She had cycle 2 at same doses (carbo AUC = 3.5) 0n 10-13-15 with neulasta on 10-15-15 with neulasta on 12-30, and was neutropenic on day  7.  Objective:  Vital signs in last 24 hours:  BP 164/60 mmHg  Pulse 103  Temp(Src) 98.2 F (36.8 C) (Oral)  Resp 18  Ht '5\' 6"'$  (1.676 m)  Wt 173 lb (78.472 kg)  BMI 27.94 kg/m2  SpO2 100% Weight up 1  lb Alert, oriented and appropriate. Ambulatory without assistance .  Alopecia  HEENT:PERRL, sclerae not icteric. Oral mucosa moist without lesions, posterior pharynx clear.  Neck supple. No JVD.  Lymphatics:no cervical,supraclavicular, axillary or inguinal adenopathy Resp: clear to auscultation bilaterally and normal percussion bilaterally Cardio: tachy but not racing, regular rate and rhythm. No gallop. Clear heart sounds GI: abdomen soft, nontender, less distended tho likely still some ascites by exam, cannot appreciate mass or organomegaly. Normally active bowel sounds. Surgical incision not remarkable. Musculoskeletal/ Extremities: without pitting edema, cords, tenderness Neuro: no significant peripheral neuropathy. Otherwise nonfocal. PSYCH appropriate mood and affect Skin without rash, ecchymosis, petechiae   Lab Results:  Results for orders placed or performed in visit on 11/01/15  CBC with Differential  Result Value Ref Range   WBC 6.8 3.9 - 10.3 10e3/uL   NEUT# 4.2 1.5 - 6.5 10e3/uL   HGB 8.9 (L) 11.6 - 15.9 g/dL   HCT 28.1 (L) 34.8 - 46.6 %   Platelets 309 145 - 400 10e3/uL   MCV 93.0 79.5 - 101.0 fL   MCH 29.5 25.1 - 34.0 pg   MCHC 31.7 31.5 - 36.0 g/dL   RBC 3.02 (L) 3.70 - 5.45 10e6/uL   RDW 17.1 (H) 11.2 - 14.5 %   lymph# 1.7 0.9 - 3.3 10e3/uL   MONO# 1.0 (H) 0.1 - 0.9 10e3/uL   Eosinophils Absolute 0.0 0.0 - 0.5 10e3/uL   Basophils Absolute 0.0 0.0 - 0.1 10e3/uL   NEUT% 61.1 38.4 - 76.8 %   LYMPH% 24.2 14.0 - 49.7 %   MONO% 14.5 (H) 0.0 - 14.0 %   EOS% 0.1 0.0 - 7.0 %   BASO% 0.1 0.0 - 2.0 %  Comprehensive metabolic panel  Result Value Ref Range   Sodium 137 136 - 145 mEq/L   Potassium 4.4 3.5 - 5.1 mEq/L   Chloride 104 98 - 109 mEq/L   CO2 27 22 - 29 mEq/L   Glucose 117 70 - 140 mg/dl   BUN 8.8 7.0 - 26.0 mg/dL   Creatinine 0.9 0.6 - 1.1 mg/dL   Total Bilirubin 0.41 0.20 - 1.20 mg/dL   Alkaline Phosphatase 49 40 - 150 U/L   AST 30 5 - 34 U/L   ALT 11  0 - 55 U/L   Total Protein 7.4 6.4 - 8.3 g/dL   Albumin 3.1 (L) 3.5 - 5.0 g/dL   Calcium 9.4 8.4 - 10.4 mg/dL   Anion Gap 7 3 - 11 mEq/L   EGFR 71 (L) >90 ml/min/1.73 m2  CA 125  Result Value Ref Range   Cancer Antigen (CA) 125 867.5 (H) 0.0 - 38.1 U/mL  CA 125 (Parallel Testing)  Result Value Ref Range   CA 125 1148 (H) <35 U/mL  Iron and TIBC  Result Value Ref Range   Iron 38 (L) 41 - 142 ug/dL   TIBC 234 (L) 236 - 444 ug/dL   UIBC 195 120 - 384 ug/dL   %SAT 16 (L) 21 - 57 %   iron and CA 125 available after visit.  CA 125 was 2427 09-14-15 as baseline for chemo  Studies/Results:  No results found.  Medications: I have reviewed the patient's  current medications. Add ferrous fumarate 325 mg daily on empty stomach with OJ, directions given orally and written for patient  DISCUSSION Clinically improving on chemotherapy now and with other medications adjusted. She is very willing to continue treatment as planned with cycle 3 carbo taxol on 11-02-15 and neulasta on 11-04-15. I will see her with labs 11-11-15. She and husband aware that they can call at any time if questions or concerns, tho note she has had difficulty keeping up with medications and husband similarly has had a difficult time with the medical issues.     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 Massive ascites, with 5 liters removed 09-07-15 , 5.2 liters on 09-20-15, and 1.6 liters on 09-28-15. Chemotherapy begun with carbo taxol 09-21-15, . Note previous treatment including radiation for anal cancer may be impacting counts. Clinically improving, continue chemo and close follow up. 2. Chemo neutropenia by day 8 cycle 1, and neutropenic day 7 cycle 2  despite  neulaasta day 3 cycle 2 (and dose reduced [place]). See above re  previous pelvic RT. Discussed with [facility] pharmacist and have dose reduced carbo again slightly for cycle 3, will also use neulasta. CODERS NOTE: she is not neutropenic today, however this information is fully relevant to problem list for this visit.  Note neulasta best given be given at [facility] as some noncompliance and comprehension concerns.  3.Admission for tachycardia after cycle 1, HR sustained 150-160, no other findings and resolved when beta blocker was resumed, this DCd in error by patient then. Tachycardia improved, seems to be taking meds correctly. Dr Alain Marion aware. 4.chemo anemia, anemia of CKD and iron deficiency anemia:  hgb slightly lower, tho not symptomatic to point of requiring transfusion now. May need PRBCs if <8.5 or symptomatic. Add oral iron, consider IV iron if not tolerated. 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 No adjuvant chemotherapy with those surgical findings. Initial biopsy specimen only had a high grade component [facility] path), which is identical to this recurrent disease. 6.constipation: better with present bowel regimen. Abdominal xray 12-28 no obstruction.  7.renal insufficiency: CKD 3. Ascites clinically improving and now looks well hydrated. Now off HCTZ and now off spironolactone. No hydronephrosis. Did tolerate CTA chest contrast recent hospital admission, but would be cautious about IV contrast. Dosing carbo with AUC. 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 Managed by Dr Alain Marion 10.post cholecystectomy and up to date colonoscopy and mammograms 11. Poor nutritional status: po intake improving by history, [place] to follow  up day of chemo 12.squamous cell carcinoma anus 2003: radiation and may have had chemo then, may be impacting counts with present chemo. Records requested from archives, not yet available.   All questions answered and patient/ husband are in agreement with plans as above. Chemo and neulasta orders confirmed. Time spent 30 min including >50% counseling and coordination of care. Cc Dr Lajuana Carry   Gordy Levan, MD   11/03/2015, 3:43 PM

## 2015-11-02 ENCOUNTER — Ambulatory Visit: Payer: Commercial Managed Care - HMO | Admitting: Nutrition

## 2015-11-02 ENCOUNTER — Telehealth: Payer: Self-pay | Admitting: Oncology

## 2015-11-02 ENCOUNTER — Ambulatory Visit (HOSPITAL_BASED_OUTPATIENT_CLINIC_OR_DEPARTMENT_OTHER): Payer: Commercial Managed Care - HMO

## 2015-11-02 VITALS — BP 167/85 | HR 108 | Temp 98.3°F | Resp 19 | Ht 66.0 in

## 2015-11-02 DIAGNOSIS — C541 Malignant neoplasm of endometrium: Secondary | ICD-10-CM

## 2015-11-02 DIAGNOSIS — Z5111 Encounter for antineoplastic chemotherapy: Secondary | ICD-10-CM

## 2015-11-02 DIAGNOSIS — C801 Malignant (primary) neoplasm, unspecified: Secondary | ICD-10-CM | POA: Diagnosis not present

## 2015-11-02 DIAGNOSIS — C786 Secondary malignant neoplasm of retroperitoneum and peritoneum: Secondary | ICD-10-CM | POA: Diagnosis not present

## 2015-11-02 LAB — CANCER ANTIGEN 125 (PARALLEL TESTING): CA 125: 1148 U/mL — ABNORMAL HIGH (ref <35)

## 2015-11-02 LAB — CA 125: CANCER ANTIGEN (CA) 125: 867.5 U/mL — AB (ref 0.0–38.1)

## 2015-11-02 MED ORDER — DIPHENHYDRAMINE HCL 50 MG/ML IJ SOLN
25.0000 mg | Freq: Once | INTRAMUSCULAR | Status: AC
  Administered 2015-11-02: 25 mg via INTRAVENOUS

## 2015-11-02 MED ORDER — FAMOTIDINE IN NACL 20-0.9 MG/50ML-% IV SOLN
INTRAVENOUS | Status: AC
  Filled 2015-11-02: qty 50

## 2015-11-02 MED ORDER — PACLITAXEL CHEMO INJECTION 300 MG/50ML
175.0000 mg/m2 | Freq: Once | INTRAVENOUS | Status: AC
  Administered 2015-11-02: 342 mg via INTRAVENOUS
  Filled 2015-11-02: qty 57

## 2015-11-02 MED ORDER — SODIUM CHLORIDE 0.9 % IV SOLN
270.0000 mg | Freq: Once | INTRAVENOUS | Status: AC
  Administered 2015-11-02: 270 mg via INTRAVENOUS
  Filled 2015-11-02: qty 27

## 2015-11-02 MED ORDER — SODIUM CHLORIDE 0.9 % IV SOLN
Freq: Once | INTRAVENOUS | Status: AC
  Administered 2015-11-02: 14:00:00 via INTRAVENOUS
  Filled 2015-11-02: qty 8

## 2015-11-02 MED ORDER — SODIUM CHLORIDE 0.9 % IV SOLN
Freq: Once | INTRAVENOUS | Status: AC
  Administered 2015-11-02: 14:00:00 via INTRAVENOUS

## 2015-11-02 MED ORDER — DIPHENHYDRAMINE HCL 50 MG/ML IJ SOLN
INTRAMUSCULAR | Status: AC
  Filled 2015-11-02: qty 1

## 2015-11-02 MED ORDER — FAMOTIDINE IN NACL 20-0.9 MG/50ML-% IV SOLN
20.0000 mg | Freq: Once | INTRAVENOUS | Status: AC
  Administered 2015-11-02: 20 mg via INTRAVENOUS

## 2015-11-02 NOTE — Telephone Encounter (Signed)
Added appointments for 2/7 and 2/9. Left message for patient and patient to get new schedule 1/19.

## 2015-11-02 NOTE — Patient Instructions (Signed)
[facility] Discharge Instructions for Patients Receiving Chemotherapy  Today you received the following chemotherapy agents:  Taxol, Carboplatin To help prevent nausea and vomiting after your treatment, we encourage you to take your nausea medication.   If you develop nausea and vomiting that is not controlled by your nausea medication, call the clinic.   BELOW ARE SYMPTOMS THAT SHOULD BE REPORTED IMMEDIATELY:  *FEVER GREATER THAN 100.5 F  *CHILLS WITH OR WITHOUT FEVER  NAUSEA AND VOMITING THAT IS NOT CONTROLLED WITH YOUR NAUSEA MEDICATION  *UNUSUAL SHORTNESS OF BREATH  *UNUSUAL BRUISING OR BLEEDING  TENDERNESS IN MOUTH AND THROAT WITH OR WITHOUT PRESENCE OF ULCERS  *URINARY PROBLEMS  *BOWEL PROBLEMS  UNUSUAL RASH Items with * indicate a potential emergency and should be followed up as soon as possible.  Feel free to call the clinic you have any questions or concerns. The clinic phone number is [phone].  Please show the CHEMO ALERT CARD at check-in to the Emergency Department and triage nurse.   

## 2015-11-02 NOTE — Progress Notes (Signed)
Nutrition follow-up completed with patient during infusion.Patient is receiving chemotherapy for endometrial cancer. Weight is stable and documented as 173 pounds January 16. Patient reports she is eating better. She is working on her constipation. She does not have additional questions today.  Nutrition diagnosis: Unintended weight loss has improved.  Intervention: Patient educated to continue strategies for adequate calories and protein intake to promote weight maintenance. Patient encouraged to continue oral nutrition supplements as needed. Reviewed strategies for improving constipation. Teach back method was used.  Monitoring, evaluation, goals: Patient will tolerate adequate calories and protein to minimize weight loss.  Next visit: Tuesday, February 7, during infusion.  **Disclaimer: This note was dictated with voice recognition software. Similar sounding words can inadvertently be transcribed and this note may contain transcription errors which may not have been corrected upon publication of note.**

## 2015-11-03 ENCOUNTER — Telehealth: Payer: Self-pay | Admitting: *Deleted

## 2015-11-03 DIAGNOSIS — D509 Iron deficiency anemia, unspecified: Secondary | ICD-10-CM | POA: Insufficient documentation

## 2015-11-03 NOTE — Telephone Encounter (Signed)
"  With my last treatment I was instructed to take claritin for seven days.  Should I do this again and when do I start?"  Advised she follow previous instructions by Dr Marko Plume.  No further questions.

## 2015-11-04 ENCOUNTER — Ambulatory Visit (HOSPITAL_BASED_OUTPATIENT_CLINIC_OR_DEPARTMENT_OTHER): Payer: Commercial Managed Care - HMO

## 2015-11-04 VITALS — BP 155/93 | HR 97 | Temp 98.6°F

## 2015-11-04 DIAGNOSIS — Z5189 Encounter for other specified aftercare: Secondary | ICD-10-CM | POA: Diagnosis not present

## 2015-11-04 DIAGNOSIS — C786 Secondary malignant neoplasm of retroperitoneum and peritoneum: Secondary | ICD-10-CM

## 2015-11-04 DIAGNOSIS — C801 Malignant (primary) neoplasm, unspecified: Secondary | ICD-10-CM

## 2015-11-04 DIAGNOSIS — C541 Malignant neoplasm of endometrium: Secondary | ICD-10-CM

## 2015-11-04 MED ORDER — PEGFILGRASTIM INJECTION 6 MG/0.6ML ~~LOC~~
6.0000 mg | PREFILLED_SYRINGE | Freq: Once | SUBCUTANEOUS | Status: AC
Start: 2015-11-04 — End: 2015-11-04
  Administered 2015-11-04: 6 mg via SUBCUTANEOUS
  Filled 2015-11-04: qty 0.6

## 2015-11-09 ENCOUNTER — Encounter: Payer: Self-pay | Admitting: Internal Medicine

## 2015-11-09 ENCOUNTER — Ambulatory Visit (INDEPENDENT_AMBULATORY_CARE_PROVIDER_SITE_OTHER): Payer: Commercial Managed Care - HMO | Admitting: Internal Medicine

## 2015-11-09 VITALS — BP 130/80 | HR 106 | Wt 166.0 lb

## 2015-11-09 DIAGNOSIS — R188 Other ascites: Secondary | ICD-10-CM

## 2015-11-09 DIAGNOSIS — R000 Tachycardia, unspecified: Secondary | ICD-10-CM

## 2015-11-09 DIAGNOSIS — C541 Malignant neoplasm of endometrium: Secondary | ICD-10-CM

## 2015-11-09 MED ORDER — POTASSIUM CHLORIDE CRYS ER 20 MEQ PO TBCR: 20.0000 meq | EXTENDED_RELEASE_TABLET | Freq: Every day | ORAL | Status: AC

## 2015-11-09 MED ORDER — ATENOLOL 25 MG PO TABS: 75.0000 mg | ORAL_TABLET | Freq: Every day | ORAL | Status: DC

## 2015-11-09 NOTE — Assessment & Plan Note (Signed)
Better on chemo

## 2015-11-09 NOTE — Progress Notes (Signed)
Pre visit review using our clinic review tool, if applicable. No additional management support is needed unless otherwise documented below in the visit note. 

## 2015-11-09 NOTE — Assessment & Plan Note (Signed)
Better on Atenolol and after chemo Good hydration

## 2015-11-09 NOTE — Progress Notes (Signed)
Subjective:  Patient ID: Sharon Holt, female    DOB: 1941/04/01  Age: 75 y.o. MRN: [id number]  CC: No chief complaint on file.   HPI Sharon Holt presents for tachycardia, ascitis, HTN f/u  Outpatient Prescriptions Prior to Visit  Medication Sig Dispense Refill  . atenolol (TENORMIN) 25 MG tablet Take 3 tablets (75 mg total) by mouth daily. 45 tablet 1  . Cholecalciferol (VITAMIN D3) 2000 UNITS capsule Take 2,000 Units by mouth daily.     . clorazepate (TRANXENE) 7.5 MG tablet Take 1 tablet (7.5 mg total) by mouth 2 (two) times daily as needed for anxiety. 180 tablet 1  . dexamethasone (DECADRON) 4 MG tablet Take 5 tablets by mouth with food 12 hours and 6 hours prior to taxol chemotherapy. 10 tablet 2  . feeding supplement, ENSURE ENLIVE, (ENSURE ENLIVE) LIQD Take 237 mLs by mouth 2 (two) times daily between meals. 237 mL 12  . LORazepam (ATIVAN) 0.5 MG tablet Place 1 tablet under the tongue or swallow every 6 hours as needed for nausea. 20 tablet 0  . Multiple Vitamins-Minerals (CENTRUM SILVER PO) Take 1 capsule by mouth daily.     . ondansetron (ZOFRAN) 8 MG tablet Take 0.5-1 tablets (4-8 mg total) by mouth every 8 (eight) hours as needed for nausea, vomiting or refractory nausea / vomiting. Will NOT make drowsy. 30 tablet 2  . pantoprazole (PROTONIX) 40 MG tablet Take 1 tablet (40 mg total) by mouth daily. For acid reflux 30 tablet 2  . polyethylene glycol (MIRALAX / GLYCOLAX) packet Take 17 g by mouth daily.    . ferrous fumarate (HEMOCYTE - 106 MG FE) 325 (106 Fe) MG TABS tablet Take 1 tablet by mouth daily on an empty stomach with OJ. 30 each 3   No facility-administered medications prior to visit.    ROS Review of Systems  Constitutional: Positive for fatigue. Negative for chills, activity change, appetite change and unexpected weight change.  HENT: Negative for congestion, mouth sores and sinus pressure.   Eyes: Negative for visual disturbance.  Respiratory:  Positive for shortness of breath. Negative for cough, chest tightness and wheezing.   Cardiovascular: Positive for palpitations. Negative for chest pain and leg swelling.  Gastrointestinal: Negative for nausea, abdominal pain and abdominal distention.  Genitourinary: Negative for frequency, difficulty urinating and vaginal pain.  Musculoskeletal: Negative for back pain and gait problem.  Skin: Negative for pallor and rash.  Neurological: Negative for dizziness, tremors, weakness, numbness and headaches.  Psychiatric/Behavioral: Negative for suicidal ideas, confusion and sleep disturbance. The patient is not nervous/anxious.     Objective:  BP 130/80 mmHg  Pulse 106  Wt 166 lb (75.297 kg)  SpO2 97%  BP Readings from Last 3 Encounters:  11/09/15 130/80  11/04/15 155/93  11/02/15 167/85    Wt Readings from Last 3 Encounters:  11/09/15 166 lb (75.297 kg)  11/01/15 173 lb (78.472 kg)  10/19/15 172 lb 8 oz (78.245 kg)    Physical Exam  Constitutional: She appears well-developed. No distress.  HENT:  Head: Normocephalic.  Right Ear: External ear normal.  Left Ear: External ear normal.  Nose: Nose normal.  Mouth/Throat: Oropharynx is clear and moist.  Eyes: Conjunctivae are normal. Pupils are equal, round, and reactive to light. Right eye exhibits no discharge. Left eye exhibits no discharge.  Neck: Normal range of motion. Neck supple. No JVD present. No tracheal deviation present. No thyromegaly present.  Cardiovascular: Normal rate, regular rhythm and normal heart sounds.  Pulmonary/Chest: No stridor. No respiratory distress. She has no wheezes.  Abdominal: Soft. Bowel sounds are normal. She exhibits no distension and no mass. There is no tenderness. There is no rebound and no guarding.  Musculoskeletal: She exhibits no edema or tenderness.  Lymphadenopathy:    She has no cervical adenopathy.  Neurological: She displays normal reflexes. No cranial nerve deficit. She exhibits  normal muscle tone. Coordination normal.  Skin: No rash noted. No erythema. There is pallor.  Psychiatric: She has a normal mood and affect. Her behavior is normal. Judgment and thought content normal.    Lab Results  Component Value Date   WBC 6.8 11/01/2015   HGB 8.9* 11/01/2015   HCT 28.1* 11/01/2015   PLT 309 11/01/2015   GLUCOSE 117 11/01/2015   CHOL 185 09/25/2014   TRIG 86.0 09/25/2014   HDL 63.70 09/25/2014   LDLDIRECT 100.6 02/13/2012   LDLCALC 104* 09/25/2014   ALT 11 11/01/2015   AST 30 11/01/2015   NA 137 11/01/2015   K 4.4 11/01/2015   CL 101 09/28/2015   CREATININE 0.9 11/01/2015   BUN 8.8 11/01/2015   CO2 27 11/01/2015   TSH 0.881 09/27/2015   HGBA1C 5.4 02/13/2013    Dg Abd 1 View  10/12/2015  CLINICAL DATA:  Bloating. EXAM: ABDOMEN - 1 VIEW COMPARISON:  None. FINDINGS: Soft tissue structures are unremarkable. No bowel distention. Moderate amount of stool noted throughout the colon . No free air. Surgical clips right upper quadrant. Calcified densities noted over the abdomen and pelvis consistent with injection granulomas and phleboliths. A ureteral stone cannot be entirely excluded. IMPRESSION: No acute abnormality. Moderate amount of stool noted throughout colon . Electronically Signed   By: Marcello Moores  Register   On: 10/12/2015 10:05    Assessment & Plan:   There are no diagnoses linked to this encounter. I have discontinued Ms. Bossman's ferrous fumarate. I am also having her maintain her Multiple Vitamins-Minerals (CENTRUM SILVER PO), Vitamin D3, clorazepate, ondansetron, atenolol, feeding supplement (ENSURE ENLIVE), pantoprazole, polyethylene glycol, dexamethasone, LORazepam, and FERROCITE.  Meds ordered this encounter  Medications  . FERROCITE 324 MG TABS    Sig: Take 1 tablet by mouth daily.    Refill:  0     Follow-up: No Follow-up on file.  Walker Kehr, MD

## 2015-11-09 NOTE — Assessment & Plan Note (Signed)
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 Most likely this is associated with endometrial carcinoma, tho this appeared stage 1 grade 2 at [facility] BSO and node evaluation 04-20-15. Immunostains pending on ascites specimen; as long as nothing unexpected with this, she will begin q 3 week carbo taxol in near future. We will let her know about pathology information by phone. Chemo education class this week. Prn paracenteses now. 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   On chemo

## 2015-11-10 ENCOUNTER — Other Ambulatory Visit: Payer: Self-pay | Admitting: Oncology

## 2015-11-10 DIAGNOSIS — C801 Malignant (primary) neoplasm, unspecified: Principal | ICD-10-CM

## 2015-11-10 DIAGNOSIS — C786 Secondary malignant neoplasm of retroperitoneum and peritoneum: Secondary | ICD-10-CM

## 2015-11-11 ENCOUNTER — Encounter: Payer: Self-pay | Admitting: Oncology

## 2015-11-11 ENCOUNTER — Other Ambulatory Visit (HOSPITAL_BASED_OUTPATIENT_CLINIC_OR_DEPARTMENT_OTHER): Payer: Commercial Managed Care - HMO

## 2015-11-11 ENCOUNTER — Telehealth: Payer: Self-pay | Admitting: Emergency Medicine

## 2015-11-11 ENCOUNTER — Ambulatory Visit (HOSPITAL_BASED_OUTPATIENT_CLINIC_OR_DEPARTMENT_OTHER): Payer: Commercial Managed Care - HMO | Admitting: Oncology

## 2015-11-11 ENCOUNTER — Telehealth: Payer: Self-pay | Admitting: Oncology

## 2015-11-11 VITALS — BP 155/77 | HR 105 | Temp 99.0°F | Resp 18 | Ht 66.0 in | Wt 170.0 lb

## 2015-11-11 DIAGNOSIS — C541 Malignant neoplasm of endometrium: Secondary | ICD-10-CM

## 2015-11-11 DIAGNOSIS — C801 Malignant (primary) neoplasm, unspecified: Principal | ICD-10-CM

## 2015-11-11 DIAGNOSIS — D6481 Anemia due to antineoplastic chemotherapy: Secondary | ICD-10-CM | POA: Diagnosis not present

## 2015-11-11 DIAGNOSIS — D701 Agranulocytosis secondary to cancer chemotherapy: Secondary | ICD-10-CM

## 2015-11-11 DIAGNOSIS — C786 Secondary malignant neoplasm of retroperitoneum and peritoneum: Secondary | ICD-10-CM

## 2015-11-11 DIAGNOSIS — D500 Iron deficiency anemia secondary to blood loss (chronic): Secondary | ICD-10-CM | POA: Diagnosis not present

## 2015-11-11 DIAGNOSIS — T451X5A Adverse effect of antineoplastic and immunosuppressive drugs, initial encounter: Secondary | ICD-10-CM

## 2015-11-11 LAB — COMPREHENSIVE METABOLIC PANEL
ALT: 14 U/L (ref 0–55)
ANION GAP: 8 meq/L (ref 3–11)
AST: 36 U/L — AB (ref 5–34)
Albumin: 3.5 g/dL (ref 3.5–5.0)
Alkaline Phosphatase: 73 U/L (ref 40–150)
BUN: 8.5 mg/dL (ref 7.0–26.0)
CO2: 27 meq/L (ref 22–29)
CREATININE: 1 mg/dL (ref 0.6–1.1)
Calcium: 9.6 mg/dL (ref 8.4–10.4)
Chloride: 102 mEq/L (ref 98–109)
EGFR: 68 mL/min/{1.73_m2} — ABNORMAL LOW (ref >90)
GLUCOSE: 128 mg/dL (ref 70–140)
Potassium: 4.8 mEq/L (ref 3.5–5.1)
Sodium: 137 mEq/L (ref 136–145)
TOTAL PROTEIN: 7.6 g/dL (ref 6.4–8.3)

## 2015-11-11 LAB — CBC WITH DIFFERENTIAL/PLATELET
BASO%: 0.2 % (ref 0.0–2.0)
Basophils Absolute: 0 10*3/uL (ref 0.0–0.1)
EOS%: 0.2 % (ref 0.0–7.0)
Eosinophils Absolute: 0 10*3/uL (ref 0.0–0.5)
HCT: 27.1 % — ABNORMAL LOW (ref 34.8–46.6)
HGB: 8.7 g/dL — ABNORMAL LOW (ref 11.6–15.9)
LYMPH#: 2.1 10*3/uL (ref 0.9–3.3)
LYMPH%: 11.9 % — AB (ref 14.0–49.7)
MCH: 29.7 pg (ref 25.1–34.0)
MCHC: 32 g/dL (ref 31.5–36.0)
MCV: 93 fL (ref 79.5–101.0)
MONO#: 2.3 10*3/uL — AB (ref 0.1–0.9)
MONO%: 12.5 % (ref 0.0–14.0)
NEUT%: 75.2 % (ref 38.4–76.8)
NEUTROS ABS: 13.5 10*3/uL — AB (ref 1.5–6.5)
PLATELETS: 244 10*3/uL (ref 145–400)
RBC: 2.92 10*6/uL — AB (ref 3.70–5.45)
RDW: 18.4 % — ABNORMAL HIGH (ref 11.2–14.5)
WBC: 18 10*3/uL — AB (ref 3.9–10.3)

## 2015-11-11 NOTE — Progress Notes (Signed)
OFFICE PROGRESS NOTE   November 11, 2015   Physicians:W.Brewster/ Emma Rossi,Plotnikov, [name], MD, Gery Pray, [facility] [name]), [name]  INTERVAL HISTORY:  Patient is seen, together with husband, in continuing attention to recurrent high grade endometrial carcinoma, with carcinomatosis. She has had improvement clinically now with 3 cycles of carboplatin taxil thru 11-02-15, with neulasta on 11-04-15.  Patient and husband tell me that she did well with most recent chemotherapy, with no nausea, no neuropathy and no progressive ascites. She used ibuprofen x 2 days for aches after taxol and granix, those now resolved. Bowels are moving regularly, no abdominal or pelvic pain, no bleeding, no LE swelling. Appetite is good and she would like to go to church this weekend, [place] fine. She denies SOB with walking in office today, and otherwise not noticeably more weak or more symptomatic from the anemia. No fever or symptoms of infection. Only discomfort is right lower back at times. No palpitations or chest pain. Remainder of 10 point Review of Systems negative/ unchanged.   Flu vaccine 08-09-15 No central line No genetics testing  CA 125 09-14-15 2427   ONCOLOGIC HISTORY Patient has had regular medical care by PCP Dr Alain Marion and gyn Dr Garwin Brothers. Sometime after a fall in spring 2016, she noticed low pelvic discomfort and some intermittent vaginal spotting. She was seen for regular visit by Dr Garwin Brothers in May 2016, with PAP positive for AGUS and US showing endometrial stripe 8.23m. Endometrial biopsy 03-12-15 showed high grade adenocarcinoma with papillary features, however immunostains were not typical for serous carcinoma. She was seen by Dr RDenman Georgein June. CT CAP 04-14-15 showed no evidence of metastatic disease in chest, a 3.3 x 2.8 cm endometrial mas and 3.1 cm cystic lesion of right ovary. She had robotic total laparoscopic hysterectomy BSO with bilateral pelvic and para aortic  [facility]. Pathology ([phone] grade 2 endometrioid adenocarcinoma invading 0.35 cm where myometrium 1.1 cm, 26 nodes negative and no LVSI. She had intracavitary radiation due to risk for vaginal cuff recurrence from 06-01-15 thru 07-01-15, vaginal cuff 27.5 gray in 5 fractions. There were no obvious concerns when she was seen by Dr RDenman Georgein late July or Dr KSondra Comeand Dr PAlain Marionin October. She presented to ED on 09-06-15 with generalized abdominal pain, bloating and constipation x 1 month. CT AP 09-06-15 showed extensive nodularity and stranding thruout omentum and large volume ascites, with liver/spleen/pancreas/adrenals not remarkable, no bowel obstruction. She was admitted x 24 hours, with UKoreaparacentesi on 09-07-15 for 5 liters of serous fluid, cytology (([id number] adenocarcinoma. SHe was much more comfortable after paracentesis and cannot tell that the fluid has reaccumulated as yet. She had consultation with Dr RDenman Georgeon 09-13-15, recommendation for systemic chemotherapy. Review of cytology from ascites compared with surgical specimen and [facility] high grade features that were seen on the initial endometrial biopsy (not seen on the full surgical path). She had repeat UKoreaparacentesis for 5.2 liters on 09-20-15. She had first carboplatin taxol on 09-21-15 and was neutropenic by day 8 cycle 1, granix begun. She had cycle 2 at same doses (carbo AUC = 3.5) 0n 10-13-15 with neulasta on 10-15-15 with neulasta on 12-30, and was neutropenic on day 7.   Objective:  Vital signs in last 24 hours:  BP 155/77 mmHg  Pulse 105  Temp(Src) 99 F (37.2 C) (Oral)  Resp 18  Ht 5'  6" (1.676 m)  Wt 170 lb (77.111 kg)  BMI 27.45 kg/m2  SpO2 98% Weight down 3 lbs Alert, oriented and appropriate. Ambulatory without assistance. Respirations not labored and otherwise looks comfortable seated in exam  room. Alopecia  HEENT:PERRL, sclerae not icteric. Oral mucosa moist without lesions, posterior pharynx clear.  Neck supple. No JVD.  Lymphatics:no cervical,supraclavicular adenopathy Resp: clear to auscultation bilaterally  Cardio: regular rate and rhythm. No gallop. HR 105 noted. GI: soft, nontender, not more distended, cannot appreciate mass or organomegaly, no clear fluid wave. Some bowel sounds.  Musculoskeletal/ Extremities: without pitting edema, cords, tenderness Neuro: no peripheral neuropathy. Otherwise nonfocal. PSYCH appropriate mood and affect Skin without rash, ecchymosis, petechiae   Lab Results:  Results for orders placed or performed in visit on 11/11/15  CBC with Differential  Result Value Ref Range   WBC 18.0 (H) 3.9 - 10.3 10e3/uL   NEUT# 13.5 (H) 1.5 - 6.5 10e3/uL   HGB 8.7 (L) 11.6 - 15.9 g/dL   HCT 27.1 (L) 34.8 - 46.6 %   Platelets 244 145 - 400 10e3/uL   MCV 93.0 79.5 - 101.0 fL   MCH 29.7 25.1 - 34.0 pg   MCHC 32.0 31.5 - 36.0 g/dL   RBC 2.92 (L) 3.70 - 5.45 10e6/uL   RDW 18.4 (H) 11.2 - 14.5 %   lymph# 2.1 0.9 - 3.3 10e3/uL   MONO# 2.3 (H) 0.1 - 0.9 10e3/uL   Eosinophils Absolute 0.0 0.0 - 0.5 10e3/uL   Basophils Absolute 0.0 0.0 - 0.1 10e3/uL   NEUT% 75.2 38.4 - 76.8 %   LYMPH% 11.9 (L) 14.0 - 49.7 %   MONO% 12.5 0.0 - 14.0 %   EOS% 0.2 0.0 - 7.0 %   BASO% 0.2 0.0 - 2.0 %  Comprehensive metabolic panel  Result Value Ref Range   Sodium 137 136 - 145 mEq/L   Potassium 4.8 3.5 - 5.1 mEq/L   Chloride 102 98 - 109 mEq/L   CO2 27 22 - 29 mEq/L   Glucose 128 70 - 140 mg/dl   BUN 8.5 7.0 - 26.0 mg/dL   Creatinine 1.0 0.6 - 1.1 mg/dL   Total Bilirubin <0.30 0.20 - 1.20 mg/dL   Alkaline Phosphatase 73 40 - 150 U/L   AST 36 (H) 5 - 34 U/L   ALT 14 0 - 55 U/L   Total Protein 7.6 6.4 - 8.3 g/dL   Albumin 3.5 3.5 - 5.0 g/dL   Calcium 9.6 8.4 - 10.4 mg/dL   Anion Gap 8 3 - 11 mEq/L   EGFR 68 (L) >90 ml/min/1.73 m2   CA 125 on 11-01-15 1148 by prior  lab method (and 867 by new method)  Iron studies low on 11-01-15 with serum iron 38 and %sat 16  Studies/Results:  No results found.  Medications: I have reviewed the patient's current medications. She has been taking ferrous fumarate since 11-03-15, on empty stomach with OJ as instructed.  DISCUSSION Discussed decrease in marker by ~ 50% since starting chemotherapy. She is in agreement with continuing treatment, due cycle 4 on 11-23-15 with neulasta on 11-25-15  Discussed symptoms of anemia, as she may need PRBCs and/or IV iron if hemoglobin continues to drop or if more symptomatic. She will continue oral iron now.  Assessment/Plan:  1.Abdominal carcinomatosis from high grade component of endometrial carcinoma: cytology same as high grade component found only on the original endometrial biopsy specimen (sent to [facility]), not in surgical path from 04-20-15. Massive  ascites, with 5 liters removed 09-07-15 , 5.2 liters on 09-20-15, and 1.6 liters on 09-28-15. Chemotherapy begun with carbo taxol 09-21-15,  Tolerating present doses with neulasta support . Note previous treatment including radiation for anal cancer may be impacting counts. Clinically improving, continue chemo and close follow up. 2. Chemo neutropenia by day 8 cycle 1, and neutropenic day 7 cycle 2 despite neulaasta day 3 cycle 2 (and dose reduced [place]). See above re previous pelvic RT. WIll keep carbo dose same as cycle 3.  Note neulasta best given be given at [facility] as some noncompliance and comprehension concerns.  3.Admission for tachycardia after cycle 1, HR sustained 150-160, no other findings and resolved when beta blocker was resumed, this DCd in error by patient then. Tachycardia improved, seems to be taking meds correctly. Dr Alain Marion aware. 4.chemo anemia, anemia of CKD and iron deficiency anemia: hgb slightly lower at 8.7 now, tho not symptomatic to point of requiring transfusion. May need PRBCs if <8.5 or symptomatic.  Continue oral iron and follow 5.Grade 2 stage 1 endometrioid endometrial carcinoma at [facility] BSO and 26 node evaluation 04-20-15 by Dr Skeet Latch, followed by intracavitary radiation by Dr Sondra Come. No adjuvant chemotherapy with those surgical findings. Initial biopsy specimen only had a high grade component [facility] path), which is identical to this recurrent disease. 6.constipation: better with present bowel regimen.   7.renal insufficiency: CKD 3. Ascites clinically improving and now looks well hydrated. Now off HCTZ and now off spironolactone. No hydronephrosis. Did tolerate CTA chest contrast recent hospital admission, but would be cautious about IV contrast. Dosing carbo with AUC. 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 Managed by Dr Alain Marion 10.post cholecystectomy and up to date colonoscopy and mammograms 11. 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 Records requested from archives, not yet available.   All questions answered and patient is in agreement with recommendations and plan. Chemo and granix orders confirmed. Time spent 25 min including >50% counseling and coordination of care.    Gordy Levan, MD   11/11/2015, 4:54 PM

## 2015-11-11 NOTE — Telephone Encounter (Signed)
Appointments made and avs printed for patient °

## 2015-11-13 DIAGNOSIS — D500 Iron deficiency anemia secondary to blood loss (chronic): Secondary | ICD-10-CM | POA: Insufficient documentation

## 2015-11-16 ENCOUNTER — Encounter: Payer: Self-pay | Admitting: Gastroenterology

## 2015-11-18 ENCOUNTER — Other Ambulatory Visit: Payer: Self-pay

## 2015-11-18 ENCOUNTER — Ambulatory Visit: Payer: Commercial Managed Care - HMO

## 2015-11-18 DIAGNOSIS — C786 Secondary malignant neoplasm of retroperitoneum and peritoneum: Secondary | ICD-10-CM

## 2015-11-18 DIAGNOSIS — C541 Malignant neoplasm of endometrium: Secondary | ICD-10-CM

## 2015-11-18 DIAGNOSIS — C801 Malignant (primary) neoplasm, unspecified: Secondary | ICD-10-CM

## 2015-11-18 DIAGNOSIS — K219 Gastro-esophageal reflux disease without esophagitis: Secondary | ICD-10-CM

## 2015-11-18 MED ORDER — LORAZEPAM 0.5 MG PO TABS: ORAL_TABLET | ORAL | Status: DC

## 2015-11-22 ENCOUNTER — Ambulatory Visit: Payer: Commercial Managed Care - HMO | Admitting: Internal Medicine

## 2015-11-23 ENCOUNTER — Other Ambulatory Visit (HOSPITAL_BASED_OUTPATIENT_CLINIC_OR_DEPARTMENT_OTHER): Payer: Commercial Managed Care - HMO

## 2015-11-23 ENCOUNTER — Ambulatory Visit (HOSPITAL_BASED_OUTPATIENT_CLINIC_OR_DEPARTMENT_OTHER): Payer: Commercial Managed Care - HMO

## 2015-11-23 ENCOUNTER — Ambulatory Visit: Payer: Commercial Managed Care - HMO | Admitting: Nutrition

## 2015-11-23 VITALS — BP 175/86 | HR 116 | Temp 98.5°F

## 2015-11-23 DIAGNOSIS — C786 Secondary malignant neoplasm of retroperitoneum and peritoneum: Secondary | ICD-10-CM

## 2015-11-23 DIAGNOSIS — Z5111 Encounter for antineoplastic chemotherapy: Secondary | ICD-10-CM

## 2015-11-23 DIAGNOSIS — C541 Malignant neoplasm of endometrium: Secondary | ICD-10-CM

## 2015-11-23 DIAGNOSIS — C801 Malignant (primary) neoplasm, unspecified: Principal | ICD-10-CM

## 2015-11-23 LAB — CBC WITH DIFFERENTIAL/PLATELET
BASO%: 0 % (ref 0.0–2.0)
BASOS ABS: 0 10*3/uL (ref 0.0–0.1)
EOS ABS: 0 10*3/uL (ref 0.0–0.5)
EOS%: 0 % (ref 0.0–7.0)
HCT: 33.7 % — ABNORMAL LOW (ref 34.8–46.6)
HEMOGLOBIN: 10.9 g/dL — AB (ref 11.6–15.9)
LYMPH%: 12.6 % — ABNORMAL LOW (ref 14.0–49.7)
MCH: 30.5 pg (ref 25.1–34.0)
MCHC: 32.3 g/dL (ref 31.5–36.0)
MCV: 94.4 fL (ref 79.5–101.0)
MONO#: 0 10*3/uL — ABNORMAL LOW (ref 0.1–0.9)
MONO%: 0.6 % (ref 0.0–14.0)
NEUT#: 4.2 10*3/uL (ref 1.5–6.5)
NEUT%: 86.8 % — AB (ref 38.4–76.8)
Platelets: 272 10*3/uL (ref 145–400)
RBC: 3.57 10*6/uL — ABNORMAL LOW (ref 3.70–5.45)
RDW: 19.3 % — AB (ref 11.2–14.5)
WBC: 4.8 10*3/uL (ref 3.9–10.3)
lymph#: 0.6 10*3/uL — ABNORMAL LOW (ref 0.9–3.3)

## 2015-11-23 LAB — COMPREHENSIVE METABOLIC PANEL
ALBUMIN: 3.9 g/dL (ref 3.5–5.0)
ALK PHOS: 58 U/L (ref 40–150)
ALT: 14 U/L (ref 0–55)
AST: 26 U/L (ref 5–34)
Anion Gap: 14 mEq/L — ABNORMAL HIGH (ref 3–11)
BUN: 15.8 mg/dL (ref 7.0–26.0)
CALCIUM: 10 mg/dL (ref 8.4–10.4)
CO2: 21 mEq/L — ABNORMAL LOW (ref 22–29)
Chloride: 102 mEq/L (ref 98–109)
Creatinine: 1 mg/dL (ref 0.6–1.1)
EGFR: 66 mL/min/{1.73_m2} — AB (ref >90)
GLUCOSE: 244 mg/dL — AB (ref 70–140)
POTASSIUM: 3.9 meq/L (ref 3.5–5.1)
SODIUM: 137 meq/L (ref 136–145)
Total Bilirubin: 0.42 mg/dL (ref 0.20–1.20)
Total Protein: 8.9 g/dL — ABNORMAL HIGH (ref 6.4–8.3)

## 2015-11-23 MED ORDER — PACLITAXEL CHEMO INJECTION 300 MG/50ML
175.0000 mg/m2 | Freq: Once | INTRAVENOUS | Status: AC
  Administered 2015-11-23: 342 mg via INTRAVENOUS
  Filled 2015-11-23: qty 57

## 2015-11-23 MED ORDER — DIPHENHYDRAMINE HCL 50 MG/ML IJ SOLN
25.0000 mg | Freq: Once | INTRAMUSCULAR | Status: AC
  Administered 2015-11-23: 25 mg via INTRAVENOUS

## 2015-11-23 MED ORDER — SODIUM CHLORIDE 0.9 % IV SOLN
270.0000 mg | Freq: Once | INTRAVENOUS | Status: AC
  Administered 2015-11-23: 270 mg via INTRAVENOUS
  Filled 2015-11-23: qty 27

## 2015-11-23 MED ORDER — DIPHENHYDRAMINE HCL 50 MG/ML IJ SOLN
INTRAMUSCULAR | Status: AC
  Filled 2015-11-23: qty 1

## 2015-11-23 MED ORDER — FAMOTIDINE IN NACL 20-0.9 MG/50ML-% IV SOLN
20.0000 mg | Freq: Once | INTRAVENOUS | Status: AC
  Administered 2015-11-23: 20 mg via INTRAVENOUS

## 2015-11-23 MED ORDER — SODIUM CHLORIDE 0.9 % IV SOLN
Freq: Once | INTRAVENOUS | Status: AC
  Administered 2015-11-23: 12:00:00 via INTRAVENOUS
  Filled 2015-11-23: qty 8

## 2015-11-23 MED ORDER — SODIUM CHLORIDE 0.9 % IV SOLN
Freq: Once | INTRAVENOUS | Status: AC
  Administered 2015-11-23: 12:00:00 via INTRAVENOUS

## 2015-11-23 MED ORDER — FAMOTIDINE IN NACL 20-0.9 MG/50ML-% IV SOLN
INTRAVENOUS | Status: AC
  Filled 2015-11-23: qty 50

## 2015-11-23 NOTE — Progress Notes (Signed)
Nutrition follow-up completed with patient during infusion for endometrial cancer. Weight documented as 170 pounds January 26 decreased from 173 pounds January 16. Patient reports she eats well.  Her husband prepares food. Constipation has improved. Patient complains of taste alterations. She drinks nutrition shakes occasionally.  Nutrition diagnosis: Unintended weight loss continues.  Intervention:  Educated patient to continue strategies for adequate calories and protein intake. Recommended patient consume oral nutrition supplements daily. Teach back method was used.  Monitoring, evaluation, goals: Patient will tolerate adequate calories and protein to minimize weight loss.  Next visit: Tuesday, February 28, during infusion.  **Disclaimer: This note was dictated with voice recognition software. Similar sounding words can inadvertently be transcribed and this note may contain transcription errors which may not have been corrected upon publication of note.**

## 2015-11-23 NOTE — Patient Instructions (Signed)
[facility] Discharge Instructions for Patients Receiving Chemotherapy  Today you received the following chemotherapy agents Taxol and Carboplatin  To help prevent nausea and vomiting after your treatment, we encourage you to take your nausea medication     If you develop nausea and vomiting that is not controlled by your nausea medication, call the clinic.   BELOW ARE SYMPTOMS THAT SHOULD BE REPORTED IMMEDIATELY:  *FEVER GREATER THAN 100.5 F  *CHILLS WITH OR WITHOUT FEVER  NAUSEA AND VOMITING THAT IS NOT CONTROLLED WITH YOUR NAUSEA MEDICATION  *UNUSUAL SHORTNESS OF BREATH  *UNUSUAL BRUISING OR BLEEDING  TENDERNESS IN MOUTH AND THROAT WITH OR WITHOUT PRESENCE OF ULCERS  *URINARY PROBLEMS  *BOWEL PROBLEMS  UNUSUAL RASH Items with * indicate a potential emergency and should be followed up as soon as possible.  Feel free to call the clinic you have any questions or concerns. The clinic phone number is [phone].  Please show the CHEMO ALERT CARD at check-in to the Emergency Department and triage nurse.   

## 2015-11-25 ENCOUNTER — Ambulatory Visit (HOSPITAL_BASED_OUTPATIENT_CLINIC_OR_DEPARTMENT_OTHER): Payer: Commercial Managed Care - HMO

## 2015-11-25 VITALS — BP 163/85 | HR 98 | Temp 98.5°F

## 2015-11-25 DIAGNOSIS — C786 Secondary malignant neoplasm of retroperitoneum and peritoneum: Secondary | ICD-10-CM | POA: Diagnosis not present

## 2015-11-25 DIAGNOSIS — Z5189 Encounter for other specified aftercare: Secondary | ICD-10-CM | POA: Diagnosis not present

## 2015-11-25 DIAGNOSIS — C541 Malignant neoplasm of endometrium: Secondary | ICD-10-CM | POA: Diagnosis not present

## 2015-11-25 DIAGNOSIS — C801 Malignant (primary) neoplasm, unspecified: Secondary | ICD-10-CM

## 2015-11-25 MED ORDER — PEGFILGRASTIM INJECTION 6 MG/0.6ML ~~LOC~~
6.0000 mg | PREFILLED_SYRINGE | Freq: Once | SUBCUTANEOUS | Status: AC
  Administered 2015-11-25: 6 mg via SUBCUTANEOUS
  Filled 2015-11-25: qty 0.6

## 2015-12-05 ENCOUNTER — Other Ambulatory Visit: Payer: Self-pay | Admitting: Oncology

## 2015-12-05 DIAGNOSIS — C801 Malignant (primary) neoplasm, unspecified: Principal | ICD-10-CM

## 2015-12-05 DIAGNOSIS — C786 Secondary malignant neoplasm of retroperitoneum and peritoneum: Secondary | ICD-10-CM

## 2015-12-09 ENCOUNTER — Telehealth: Payer: Self-pay | Admitting: Oncology

## 2015-12-09 ENCOUNTER — Ambulatory Visit (HOSPITAL_BASED_OUTPATIENT_CLINIC_OR_DEPARTMENT_OTHER): Payer: Commercial Managed Care - HMO | Admitting: Oncology

## 2015-12-09 ENCOUNTER — Encounter: Payer: Self-pay | Admitting: Oncology

## 2015-12-09 ENCOUNTER — Other Ambulatory Visit (HOSPITAL_BASED_OUTPATIENT_CLINIC_OR_DEPARTMENT_OTHER): Payer: Commercial Managed Care - HMO

## 2015-12-09 VITALS — BP 130/81 | HR 85 | Temp 97.9°F | Resp 18 | Ht 66.0 in | Wt 165.2 lb

## 2015-12-09 DIAGNOSIS — C541 Malignant neoplasm of endometrium: Secondary | ICD-10-CM

## 2015-12-09 DIAGNOSIS — C786 Secondary malignant neoplasm of retroperitoneum and peritoneum: Secondary | ICD-10-CM

## 2015-12-09 DIAGNOSIS — N183 Chronic kidney disease, stage 3 (moderate): Secondary | ICD-10-CM

## 2015-12-09 DIAGNOSIS — R180 Malignant ascites: Secondary | ICD-10-CM | POA: Diagnosis not present

## 2015-12-09 DIAGNOSIS — D701 Agranulocytosis secondary to cancer chemotherapy: Secondary | ICD-10-CM | POA: Diagnosis not present

## 2015-12-09 DIAGNOSIS — C801 Malignant (primary) neoplasm, unspecified: Secondary | ICD-10-CM

## 2015-12-09 DIAGNOSIS — D631 Anemia in chronic kidney disease: Secondary | ICD-10-CM

## 2015-12-09 DIAGNOSIS — T451X5A Adverse effect of antineoplastic and immunosuppressive drugs, initial encounter: Secondary | ICD-10-CM

## 2015-12-09 LAB — COMPREHENSIVE METABOLIC PANEL
ALT: 15 U/L (ref 0–55)
ANION GAP: 9 meq/L (ref 3–11)
AST: 25 U/L (ref 5–34)
Albumin: 3.7 g/dL (ref 3.5–5.0)
Alkaline Phosphatase: 61 U/L (ref 40–150)
BILIRUBIN TOTAL: 0.37 mg/dL (ref 0.20–1.20)
BUN: 18.1 mg/dL (ref 7.0–26.0)
CHLORIDE: 106 meq/L (ref 98–109)
CO2: 26 meq/L (ref 22–29)
Calcium: 9.6 mg/dL (ref 8.4–10.4)
Creatinine: 0.9 mg/dL (ref 0.6–1.1)
EGFR: 77 mL/min/{1.73_m2} — AB (ref >90)
GLUCOSE: 109 mg/dL (ref 70–140)
POTASSIUM: 4.1 meq/L (ref 3.5–5.1)
SODIUM: 140 meq/L (ref 136–145)
TOTAL PROTEIN: 7.8 g/dL (ref 6.4–8.3)

## 2015-12-09 LAB — CBC WITH DIFFERENTIAL/PLATELET
BASO%: 0.5 % (ref 0.0–2.0)
BASOS ABS: 0 10*3/uL (ref 0.0–0.1)
EOS ABS: 0 10*3/uL (ref 0.0–0.5)
EOS%: 0.7 % (ref 0.0–7.0)
HCT: 30.9 % — ABNORMAL LOW (ref 34.8–46.6)
HGB: 10.2 g/dL — ABNORMAL LOW (ref 11.6–15.9)
LYMPH%: 35.1 % (ref 14.0–49.7)
MCH: 31.6 pg (ref 25.1–34.0)
MCHC: 33 g/dL (ref 31.5–36.0)
MCV: 95.7 fL (ref 79.5–101.0)
MONO#: 0.5 10*3/uL (ref 0.1–0.9)
MONO%: 15.2 % — AB (ref 0.0–14.0)
NEUT#: 1.8 10*3/uL (ref 1.5–6.5)
NEUT%: 48.5 % (ref 38.4–76.8)
PLATELETS: 199 10*3/uL (ref 145–400)
RBC: 3.22 10*6/uL — AB (ref 3.70–5.45)
RDW: 18.2 % — ABNORMAL HIGH (ref 11.2–14.5)
WBC: 3.6 10*3/uL — AB (ref 3.9–10.3)
lymph#: 1.3 10*3/uL (ref 0.9–3.3)

## 2015-12-09 NOTE — Telephone Encounter (Signed)
appt made and avs printed °

## 2015-12-09 NOTE — Progress Notes (Signed)
OFFICE PROGRESS NOTE   December 10, 2015   Physicians: W.Brewster/ Emma Rossi,Plotnikov, [name], MD, Gery Pray, [place] [facility] [name]), [name]  INTERVAL HISTORY:   Patient is seen, together with husband, as she continues chemotherapy for recurrent high grade endometrial carcinoma, with carcinomatosis and malignant ascites at presentation with recurrent disease. She had cycle 4 carbo taxol on 11-22-14, with neulasta at [facility] on 11-25-15.   Patient is tolerating chemotherapy well and feeling much better overall. Appetite is good including protein drink and Ensure daily in addition to regular diet. No nausea. Bowels are moving daily without medication. She has minimal tingling in fingertips only, not bothersome. Taxol aches for a few days after treatment "zinging pains + pain in knees", took ibuprofen x1 and did use claritin x 7 days after chemo.  Energy is much better, now driving, going to church and going out to eat. She denies SOB, pain, bleeding, fever or symptoms of infection. Peripheral IV access not difficult per patient. Sleeping well.  Usual weight prior to illness was 180 lbs, still losing weight despite better po intake may be reduction in ascites etc but will follow up with [facility] nutritionist again 12-14-15. Remainder of 10 point Review of Systems negative.    Flu vaccine 08-09-15 No central line No genetics testing  CA 125 09-14-15 2427  ONCOLOGIC HISTORY Patient has had regular medical care by PCP Dr Alain Marion and gyn Dr Garwin Brothers. Sometime after a fall in spring 2016, she noticed low pelvic discomfort and some intermittent vaginal spotting. She was seen for regular visit by Dr Garwin Brothers in May 2016, with PAP positive for AGUS and US showing endometrial stripe 8.56m. Endometrial biopsy 03-12-15 showed high grade adenocarcinoma with papillary features, however immunostains were not typical for serous carcinoma. She was seen by Dr RDenman Georgein June. CT CAP 04-14-15 showed no  evidence of metastatic disease in chest, a 3.3 x 2.8 cm endometrial mas and 3.1 cm cystic lesion of right ovary. She had robotic total laparoscopic hysterectomy BSO with bilateral pelvic and [facility] aortic [facility] Pathology [phone] grade 2 endometrioid adenocarcinoma invading 0.35 cm where myometrium 1.1 cm, 26 nodes negative and no LVSI. She had intracavitary radiation due to risk for vaginal cuff recurrence from 06-01-15 thru 07-01-15, vaginal cuff 27.5 gray in 5 fractions. There were no obvious concerns when she was seen by Dr RDenman Georgein late July or Dr KSondra Comeand Dr PAlain Marionin October. She presented to ED on 09-06-15 with generalized abdominal pain, bloating and constipation x 1 month. CT AP 09-06-15 showed extensive nodularity and stranding thruout omentum and large volume ascites, with liver/spleen/pancreas/adrenals not remarkable, no bowel obstruction. She was admitted x 24 hours, with UKoreaparacentesi on 09-07-15 for 5 liters of serous fluid, cytology (([id number] adenocarcinoma. SHe was much more comfortable after paracentesis and cannot tell that the fluid has reaccumulated as yet. She had consultation with Dr RDenman Georgeon 09-13-15, recommendation for systemic chemotherapy. Review of cytology from ascites compared with surgical specimen and [facility] high grade features that were seen on the initial endometrial biopsy (not seen on the full surgical path). She had repeat UKoreaparacentesis for 5.2 liters on 09-20-15. She had first carboplatin taxol on 09-21-15 and was neutropenic by day 8 cycle 1, granix begun. She had cycle 2 at same doses (carbo AUC = 3.5) 0n 10-13-15 with neulasta on 10-15-15 with neulasta on 12-30, and was  neutropenic on day 7.   Objective:  Vital signs in last 24 hours:  BP 130/81 mmHg  Pulse 85  Temp(Src) 97.9 F (36.6 C) (Oral)  Resp 18  Ht '5\' 6"'$  (1.676 m)  Wt 165 lb 3.2 oz  (74.934 kg)  BMI 26.68 kg/m2  SpO2 100% Weight down another 5 lbs.  Alert, oriented and appropriate. Ambulatory without assistance. Looks entirely comfortable. Alopecia  HEENT:PERRL, sclerae not icteric. Oral mucosa moist without lesions, posterior pharynx clear.  Neck supple. No JVD.  Lymphatics:no cervical,supraclavicular adenopathy Resp: clear to auscultation bilaterally and normal percussion bilaterally Cardio: regular rate and rhythm. No gallop. GI: soft, nontender, some fullness upper abdomen otherwise not obviously distended, no mass or organomegaly. Normally active bowel sounds.  Musculoskeletal/ Extremities: without pitting edema, cords, tenderness Neuro: minimal peripheral neuropathy as noted. Otherwise nonfocal. PSYCH appropriate mood and affect Skin without rash, ecchymosis, petechiae  Lab Results:  Results for orders placed or performed in visit on 12/09/15  CBC with Differential  Result Value Ref Range   WBC 3.6 (L) 3.9 - 10.3 10e3/uL   NEUT# 1.8 1.5 - 6.5 10e3/uL   HGB 10.2 (L) 11.6 - 15.9 g/dL   HCT 30.9 (L) 34.8 - 46.6 %   Platelets 199 145 - 400 10e3/uL   MCV 95.7 79.5 - 101.0 fL   MCH 31.6 25.1 - 34.0 pg   MCHC 33.0 31.5 - 36.0 g/dL   RBC 3.22 (L) 3.70 - 5.45 10e6/uL   RDW 18.2 (H) 11.2 - 14.5 %   lymph# 1.3 0.9 - 3.3 10e3/uL   MONO# 0.5 0.1 - 0.9 10e3/uL   Eosinophils Absolute 0.0 0.0 - 0.5 10e3/uL   Basophils Absolute 0.0 0.0 - 0.1 10e3/uL   NEUT% 48.5 38.4 - 76.8 %   LYMPH% 35.1 14.0 - 49.7 %   MONO% 15.2 (H) 0.0 - 14.0 %   EOS% 0.7 0.0 - 7.0 %   BASO% 0.5 0.0 - 2.0 %  Comprehensive metabolic panel  Result Value Ref Range   Sodium 140 136 - 145 mEq/L   Potassium 4.1 3.5 - 5.1 mEq/L   Chloride 106 98 - 109 mEq/L   CO2 26 22 - 29 mEq/L   Glucose 109 70 - 140 mg/dl   BUN 18.1 7.0 - 26.0 mg/dL   Creatinine 0.9 0.6 - 1.1 mg/dL   Total Bilirubin 0.37 0.20 - 1.20 mg/dL   Alkaline Phosphatase 61 40 - 150 U/L   AST 25 5 - 34 U/L   ALT 15 0 - 55 U/L    Total Protein 7.8 6.4 - 8.3 g/dL   Albumin 3.7 3.5 - 5.0 g/dL   Calcium 9.6 8.4 - 10.4 mg/dL   Anion Gap 9 3 - 11 mEq/L   EGFR 77 (L) >90 ml/min/1.73 m2  CA 125  Result Value Ref Range   Cancer Antigen (CA) 125 649.1 (H) 0.0 - 38.1 U/mL  CA 125 (Parallel Testing)  Result Value Ref Range   CA 125 866 (H) <35 U/mL   CA 125 available after visit as above, down from 867 on 11-01-15 (1148 on parallel testing that day)  Studies/Results:  No results found.  Medications: I have reviewed the patient's current medications.  DISCUSSION  Clinically improving with chemotherapy in process, which she and husband are in favor of continuing as planned. I have told them that, if she is continuing to improve including by CA 125 marker and is tolerating treatment well,  may benefit from additional cycles of [place]  taxol beyond #6. Consider repeat scans after 6 cycles or possibly additional cycles prior to repeat scans  I have told patient and husband again that we do not expect to cure this recurrent disease entirely, but hopefully can continue to improve and control the disease for some time.  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 Massive ascites, with 5 liters removed 09-07-15 , 5.2 liters on 09-20-15, and 1.6 liters on 09-28-15. Chemotherapy begun with carbo taxol 09-21-15, Tolerating present doses with neulasta support . Clinically improving, continue chemo and close follow up. 2. Chemo neutropenia by day 8 cycle 1, and neutropenic day 7 cycle 2 despite neulaasta day 3 cycle 2 (and dose reduced [place]). Previous pelvic RT likely impacting counts. WIll keep carbo dose same as cycle 3.  Note neulasta best given be given at [facility] as some noncompliance and comprehension concerns.  3.Admission for  tachycardia after cycle 1, HR sustained 150-160, no other findings and resolved when beta blocker was resumed, this DCd in error by patient then. Tachycardia improved, seems to be taking meds correctly. Dr Alain Marion aware. 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 No adjuvant chemotherapy with those surgical findings. Initial biopsy specimen only, not surgical path, had a high grade component [facility] path), which is identical to this recurrent disease. 6.constipation resolved.  7.renal insufficiency: CKD 3. Ascites clinically improved and now lwell hydrated. No hydronephrosis. Did tolerate CTA chest contrast recent hospital admission, but would be cautious about IV contrast. Dosing carbo with AUC. 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 Managed by Dr Alain Marion 10.post cholecystectomy and up to date colonoscopy and mammograms 11. Poor nutritional status: po intake improved, still losing weight which may be improvement in ascites and tumor. 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 Records requested from archives   All questions answered. Chemo and neulasta orders confirmed. Time spent 25 min including >50% counseling and coordination of care.    Gordy Levan, MD   12/10/2015, 9:27 PM

## 2015-12-10 LAB — CA 125: CANCER ANTIGEN (CA) 125: 649.1 U/mL — AB (ref 0.0–38.1)

## 2015-12-10 LAB — CANCER ANTIGEN 125 (PARALLEL TESTING): CA 125: 866 U/mL — AB (ref <35)

## 2015-12-14 ENCOUNTER — Other Ambulatory Visit (HOSPITAL_BASED_OUTPATIENT_CLINIC_OR_DEPARTMENT_OTHER): Payer: Commercial Managed Care - HMO

## 2015-12-14 ENCOUNTER — Ambulatory Visit: Payer: Commercial Managed Care - HMO | Admitting: Nutrition

## 2015-12-14 ENCOUNTER — Ambulatory Visit (HOSPITAL_BASED_OUTPATIENT_CLINIC_OR_DEPARTMENT_OTHER): Payer: Commercial Managed Care - HMO

## 2015-12-14 VITALS — BP 191/92 | HR 108 | Temp 98.8°F | Resp 18

## 2015-12-14 DIAGNOSIS — C801 Malignant (primary) neoplasm, unspecified: Principal | ICD-10-CM

## 2015-12-14 DIAGNOSIS — C541 Malignant neoplasm of endometrium: Secondary | ICD-10-CM

## 2015-12-14 DIAGNOSIS — Z5111 Encounter for antineoplastic chemotherapy: Secondary | ICD-10-CM | POA: Diagnosis not present

## 2015-12-14 DIAGNOSIS — C786 Secondary malignant neoplasm of retroperitoneum and peritoneum: Secondary | ICD-10-CM | POA: Diagnosis not present

## 2015-12-14 LAB — COMPREHENSIVE METABOLIC PANEL
ALBUMIN: 3.9 g/dL (ref 3.5–5.0)
ALK PHOS: 62 U/L (ref 40–150)
ALT: 15 U/L (ref 0–55)
ANION GAP: 11 meq/L (ref 3–11)
AST: 24 U/L (ref 5–34)
BUN: 16.4 mg/dL (ref 7.0–26.0)
CALCIUM: 9.8 mg/dL (ref 8.4–10.4)
CO2: 21 meq/L — AB (ref 22–29)
Chloride: 105 mEq/L (ref 98–109)
Creatinine: 1 mg/dL (ref 0.6–1.1)
EGFR: 68 mL/min/{1.73_m2} — ABNORMAL LOW (ref >90)
GLUCOSE: 179 mg/dL — AB (ref 70–140)
Potassium: 4.1 mEq/L (ref 3.5–5.1)
Sodium: 137 mEq/L (ref 136–145)
TOTAL PROTEIN: 8.5 g/dL — AB (ref 6.4–8.3)

## 2015-12-14 LAB — CBC WITH DIFFERENTIAL/PLATELET
BASO%: 0.1 % (ref 0.0–2.0)
Basophils Absolute: 0 10*3/uL (ref 0.0–0.1)
EOS ABS: 0 10*3/uL (ref 0.0–0.5)
EOS%: 0 % (ref 0.0–7.0)
HEMATOCRIT: 33.7 % — AB (ref 34.8–46.6)
HEMOGLOBIN: 11 g/dL — AB (ref 11.6–15.9)
LYMPH#: 0.3 10*3/uL — AB (ref 0.9–3.3)
LYMPH%: 8.6 % — ABNORMAL LOW (ref 14.0–49.7)
MCH: 31.7 pg (ref 25.1–34.0)
MCHC: 32.7 g/dL (ref 31.5–36.0)
MCV: 96.8 fL (ref 79.5–101.0)
MONO#: 0 10*3/uL — AB (ref 0.1–0.9)
MONO%: 0.7 % (ref 0.0–14.0)
NEUT%: 90.6 % — ABNORMAL HIGH (ref 38.4–76.8)
NEUTROS ABS: 3.6 10*3/uL (ref 1.5–6.5)
PLATELETS: 224 10*3/uL (ref 145–400)
RBC: 3.48 10*6/uL — AB (ref 3.70–5.45)
RDW: 17.8 % — AB (ref 11.2–14.5)
WBC: 3.9 10*3/uL (ref 3.9–10.3)

## 2015-12-14 MED ORDER — SODIUM CHLORIDE 0.9 % IV SOLN
Freq: Once | INTRAVENOUS | Status: AC
  Administered 2015-12-14: 12:00:00 via INTRAVENOUS
  Filled 2015-12-14: qty 8

## 2015-12-14 MED ORDER — FAMOTIDINE IN NACL 20-0.9 MG/50ML-% IV SOLN
20.0000 mg | Freq: Once | INTRAVENOUS | Status: AC
  Administered 2015-12-14: 20 mg via INTRAVENOUS

## 2015-12-14 MED ORDER — DIPHENHYDRAMINE HCL 50 MG/ML IJ SOLN
INTRAMUSCULAR | Status: AC
  Filled 2015-12-14: qty 1

## 2015-12-14 MED ORDER — SODIUM CHLORIDE 0.9 % IV SOLN
270.0000 mg | Freq: Once | INTRAVENOUS | Status: AC
  Administered 2015-12-14: 270 mg via INTRAVENOUS
  Filled 2015-12-14: qty 27

## 2015-12-14 MED ORDER — FAMOTIDINE IN NACL 20-0.9 MG/50ML-% IV SOLN
INTRAVENOUS | Status: AC
  Filled 2015-12-14: qty 50

## 2015-12-14 MED ORDER — PACLITAXEL CHEMO INJECTION 300 MG/50ML
175.0000 mg/m2 | Freq: Once | INTRAVENOUS | Status: AC
  Administered 2015-12-14: 342 mg via INTRAVENOUS
  Filled 2015-12-14: qty 57

## 2015-12-14 MED ORDER — DIPHENHYDRAMINE HCL 50 MG/ML IJ SOLN
25.0000 mg | Freq: Once | INTRAMUSCULAR | Status: AC
  Administered 2015-12-14: 25 mg via INTRAVENOUS

## 2015-12-14 MED ORDER — SODIUM CHLORIDE 0.9 % IV SOLN
Freq: Once | INTRAVENOUS | Status: AC
  Administered 2015-12-14: 12:00:00 via INTRAVENOUS

## 2015-12-14 NOTE — Progress Notes (Signed)
Nutrition follow-up completed with patient during infusion for endometrial cancer. Weight continues to decrease and was documented as 165.2 pounds February 23, down from 170 pounds January 26. Patient states she is eating well.  However, it appears she eats 2 meals daily and will occasionally drink one oral nutrition supplement. It is likely she still has inadequate oral intake. Patient denies nausea and vomiting. Reports she enjoys regular supplements but does not like the "Plus" supplements.  Nutrition diagnosis: Unintended weight loss continues.  Intervention:  Educated patient to continue breakfast with her protein shake and dinner daily. Recommended patient add 2-3 snacks between breakfast and dinner. Recommended patient increase boost twice a day to 3 times a day Patient states understanding. Teach back method used.  Monitoring, evaluation, goals: Patient will tolerate adequate calories and protein to minimize further weight loss.  Next visit: Tuesday, March 21 during infusion.  **Disclaimer: This note was dictated with voice recognition software. Similar sounding words can inadvertently be transcribed and this note may contain transcription errors which may not have been corrected upon publication of note.**

## 2015-12-14 NOTE — Patient Instructions (Signed)
[facility] Discharge Instructions for Patients Receiving Chemotherapy  Today you received the following chemotherapy agents:  Taxol and Carboplatin  To help prevent nausea and vomiting after your treatment, we encourage you to take your nausea medication as ordered per MD.   If you develop nausea and vomiting that is not controlled by your nausea medication, call the clinic.   BELOW ARE SYMPTOMS THAT SHOULD BE REPORTED IMMEDIATELY:  *FEVER GREATER THAN 100.5 F  *CHILLS WITH OR WITHOUT FEVER  NAUSEA AND VOMITING THAT IS NOT CONTROLLED WITH YOUR NAUSEA MEDICATION  *UNUSUAL SHORTNESS OF BREATH  *UNUSUAL BRUISING OR BLEEDING  TENDERNESS IN MOUTH AND THROAT WITH OR WITHOUT PRESENCE OF ULCERS  *URINARY PROBLEMS  *BOWEL PROBLEMS  UNUSUAL RASH Items with * indicate a potential emergency and should be followed up as soon as possible.  Feel free to call the clinic you have any questions or concerns. The clinic phone number is [phone].  Please show the CHEMO ALERT CARD at check-in to the Emergency Department and triage nurse.   

## 2015-12-16 ENCOUNTER — Ambulatory Visit (HOSPITAL_BASED_OUTPATIENT_CLINIC_OR_DEPARTMENT_OTHER): Payer: Commercial Managed Care - HMO

## 2015-12-16 VITALS — BP 173/81 | HR 85 | Temp 98.2°F

## 2015-12-16 DIAGNOSIS — C541 Malignant neoplasm of endometrium: Secondary | ICD-10-CM | POA: Diagnosis not present

## 2015-12-16 DIAGNOSIS — C786 Secondary malignant neoplasm of retroperitoneum and peritoneum: Secondary | ICD-10-CM

## 2015-12-16 DIAGNOSIS — C801 Malignant (primary) neoplasm, unspecified: Secondary | ICD-10-CM

## 2015-12-16 DIAGNOSIS — Z5189 Encounter for other specified aftercare: Secondary | ICD-10-CM

## 2015-12-16 MED ORDER — PEGFILGRASTIM INJECTION 6 MG/0.6ML ~~LOC~~
6.0000 mg | PREFILLED_SYRINGE | Freq: Once | SUBCUTANEOUS | Status: AC
  Administered 2015-12-16: 6 mg via SUBCUTANEOUS
  Filled 2015-12-16: qty 0.6

## 2015-12-24 ENCOUNTER — Other Ambulatory Visit: Payer: Self-pay

## 2015-12-24 DIAGNOSIS — C786 Secondary malignant neoplasm of retroperitoneum and peritoneum: Secondary | ICD-10-CM

## 2015-12-24 DIAGNOSIS — C801 Malignant (primary) neoplasm, unspecified: Secondary | ICD-10-CM

## 2015-12-24 DIAGNOSIS — C541 Malignant neoplasm of endometrium: Secondary | ICD-10-CM

## 2015-12-24 DIAGNOSIS — K219 Gastro-esophageal reflux disease without esophagitis: Secondary | ICD-10-CM

## 2015-12-24 MED ORDER — LORAZEPAM 0.5 MG PO TABS: ORAL_TABLET | ORAL | Status: DC

## 2015-12-26 ENCOUNTER — Other Ambulatory Visit: Payer: Self-pay | Admitting: Oncology

## 2015-12-26 DIAGNOSIS — C786 Secondary malignant neoplasm of retroperitoneum and peritoneum: Secondary | ICD-10-CM

## 2015-12-26 DIAGNOSIS — C801 Malignant (primary) neoplasm, unspecified: Principal | ICD-10-CM

## 2015-12-26 DIAGNOSIS — C541 Malignant neoplasm of endometrium: Secondary | ICD-10-CM

## 2015-12-30 ENCOUNTER — Ambulatory Visit (HOSPITAL_BASED_OUTPATIENT_CLINIC_OR_DEPARTMENT_OTHER): Payer: Commercial Managed Care - HMO | Admitting: Oncology

## 2015-12-30 ENCOUNTER — Encounter: Payer: Self-pay | Admitting: Oncology

## 2015-12-30 ENCOUNTER — Ambulatory Visit
Admission: RE | Admit: 2015-12-30 | Discharge: 2015-12-30 | Disposition: A | Discharge status: Home / Self Care | Payer: Commercial Managed Care - HMO | Source: Ambulatory Visit | Attending: Radiation Oncology | Admitting: Radiation Oncology

## 2015-12-30 ENCOUNTER — Other Ambulatory Visit (HOSPITAL_BASED_OUTPATIENT_CLINIC_OR_DEPARTMENT_OTHER): Payer: Commercial Managed Care - HMO

## 2015-12-30 ENCOUNTER — Telehealth: Payer: Self-pay | Admitting: Oncology

## 2015-12-30 VITALS — BP 188/79 | HR 72 | Temp 98.1°F | Resp 16 | Ht 66.0 in | Wt 164.6 lb

## 2015-12-30 VITALS — BP 178/86 | HR 81 | Temp 97.4°F | Resp 18 | Ht 66.0 in | Wt 163.9 lb

## 2015-12-30 DIAGNOSIS — D701 Agranulocytosis secondary to cancer chemotherapy: Secondary | ICD-10-CM

## 2015-12-30 DIAGNOSIS — C541 Malignant neoplasm of endometrium: Secondary | ICD-10-CM

## 2015-12-30 DIAGNOSIS — C801 Malignant (primary) neoplasm, unspecified: Principal | ICD-10-CM

## 2015-12-30 DIAGNOSIS — E46 Unspecified protein-calorie malnutrition: Secondary | ICD-10-CM | POA: Diagnosis not present

## 2015-12-30 DIAGNOSIS — N189 Chronic kidney disease, unspecified: Secondary | ICD-10-CM

## 2015-12-30 DIAGNOSIS — C211 Malignant neoplasm of anal canal: Secondary | ICD-10-CM

## 2015-12-30 DIAGNOSIS — D6481 Anemia due to antineoplastic chemotherapy: Secondary | ICD-10-CM | POA: Diagnosis not present

## 2015-12-30 DIAGNOSIS — T451X5A Adverse effect of antineoplastic and immunosuppressive drugs, initial encounter: Secondary | ICD-10-CM

## 2015-12-30 DIAGNOSIS — C786 Secondary malignant neoplasm of retroperitoneum and peritoneum: Secondary | ICD-10-CM

## 2015-12-30 DIAGNOSIS — D631 Anemia in chronic kidney disease: Secondary | ICD-10-CM

## 2015-12-30 LAB — CBC WITH DIFFERENTIAL/PLATELET
BASO%: 0.2 % (ref 0.0–2.0)
Basophils Absolute: 0 10*3/uL (ref 0.0–0.1)
EOS ABS: 0 10*3/uL (ref 0.0–0.5)
EOS%: 0.9 % (ref 0.0–7.0)
HCT: 33.2 % — ABNORMAL LOW (ref 34.8–46.6)
HEMOGLOBIN: 10.7 g/dL — AB (ref 11.6–15.9)
LYMPH%: 25.1 % (ref 14.0–49.7)
MCH: 31.6 pg (ref 25.1–34.0)
MCHC: 32.2 g/dL (ref 31.5–36.0)
MCV: 97.9 fL (ref 79.5–101.0)
MONO#: 0.6 10*3/uL (ref 0.1–0.9)
MONO%: 12.9 % (ref 0.0–14.0)
NEUT%: 60.9 % (ref 38.4–76.8)
NEUTROS ABS: 2.6 10*3/uL (ref 1.5–6.5)
Platelets: 194 10*3/uL (ref 145–400)
RBC: 3.39 10*6/uL — ABNORMAL LOW (ref 3.70–5.45)
RDW: 15 % — AB (ref 11.2–14.5)
WBC: 4.3 10*3/uL (ref 3.9–10.3)
lymph#: 1.1 10*3/uL (ref 0.9–3.3)

## 2015-12-30 LAB — COMPREHENSIVE METABOLIC PANEL
ALBUMIN: 3.8 g/dL (ref 3.5–5.0)
ALK PHOS: 56 U/L (ref 40–150)
ALT: 17 U/L (ref 0–55)
AST: 26 U/L (ref 5–34)
Anion Gap: 7 mEq/L (ref 3–11)
BUN: 19.5 mg/dL (ref 7.0–26.0)
CALCIUM: 9.7 mg/dL (ref 8.4–10.4)
CHLORIDE: 106 meq/L (ref 98–109)
CO2: 27 mEq/L (ref 22–29)
CREATININE: 0.9 mg/dL (ref 0.6–1.1)
EGFR: 69 mL/min/{1.73_m2} — ABNORMAL LOW (ref >90)
Glucose: 134 mg/dl (ref 70–140)
Potassium: 4.3 mEq/L (ref 3.5–5.1)
Sodium: 139 mEq/L (ref 136–145)
Total Bilirubin: <0.3 mg/dL (ref 0.20–1.20)
Total Protein: 7.7 g/dL (ref 6.4–8.3)

## 2015-12-30 MED ORDER — DEXAMETHASONE 4 MG PO TABS: ORAL_TABLET | ORAL | Status: DC

## 2015-12-30 NOTE — Progress Notes (Signed)
OFFICE PROGRESS NOTE   December 30, 2015   Physicians: W.Brewster/ Emma Rossi,Plotnikov, [name], MD, Gery Pray, [place] [facility] [name]), [name]  INTERVAL HISTORY:  Patient is seen, together with husband, in continuing attention to chemotherapy in process for recurrent high grade endometrial carcinoma, which presented with carcinomatosis and malignant ascites in 08-2015. She has clinically improved significantly with chemotherapy, having had cycle 5 carbo taxol on 12-14-15 with neulasta on 12-16-15. Cytopenias have been dose limiting for the carboplatin, even with gCSF, likely due to past treatment for anal cancer. Last imaging was CT AP 09-06-15 and CT chest 09-27-15.  CA 125, which had been 2427 on 09-14-15, was down to 649 on 12-09-15.   Patient continues to tolerate chemotherapy generally well with present dosing. She has some fatigue, some discomfort in lower abdomen intermittently, bowels ok, bladder ok. Appetite is fair, however she continues to lose weight and does not seem to have increased snacks or supplements as suggested by dietician, drinks Ensure sometimes once daily. She does not have marked taxol or neulasta aches. She denies bothersome peripheral neuropathy. Peripheral IV access still seems adequate. She is fairly inactive at home, just occasional trips out as to grocery store but no regular exercise, and I have encouraged her to try to increase walking. No fever or symptoms of infection. Able to sleep. No LE swelling. No bleeding. No SOB or cough.  Remainder of 10 point Review of Systems negative.   Flu vaccine 08-09-15 No central line No genetics testing  CA 125 09-14-15 2427  [place] nutritionist to follow up on 01-04-16  ONCOLOGIC HISTORY Patient has had regular medical care by PCP Dr Alain Marion and gyn Dr Garwin Brothers. Sometime after a fall in spring 2016, she noticed low pelvic discomfort and some intermittent vaginal spotting. She was seen for regular visit by Dr  Garwin Brothers in May 2016, with PAP positive for AGUS and US showing endometrial stripe 8.8m. Endometrial biopsy 03-12-15 showed high grade adenocarcinoma with papillary features, however immunostains were not typical for serous carcinoma. She was seen by Dr RDenman Georgein June. CT CAP 04-14-15 showed no evidence of metastatic disease in chest, a 3.3 x 2.8 cm endometrial mas and 3.1 cm cystic lesion of right ovary. She had robotic total laparoscopic hysterectomy BSO with bilateral pelvic and para aortic node [facility] Pathology ([phone] grade 2 endometrioid adenocarcinoma invading 0.35 cm where myometrium 1.1 cm, 26 nodes negative and no LVSI. She had intracavitary radiation due to risk for vaginal cuff recurrence from 06-01-15 thru 07-01-15, vaginal cuff 27.5 gray in 5 fractions. There were no obvious concerns when she was seen by Dr RDenman Georgein late July or Dr KSondra Comeand Dr PAlain Marionin October. She presented to ED on 09-06-15 with generalized abdominal pain, bloating and constipation x 1 month. CT AP 09-06-15 showed extensive nodularity and stranding thruout omentum and large volume ascites, with liver/spleen/pancreas/adrenals not remarkable, no bowel obstruction. She was admitted x 24 hours, with UKoreaparacentesi on 09-07-15 for 5 liters of serous fluid, cytology (([id number] adenocarcinoma. SHe was much more comfortable after paracentesis and cannot tell that the fluid has reaccumulated as yet. She had consultation with Dr RDenman Georgeon 09-13-15, recommendation for systemic chemotherapy. Review of cytology from ascites compared with surgical specimen and [facility] high grade features that were seen on the initial endometrial biopsy (not seen on the full surgical path). She had repeat UKoreaparacentesis for  5.2 liters on 09-20-15. She had first carboplatin taxol on 09-21-15 and was neutropenic by day 8 cycle 1, granix begun. She had  cycle 2 at same doses (carbo AUC = 3.5) 0n 10-13-15 with neulasta on 10-15-15 with neulasta on 12-30, and was neutropenic on day 7.   Objective:  Vital signs in last 24 hours:  BP 178/86 mmHg  Pulse 81  Temp(Src) 97.4 F (36.3 C) (Oral)  Resp 18  Ht '5\' 6"'$  (1.676 m)  Wt 163 lb 14.4 oz (74.345 kg)  BMI 26.47 kg/m2  SpO2 99% Weight down 2 lbs Alert, oriented and appropriate. Ambulatory without difficulty.  Alopecia  HEENT:PERRL, sclerae not icteric. Oral mucosa moist without lesions, posterior pharynx clear.  Neck supple. No JVD.  Lymphatics:no cervical,supraclavicular or inguinal adenopathy Resp: clear to auscultation bilaterally and normal percussion bilaterally Cardio: regular rate and rhythm. No gallop. GI: soft, nontender, minimally distended, no clear mass or organomegaly, no obvious increasing ascites. Normally active bowel sounds. Surgical incisions not remarkable. Musculoskeletal/ Extremities: without pitting edema, cords, tenderness Neuro: no peripheral neuropathy. Otherwise nonfocal. PSYCH appropriate mood and affect Skin without rash, ecchymosis, petechiae  Lab Results:  Results for orders placed or performed in visit on 12/30/15  CBC with Differential  Result Value Ref Range   WBC 4.3 3.9 - 10.3 10e3/uL   NEUT# 2.6 1.5 - 6.5 10e3/uL   HGB 10.7 (L) 11.6 - 15.9 g/dL   HCT 33.2 (L) 34.8 - 46.6 %   Platelets 194 145 - 400 10e3/uL   MCV 97.9 79.5 - 101.0 fL   MCH 31.6 25.1 - 34.0 pg   MCHC 32.2 31.5 - 36.0 g/dL   RBC 3.39 (L) 3.70 - 5.45 10e6/uL   RDW 15.0 (H) 11.2 - 14.5 %   lymph# 1.1 0.9 - 3.3 10e3/uL   MONO# 0.6 0.1 - 0.9 10e3/uL   Eosinophils Absolute 0.0 0.0 - 0.5 10e3/uL   Basophils Absolute 0.0 0.0 - 0.1 10e3/uL   NEUT% 60.9 38.4 - 76.8 %   LYMPH% 25.1 14.0 - 49.7 %   MONO% 12.9 0.0 - 14.0 %   EOS% 0.9 0.0 - 7.0 %   BASO% 0.2 0.0 - 2.0 %  Comprehensive metabolic panel  Result Value Ref Range   Sodium 139 136 - 145 mEq/L   Potassium 4.3 3.5 - 5.1  mEq/L   Chloride 106 98 - 109 mEq/L   CO2 27 22 - 29 mEq/L   Glucose 134 70 - 140 mg/dl   BUN 19.5 7.0 - 26.0 mg/dL   Creatinine 0.9 0.6 - 1.1 mg/dL   Total Bilirubin <0.30 0.20 - 1.20 mg/dL   Alkaline Phosphatase 56 40 - 150 U/L   AST 26 5 - 34 U/L   ALT 17 0 - 55 U/L   Total Protein 7.7 6.4 - 8.3 g/dL   Albumin 3.8 3.5 - 5.0 g/dL   Calcium 9.7 8.4 - 10.4 mg/dL   Anion Gap 7 3 - 11 mEq/L   EGFR 69 (L) >90 ml/min/1.73 m2    Will repeat CA 125 with labs on 01-04-16  Studies/Results:  No results found.  Medications: I have reviewed the patient's current medications.  DISCUSSION Nutrition and activity/ exercise encouraged. Will get CT after cycle 6, may be appropriate to continue this chemotherapy if still improving. Patient and husband understand that treatment is in attempt to control the disease, and are in agreement with continuing with next cycle as planned.  Assessment/Plan:  1.Abdominal carcinomatosis from high grade component  of endometrial carcinoma:  Massive ascites, with 5 liters removed 09-07-15 , 5.2 liters on 09-20-15, and 1.6 liters on 09-28-15. Chemotherapy begun with carbo taxol 09-21-15, Tolerating present doses with neulasta support . Clinically improving, for cycle 6 on 3-21 as long as ANC >=1.5 and platelets >=100k, then repeat CTs. 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 No adjuvant chemotherapy with those surgical findings. Initial biopsy specimen only, not surgical path, had a high grade component [facility] path), which is identical to this recurrent disease.  2. Chemo neutropenia by day 8 cycle 1, and neutropenic day 7 cycle 2 despite neulaasta day 3 cycle 2 (and dose reduced [place]). Previous pelvic RT likely impacting counts. WIll keep carbo dose same as cycle 3.  Note [facility]asta [facility] be [facility] at [facility]  3.Admission for tachycardia after  cycle 1, HR sustained 150-160, no other findings and resolved when beta blocker was resumed, this DCd in error by patient. 4.chemo anemia, anemia of CKD and iron deficiency anemia: Continue oral iron and follow, fairly stable now 5.renal insufficiency improved with resolution of the ascites, creatinine now 0.9.  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 Managed by Dr Alain Marion 8.post cholecystectomy and up to date colonoscopy and mammograms 9. Poor nutritional status: po intake improved, still losing weight, which may be improvement in ascites and tumor, but po intake still does not seem optimal. Discussed again now and Drew to follow up day of chemo 10.squamous cell carcinoma anus 2003: radiation and may have had chemo then, may be impacting counts with present chemo.   All questions answered. Chemo and neulasta orders confirmed. Time spent 25 min including >50% counseling and coordination of care. Update Drs Alain Marion and Skeet Latch with this note   LIVESAY,LENNIS P, MD   12/30/2015, 10:08 AM

## 2015-12-30 NOTE — Telephone Encounter (Signed)
appt made and avs printed. Pt received barium for CT to be schedule by central radiology

## 2015-12-30 NOTE — Progress Notes (Signed)
Sharon Holt here for follow up. She denies having any bladder or bowel issues.  She denies having vaginal/rectal bleeding or discharge.  She denies having fatigue.  She will have her next round of chemotherapy on Tuesday.  She reports having a good appetite and denies having nausea.  She has stopped using her vaginal dilator.  BP 188/79 mmHg  Pulse 72  Temp(Src) 98.1 F (36.7 C) (Oral)  Resp 16  Ht 5\' 6"  (1.676 m)  Wt 164 lb 9.6 oz (74.662 kg)  BMI 26.58 kg/m2   Wt Readings from Last 3 Encounters:  12/30/15 164 lb 9.6 oz (74.662 kg)  12/30/15 163 lb 14.4 oz (74.345 kg)  12/09/15 165 lb 3.2 oz (74.934 kg)

## 2015-12-30 NOTE — Progress Notes (Signed)
Radiation Oncology         [phone] ________________________________  Name: Sharon Holt MRN: [id number]  Date: 12/30/2015  DOB: 28-May-1941  Follow-Up Visit Note  CC: Sharon Kehr, MD  Everitt Amber, MD   Diagnosis: Endometrial cancer. (Stage IA Grade 2) , now with carcinomatosis and malignant ascites   Interval Since Last Radiation:  6  months, 06/01/15- 07/01/15  Indication for treatment:  Risk for vaginal cuff recurrence       Radiation treatment dates: 06/01/2015, 06/10/2015, 06/17/2015, 9/82016, 07/01/2015  Site/dose: Vaginal cuff 27.5 gray in 5 fractions (5.5 gray per fraction)  Narrative:   Sharon Holt is here for follow up.She denies having any bladder or bowel issues. She denies having vaginal/rectal bleeding or discharge. Denies vaginal drainage or urination difficulty. She denies having fatigue. She will have her next round of chemotherapy on Tuesday, 01/04/2016. This will be her 6th cycle. She reports having a good appetite and denies having nausea. She has stopped using her vaginal dilator. She feels that abdominal swelling has decreased. She has had to have fluid drawn off around 3 times. Reports that she sometimes experiences pain in the lower abdomen. She is tender during physical exam. She will be having a CT scan done soon to check on teh progress of her chemotherapy treatment.             ALLERGIES:  has No Known Allergies.  Meds: Current Outpatient Prescriptions  Medication Sig Dispense Refill  . atenolol (TENORMIN) 25 MG tablet Take 3 tablets (75 mg total) by mouth daily. 270 tablet 3  . Cholecalciferol (VITAMIN D3) 2000 UNITS capsule Take 2,000 Units by mouth daily.     . clorazepate (TRANXENE) 7.5 MG tablet Take 1 tablet (7.5 mg total) by mouth 2 (two) times daily as needed for anxiety. 180 tablet 1  . dexamethasone (DECADRON) 4 MG tablet Take 5 tablets by mouth with food 12 hours and 6 hours prior to taxol chemotherapy. 10 tablet 0  . feeding  supplement, ENSURE ENLIVE, (ENSURE ENLIVE) LIQD Take 237 mLs by mouth 2 (two) times daily between meals. (Patient taking differently: Take 237 mLs by mouth daily as needed. ) 237 mL 12  . FERROCITE 324 MG TABS Take 1 tablet by mouth daily.  0  . Multiple Vitamins-Minerals (CENTRUM SILVER PO) Take 1 capsule by mouth daily.     . polyethylene glycol (MIRALAX / GLYCOLAX) packet Take 17 g by mouth daily. Reported on 12/09/2015    . potassium chloride SA (K-DUR,KLOR-CON) 20 MEQ tablet Take 1 tablet (20 mEq total) by mouth daily. 90 tablet 3  . LORazepam (ATIVAN) 0.5 MG tablet Place 1 tablet under the tongue or swallow every 6 hours as needed for nausea. (Patient not taking: Reported on 12/30/2015) 30 tablet 0  . ondansetron (ZOFRAN) 8 MG tablet Take 0.5-1 tablets (4-8 mg total) by mouth every 8 (eight) hours as needed for nausea, vomiting or refractory nausea / vomiting. Will NOT make drowsy. (Patient not taking: Reported on 12/09/2015) 30 tablet 2  . pantoprazole (PROTONIX) 40 MG tablet Take 1 tablet (40 mg total) by mouth daily. For acid reflux (Patient not taking: Reported on 12/30/2015) 30 tablet 2   No current facility-administered medications for this encounter.    Physical Findings: The patient is in no acute distress. Patient is alert and oriented.  height is 5\' 6"  (1.676 m) and weight is 164 lb 9.6 oz (74.662 kg). Her oral temperature is 98.1 F (36.7 C). Her blood  pressure is 188/79 and her pulse is 72. Her respiration is 16.   No palpable cervical, supraclavicular or axillary lymphadenopathy. The heart has a regular rate and rhythm. The lungs are clear to auscultation.  No obvious fluid wave. Bowel sounds normal. No rebound or guarding. Pelvic exam not performed in light of current picture as above   Lab Findings: Lab Results  Component Value Date   WBC 4.3 12/30/2015   HGB 10.7* 12/30/2015   HCT 33.2* 12/30/2015   MCV 97.9 12/30/2015   PLT 194 12/30/2015    Radiographic  Findings: No results found.  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 The patient continues on systemic chemotherapy  Plan: I will see the patient PRN in light of her close follow-up with medical oncology.  -----------------------------------  Blair Promise, PhD, MD  This document serves as a record of services personally performed by Gery Pray, MD. It was created on his behalf by Jenell Milliner, a trained medical scribe. The creation of this record is based on the scribe's personal observations and the provider's statements to them. This document has been checked and approved by the attending provider.

## 2016-01-04 ENCOUNTER — Other Ambulatory Visit (HOSPITAL_BASED_OUTPATIENT_CLINIC_OR_DEPARTMENT_OTHER): Payer: Commercial Managed Care - HMO

## 2016-01-04 ENCOUNTER — Ambulatory Visit (HOSPITAL_BASED_OUTPATIENT_CLINIC_OR_DEPARTMENT_OTHER): Payer: Commercial Managed Care - HMO

## 2016-01-04 ENCOUNTER — Ambulatory Visit: Payer: Commercial Managed Care - HMO | Admitting: Nutrition

## 2016-01-04 DIAGNOSIS — C541 Malignant neoplasm of endometrium: Secondary | ICD-10-CM | POA: Diagnosis not present

## 2016-01-04 DIAGNOSIS — C786 Secondary malignant neoplasm of retroperitoneum and peritoneum: Secondary | ICD-10-CM

## 2016-01-04 DIAGNOSIS — Z5111 Encounter for antineoplastic chemotherapy: Secondary | ICD-10-CM | POA: Diagnosis not present

## 2016-01-04 DIAGNOSIS — C801 Malignant (primary) neoplasm, unspecified: Principal | ICD-10-CM

## 2016-01-04 LAB — CBC WITH DIFFERENTIAL/PLATELET
BASO%: 0.1 % (ref 0.0–2.0)
Basophils Absolute: 0 10*3/uL (ref 0.0–0.1)
EOS%: 0 % (ref 0.0–7.0)
Eosinophils Absolute: 0 10*3/uL (ref 0.0–0.5)
HCT: 33.3 % — ABNORMAL LOW (ref 34.8–46.6)
HGB: 11 g/dL — ABNORMAL LOW (ref 11.6–15.9)
LYMPH%: 17.4 % (ref 14.0–49.7)
MCH: 32.1 pg (ref 25.1–34.0)
MCHC: 33 g/dL (ref 31.5–36.0)
MCV: 97.3 fL (ref 79.5–101.0)
MONO#: 0 10*3/uL — AB (ref 0.1–0.9)
MONO%: 0.7 % (ref 0.0–14.0)
NEUT%: 81.8 % — AB (ref 38.4–76.8)
NEUTROS ABS: 2 10*3/uL (ref 1.5–6.5)
PLATELETS: 209 10*3/uL (ref 145–400)
RBC: 3.43 10*6/uL — AB (ref 3.70–5.45)
RDW: 15.3 % — ABNORMAL HIGH (ref 11.2–14.5)
WBC: 2.5 10*3/uL — AB (ref 3.9–10.3)
lymph#: 0.4 10*3/uL — ABNORMAL LOW (ref 0.9–3.3)

## 2016-01-04 LAB — COMPREHENSIVE METABOLIC PANEL
ALBUMIN: 3.8 g/dL (ref 3.5–5.0)
ALK PHOS: 53 U/L (ref 40–150)
ALT: 19 U/L (ref 0–55)
ANION GAP: 9 meq/L (ref 3–11)
AST: 26 U/L (ref 5–34)
BUN: 16.7 mg/dL (ref 7.0–26.0)
CALCIUM: 9.7 mg/dL (ref 8.4–10.4)
CHLORIDE: 105 meq/L (ref 98–109)
CO2: 23 mEq/L (ref 22–29)
CREATININE: 0.9 mg/dL (ref 0.6–1.1)
EGFR: 76 mL/min/{1.73_m2} — ABNORMAL LOW (ref >90)
Glucose: 203 mg/dl — ABNORMAL HIGH (ref 70–140)
Potassium: 4.3 mEq/L (ref 3.5–5.1)
SODIUM: 137 meq/L (ref 136–145)
Total Protein: 8 g/dL (ref 6.4–8.3)

## 2016-01-04 MED ORDER — FAMOTIDINE IN NACL 20-0.9 MG/50ML-% IV SOLN
20.0000 mg | Freq: Once | INTRAVENOUS | Status: AC
  Administered 2016-01-04: 20 mg via INTRAVENOUS

## 2016-01-04 MED ORDER — CARBOPLATIN CHEMO INJECTION 450 MG/45ML
270.9000 mg | Freq: Once | INTRAVENOUS | Status: AC
  Administered 2016-01-04: 270 mg via INTRAVENOUS
  Filled 2016-01-04: qty 27

## 2016-01-04 MED ORDER — DIPHENHYDRAMINE HCL 50 MG/ML IJ SOLN
25.0000 mg | Freq: Once | INTRAMUSCULAR | Status: AC
  Administered 2016-01-04: 25 mg via INTRAVENOUS

## 2016-01-04 MED ORDER — SODIUM CHLORIDE 0.9 % IV SOLN
Freq: Once | INTRAVENOUS | Status: AC
  Administered 2016-01-04: 11:00:00 via INTRAVENOUS
  Filled 2016-01-04: qty 8

## 2016-01-04 MED ORDER — DIPHENHYDRAMINE HCL 50 MG/ML IJ SOLN
INTRAMUSCULAR | Status: AC
  Filled 2016-01-04: qty 1

## 2016-01-04 MED ORDER — FAMOTIDINE IN NACL 20-0.9 MG/50ML-% IV SOLN
INTRAVENOUS | Status: AC
  Filled 2016-01-04: qty 50

## 2016-01-04 MED ORDER — DEXTROSE 5 % IV SOLN
175.0000 mg/m2 | Freq: Once | INTRAVENOUS | Status: AC
  Administered 2016-01-04: 342 mg via INTRAVENOUS
  Filled 2016-01-04: qty 57

## 2016-01-04 MED ORDER — SODIUM CHLORIDE 0.9 % IV SOLN
Freq: Once | INTRAVENOUS | Status: AC
  Administered 2016-01-04: 11:00:00 via INTRAVENOUS

## 2016-01-04 NOTE — Progress Notes (Signed)
Nutrition follow-up completed with patient during infusion. Patient continues to lose weight. Weight was documented as 163.9 pounds March 16 decreased from 165.2 pounds February 23. Patient reports she continues to eat well and will drink one oral nutrition supplement daily. Patient denies nutrition impact symptoms.  Nutrition diagnosis: Unintended weight loss continues.  Intervention: Provided support and reinforcement for patient to continue to try to work to increase oral intake. Teach back method used.  Monitoring, evaluation, goals: Patient will work to increase oral intake to minimize further weight loss.  Next visit: I will schedule as needed.

## 2016-01-04 NOTE — Patient Instructions (Signed)
[facility] Discharge Instructions for Patients Receiving Chemotherapy  Today you received the following chemotherapy agents:  Carboplatin and Taxol.  To help prevent nausea and vomiting after your treatment, we encourage you to take your nausea medication as prescribed.   If you develop nausea and vomiting that is not controlled by your nausea medication, call the clinic.   BELOW ARE SYMPTOMS THAT SHOULD BE REPORTED IMMEDIATELY:  *FEVER GREATER THAN 100.5 F  *CHILLS WITH OR WITHOUT FEVER  NAUSEA AND VOMITING THAT IS NOT CONTROLLED WITH YOUR NAUSEA MEDICATION  *UNUSUAL SHORTNESS OF BREATH  *UNUSUAL BRUISING OR BLEEDING  TENDERNESS IN MOUTH AND THROAT WITH OR WITHOUT PRESENCE OF ULCERS  *URINARY PROBLEMS  *BOWEL PROBLEMS  UNUSUAL RASH Items with * indicate a potential emergency and should be followed up as soon as possible.  Feel free to call the clinic you have any questions or concerns. The clinic phone number is [phone].  Please show the CHEMO ALERT CARD at check-in to the Emergency Department and triage nurse.   

## 2016-01-05 LAB — CA 125: Cancer Antigen (CA) 125: 683.2 U/mL — ABNORMAL HIGH (ref 0.0–38.1)

## 2016-01-06 ENCOUNTER — Ambulatory Visit (HOSPITAL_BASED_OUTPATIENT_CLINIC_OR_DEPARTMENT_OTHER): Payer: Commercial Managed Care - HMO

## 2016-01-06 VITALS — BP 166/83 | HR 91 | Temp 98.4°F

## 2016-01-06 DIAGNOSIS — C541 Malignant neoplasm of endometrium: Secondary | ICD-10-CM | POA: Diagnosis not present

## 2016-01-06 DIAGNOSIS — D6481 Anemia due to antineoplastic chemotherapy: Secondary | ICD-10-CM | POA: Diagnosis not present

## 2016-01-06 DIAGNOSIS — C801 Malignant (primary) neoplasm, unspecified: Secondary | ICD-10-CM

## 2016-01-06 DIAGNOSIS — C786 Secondary malignant neoplasm of retroperitoneum and peritoneum: Secondary | ICD-10-CM | POA: Diagnosis not present

## 2016-01-06 MED ORDER — PEGFILGRASTIM INJECTION 6 MG/0.6ML ~~LOC~~
6.0000 mg | PREFILLED_SYRINGE | Freq: Once | SUBCUTANEOUS | Status: AC
  Administered 2016-01-06: 6 mg via SUBCUTANEOUS
  Filled 2016-01-06: qty 0.6

## 2016-01-12 ENCOUNTER — Ambulatory Visit (HOSPITAL_COMMUNITY)
Admission: RE | Admit: 2016-01-12 | Discharge: 2016-01-12 | Disposition: A | Discharge status: Home / Self Care | Payer: Commercial Managed Care - HMO | Source: Ambulatory Visit | Attending: Oncology | Admitting: Oncology

## 2016-01-12 ENCOUNTER — Encounter (HOSPITAL_COMMUNITY): Payer: Self-pay

## 2016-01-12 DIAGNOSIS — C541 Malignant neoplasm of endometrium: Secondary | ICD-10-CM | POA: Diagnosis not present

## 2016-01-12 DIAGNOSIS — K4090 Unilateral inguinal hernia, without obstruction or gangrene, not specified as recurrent: Secondary | ICD-10-CM | POA: Diagnosis not present

## 2016-01-12 DIAGNOSIS — I700 Atherosclerosis of aorta: Secondary | ICD-10-CM | POA: Insufficient documentation

## 2016-01-12 DIAGNOSIS — R188 Other ascites: Secondary | ICD-10-CM | POA: Diagnosis not present

## 2016-01-12 DIAGNOSIS — Z9221 Personal history of antineoplastic chemotherapy: Secondary | ICD-10-CM | POA: Insufficient documentation

## 2016-01-12 DIAGNOSIS — K838 Other specified diseases of biliary tract: Secondary | ICD-10-CM | POA: Insufficient documentation

## 2016-01-12 MED ORDER — IOPAMIDOL (ISOVUE-300) INJECTION 61%
100.0000 mL | Freq: Once | INTRAVENOUS | Status: AC | PRN
  Administered 2016-01-12: 100 mL via INTRAVENOUS

## 2016-01-16 ENCOUNTER — Other Ambulatory Visit: Payer: Self-pay | Admitting: Oncology

## 2016-01-17 ENCOUNTER — Other Ambulatory Visit: Payer: Self-pay

## 2016-01-17 ENCOUNTER — Other Ambulatory Visit (HOSPITAL_BASED_OUTPATIENT_CLINIC_OR_DEPARTMENT_OTHER): Payer: Commercial Managed Care - HMO

## 2016-01-17 ENCOUNTER — Encounter: Payer: Self-pay | Admitting: Oncology

## 2016-01-17 ENCOUNTER — Ambulatory Visit (HOSPITAL_BASED_OUTPATIENT_CLINIC_OR_DEPARTMENT_OTHER): Payer: Commercial Managed Care - HMO | Admitting: Oncology

## 2016-01-17 VITALS — BP 174/78 | HR 82 | Temp 98.3°F | Resp 18 | Ht 66.0 in

## 2016-01-17 DIAGNOSIS — D509 Iron deficiency anemia, unspecified: Secondary | ICD-10-CM

## 2016-01-17 DIAGNOSIS — D6481 Anemia due to antineoplastic chemotherapy: Secondary | ICD-10-CM | POA: Diagnosis not present

## 2016-01-17 DIAGNOSIS — C801 Malignant (primary) neoplasm, unspecified: Principal | ICD-10-CM

## 2016-01-17 DIAGNOSIS — N189 Chronic kidney disease, unspecified: Secondary | ICD-10-CM

## 2016-01-17 DIAGNOSIS — C541 Malignant neoplasm of endometrium: Secondary | ICD-10-CM

## 2016-01-17 DIAGNOSIS — C786 Secondary malignant neoplasm of retroperitoneum and peritoneum: Secondary | ICD-10-CM

## 2016-01-17 DIAGNOSIS — D701 Agranulocytosis secondary to cancer chemotherapy: Secondary | ICD-10-CM | POA: Diagnosis not present

## 2016-01-17 DIAGNOSIS — D631 Anemia in chronic kidney disease: Secondary | ICD-10-CM

## 2016-01-17 DIAGNOSIS — T451X5A Adverse effect of antineoplastic and immunosuppressive drugs, initial encounter: Secondary | ICD-10-CM

## 2016-01-17 LAB — COMPREHENSIVE METABOLIC PANEL
ALBUMIN: 3.6 g/dL (ref 3.5–5.0)
ALK PHOS: 60 U/L (ref 40–150)
ALT: 18 U/L (ref 0–55)
AST: 29 U/L (ref 5–34)
Anion Gap: 7 mEq/L (ref 3–11)
BUN: 13.3 mg/dL (ref 7.0–26.0)
CALCIUM: 9.5 mg/dL (ref 8.4–10.4)
CO2: 28 mEq/L (ref 22–29)
Chloride: 106 mEq/L (ref 98–109)
Creatinine: 0.9 mg/dL (ref 0.6–1.1)
EGFR: 69 mL/min/{1.73_m2} — AB (ref >90)
Glucose: 112 mg/dl (ref 70–140)
POTASSIUM: 4 meq/L (ref 3.5–5.1)
Sodium: 141 mEq/L (ref 136–145)
Total Bilirubin: <0.3 mg/dL (ref 0.20–1.20)
Total Protein: 7.3 g/dL (ref 6.4–8.3)

## 2016-01-17 LAB — CBC WITH DIFFERENTIAL/PLATELET
BASO%: 0.3 % (ref 0.0–2.0)
BASOS ABS: 0 10*3/uL (ref 0.0–0.1)
EOS ABS: 0 10*3/uL (ref 0.0–0.5)
EOS%: 0.6 % (ref 0.0–7.0)
HEMATOCRIT: 32.1 % — AB (ref 34.8–46.6)
HEMOGLOBIN: 10.7 g/dL — AB (ref 11.6–15.9)
LYMPH#: 1.3 10*3/uL (ref 0.9–3.3)
LYMPH%: 20.7 % (ref 14.0–49.7)
MCH: 32.2 pg (ref 25.1–34.0)
MCHC: 33.3 g/dL (ref 31.5–36.0)
MCV: 96.8 fL (ref 79.5–101.0)
MONO#: 0.7 10*3/uL (ref 0.1–0.9)
MONO%: 10.2 % (ref 0.0–14.0)
NEUT#: 4.4 10*3/uL (ref 1.5–6.5)
NEUT%: 68.2 % (ref 38.4–76.8)
Platelets: 195 10*3/uL (ref 145–400)
RBC: 3.31 10*6/uL — ABNORMAL LOW (ref 3.70–5.45)
RDW: 14.6 % — AB (ref 11.2–14.5)
WBC: 6.5 10*3/uL (ref 3.9–10.3)

## 2016-01-17 MED ORDER — DEXAMETHASONE 4 MG PO TABS: ORAL_TABLET | ORAL | Status: DC

## 2016-01-17 NOTE — Progress Notes (Signed)
OFFICE PROGRESS NOTE   January 19, 2016   Physicians: W.Brewster/ Emma Rossi,Plotnikov, [name], MD, Gery Pray, Perrin GI [name]), [name]  INTERVAL HISTORY:  Patient is seen, together with husband, having had restaging CT AP on 01-12-16 after cycle 6 carbo taxol given 01-04-16 for recurrent high grade endometrial carcinoma. CT shows persistent disease, tho overall significant improvement. Patient is tolerating chemotherapy at present dosing with neulasta support, clinically much improved over presentation.  Patient tolerated CT including oral contrast without difficulty. She has felt well in past week, with good appetite and energy reported (tho not very active). She has occasional discomfort in low abdomen, not clearly related to bowel movements or other. 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 She denies nausea, fever, SOB, bladder symptoms, bleeding, LE swelling.  Remainder of 10 point Review of Systems negative.     Flu vaccine 08-09-15 No central line No genetics testing  CA 125 09-14-15 2427  ONCOLOGIC HISTORY  Patient has had regular medical care by PCP Dr Alain Marion and gyn Dr Garwin Brothers. Sometime after a fall in spring 2016, she noticed low pelvic discomfort and some intermittent vaginal spotting. She was seen for regular visit by Dr Garwin Brothers in May 2016, with PAP positive for AGUS and US showing endometrial stripe 8.57m. Endometrial biopsy 03-12-15 showed high grade adenocarcinoma with papillary features, however immunostains were not typical for serous carcinoma. She was seen by Dr RDenman Georgein June. CT CAP 04-14-15 showed no evidence of metastatic disease in chest, a 3.3 x 2.8 cm endometrial mas and 3.1 cm cystic lesion of right ovary. She had robotic total laparoscopic hysterectomy BSO with bilateral [facility] and [facility] Pathology ([phone] grade 2  endometrioid adenocarcinoma invading 0.35 cm where myometrium 1.1 cm, 26 nodes negative and no LVSI. She had intracavitary radiation due to risk for vaginal cuff recurrence from 06-01-15 thru 07-01-15, vaginal cuff 27.5 gray in 5 fractions. There were no obvious concerns when she was seen by Dr RDenman Georgein late July or Dr KSondra Comeand Dr PAlain Marionin October. She presented to ED on 09-06-15 with generalized abdominal pain, bloating and constipation x 1 month. CT AP 09-06-15 showed extensive nodularity and stranding thruout omentum and large volume ascites, with liver/spleen/pancreas/adrenals not remarkable, no bowel obstruction. She was admitted x 24 hours, with UKoreaparacentesi on 09-07-15 for 5 liters of serous fluid, cytology (([id number] adenocarcinoma. SHe was much more comfortable after paracentesis and cannot tell that the fluid has reaccumulated as yet. She had consultation with Dr RDenman Georgeon 09-13-15, recommendation for systemic chemotherapy. Review of cytology from ascites compared with surgical specimen and [facility] high grade features that were seen on the initial endometrial biopsy (not seen on the full surgical path). She had repeat UKoreaparacentesis for 5.2 liters on 09-20-15. She had first carboplatin taxol on 09-21-15 and was neutropenic by day 8 cycle 1, granix begun. She had cycle 2 at same doses (carbo AUC = 3.5) 0n 10-13-15 with neulasta on 10-15-15 with neulasta on 12-30, and was neutropenic on day 7.   Objective:  Vital signs in last 24 hours:  BP 174/78 mmHg  Pulse 82  Temp(Src) 98.3 F (36.8 C) (Oral)  Resp 18  Ht '5\' 6"'$  (1.676 m)  SpO2 100%  Alert, oriented and appropriate. Ambulatory without difficulty, easily able to get on and off  exam table  Alopecia  HEENT:PERRL, sclerae not icteric. Oral mucosa moist without lesions, posterior pharynx clear.  Neck supple. No JVD.  Lymphatics:no cervical,supraclavicular or inguinal  adenopathy Resp: clear to auscultation bilaterally and normal percussion bilaterally Cardio: regular rate and rhythm. No gallop. GI: soft, nontender including lower quadrants, not distended, no mass or organomegaly. Normally active bowel sounds. Surgical incision not remarkable. Musculoskeletal/ Extremities: without pitting edema, cords, tenderness Neuro: no significant peripheral neuropathy. Otherwise nonfocal. PSYCH appropriate mood and affect Skin without rash, ecchymosis, petechiae  Lab Results:  Results for orders placed or performed in visit on 01/17/16  CBC with Differential  Result Value Ref Range   WBC 6.5 3.9 - 10.3 10e3/uL   NEUT# 4.4 1.5 - 6.5 10e3/uL   HGB 10.7 (L) 11.6 - 15.9 g/dL   HCT 32.1 (L) 34.8 - 46.6 %   Platelets 195 145 - 400 10e3/uL   MCV 96.8 79.5 - 101.0 fL   MCH 32.2 25.1 - 34.0 pg   MCHC 33.3 31.5 - 36.0 g/dL   RBC 3.31 (L) 3.70 - 5.45 10e6/uL   RDW 14.6 (H) 11.2 - 14.5 %   lymph# 1.3 0.9 - 3.3 10e3/uL   MONO# 0.7 0.1 - 0.9 10e3/uL   Eosinophils Absolute 0.0 0.0 - 0.5 10e3/uL   Basophils Absolute 0.0 0.0 - 0.1 10e3/uL   NEUT% 68.2 38.4 - 76.8 %   LYMPH% 20.7 14.0 - 49.7 %   MONO% 10.2 0.0 - 14.0 %   EOS% 0.6 0.0 - 7.0 %   BASO% 0.3 0.0 - 2.0 %  Comprehensive metabolic panel  Result Value Ref Range   Sodium 141 136 - 145 mEq/L   Potassium 4.0 3.5 - 5.1 mEq/L   Chloride 106 98 - 109 mEq/L   CO2 28 22 - 29 mEq/L   Glucose 112 70 - 140 mg/dl   BUN 13.3 7.0 - 26.0 mg/dL   Creatinine 0.9 0.6 - 1.1 mg/dL   Total Bilirubin <0.30 0.20 - 1.20 mg/dL   Alkaline Phosphatase 60 40 - 150 U/L   AST 29 5 - 34 U/L   ALT 18 0 - 55 U/L   Total Protein 7.3 6.4 - 8.3 g/dL   Albumin 3.6 3.5 - 5.0 g/dL   Calcium 9.5 8.4 - 10.4 mg/dL   Anion Gap 7 3 - 11 mEq/L   EGFR 69 (L) >90 ml/min/1.73 m2     Studies/Results: CT ABDOMEN AND PELVIS WITH CONTRAST  COMPARISON: 09/06/2015  FINDINGS: Lower chest: Unremarkable.  Hepatobiliary: No focal abnormality  within the liver parenchyma. Gallbladder surgically absent. Common bile duct measures 11 mm diameter in the pancreatic head compared to 9 mm on the previous study.  Pancreas: Stable slight prominence of the main pancreatic duct. No evidence for focal pancreatic mass lesion.  Spleen: No splenomegaly. No focal mass lesion.  Adrenals/Urinary Tract: No adrenal nodule or mass. Kidneys are unremarkable. No evidence for hydroureter. The urinary bladder appears normal for the degree of distention.  Stomach/Bowel: Small hiatal hernia noted. Stomach otherwise unremarkable. Duodenum is normally positioned as is the ligament of Treitz. No small bowel wall thickening. No small bowel dilatation. The terminal ileum is normal. The appendix is normal. No gross colonic mass. No colonic wall thickening. No substantial diverticular change.  Vascular/Lymphatic: There is abdominal aortic atherosclerosis without aneurysm. No gastrohepatic or hepatoduodenal ligament lymphadenopathy. No retroperitoneal lymphadenopathy. Persistent nodularity is identified in the omentum, mainly in the region of the flexures, and overall this appears decreased in the  interval. No pelvic sidewall lymphadenopathy.  Reproductive: Uterus is surgically absent. There is no adnexal mass.  Other: Small volume free fluid is seen around the liver and in both para colic gutters. There is a small volume of intraperitoneal free fluid in the pelvis.  Musculoskeletal: Small bilateral groin hernias contain only fat. Sclerosis in the right pubic bone is unchanged. Bone windows reveal no worrisome lytic or sclerotic osseous lesions.  IMPRESSION: 1. Marked interval decrease in ascites with persistent but clearly decreased nodularity and stranding of the omentum. Imaging features suggest interval improvement in metastatic disease. 2. Slight increased in dilatation of the common bile duct without evidence of an obstructing  lesion. Correlation with liver function test may prove helpful. 3. Small bilateral groin hernias contain only fat. 4. Abdominal aortic atherosclerosis.   PACS images reviewed by MD and information discussed with patient and husband, who preferred not to see the images   Medications: I have reviewed the patient's current medications.  DISCUSSION Improvement but still evidence of some carcinomatosis and some ascites. As she is tolerating chemo adequately, will continue this another 3 cycles then repeat CTs, as long as no concern for progression in interim. We have discussed usefulness of using a chemo regimen that is tolerable to maximum benefit, with goal of controlling the disease while allowing adequate quality of life.  I have explained skin testing prior to [place] doses after cycle 6. I have offered on pro neulasta to save them one trip to office, however she prefers to have neulasta injection given at [facility].    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 Chemotherapy begun with carbo taxol 09-21-15, Tolerating present doses with neulasta support . Clinically improving, CT AP 01-12-16 after cycle 6 persistant carcinomatosis and ascites tho much improved, CA 125 not yet normalized. Will try additional 3 cycles if tolerated, with carbo skin testing and neulasta support, then repeat scans.   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 No adjuvant chemotherapy with those surgical findings. Initial biopsy specimen only, not surgical path, had a high grade component [facility] path), which is identical to this recurrent disease. 2. Chemo neutropenia by day 8 cycle 1, and neutropenic day 7 cycle 2 despite neulaasta day 3 cycle 2 (and  dose reduced [place]). Previous pelvic RT likely impacting counts. WIll keep carbo dose same as cycle 3.  Note neulasta to be given at [facility]  3.Admission for tachycardia after cycle 1, HR sustained 150-160, no other findings and resolved when beta blocker was resumed, this DCd in error by patient. 4.chemo anemia, anemia of CKD and iron deficiency anemia: Continue oral iron and follow, fairly stable now 5.renal insufficiency improved with resolution of the ascites, creatinine again 0.9.  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 Managed by Dr Alain Marion 8.post cholecystectomy and up to date colonoscopy and mammograms 9. Poor nutritional status: po intake improved, [facility] dietician involved 10.squamous cell carcinoma anus 2003: radiation and may have had chemo then, may be impacting counts with present chemo.  All questions answered and they know to call prior to scheduled appointment if needed. Chemo and neulasta orders  confirmed. Time spent 25 min including >50% counseling and coordination of care. Route to PCP (appointment 4-25 which I have encouraged her to keep), and update gyn onc   Monque Haggar P, MD   01/19/2016, 12:29 PM

## 2016-01-18 ENCOUNTER — Telehealth: Payer: Self-pay | Admitting: Oncology

## 2016-01-18 NOTE — Telephone Encounter (Signed)
Added appts to pt sch per LL 4/3 pof. Pt will get updated sch printed at next visit 4/6

## 2016-01-19 ENCOUNTER — Other Ambulatory Visit: Payer: Self-pay | Admitting: Oncology

## 2016-01-19 ENCOUNTER — Telehealth: Payer: Self-pay

## 2016-01-19 ENCOUNTER — Telehealth: Payer: Self-pay | Admitting: Oncology

## 2016-01-19 NOTE — Telephone Encounter (Signed)
Spoke with patient to inform her of appt changed per LL 4/5 pof. Pt aware of new dates/times

## 2016-01-19 NOTE — Telephone Encounter (Signed)
Discussed rationale for rescheduling chemotherapy from 4-6 to 01-25-16. Reviewed when to take decadron premedication on 01-25-16 at 1230 and 0630 with food. Ms. Pashia  Verbalized understanding.

## 2016-01-19 NOTE — Telephone Encounter (Signed)
-----   Message from Gordy Levan, MD sent at 01/19/2016 11:40 AM EDT ----- Regarding: RE: treatment dates? Thank you, Kolleen, 4-6 is not correct for every 3 week schedule. RN please let patient know I will redo POF for scheduling now  Lennis ----- Message -----    From: Margaret Pyle, [facility]    Sent: 01/19/2016  10:58 AM      To: Gordy Levan, MD Subject: treatment dates?                               Dr. Marko Plume,  Reviewing charts for tomorrow and Peroni is on the schedule for treatment with Carbo/Taxol (with carbo test dose).  This is early, as her last treatment date was 3/21.  The treatment plan is dated 4/11 and your last pof has her returning 4/27.  Please advise of dates you want the next few cycles given and we will get appmts and treatment plans matching.   Thanks,  [employer]

## 2016-01-20 ENCOUNTER — Other Ambulatory Visit: Payer: Commercial Managed Care - HMO

## 2016-01-20 ENCOUNTER — Ambulatory Visit: Payer: Commercial Managed Care - HMO

## 2016-01-22 ENCOUNTER — Ambulatory Visit: Payer: Commercial Managed Care - HMO

## 2016-01-25 ENCOUNTER — Ambulatory Visit (HOSPITAL_BASED_OUTPATIENT_CLINIC_OR_DEPARTMENT_OTHER): Payer: Commercial Managed Care - HMO

## 2016-01-25 ENCOUNTER — Other Ambulatory Visit (HOSPITAL_BASED_OUTPATIENT_CLINIC_OR_DEPARTMENT_OTHER): Payer: Commercial Managed Care - HMO

## 2016-01-25 VITALS — BP 169/77 | HR 107 | Temp 99.1°F | Resp 18

## 2016-01-25 DIAGNOSIS — C801 Malignant (primary) neoplasm, unspecified: Principal | ICD-10-CM

## 2016-01-25 DIAGNOSIS — C786 Secondary malignant neoplasm of retroperitoneum and peritoneum: Secondary | ICD-10-CM | POA: Diagnosis not present

## 2016-01-25 DIAGNOSIS — Z5111 Encounter for antineoplastic chemotherapy: Secondary | ICD-10-CM

## 2016-01-25 DIAGNOSIS — C541 Malignant neoplasm of endometrium: Secondary | ICD-10-CM

## 2016-01-25 LAB — CBC WITH DIFFERENTIAL/PLATELET
BASO%: 0.1 % (ref 0.0–2.0)
Basophils Absolute: 0 10*3/uL (ref 0.0–0.1)
EOS ABS: 0 10*3/uL (ref 0.0–0.5)
EOS%: 0 % (ref 0.0–7.0)
HEMATOCRIT: 35.9 % (ref 34.8–46.6)
HEMOGLOBIN: 11.9 g/dL (ref 11.6–15.9)
LYMPH#: 0.4 10*3/uL — AB (ref 0.9–3.3)
LYMPH%: 12.4 % — AB (ref 14.0–49.7)
MCH: 31.9 pg (ref 25.1–34.0)
MCHC: 33 g/dL (ref 31.5–36.0)
MCV: 96.7 fL (ref 79.5–101.0)
MONO#: 0 10*3/uL — AB (ref 0.1–0.9)
MONO%: 0.9 % (ref 0.0–14.0)
NEUT%: 86.6 % — ABNORMAL HIGH (ref 38.4–76.8)
NEUTROS ABS: 3.1 10*3/uL (ref 1.5–6.5)
PLATELETS: 203 10*3/uL (ref 145–400)
RBC: 3.71 10*6/uL (ref 3.70–5.45)
RDW: 13.9 % (ref 11.2–14.5)
WBC: 3.6 10*3/uL — AB (ref 3.9–10.3)

## 2016-01-25 LAB — COMPREHENSIVE METABOLIC PANEL
ALBUMIN: 3.9 g/dL (ref 3.5–5.0)
ALK PHOS: 52 U/L (ref 40–150)
ALT: 18 U/L (ref 0–55)
ANION GAP: 10 meq/L (ref 3–11)
AST: 28 U/L (ref 5–34)
BUN: 16.9 mg/dL (ref 7.0–26.0)
CO2: 24 meq/L (ref 22–29)
CREATININE: 1 mg/dL (ref 0.6–1.1)
Calcium: 9.9 mg/dL (ref 8.4–10.4)
Chloride: 105 mEq/L (ref 98–109)
EGFR: 66 mL/min/{1.73_m2} — AB (ref >90)
Glucose: 224 mg/dl — ABNORMAL HIGH (ref 70–140)
Potassium: 4.3 mEq/L (ref 3.5–5.1)
Sodium: 138 mEq/L (ref 136–145)
TOTAL PROTEIN: 8.2 g/dL (ref 6.4–8.3)

## 2016-01-25 MED ORDER — SODIUM CHLORIDE 0.9 % IV SOLN
Freq: Once | INTRAVENOUS | Status: AC
  Administered 2016-01-25: 13:00:00 via INTRAVENOUS

## 2016-01-25 MED ORDER — DIPHENHYDRAMINE HCL 50 MG/ML IJ SOLN
INTRAMUSCULAR | Status: AC
  Filled 2016-01-25: qty 1

## 2016-01-25 MED ORDER — CARBOPLATIN CHEMO INTRADERMAL TEST DOSE 100MCG/0.02ML
100.0000 ug | Freq: Once | INTRADERMAL | Status: AC
  Administered 2016-01-25: 100 ug via INTRADERMAL
  Filled 2016-01-25: qty 0.01

## 2016-01-25 MED ORDER — FAMOTIDINE IN NACL 20-0.9 MG/50ML-% IV SOLN
20.0000 mg | Freq: Once | INTRAVENOUS | Status: AC
  Administered 2016-01-25: 20 mg via INTRAVENOUS

## 2016-01-25 MED ORDER — DIPHENHYDRAMINE HCL 50 MG/ML IJ SOLN
25.0000 mg | Freq: Once | INTRAMUSCULAR | Status: AC
  Administered 2016-01-25: 25 mg via INTRAVENOUS

## 2016-01-25 MED ORDER — SODIUM CHLORIDE 0.9 % IV SOLN
Freq: Once | INTRAVENOUS | Status: AC
  Administered 2016-01-25: 14:00:00 via INTRAVENOUS
  Filled 2016-01-25: qty 8

## 2016-01-25 MED ORDER — SODIUM CHLORIDE 0.9 % IV SOLN
268.2000 mg | Freq: Once | INTRAVENOUS | Status: AC
  Administered 2016-01-25: 270 mg via INTRAVENOUS
  Filled 2016-01-25: qty 27

## 2016-01-25 MED ORDER — SODIUM CHLORIDE 0.9 % IV SOLN
175.0000 mg/m2 | Freq: Once | INTRAVENOUS | Status: AC
  Administered 2016-01-25: 342 mg via INTRAVENOUS
  Filled 2016-01-25: qty 50

## 2016-01-25 MED ORDER — FAMOTIDINE IN NACL 20-0.9 MG/50ML-% IV SOLN
INTRAVENOUS | Status: AC
  Filled 2016-01-25: qty 50

## 2016-01-25 NOTE — Patient Instructions (Signed)
[facility] Discharge Instructions for Patients Receiving Chemotherapy  Today you received the following chemotherapy agents Taxol and Carboplatin.  To help prevent nausea and vomiting after your treatment, we encourage you to take your nausea medication as prescribed.   If you develop nausea and vomiting that is not controlled by your nausea medication, call the clinic.   BELOW ARE SYMPTOMS THAT SHOULD BE REPORTED IMMEDIATELY:  *FEVER GREATER THAN 100.5 F  *CHILLS WITH OR WITHOUT FEVER  NAUSEA AND VOMITING THAT IS NOT CONTROLLED WITH YOUR NAUSEA MEDICATION  *UNUSUAL SHORTNESS OF BREATH  *UNUSUAL BRUISING OR BLEEDING  TENDERNESS IN MOUTH AND THROAT WITH OR WITHOUT PRESENCE OF ULCERS  *URINARY PROBLEMS  *BOWEL PROBLEMS  UNUSUAL RASH Items with * indicate a potential emergency and should be followed up as soon as possible.  Feel free to call the clinic you have any questions or concerns. The clinic phone number is [phone].  Please show the CHEMO ALERT CARD at check-in to the Emergency Department and triage nurse.   

## 2016-01-27 ENCOUNTER — Ambulatory Visit (HOSPITAL_BASED_OUTPATIENT_CLINIC_OR_DEPARTMENT_OTHER): Payer: Commercial Managed Care - HMO

## 2016-01-27 VITALS — BP 157/88 | HR 87 | Temp 97.7°F

## 2016-01-27 DIAGNOSIS — C801 Malignant (primary) neoplasm, unspecified: Secondary | ICD-10-CM

## 2016-01-27 DIAGNOSIS — C786 Secondary malignant neoplasm of retroperitoneum and peritoneum: Secondary | ICD-10-CM

## 2016-01-27 DIAGNOSIS — D701 Agranulocytosis secondary to cancer chemotherapy: Secondary | ICD-10-CM | POA: Diagnosis not present

## 2016-01-27 DIAGNOSIS — C541 Malignant neoplasm of endometrium: Secondary | ICD-10-CM

## 2016-01-27 MED ORDER — PEGFILGRASTIM INJECTION 6 MG/0.6ML ~~LOC~~
6.0000 mg | PREFILLED_SYRINGE | Freq: Once | SUBCUTANEOUS | Status: AC
  Administered 2016-01-27: 6 mg via SUBCUTANEOUS
  Filled 2016-01-27: qty 0.6

## 2016-02-06 ENCOUNTER — Other Ambulatory Visit: Payer: Self-pay | Admitting: Oncology

## 2016-02-07 ENCOUNTER — Telehealth: Payer: Self-pay | Admitting: Oncology

## 2016-02-07 ENCOUNTER — Other Ambulatory Visit: Payer: Self-pay

## 2016-02-07 ENCOUNTER — Ambulatory Visit (HOSPITAL_BASED_OUTPATIENT_CLINIC_OR_DEPARTMENT_OTHER): Payer: Commercial Managed Care - HMO | Admitting: Oncology

## 2016-02-07 ENCOUNTER — Encounter: Payer: Self-pay | Admitting: Oncology

## 2016-02-07 ENCOUNTER — Other Ambulatory Visit (HOSPITAL_BASED_OUTPATIENT_CLINIC_OR_DEPARTMENT_OTHER): Payer: Commercial Managed Care - HMO

## 2016-02-07 VITALS — BP 179/78 | HR 88 | Temp 98.0°F | Resp 18 | Wt 164.4 lb

## 2016-02-07 DIAGNOSIS — C786 Secondary malignant neoplasm of retroperitoneum and peritoneum: Secondary | ICD-10-CM

## 2016-02-07 DIAGNOSIS — C541 Malignant neoplasm of endometrium: Secondary | ICD-10-CM

## 2016-02-07 DIAGNOSIS — K219 Gastro-esophageal reflux disease without esophagitis: Secondary | ICD-10-CM

## 2016-02-07 DIAGNOSIS — G620 Drug-induced polyneuropathy: Secondary | ICD-10-CM | POA: Diagnosis not present

## 2016-02-07 DIAGNOSIS — C801 Malignant (primary) neoplasm, unspecified: Secondary | ICD-10-CM

## 2016-02-07 DIAGNOSIS — N183 Chronic kidney disease, stage 3 unspecified: Secondary | ICD-10-CM

## 2016-02-07 DIAGNOSIS — T451X5A Adverse effect of antineoplastic and immunosuppressive drugs, initial encounter: Secondary | ICD-10-CM

## 2016-02-07 DIAGNOSIS — D701 Agranulocytosis secondary to cancer chemotherapy: Secondary | ICD-10-CM

## 2016-02-07 LAB — COMPREHENSIVE METABOLIC PANEL
ALT: 16 U/L (ref 0–55)
AST: 29 U/L (ref 5–34)
Albumin: 3.8 g/dL (ref 3.5–5.0)
Alkaline Phosphatase: 65 U/L (ref 40–150)
Anion Gap: 8 mEq/L (ref 3–11)
BUN: 12.5 mg/dL (ref 7.0–26.0)
CHLORIDE: 107 meq/L (ref 98–109)
CO2: 27 meq/L (ref 22–29)
CREATININE: 0.9 mg/dL (ref 0.6–1.1)
Calcium: 9.8 mg/dL (ref 8.4–10.4)
EGFR: 73 mL/min/{1.73_m2} — ABNORMAL LOW (ref >90)
GLUCOSE: 118 mg/dL (ref 70–140)
Potassium: 4.1 mEq/L (ref 3.5–5.1)
Sodium: 141 mEq/L (ref 136–145)
TOTAL PROTEIN: 7.5 g/dL (ref 6.4–8.3)

## 2016-02-07 LAB — CBC WITH DIFFERENTIAL/PLATELET
BASO%: 0.1 % (ref 0.0–2.0)
Basophils Absolute: 0 10*3/uL (ref 0.0–0.1)
EOS ABS: 0 10*3/uL (ref 0.0–0.5)
EOS%: 0.4 % (ref 0.0–7.0)
HEMATOCRIT: 33.4 % — AB (ref 34.8–46.6)
HGB: 10.9 g/dL — ABNORMAL LOW (ref 11.6–15.9)
LYMPH#: 1.5 10*3/uL (ref 0.9–3.3)
LYMPH%: 19.7 % (ref 14.0–49.7)
MCH: 31.8 pg (ref 25.1–34.0)
MCHC: 32.6 g/dL (ref 31.5–36.0)
MCV: 97.4 fL (ref 79.5–101.0)
MONO#: 0.6 10*3/uL (ref 0.1–0.9)
MONO%: 7.9 % (ref 0.0–14.0)
NEUT%: 71.9 % (ref 38.4–76.8)
NEUTROS ABS: 5.6 10*3/uL (ref 1.5–6.5)
PLATELETS: 162 10*3/uL (ref 145–400)
RBC: 3.43 10*6/uL — AB (ref 3.70–5.45)
RDW: 13.8 % (ref 11.2–14.5)
WBC: 7.8 10*3/uL (ref 3.9–10.3)

## 2016-02-07 MED ORDER — DEXAMETHASONE 4 MG PO TABS: ORAL_TABLET | ORAL | Status: DC

## 2016-02-07 MED ORDER — FERROCITE 324 MG PO TABS: 1.0000 | ORAL_TABLET | Freq: Every day | ORAL | Status: DC

## 2016-02-07 MED ORDER — LORAZEPAM 0.5 MG PO TABS: ORAL_TABLET | ORAL | Status: DC

## 2016-02-07 NOTE — Telephone Encounter (Signed)
appt made and avs printed. CT to be scheduled by central radiology °

## 2016-02-07 NOTE — Progress Notes (Signed)
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 CT after cycle 6 showed improvement tho disease still present. She had cycle 7 carboplatin taxol on 01-25-16 with carbo skin test and with neulasta given 01-27-16. Plan is to repeat scans after cycle 9; I have also requested follow up appointment with Dr Ginnie Smart after cycle 9. She is to see Dr Alain Marion on 02-08-16.   Patient has felt well overall since last chemotherapy. She has slight peripheral neuropathy tips of fingers which is slightly bothersome with fine motor activities such as buttoning; slight neuropathy soles of feet improves with massage. She had aching just for 1 day after neulasta. Peripheral IV access still accomplished, 1-2 attempts. Appetite is good, bowels are moving regularly, no abdominal or pelvic discomfort, no bleeding, no LE swelling. She denies SOB. She has had no fever and no symptoms of infection. She is still mostly sedentary, discussed trying to increase walking up to 20-30 min daily.  Remainder of 10 point Review of Systems negative  Flu vaccine 08-09-15 No central line No genetics testing  CA 125 09-14-15 2427  ONCOLOGIC HISTORY  Patient had regular medical care by PCP Dr Alain Marion and gyn Dr Garwin Brothers. In spring 2016, she noticed low pelvic discomfort and some intermittent vaginal spotting. She was seen for regular visit by Dr Garwin Brothers in May 2016, with PAP positive for AGUS and US showing endometrial stripe 8.63m. Endometrial biopsy 03-12-15 showed high grade adenocarcinoma with papillary features, however immunostains were not typical for serous carcinoma. She was seen by Dr RDenman Georgein June. CT CAP 04-14-15 showed no evidence of metastatic  disease in chest, a 3.3 x 2.8 cm endometrial mas and 3.1 cm cystic lesion of right ovary. She had robotic total laparoscopic hysterectomy BSO with bilateral pelvic and para aortic node [facility]. Pathology ([phone] grade 2 endometrioid adenocarcinoma invading 0.35 cm where myometrium 1.1 cm, 26 nodes negative and no LVSI. She had intracavitary radiation due to risk for vaginal cuff recurrence from 06-01-15 thru 07-01-15, vaginal cuff 27.5 gray in 5 fractions. There were no obvious concerns when she was seen by Dr RDenman Georgein late July or Dr KSondra Comeand Dr PAlain Marionin October. She presented to ED on 09-06-15 with generalized abdominal pain, bloating and constipation x 1 month. CT AP 09-06-15 showed extensive nodularity and stranding thruout omentum and large volume ascites, with liver/spleen/pancreas/adrenals not remarkable, no bowel obstruction. CA 125 was 2427. She had UKoreaparacentesi on 09-07-15 for 5 liters of serous fluid, cytology (([id number] adenocarcinoma. She had consultation with Dr RDenman Georgeon 09-13-15, recommendation for systemic chemotherapy. Review of cytology from ascites compared with surgical specimen and [facility] high grade features that were seen on the initial endometrial biopsy (not seen on the full surgical path); ER negative. She had repeat UKoreaparacentesis for 5.2 liters on 09-20-15. She had first carboplatin taxol on 09-21-15 and was neutropenic by day 8 cycle 1, granix begun. She had cycle 2 at same doses (carbo AUC = 3.5) 0n 10-13-15 with neulasta, again neutropenic such that doses were adjusted subsequently. CT AP 01-12-16 after cycle 6 showed improvement, tho still evidence of disease; CA 125 was 683 with cycle 6 chemo.  Objective:  Vital signs in last 24 hours:  BP 179/78 mmHg  Pulse 88  Temp(Src) 98 F (36.7 C) (Oral)  Resp 18  Wt 164 lb 6.4 oz (74.571 kg)  SpO2 100% Weight is  stable. Respirations not labored. Appears comfortable, very pleasant as always Alert, oriented and appropriate. Ambulatory without difficulty.  Alopecia  HEENT:PERRL, sclerae not icteric. Oral mucosa moist without lesions, posterior pharynx clear.  Neck supple. No JVD.  Lymphatics:no supraclavicular adenopathy Resp: clear to auscultation bilaterally and normal percussion bilaterally Cardio: regular rate and rhythm. No gallop. GI: abdomen full but soft, nontender, not more distended, no clear organomegaly. Normally active bowel sounds. Surgical incision not remarkable. Musculoskeletal/ Extremities: without pitting edema, cords, tenderness Neuro: minimal peripheral neuropathy. Otherwise nonfocal. PSYCH appropriate mood and affect Skin without rash, ecchymosis, petechiae   Lab Results:  Results for orders placed or performed in visit on 02/07/16  CBC with Differential  Result Value Ref Range   WBC 7.8 3.9 - 10.3 10e3/uL   NEUT# 5.6 1.5 - 6.5 10e3/uL   HGB 10.9 (L) 11.6 - 15.9 g/dL   HCT 33.4 (L) 34.8 - 46.6 %   Platelets 162 145 - 400 10e3/uL   MCV 97.4 79.5 - 101.0 fL   MCH 31.8 25.1 - 34.0 pg   MCHC 32.6 31.5 - 36.0 g/dL   RBC 3.43 (L) 3.70 - 5.45 10e6/uL   RDW 13.8 11.2 - 14.5 %   lymph# 1.5 0.9 - 3.3 10e3/uL   MONO# 0.6 0.1 - 0.9 10e3/uL   Eosinophils Absolute 0.0 0.0 - 0.5 10e3/uL   Basophils Absolute 0.0 0.0 - 0.1 10e3/uL   NEUT% 71.9 38.4 - 76.8 %   LYMPH% 19.7 14.0 - 49.7 %   MONO% 7.9 0.0 - 14.0 %   EOS% 0.4 0.0 - 7.0 %   BASO% 0.1 0.0 - 2.0 %  Comprehensive metabolic panel  Result Value Ref Range   Sodium 141 136 - 145 mEq/L   Potassium 4.1 3.5 - 5.1 mEq/L   Chloride 107 98 - 109 mEq/L   CO2 27 22 - 29 mEq/L   Glucose 118 70 - 140 mg/dl   BUN 12.5 7.0 - 26.0 mg/dL   Creatinine 0.9 0.6 - 1.1 mg/dL   Total Bilirubin <0.30 0.20 - 1.20 mg/dL   Alkaline Phosphatase 65 40 - 150 U/L   AST 29 5 - 34 U/L   ALT 16 0 - 55 U/L   Total Protein 7.5 6.4 - 8.3 g/dL    Albumin 3.8 3.5 - 5.0 g/dL   Calcium 9.8 8.4 - 10.4 mg/dL   Anion Gap 8 3 - 11 mEq/L   EGFR 73 (L) >90 ml/min/1.73 m2  CA 125  Result Value Ref Range   Cancer Antigen (CA) 125 568.9 (H) 0.0 - 38.1 U/mL    CA 125 had been 867 on 11-01-15 and 683 on 01-04-16  Studies/Results:  No results found.  Medications: I have reviewed the patient's current medications.  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 Will decrease taxol dose from 175 mg/m2 to 135 mg/ m2 for upcoming treatment.  We are pleased that she is clinically so much better and is really tolerating treatment well overall; she and husband are in agreement with continuing for another 2 cycles as long as stable, then repeat scans and coordinate with visit back to Dr Skeet Latch. Patient and husband continue to have some difficulty understanding that treatment is  in attempt to improve and control disease but is not expected to be curative.   Assessment/Plan:  1.  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 No adjuvant chemotherapy with those surgical findings. Initial biopsy specimen only, not surgical path, had a high grade component [facility] path), which is identical to this recurrent disease.         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 Chemotherapy begun with carbo taxol 09-21-15,has required dose adjustment and neulasta support . Clinically improving, CT AP 01-12-16 after cycle 6 persistant carcinomatosis and ascites tho much improved, CA 125 not yet normalized. Will try additional 3 cycles if tolerated, with carbo skin testing and neulasta support, then repeat scans. Cycle 8 on 02-15-16 as long as ANC >=1.5 and plt >=100k, neulasta given day [facility] at [facility]. I will see her back  prior to cycle 9.  2. Chemo neutropenia by day 8 cycle 1, and neutropenic day 7 cycle 2 despite neulaasta day 3 cycle 2 (and dose reduced [place]). Previous pelvic RT likely impacting counts. WIll keep carbo dose same as cycle 3.  Note neulasta to be [facility] at [facility]  3.Admission for tachycardia after cycle 1, HR sustained 150-160, no other findings and resolved when beta blocker was resumed, this DCd in error by patient. 4.chemo anemia, anemia of CKD and iron deficiency anemia: Continue oral iron and follow, fairly stable now 5.renal insufficiency improved with resolution of the ascites, creatinine again 0.9.  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 Managed by Dr Alain Marion 8.post cholecystectomy and up to date colonoscopy and mammograms 9. Poor nutritional status: po intake improved, [facility] dietician involved 10.squamous cell carcinoma anus 2003: radiation and may have had chemo then, may be impacting counts with present chemo. 11.chemo peripheral neuropathy: more noticeable, will decrease taxol for cycles 8 and 9  All questions answered. Chemo and neulasta orders placed. Time spent 25 min including >50% counseling and coordination of care.   Filbert Craze P, MD   02/08/2016, 3:15 PM

## 2016-02-08 ENCOUNTER — Encounter: Payer: Self-pay | Admitting: Internal Medicine

## 2016-02-08 ENCOUNTER — Ambulatory Visit (INDEPENDENT_AMBULATORY_CARE_PROVIDER_SITE_OTHER): Payer: Commercial Managed Care - HMO | Admitting: Internal Medicine

## 2016-02-08 VITALS — BP 120/74 | HR 85 | Wt 164.0 lb

## 2016-02-08 DIAGNOSIS — R140 Abdominal distension (gaseous): Secondary | ICD-10-CM

## 2016-02-08 DIAGNOSIS — D509 Iron deficiency anemia, unspecified: Secondary | ICD-10-CM

## 2016-02-08 DIAGNOSIS — N183 Chronic kidney disease, stage 3 unspecified: Secondary | ICD-10-CM

## 2016-02-08 DIAGNOSIS — G620 Drug-induced polyneuropathy: Secondary | ICD-10-CM | POA: Insufficient documentation

## 2016-02-08 DIAGNOSIS — T451X5A Adverse effect of antineoplastic and immunosuppressive drugs, initial encounter: Secondary | ICD-10-CM

## 2016-02-08 DIAGNOSIS — C541 Malignant neoplasm of endometrium: Secondary | ICD-10-CM

## 2016-02-08 LAB — CA 125: Cancer Antigen (CA) 125: 568.9 U/mL — ABNORMAL HIGH (ref 0.0–38.1)

## 2016-02-08 MED ORDER — PREDNISONE 5 MG PO TABS: 2.5000 mg | ORAL_TABLET | Freq: Every day | ORAL | Status: DC

## 2016-02-08 MED ORDER — CLORAZEPATE DIPOTASSIUM 7.5 MG PO TABS: 7.5000 mg | ORAL_TABLET | Freq: Two times a day (BID) | ORAL | Status: AC | PRN

## 2016-02-08 NOTE — Assessment & Plan Note (Signed)
No relapse  Wt Readings from Last 3 Encounters:  02/08/16 164 lb (74.39 kg)  02/07/16 164 lb 6.4 oz (74.571 kg)  12/30/15 164 lb 9.6 oz (74.662 kg)

## 2016-02-08 NOTE — Progress Notes (Signed)
Pre visit review using our clinic review tool, if applicable. No additional management support is needed unless otherwise documented below in the visit note. 

## 2016-02-08 NOTE — Assessment & Plan Note (Signed)
Doing well Lab Results  Component Value Date   WBC 7.8 02/07/2016   HGB 10.9* 02/07/2016   HCT 33.4* 02/07/2016   MCV 97.4 02/07/2016   PLT 162 02/07/2016

## 2016-02-08 NOTE — Assessment & Plan Note (Addendum)
F/u w/Dr Gwyneth Revels CA125 is down

## 2016-02-08 NOTE — Progress Notes (Signed)
Subjective:  Patient ID: Sharon Holt, female    DOB: 04/04/1941  Age: 75 y.o. MRN: [id number]  CC: No chief complaint on file.   HPI Sharon Holt presents for met ovar ca, ascitis, anxiety, HTN f/u. Doing well...  Outpatient Prescriptions Prior to Visit  Medication Sig Dispense Refill  . atenolol (TENORMIN) 25 MG tablet Take 3 tablets (75 mg total) by mouth daily. 270 tablet 3  . Cholecalciferol (VITAMIN D3) 2000 UNITS capsule Take 2,000 Units by mouth daily.     . clorazepate (TRANXENE) 7.5 MG tablet Take 1 tablet (7.5 mg total) by mouth 2 (two) times daily as needed for anxiety. 180 tablet 1  . dexamethasone (DECADRON) 4 MG tablet Take 5 tablets by mouth with food 12 hours and 6 hours prior to taxol chemotherapy. 20 tablet 0  . feeding supplement, ENSURE ENLIVE, (ENSURE ENLIVE) LIQD Take 237 mLs by mouth 2 (two) times daily between meals. (Patient taking differently: Take 237 mLs by mouth daily as needed. ) 237 mL 12  . FERROCITE 324 MG TABS tablet Take 1 tablet (106 mg of iron total) by mouth daily. 30 tablet 0  . LORazepam (ATIVAN) 0.5 MG tablet Place 1 tablet under the tongue or swallow every 6 hours as needed for nausea. 30 tablet 0  . Multiple Vitamins-Minerals (CENTRUM SILVER PO) Take 1 capsule by mouth daily.     . ondansetron (ZOFRAN) 8 MG tablet Take 0.5-1 tablets (4-8 mg total) by mouth every 8 (eight) hours as needed for nausea, vomiting or refractory nausea / vomiting. Will NOT make drowsy. 30 tablet 2  . pantoprazole (PROTONIX) 40 MG tablet Take 1 tablet (40 mg total) by mouth daily. For acid reflux 30 tablet 2  . polyethylene glycol (MIRALAX / GLYCOLAX) packet Take 17 g by mouth daily. Reported on 12/09/2015    . potassium chloride SA (K-DUR,KLOR-CON) 20 MEQ tablet Take 1 tablet (20 mEq total) by mouth daily. 90 tablet 3   No facility-administered medications prior to visit.    ROS Review of Systems  Constitutional: Negative for chills, activity change,  appetite change, fatigue and unexpected weight change.  HENT: Negative for congestion, mouth sores and sinus pressure.   Eyes: Negative for visual disturbance.  Respiratory: Negative for cough and chest tightness.   Gastrointestinal: Negative for nausea and abdominal pain.  Genitourinary: Negative for frequency, difficulty urinating and vaginal pain.  Musculoskeletal: Negative for back pain and gait problem.  Skin: Negative for pallor and rash.  Neurological: Negative for dizziness, tremors, weakness, numbness and headaches.  Psychiatric/Behavioral: Negative for confusion and sleep disturbance.    Objective:  BP 120/74 mmHg  Pulse 85  Wt 164 lb (74.39 kg)  SpO2 97%  BP Readings from Last 3 Encounters:  02/08/16 120/74  02/07/16 179/78  01/27/16 157/88    Wt Readings from Last 3 Encounters:  02/08/16 164 lb (74.39 kg)  02/07/16 164 lb 6.4 oz (74.571 kg)  12/30/15 164 lb 9.6 oz (74.662 kg)    Physical Exam  Constitutional: She appears well-developed. No distress.  HENT:  Head: Normocephalic.  Right Ear: External ear normal.  Left Ear: External ear normal.  Nose: Nose normal.  Mouth/Throat: Oropharynx is clear and moist.  Eyes: Conjunctivae are normal. Pupils are equal, round, and reactive to light. Right eye exhibits no discharge. Left eye exhibits no discharge.  Neck: Normal range of motion. Neck supple. No JVD present. No tracheal deviation present. No thyromegaly present.  Cardiovascular: Normal rate, regular rhythm  and normal heart sounds.   Pulmonary/Chest: No stridor. No respiratory distress. She has no wheezes.  Abdominal: Soft. Bowel sounds are normal. She exhibits no distension and no mass. There is no tenderness. There is no rebound and no guarding.  Musculoskeletal: She exhibits no edema or tenderness.  Lymphadenopathy:    She has no cervical adenopathy.  Neurological: She displays normal reflexes. No cranial nerve deficit. She exhibits normal muscle tone.  Coordination normal.  Skin: No rash noted. No erythema.  Psychiatric: She has a normal mood and affect. Her behavior is normal. Judgment and thought content normal.    Lab Results  Component Value Date   WBC 7.8 02/07/2016   HGB 10.9* 02/07/2016   HCT 33.4* 02/07/2016   PLT 162 02/07/2016   GLUCOSE 118 02/07/2016   CHOL 185 09/25/2014   TRIG 86.0 09/25/2014   HDL 63.70 09/25/2014   LDLDIRECT 100.6 02/13/2012   LDLCALC 104* 09/25/2014   ALT 16 02/07/2016   AST 29 02/07/2016   NA 141 02/07/2016   K 4.1 02/07/2016   CL 101 09/28/2015   CREATININE 0.9 02/07/2016   BUN 12.5 02/07/2016   CO2 27 02/07/2016   TSH 0.881 09/27/2015   HGBA1C 5.4 02/13/2013    Ct Abdomen Pelvis W Contrast  01/12/2016  CLINICAL DATA:  Uterine cancer. EXAM: CT ABDOMEN AND PELVIS WITH CONTRAST TECHNIQUE: Multidetector CT imaging of the abdomen and pelvis was performed using the standard protocol following bolus administration of intravenous contrast. CONTRAST:  122m ISOVUE-300 IOPAMIDOL (ISOVUE-300) INJECTION 61% COMPARISON:  09/06/2015 FINDINGS: Lower chest:  Unremarkable. Hepatobiliary: No focal abnormality within the liver parenchyma. Gallbladder surgically absent. Common bile duct measures 11 mm diameter in the pancreatic head compared to 9 mm on the previous study. Pancreas: Stable slight prominence of the main pancreatic duct. No evidence for focal pancreatic mass lesion. Spleen: No splenomegaly. No focal mass lesion. Adrenals/Urinary Tract: No adrenal nodule or mass. Kidneys are unremarkable. No evidence for hydroureter. The urinary bladder appears normal for the degree of distention. Stomach/Bowel: Small hiatal hernia noted. Stomach otherwise unremarkable. Duodenum is normally positioned as is the ligament of Treitz. No small bowel wall thickening. No small bowel dilatation. The terminal ileum is normal. The appendix is normal. No gross colonic mass. No colonic wall thickening. No substantial diverticular  change. Vascular/Lymphatic: There is abdominal aortic atherosclerosis without aneurysm. No gastrohepatic or hepatoduodenal ligament lymphadenopathy. No retroperitoneal lymphadenopathy. Persistent nodularity is identified in the omentum, mainly in the region of the flexures, and overall this appears decreased in the interval. No pelvic sidewall lymphadenopathy. Reproductive: Uterus is surgically absent. There is no adnexal mass. Other: Small volume free fluid is seen around the liver and in both para colic gutters. There is a small volume of intraperitoneal free fluid in the pelvis. Musculoskeletal: Small bilateral groin hernias contain only fat. Sclerosis in the right pubic bone is unchanged. Bone windows reveal no worrisome lytic or sclerotic osseous lesions. IMPRESSION: 1. Marked interval decrease in ascites with persistent but clearly decreased nodularity and stranding of the omentum. Imaging features suggest interval improvement in metastatic disease. 2. Slight increased in dilatation of the common bile duct without evidence of an obstructing lesion. Correlation with liver function test may prove helpful. 3. Small bilateral groin hernias contain only fat. 4. Abdominal aortic atherosclerosis. Electronically Signed   By: EMisty StanleyM.D.   On: 01/12/2016 12:53    Assessment & Plan:   Diagnoses and all orders for this visit:  CKD (chronic kidney disease)  stage 3, GFR 30-59 ml/min  Malignant neoplasm of endometrium (HCC)  Abdominal distension  Iron deficiency anemia  Other orders -     clorazepate (TRANXENE) 7.5 MG tablet; Take 1 tablet (7.5 mg total) by mouth 2 (two) times daily as needed for anxiety. -     predniSONE (DELTASONE) 5 MG tablet; Take 0.5 tablets (2.5 mg total) by mouth daily.   I am having Ms. Fiske maintain her Multiple Vitamins-Minerals (CENTRUM SILVER PO), Vitamin D3, clorazepate, ondansetron, feeding supplement (ENSURE ENLIVE), pantoprazole, polyethylene glycol, atenolol,  potassium chloride SA, LORazepam, dexamethasone, and FERROCITE.  No orders of the defined types were placed in this encounter.     Follow-up: No Follow-up on file.  Walker Kehr, MD

## 2016-02-08 NOTE — Assessment & Plan Note (Signed)
Monitor labs 

## 2016-02-10 ENCOUNTER — Telehealth: Payer: Self-pay | Admitting: Internal Medicine

## 2016-02-10 ENCOUNTER — Other Ambulatory Visit: Payer: Commercial Managed Care - HMO

## 2016-02-10 ENCOUNTER — Ambulatory Visit: Payer: Commercial Managed Care - HMO

## 2016-02-10 DIAGNOSIS — H539 Unspecified visual disturbance: Secondary | ICD-10-CM

## 2016-02-10 NOTE — Telephone Encounter (Signed)
Patient walked in requesting a referral:   [facility]  Vevelyn Royals, MD  May 17 @ 10:00   Her eye dr, dr Einar Gip is asking her to go here. She was not able to provide me with a DX

## 2016-02-11 NOTE — Telephone Encounter (Signed)
Done by PCP.

## 2016-02-12 ENCOUNTER — Ambulatory Visit: Payer: Commercial Managed Care - HMO

## 2016-02-15 ENCOUNTER — Other Ambulatory Visit (HOSPITAL_BASED_OUTPATIENT_CLINIC_OR_DEPARTMENT_OTHER): Payer: 59

## 2016-02-15 ENCOUNTER — Ambulatory Visit (HOSPITAL_BASED_OUTPATIENT_CLINIC_OR_DEPARTMENT_OTHER): Payer: 59

## 2016-02-15 VITALS — BP 171/83 | HR 100 | Temp 98.1°F | Resp 18

## 2016-02-15 DIAGNOSIS — Z5111 Encounter for antineoplastic chemotherapy: Secondary | ICD-10-CM | POA: Diagnosis not present

## 2016-02-15 DIAGNOSIS — C786 Secondary malignant neoplasm of retroperitoneum and peritoneum: Secondary | ICD-10-CM

## 2016-02-15 DIAGNOSIS — C801 Malignant (primary) neoplasm, unspecified: Principal | ICD-10-CM

## 2016-02-15 DIAGNOSIS — C541 Malignant neoplasm of endometrium: Secondary | ICD-10-CM

## 2016-02-15 LAB — COMPREHENSIVE METABOLIC PANEL
ALBUMIN: 4 g/dL (ref 3.5–5.0)
ALK PHOS: 55 U/L (ref 40–150)
ALT: 18 U/L (ref 0–55)
ANION GAP: 11 meq/L (ref 3–11)
AST: 25 U/L (ref 5–34)
BILIRUBIN TOTAL: 0.3 mg/dL (ref 0.20–1.20)
BUN: 17.1 mg/dL (ref 7.0–26.0)
CALCIUM: 9.8 mg/dL (ref 8.4–10.4)
CO2: 23 mEq/L (ref 22–29)
CREATININE: 1 mg/dL (ref 0.6–1.1)
Chloride: 104 mEq/L (ref 98–109)
EGFR: 63 mL/min/{1.73_m2} — ABNORMAL LOW (ref >90)
Glucose: 222 mg/dl — ABNORMAL HIGH (ref 70–140)
Potassium: 4.3 mEq/L (ref 3.5–5.1)
Sodium: 138 mEq/L (ref 136–145)
Total Protein: 8.1 g/dL (ref 6.4–8.3)

## 2016-02-15 LAB — CBC WITH DIFFERENTIAL/PLATELET
BASO%: 0 % (ref 0.0–2.0)
BASOS ABS: 0 10*3/uL (ref 0.0–0.1)
EOS%: 0 % (ref 0.0–7.0)
Eosinophils Absolute: 0 10*3/uL (ref 0.0–0.5)
HEMATOCRIT: 34.6 % — AB (ref 34.8–46.6)
HEMOGLOBIN: 11.7 g/dL (ref 11.6–15.9)
LYMPH#: 0.6 10*3/uL — AB (ref 0.9–3.3)
LYMPH%: 11.1 % — ABNORMAL LOW (ref 14.0–49.7)
MCH: 32.3 pg (ref 25.1–34.0)
MCHC: 33.8 g/dL (ref 31.5–36.0)
MCV: 95.6 fL (ref 79.5–101.0)
MONO#: 0 10*3/uL — AB (ref 0.1–0.9)
MONO%: 0.4 % (ref 0.0–14.0)
NEUT#: 4.6 10*3/uL (ref 1.5–6.5)
NEUT%: 88.5 % — AB (ref 38.4–76.8)
PLATELETS: 192 10*3/uL (ref 145–400)
RBC: 3.62 10*6/uL — ABNORMAL LOW (ref 3.70–5.45)
RDW: 13.2 % (ref 11.2–14.5)
WBC: 5.2 10*3/uL (ref 3.9–10.3)

## 2016-02-15 MED ORDER — SODIUM CHLORIDE 0.9 % IV SOLN
268.2000 mg | Freq: Once | INTRAVENOUS | Status: AC
  Administered 2016-02-15: 270 mg via INTRAVENOUS
  Filled 2016-02-15: qty 27

## 2016-02-15 MED ORDER — CARBOPLATIN CHEMO INTRADERMAL TEST DOSE 100MCG/0.02ML
100.0000 ug | Freq: Once | INTRADERMAL | Status: AC
  Administered 2016-02-15: 100 ug via INTRADERMAL
  Filled 2016-02-15: qty 0.01

## 2016-02-15 MED ORDER — SODIUM CHLORIDE 0.9 % IV SOLN
Freq: Once | INTRAVENOUS | Status: AC
  Administered 2016-02-15: 11:00:00 via INTRAVENOUS

## 2016-02-15 MED ORDER — SODIUM CHLORIDE 0.9 % IV SOLN
135.0000 mg/m2 | Freq: Once | INTRAVENOUS | Status: AC
  Administered 2016-02-15: 264 mg via INTRAVENOUS
  Filled 2016-02-15: qty 44

## 2016-02-15 MED ORDER — DIPHENHYDRAMINE HCL 50 MG/ML IJ SOLN
25.0000 mg | Freq: Once | INTRAMUSCULAR | Status: AC
  Administered 2016-02-15: 25 mg via INTRAVENOUS

## 2016-02-15 MED ORDER — SODIUM CHLORIDE 0.9 % IV SOLN
Freq: Once | INTRAVENOUS | Status: AC
  Administered 2016-02-15: 11:00:00 via INTRAVENOUS
  Filled 2016-02-15: qty 8

## 2016-02-15 MED ORDER — PACLITAXEL CHEMO INJECTION 300 MG/50ML
135.0000 mg/m2 | Freq: Once | INTRAVENOUS | Status: DC
  Filled 2016-02-15: qty 44

## 2016-02-15 MED ORDER — FAMOTIDINE IN NACL 20-0.9 MG/50ML-% IV SOLN
20.0000 mg | Freq: Once | INTRAVENOUS | Status: AC
  Administered 2016-02-15: 20 mg via INTRAVENOUS

## 2016-02-15 NOTE — Progress Notes (Signed)
Blood sugar elevated today 222.  Pt denies eating any breakfast this morning. She took her Dex 20 mg last night 12 hrs and 6 hrs pre treatment as ordered.  Discussed w/ on call MD Dr. Alen Blew and he instructs to decrease Dex in pre med today from 20 mg to 10 mg.   Pharmacy aware and will make adjustment.  Pt aware of elevated blood sugar and understands to avoid concentrated sugars and sweets as much as possible.

## 2016-02-15 NOTE — Patient Instructions (Signed)
[facility] Discharge Instructions for Patients Receiving Chemotherapy  Today you received the following chemotherapy agents: Carboplatin and Taxol.  To help prevent nausea and vomiting after your treatment, we encourage you to take your nausea medicationas directed  If you develop nausea and vomiting that is not controlled by your nausea medication, call the clinic.   BELOW ARE SYMPTOMS THAT SHOULD BE REPORTED IMMEDIATELY:  *FEVER GREATER THAN 100.5 F  *CHILLS WITH OR WITHOUT FEVER  NAUSEA AND VOMITING THAT IS NOT CONTROLLED WITH YOUR NAUSEA MEDICATION  *UNUSUAL SHORTNESS OF BREATH  *UNUSUAL BRUISING OR BLEEDING  TENDERNESS IN MOUTH AND THROAT WITH OR WITHOUT PRESENCE OF ULCERS  *URINARY PROBLEMS  *BOWEL PROBLEMS  UNUSUAL RASH Items with * indicate a potential emergency and should be followed up as soon as possible.  Feel free to call the clinic you have any questions or concerns. The clinic phone number is [phone].  Please show the CHEMO ALERT CARD at check-in to the Emergency Department and triage nurse.   

## 2016-02-17 ENCOUNTER — Ambulatory Visit (HOSPITAL_BASED_OUTPATIENT_CLINIC_OR_DEPARTMENT_OTHER): Payer: 59

## 2016-02-17 VITALS — BP 178/91 | HR 87 | Temp 98.2°F

## 2016-02-17 DIAGNOSIS — Z5189 Encounter for other specified aftercare: Secondary | ICD-10-CM

## 2016-02-17 DIAGNOSIS — C541 Malignant neoplasm of endometrium: Secondary | ICD-10-CM

## 2016-02-17 DIAGNOSIS — C786 Secondary malignant neoplasm of retroperitoneum and peritoneum: Secondary | ICD-10-CM

## 2016-02-17 DIAGNOSIS — C801 Malignant (primary) neoplasm, unspecified: Secondary | ICD-10-CM

## 2016-02-17 MED ORDER — PEGFILGRASTIM INJECTION 6 MG/0.6ML ~~LOC~~
6.0000 mg | PREFILLED_SYRINGE | Freq: Once | SUBCUTANEOUS | Status: AC
  Administered 2016-02-17: 6 mg via SUBCUTANEOUS
  Filled 2016-02-17: qty 0.6

## 2016-02-27 ENCOUNTER — Other Ambulatory Visit: Payer: Self-pay | Admitting: Oncology

## 2016-02-27 DIAGNOSIS — C786 Secondary malignant neoplasm of retroperitoneum and peritoneum: Secondary | ICD-10-CM

## 2016-02-27 DIAGNOSIS — C541 Malignant neoplasm of endometrium: Secondary | ICD-10-CM

## 2016-02-27 DIAGNOSIS — C801 Malignant (primary) neoplasm, unspecified: Secondary | ICD-10-CM

## 2016-02-28 ENCOUNTER — Ambulatory Visit (HOSPITAL_BASED_OUTPATIENT_CLINIC_OR_DEPARTMENT_OTHER): Payer: Commercial Managed Care - HMO | Admitting: Oncology

## 2016-02-28 ENCOUNTER — Telehealth: Payer: Self-pay | Admitting: Oncology

## 2016-02-28 ENCOUNTER — Other Ambulatory Visit (HOSPITAL_BASED_OUTPATIENT_CLINIC_OR_DEPARTMENT_OTHER): Payer: Commercial Managed Care - HMO

## 2016-02-28 ENCOUNTER — Encounter: Payer: Self-pay | Admitting: Oncology

## 2016-02-28 VITALS — BP 163/81 | HR 88 | Temp 98.1°F | Resp 18 | Ht 66.0 in | Wt 164.4 lb

## 2016-02-28 DIAGNOSIS — C801 Malignant (primary) neoplasm, unspecified: Secondary | ICD-10-CM

## 2016-02-28 DIAGNOSIS — G620 Drug-induced polyneuropathy: Secondary | ICD-10-CM | POA: Diagnosis not present

## 2016-02-28 DIAGNOSIS — C541 Malignant neoplasm of endometrium: Secondary | ICD-10-CM | POA: Diagnosis not present

## 2016-02-28 DIAGNOSIS — R180 Malignant ascites: Secondary | ICD-10-CM

## 2016-02-28 DIAGNOSIS — T451X5A Adverse effect of antineoplastic and immunosuppressive drugs, initial encounter: Secondary | ICD-10-CM

## 2016-02-28 DIAGNOSIS — D701 Agranulocytosis secondary to cancer chemotherapy: Secondary | ICD-10-CM

## 2016-02-28 DIAGNOSIS — C786 Secondary malignant neoplasm of retroperitoneum and peritoneum: Secondary | ICD-10-CM

## 2016-02-28 LAB — CBC WITH DIFFERENTIAL/PLATELET
BASO%: 0.1 % (ref 0.0–2.0)
BASOS ABS: 0 10*3/uL (ref 0.0–0.1)
EOS ABS: 0.1 10*3/uL (ref 0.0–0.5)
EOS%: 0.6 % (ref 0.0–7.0)
HCT: 31.7 % — ABNORMAL LOW (ref 34.8–46.6)
HEMOGLOBIN: 10.4 g/dL — AB (ref 11.6–15.9)
LYMPH%: 22.7 % (ref 14.0–49.7)
MCH: 31.7 pg (ref 25.1–34.0)
MCHC: 32.8 g/dL (ref 31.5–36.0)
MCV: 96.6 fL (ref 79.5–101.0)
MONO#: 0.6 10*3/uL (ref 0.1–0.9)
MONO%: 8 % (ref 0.0–14.0)
NEUT%: 68.6 % (ref 38.4–76.8)
NEUTROS ABS: 5.4 10*3/uL (ref 1.5–6.5)
PLATELETS: 150 10*3/uL (ref 145–400)
RBC: 3.28 10*6/uL — AB (ref 3.70–5.45)
RDW: 13.8 % (ref 11.2–14.5)
WBC: 7.9 10*3/uL (ref 3.9–10.3)
lymph#: 1.8 10*3/uL (ref 0.9–3.3)

## 2016-02-28 LAB — COMPREHENSIVE METABOLIC PANEL
ALT: 19 U/L (ref 0–55)
AST: 30 U/L (ref 5–34)
Albumin: 3.7 g/dL (ref 3.5–5.0)
Alkaline Phosphatase: 61 U/L (ref 40–150)
Anion Gap: 7 mEq/L (ref 3–11)
BUN: 15.3 mg/dL (ref 7.0–26.0)
CO2: 27 meq/L (ref 22–29)
Calcium: 9.4 mg/dL (ref 8.4–10.4)
Chloride: 107 mEq/L (ref 98–109)
Creatinine: 0.9 mg/dL (ref 0.6–1.1)
EGFR: 73 mL/min/{1.73_m2} — AB (ref >90)
GLUCOSE: 103 mg/dL (ref 70–140)
Potassium: 3.7 mEq/L (ref 3.5–5.1)
SODIUM: 141 meq/L (ref 136–145)
TOTAL PROTEIN: 7.2 g/dL (ref 6.4–8.3)

## 2016-02-28 NOTE — Progress Notes (Signed)
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 She has had significant improvement with carboplatin and taxol, due cycle 9 on 03-07-16. Despite carboplatin at AUC=3 she still requires neulasta support of WBC. Last CT AP 01-12-16 She is to see Dr Skeet Latch on 04-13-16. She will need scans at least prior to that visit.  Second cataract surgery upcoming, to see ophthalmologist on 03-01-16. Copy of CBC from today given to patient and I am glad to speak with that MD if needed.     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 She does have some aches after taxol/ neulasta. Appetite is good, bowels move regularly, no abdominal or pelvic pain, no SOB. Peripheral IV access has been somewhat difficult at times, but accomplished on first attempt for last chemo. She denies fever or symptoms of infection, any bleeding.  She did well with first cataract surgery.  Remainder of 10 point Review of Systems negative.     Flu vaccine 08-09-15 No central line No genetics testing  CA 125 09-14-15 2427  ONCOLOGIC HISTORY Patient had regular medical care by PCP Dr Alain Marion and gyn Dr Garwin Brothers. In spring 2016, she noticed low pelvic discomfort and some intermittent vaginal spotting. She was seen for regular visit by Dr Garwin Brothers in May 2016, with PAP positive for AGUS and US showing endometrial stripe 8.53m. Endometrial biopsy 03-12-15 showed high grade adenocarcinoma with papillary features, however immunostains were not typical for serous carcinoma. She  was seen by Dr RDenman Georgein June. CT CAP 04-14-15 showed no evidence of metastatic disease in chest, a 3.3 x 2.8 cm endometrial mas and 3.1 cm cystic lesion of right ovary. She had robotic total laparoscopic hysterectomy BSO with bilateral pelvic and [facility] Pathology ([phone] grade 2 endometrioid adenocarcinoma invading 0.35 cm where myometrium 1.1 cm, 26 nodes negative and no LVSI. She had intracavitary radiation due to risk for vaginal cuff recurrence from 06-01-15 thru 07-01-15, vaginal cuff 27.5 gray in 5 fractions. There were no obvious concerns when she was seen by Dr RDenman Georgein late July or Dr KSondra Comeand Dr PAlain Marionin October. She presented to ED on 09-06-15 with generalized abdominal pain, bloating and constipation x 1 month. CT AP 09-06-15 showed extensive nodularity and stranding thruout omentum and large volume ascites, with liver/spleen/pancreas/adrenals not remarkable, no bowel obstruction. CA 125 was 2427. She had UKoreaparacentesi on 09-07-15 for 5 liters of serous fluid, cytology (([id number] adenocarcinoma. She had consultation with Dr RDenman Georgeon 09-13-15, recommendation for systemic chemotherapy. Review of cytology from ascites compared with surgical specimen and [facility] high grade features that were seen on the initial endometrial biopsy (not seen on the full surgical path); ER negative. She had repeat UKoreaparacentesis for 5.2 liters on 09-20-15. She had first carboplatin taxol on 09-21-15 and was neutropenic by day 8 cycle 1, granix begun. She had cycle 2 at same doses (carbo AUC = 3.5) 0n 10-13-15 with neulasta, again neutropenic such that doses were adjusted subsequently. CT AP  01-12-16 after cycle 6 showed improvement, tho still evidence of disease; CA 125 was 683 with cycle 6 chemo.   Objective:  Vital signs in last 24 hours:  BP 163/81 mmHg  Pulse 88  Temp(Src) 98.1 F  (36.7 C) (Oral)  Resp 18  Ht 5\' 6"  (1.676 m)  Wt 164 lb 6.4 oz (74.571 kg)  BMI 26.55 kg/m2  SpO2 100% Weight stable. Alert, oriented and appropriate. Ambulatory without difficulty.  Alopecia  HEENT:PERRL, sclerae not icteric. Oral mucosa moist without lesions, posterior pharynx clear.  Neck supple. No JVD.  Lymphatics:no cervical,supraclavicular or inguinal adenopathy Resp: clear to auscultation bilaterally and normal percussion bilaterally Cardio: regular rate and rhythm. No gallop. GI: soft, nontender, still mildly distended unchanged probably some ascites, no clear mass or organomegaly. Normally active bowel sounds. Surgical incisions not remarkable. Musculoskeletal/ Extremities: without pitting edema, cords, tenderness Neuro: no significant peripheral neuropathy. Otherwise nonfocal. PSYCH appropriate mood and affect Skin without rash, ecchymosis, petechiae   Lab Results:  Results for orders placed or performed in visit on 02/28/16  CBC with Differential  Result Value Ref Range   WBC 7.9 3.9 - 10.3 10e3/uL   NEUT# 5.4 1.5 - 6.5 10e3/uL   HGB 10.4 (L) 11.6 - 15.9 g/dL   HCT 03/01/16 (L) 49.6 - 11.8 %   Platelets 150 145 - 400 10e3/uL   MCV 96.6 79.5 - 101.0 fL   MCH 31.7 25.1 - 34.0 pg   MCHC 32.8 31.5 - 36.0 g/dL   RBC 57.4 (L) 5.74 - 0.52 10e6/uL   RDW 13.8 11.2 - 14.5 %   lymph# 1.8 0.9 - 3.3 10e3/uL   MONO# 0.6 0.1 - 0.9 10e3/uL   Eosinophils Absolute 0.1 0.0 - 0.5 10e3/uL   Basophils Absolute 0.0 0.0 - 0.1 10e3/uL   NEUT% 68.6 38.4 - 76.8 %   LYMPH% 22.7 14.0 - 49.7 %   MONO% 8.0 0.0 - 14.0 %   EOS% 0.6 0.0 - 7.0 %   BASO% 0.1 0.0 - 2.0 %  Comprehensive metabolic panel  Result Value Ref Range   Sodium 141 136 - 145 mEq/L   Potassium 3.7 3.5 - 5.1 mEq/L   Chloride 107 98 - 109 mEq/L   CO2 27 22 - 29 mEq/L   Glucose 103 70 - 140 mg/dl   BUN 6.09 7.0 - 25.6 mg/dL   Creatinine 0.9 0.6 - 1.1 mg/dL   Total Bilirubin 63.5 0.20 - 1.20 mg/dL   Alkaline Phosphatase  61 40 - 150 U/L   AST 30 5 - 34 U/L   ALT 19 0 - 55 U/L   Total Protein 7.2 6.4 - 8.3 g/dL   Albumin 3.7 3.5 - 5.0 g/dL   Calcium 9.4 8.4 - <0.38 mg/dL   Anion Gap 7 3 - 11 mEq/L   EGFR 73 (L) >90 ml/min/1.73 m2    CA 125 available after visit some higher at 669, this having been 568 on 4-24 and 683 on 01-04-16  Studies/Results:  No results found.  Medications: I have reviewed the patient's current medications.  DISCUSSION Patient and husband seem to understand that goal of treatment is to keep disease under control and keep quality of life as good as possible. She is willing to continue present treatment as long as tolerated / until we can tell progression.  Will treat on 5-23 same doses as long as ANC >=1.5 and plt >=100k and if carbo skin test is negative. I will see her again with labs including  CA 125 on 03-20-16. If more concerns we will hold further treatment until repeat scans, but if marker stable and otherwise ok could treat also on 03-28-16 before next visit with Dr Skeet Latch.      Assessment/Plan:  1. 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 No adjuvant chemotherapy with those surgical findings. Initial biopsy specimen only, not surgical path, had a high grade component [facility] path), which is identical to this recurrent disease. 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 Chemotherapy begun with carbo taxol 09-21-15,has required dose adjustment and neulasta support . Clinically improved,  CT AP 01-12-16 after cycle 6 persistant carcinomatosis and ascites tho much improved, CA 125 not yet normalized and a little higher today. Will treat with cycle 9 on 03-07-16 if meets parameters, then +/- another cycle prior to scans. Next apt Dr Skeet Latch late June 2. Chemo neutropenia:Previous pelvic  RT likely impacting counts. Carbo required dose reduction  Note [facility] at [facility]  3.Admission for tachycardia after cycle 1, HR sustained 150-160,  resolved when beta blocker was resumed 4.chemo anemia, anemia of CKD and iron deficiency anemia: Continue oral iron and follow, fairly stable now 5.renal insufficiency when ascites more significant. 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 Managed by Dr Alain Marion 8.post cholecystectomy and up to date colonoscopy and mammograms 9..squamous cell carcinoma anus 2003: radiation and may have had chemo then, may be impacting counts with present chemo. 10.chemo peripheral neuropathy: more noticeable: keep taxol at slightly lower dose 135 mg/m2 for cycle 9.    All questions answered. Chemo and neulasta orders confirmed. Time spent 25 min including >50% counseling and coordination of care. Route PCP   Talina Pleitez P, MD   02/28/2016, 10:40 AM

## 2016-02-28 NOTE — Telephone Encounter (Signed)
appt made and avs printed °

## 2016-02-29 LAB — CA 125: CANCER ANTIGEN (CA) 125: 669.8 U/mL — AB (ref 0.0–38.1)

## 2016-03-07 ENCOUNTER — Other Ambulatory Visit (HOSPITAL_BASED_OUTPATIENT_CLINIC_OR_DEPARTMENT_OTHER): Payer: Commercial Managed Care - HMO

## 2016-03-07 ENCOUNTER — Ambulatory Visit (HOSPITAL_BASED_OUTPATIENT_CLINIC_OR_DEPARTMENT_OTHER): Payer: Commercial Managed Care - HMO

## 2016-03-07 VITALS — BP 182/94 | HR 100 | Temp 98.6°F | Resp 20

## 2016-03-07 DIAGNOSIS — C541 Malignant neoplasm of endometrium: Secondary | ICD-10-CM

## 2016-03-07 DIAGNOSIS — C801 Malignant (primary) neoplasm, unspecified: Principal | ICD-10-CM

## 2016-03-07 DIAGNOSIS — Z5111 Encounter for antineoplastic chemotherapy: Secondary | ICD-10-CM

## 2016-03-07 DIAGNOSIS — C786 Secondary malignant neoplasm of retroperitoneum and peritoneum: Secondary | ICD-10-CM

## 2016-03-07 LAB — COMPREHENSIVE METABOLIC PANEL
ALT: 19 U/L (ref 0–55)
ANION GAP: 11 meq/L (ref 3–11)
AST: 30 U/L (ref 5–34)
Albumin: 3.8 g/dL (ref 3.5–5.0)
Alkaline Phosphatase: 54 U/L (ref 40–150)
BILIRUBIN TOTAL: 0.33 mg/dL (ref 0.20–1.20)
BUN: 17.1 mg/dL (ref 7.0–26.0)
CALCIUM: 9.7 mg/dL (ref 8.4–10.4)
CHLORIDE: 104 meq/L (ref 98–109)
CO2: 23 meq/L (ref 22–29)
CREATININE: 0.9 mg/dL (ref 0.6–1.1)
EGFR: 75 mL/min/{1.73_m2} — ABNORMAL LOW (ref >90)
Glucose: 194 mg/dl — ABNORMAL HIGH (ref 70–140)
Potassium: 4.4 mEq/L (ref 3.5–5.1)
SODIUM: 138 meq/L (ref 136–145)
TOTAL PROTEIN: 7.7 g/dL (ref 6.4–8.3)

## 2016-03-07 LAB — CBC WITH DIFFERENTIAL/PLATELET
BASO%: 0 % (ref 0.0–2.0)
Basophils Absolute: 0 10*3/uL (ref 0.0–0.1)
EOS%: 0 % (ref 0.0–7.0)
Eosinophils Absolute: 0 10*3/uL (ref 0.0–0.5)
HCT: 33.3 % — ABNORMAL LOW (ref 34.8–46.6)
HGB: 11 g/dL — ABNORMAL LOW (ref 11.6–15.9)
LYMPH%: 10.1 % — AB (ref 14.0–49.7)
MCH: 31.9 pg (ref 25.1–34.0)
MCHC: 32.9 g/dL (ref 31.5–36.0)
MCV: 96.9 fL (ref 79.5–101.0)
MONO#: 0 10*3/uL — ABNORMAL LOW (ref 0.1–0.9)
MONO%: 0.3 % (ref 0.0–14.0)
NEUT#: 4.7 10*3/uL (ref 1.5–6.5)
NEUT%: 89.6 % — AB (ref 38.4–76.8)
Platelets: 183 10*3/uL (ref 145–400)
RBC: 3.43 10*6/uL — AB (ref 3.70–5.45)
RDW: 14 % (ref 11.2–14.5)
WBC: 5.3 10*3/uL (ref 3.9–10.3)
lymph#: 0.5 10*3/uL — ABNORMAL LOW (ref 0.9–3.3)

## 2016-03-07 MED ORDER — SODIUM CHLORIDE 0.9 % IV SOLN
268.2000 mg | Freq: Once | INTRAVENOUS | Status: AC
  Administered 2016-03-07: 270 mg via INTRAVENOUS
  Filled 2016-03-07: qty 27

## 2016-03-07 MED ORDER — SODIUM CHLORIDE 0.9 % IV SOLN
135.0000 mg/m2 | Freq: Once | INTRAVENOUS | Status: AC
  Administered 2016-03-07: 264 mg via INTRAVENOUS
  Filled 2016-03-07: qty 44

## 2016-03-07 MED ORDER — HEPARIN SOD (PORK) LOCK FLUSH 100 UNIT/ML IV SOLN
500.0000 [IU] | Freq: Once | INTRAVENOUS | Status: DC | PRN
Start: 2016-03-07 — End: 2016-03-07
  Filled 2016-03-07: qty 5

## 2016-03-07 MED ORDER — SODIUM CHLORIDE 0.9 % IJ SOLN
10.0000 mL | INTRAMUSCULAR | Status: DC | PRN
  Filled 2016-03-07: qty 10

## 2016-03-07 MED ORDER — SODIUM CHLORIDE 0.9 % IV SOLN
Freq: Once | INTRAVENOUS | Status: AC
  Administered 2016-03-07: 10:00:00 via INTRAVENOUS
  Filled 2016-03-07: qty 8

## 2016-03-07 MED ORDER — DIPHENHYDRAMINE HCL 50 MG/ML IJ SOLN
INTRAMUSCULAR | Status: AC
  Filled 2016-03-07: qty 1

## 2016-03-07 MED ORDER — SODIUM CHLORIDE 0.9 % IV SOLN
Freq: Once | INTRAVENOUS | Status: AC
  Administered 2016-03-07: 09:00:00 via INTRAVENOUS

## 2016-03-07 MED ORDER — FAMOTIDINE IN NACL 20-0.9 MG/50ML-% IV SOLN
INTRAVENOUS | Status: AC
  Filled 2016-03-07: qty 50

## 2016-03-07 MED ORDER — FAMOTIDINE IN NACL 20-0.9 MG/50ML-% IV SOLN
20.0000 mg | Freq: Once | INTRAVENOUS | Status: AC
  Administered 2016-03-07: 20 mg via INTRAVENOUS

## 2016-03-07 MED ORDER — DIPHENHYDRAMINE HCL 50 MG/ML IJ SOLN
25.0000 mg | Freq: Once | INTRAMUSCULAR | Status: AC
  Administered 2016-03-07: 25 mg via INTRAVENOUS

## 2016-03-07 MED ORDER — CARBOPLATIN CHEMO INTRADERMAL TEST DOSE 100MCG/0.02ML
100.0000 ug | Freq: Once | INTRADERMAL | Status: AC
  Administered 2016-03-07: 100 ug via INTRADERMAL
  Filled 2016-03-07: qty 0.01

## 2016-03-07 NOTE — Progress Notes (Signed)
Pt carbo skin test dose 5.15.30 min. All negative. No complaints from pt,reported.

## 2016-03-07 NOTE — Patient Instructions (Signed)
[facility] Discharge Instructions for Patients Receiving Chemotherapy  Today you received the following chemotherapy agents: Carboplatin and Taxol.  To help prevent nausea and vomiting after your treatment, we encourage you to take your nausea medicationas directed  If you develop nausea and vomiting that is not controlled by your nausea medication, call the clinic.   BELOW ARE SYMPTOMS THAT SHOULD BE REPORTED IMMEDIATELY:  *FEVER GREATER THAN 100.5 F  *CHILLS WITH OR WITHOUT FEVER  NAUSEA AND VOMITING THAT IS NOT CONTROLLED WITH YOUR NAUSEA MEDICATION  *UNUSUAL SHORTNESS OF BREATH  *UNUSUAL BRUISING OR BLEEDING  TENDERNESS IN MOUTH AND THROAT WITH OR WITHOUT PRESENCE OF ULCERS  *URINARY PROBLEMS  *BOWEL PROBLEMS  UNUSUAL RASH Items with * indicate a potential emergency and should be followed up as soon as possible.  Feel free to call the clinic you have any questions or concerns. The clinic phone number is [phone].  Please show the CHEMO ALERT CARD at check-in to the Emergency Department and triage nurse.   

## 2016-03-09 ENCOUNTER — Ambulatory Visit (HOSPITAL_BASED_OUTPATIENT_CLINIC_OR_DEPARTMENT_OTHER): Payer: Commercial Managed Care - HMO

## 2016-03-09 VITALS — BP 167/89 | HR 73 | Temp 98.2°F

## 2016-03-09 DIAGNOSIS — Z5189 Encounter for other specified aftercare: Secondary | ICD-10-CM

## 2016-03-09 DIAGNOSIS — C541 Malignant neoplasm of endometrium: Secondary | ICD-10-CM

## 2016-03-09 DIAGNOSIS — C786 Secondary malignant neoplasm of retroperitoneum and peritoneum: Secondary | ICD-10-CM | POA: Diagnosis not present

## 2016-03-09 DIAGNOSIS — C801 Malignant (primary) neoplasm, unspecified: Secondary | ICD-10-CM

## 2016-03-09 MED ORDER — PEGFILGRASTIM INJECTION 6 MG/0.6ML ~~LOC~~
6.0000 mg | PREFILLED_SYRINGE | Freq: Once | SUBCUTANEOUS | Status: AC
  Administered 2016-03-09: 6 mg via SUBCUTANEOUS
  Filled 2016-03-09: qty 0.6

## 2016-03-09 NOTE — Patient Instructions (Signed)
Pegfilgrastim injection What is this medicine? PEGFILGRASTIM (PEG fil gra stim) is a long-acting granulocyte colony-stimulating factor that stimulates the growth of neutrophils, a type of white blood cell important in the body's fight against infection. It is used to reduce the incidence of fever and infection in patients with certain types of cancer who are receiving chemotherapy that affects the bone marrow, and to increase survival after being exposed to high doses of radiation. This medicine may be used for other purposes; ask your health care provider or pharmacist if you have questions. What should I tell my health care provider before I take this medicine? They need to know if you have any of these conditions: -kidney disease -latex allergy -ongoing radiation therapy -sickle cell disease -skin reactions to acrylic adhesives (On-Body Injector only) -an unusual or allergic reaction to pegfilgrastim, filgrastim, other medicines, foods, dyes, or preservatives -pregnant or trying to get pregnant -breast-feeding How should I use this medicine? This medicine is for injection under the skin. If you get this medicine at home, you will be taught how to prepare and give the pre-filled syringe or how to use the On-body Injector. Refer to the patient Instructions for Use for detailed instructions. Use exactly as directed. Take your medicine at regular intervals. Do not take your medicine more often than directed. It is important that you put your used needles and syringes in a special sharps container. Do not put them in a trash can. If you do not have a sharps container, call your pharmacist or healthcare provider to get one. Talk to your pediatrician regarding the use of this medicine in children. While this drug may be prescribed for selected conditions, precautions do apply. Overdosage: If you think you have taken too much of this medicine contact a poison control center or emergency room at  once. NOTE: This medicine is only for you. Do not share this medicine with others. What if I miss a dose? It is important not to miss your dose. Call your doctor or health care professional if you miss your dose. If you miss a dose due to an On-body Injector failure or leakage, a new dose should be administered as soon as possible using a single prefilled syringe for manual use. What may interact with this medicine? Interactions have not been studied. Give your health care provider a list of all the medicines, herbs, non-prescription drugs, or dietary supplements you use. Also tell them if you smoke, drink alcohol, or use illegal drugs. Some items may interact with your medicine. This list may not describe all possible interactions. Give your health care provider a list of all the medicines, herbs, non-prescription drugs, or dietary supplements you use. Also tell them if you smoke, drink alcohol, or use illegal drugs. Some items may interact with your medicine. What should I watch for while using this medicine? You may need blood work done while you are taking this medicine. If you are going to need a MRI, CT scan, or other procedure, tell your doctor that you are using this medicine (On-Body Injector only). What side effects may I notice from receiving this medicine? Side effects that you should report to your doctor or health care professional as soon as possible: -allergic reactions like skin rash, itching or hives, swelling of the face, lips, or tongue -dizziness -fever -pain, redness, or irritation at site where injected -pinpoint red spots on the skin -red or dark-brown urine -shortness of breath or breathing problems -stomach or side pain, or pain   at the shoulder -swelling -tiredness -trouble passing urine or change in the amount of urine Side effects that usually do not require medical attention (report to your doctor or health care professional if they continue or are  bothersome): -bone pain -muscle pain This list may not describe all possible side effects. Call your doctor for medical advice about side effects. You may report side effects to FDA at 1-800-FDA-1088. Where should I keep my medicine? Keep out of the reach of children. Store pre-filled syringes in a refrigerator between 2 and 8 degrees C (36 and 46 degrees F). Do not freeze. Keep in carton to protect from light. Throw away this medicine if it is left out of the refrigerator for more than 48 hours. Throw away any unused medicine after the expiration date. NOTE: This sheet is a summary. It may not cover all possible information. If you have questions about this medicine, talk to your doctor, pharmacist, or health care provider.    2016, Elsevier/Gold Standard. (2014-10-22 14:30:14)  

## 2016-03-19 ENCOUNTER — Other Ambulatory Visit: Payer: Self-pay | Admitting: Oncology

## 2016-03-19 DIAGNOSIS — C786 Secondary malignant neoplasm of retroperitoneum and peritoneum: Secondary | ICD-10-CM

## 2016-03-19 DIAGNOSIS — C541 Malignant neoplasm of endometrium: Secondary | ICD-10-CM

## 2016-03-19 DIAGNOSIS — C801 Malignant (primary) neoplasm, unspecified: Secondary | ICD-10-CM

## 2016-03-20 ENCOUNTER — Ambulatory Visit (HOSPITAL_BASED_OUTPATIENT_CLINIC_OR_DEPARTMENT_OTHER): Payer: Commercial Managed Care - HMO | Admitting: Oncology

## 2016-03-20 ENCOUNTER — Telehealth: Payer: Self-pay | Admitting: Oncology

## 2016-03-20 ENCOUNTER — Other Ambulatory Visit (HOSPITAL_BASED_OUTPATIENT_CLINIC_OR_DEPARTMENT_OTHER): Payer: Commercial Managed Care - HMO

## 2016-03-20 ENCOUNTER — Encounter: Payer: Self-pay | Admitting: Oncology

## 2016-03-20 VITALS — BP 152/92 | HR 70 | Temp 99.2°F | Resp 18 | Wt 164.6 lb

## 2016-03-20 DIAGNOSIS — C541 Malignant neoplasm of endometrium: Secondary | ICD-10-CM

## 2016-03-20 DIAGNOSIS — C801 Malignant (primary) neoplasm, unspecified: Secondary | ICD-10-CM

## 2016-03-20 DIAGNOSIS — D701 Agranulocytosis secondary to cancer chemotherapy: Secondary | ICD-10-CM

## 2016-03-20 DIAGNOSIS — D6481 Anemia due to antineoplastic chemotherapy: Secondary | ICD-10-CM

## 2016-03-20 DIAGNOSIS — C786 Secondary malignant neoplasm of retroperitoneum and peritoneum: Secondary | ICD-10-CM

## 2016-03-20 DIAGNOSIS — N189 Chronic kidney disease, unspecified: Secondary | ICD-10-CM

## 2016-03-20 DIAGNOSIS — T451X5A Adverse effect of antineoplastic and immunosuppressive drugs, initial encounter: Secondary | ICD-10-CM

## 2016-03-20 DIAGNOSIS — G620 Drug-induced polyneuropathy: Secondary | ICD-10-CM

## 2016-03-20 DIAGNOSIS — D631 Anemia in chronic kidney disease: Secondary | ICD-10-CM

## 2016-03-20 DIAGNOSIS — R180 Malignant ascites: Secondary | ICD-10-CM

## 2016-03-20 LAB — CBC WITH DIFFERENTIAL/PLATELET
BASO%: 0.2 % (ref 0.0–2.0)
Basophils Absolute: 0 10*3/uL (ref 0.0–0.1)
EOS%: 0.7 % (ref 0.0–7.0)
Eosinophils Absolute: 0 10*3/uL (ref 0.0–0.5)
HEMATOCRIT: 31.8 % — AB (ref 34.8–46.6)
HGB: 10.5 g/dL — ABNORMAL LOW (ref 11.6–15.9)
LYMPH#: 1.2 10*3/uL (ref 0.9–3.3)
LYMPH%: 21.5 % (ref 14.0–49.7)
MCH: 32.3 pg (ref 25.1–34.0)
MCHC: 33.1 g/dL (ref 31.5–36.0)
MCV: 97.4 fL (ref 79.5–101.0)
MONO#: 0.6 10*3/uL (ref 0.1–0.9)
MONO%: 10.2 % (ref 0.0–14.0)
NEUT%: 67.4 % (ref 38.4–76.8)
NEUTROS ABS: 3.7 10*3/uL (ref 1.5–6.5)
PLATELETS: 174 10*3/uL (ref 145–400)
RBC: 3.26 10*6/uL — AB (ref 3.70–5.45)
RDW: 13.9 % (ref 11.2–14.5)
WBC: 5.4 10*3/uL (ref 3.9–10.3)

## 2016-03-20 LAB — COMPREHENSIVE METABOLIC PANEL
ALT: 16 U/L (ref 0–55)
ANION GAP: 7 meq/L (ref 3–11)
AST: 26 U/L (ref 5–34)
Albumin: 3.7 g/dL (ref 3.5–5.0)
Alkaline Phosphatase: 64 U/L (ref 40–150)
BILIRUBIN TOTAL: 0.3 mg/dL (ref 0.20–1.20)
BUN: 10.9 mg/dL (ref 7.0–26.0)
CO2: 27 meq/L (ref 22–29)
CREATININE: 1 mg/dL (ref 0.6–1.1)
Calcium: 9.5 mg/dL (ref 8.4–10.4)
Chloride: 107 mEq/L (ref 98–109)
EGFR: 68 mL/min/{1.73_m2} — ABNORMAL LOW (ref >90)
Glucose: 111 mg/dl (ref 70–140)
Potassium: 4.1 mEq/L (ref 3.5–5.1)
Sodium: 142 mEq/L (ref 136–145)
TOTAL PROTEIN: 7.3 g/dL (ref 6.4–8.3)

## 2016-03-20 MED ORDER — DEXAMETHASONE 4 MG PO TABS: ORAL_TABLET | ORAL | Status: DC

## 2016-03-20 NOTE — Progress Notes (Signed)
OFFICE PROGRESS NOTE   March 21, 2016   Physicians: W.Brewster/ Emma Rossi,Plotnikov, [name], MD, Gery Pray, [facility] [name]), [name]  INTERVAL HISTORY:  Patient is seen, together with husband, in continuing attention to recurrent high grade endometrial carcinoma which presented with carcinomatosis and malignant ascites 08-2015. She has improved with carboplatin and taxol, tho marker is no longer improving. She has required neulasta support even with carboplatin at AUC=3, and has some increased peripheral neuropathy. She had cycle 9 carbo taxol on 03-07-16. Plan is for repeat CT AP prior to seeing Dr Skeet Latch in late June.   Patient reports that she is generally feeling well, doing regular home activities and appetite fine. She has occasional constipation despite daily miralax, uses OTC "laxative" prn. She has tingling in fingertips not interfering with activity, and in feet which is more noticeable. She denies SOB, increased abdominal distension, any bleeding, LE swelling. Peripheral IV access is more difficult and she will need PAC if chemo continues after upcoming scans. She is tired for ~ 2 days "after neulasta". She is not interested in any regular exercise, tho husband would like her to do this. No fever or symptoms of infection.  Remainder of 10 point Review of Systems negative/ unchanged.  No central line No genetics testing. MMR normal on surgical path 04-2015  CA 125 09-14-15 2427 Cytology ER negative 08-2015  ONCOLOGIC HISTORY Patient had regular medical care by PCP Dr Alain Marion and gyn Dr Garwin Brothers. In spring 2016, she noticed low pelvic discomfort and some intermittent vaginal spotting. She was seen for regular visit by Dr Garwin Brothers in May 2016, with PAP positive for AGUS and US showing endometrial stripe 8.7m. Endometrial biopsy 03-12-15 showed high grade adenocarcinoma with papillary features, however immunostains were not typical for serous carcinoma. She was  seen by Dr RDenman Georgein June. CT CAP 04-14-15 showed no evidence of metastatic disease in chest, a 3.3 x 2.8 cm endometrial mas and 3.1 cm cystic lesion of right ovary. She had robotic total laparoscopic hysterectomy BSO with bilateral pelvic and [facility] Pathology [phone] grade 2 endometrioid adenocarcinoma invading 0.35 cm where myometrium 1.1 cm, 26 nodes negative and no LVSI. She had intracavitary radiation due to risk for vaginal cuff recurrence from 06-01-15 thru 07-01-15, vaginal cuff 27.5 gray in 5 fractions. There were no obvious concerns when she was seen by Dr RDenman Georgein late July or Dr KSondra Comeand Dr PAlain Marionin October. She presented to ED on 09-06-15 with generalized abdominal pain, bloating and constipation x 1 month. CT AP 09-06-15 showed extensive nodularity and stranding thruout omentum and large volume ascites, with liver/spleen/pancreas/adrenals not remarkable, no bowel obstruction. CA 125 was 2427. She had UKoreaparacentesi on 09-07-15 for 5 liters of serous fluid, cytology (([id number] adenocarcinoma. She had consultation with Dr RDenman Georgeon 09-13-15, recommendation for systemic chemotherapy. Review of cytology from ascites compared with surgical specimen and [facility] high grade features that were seen on the initial endometrial biopsy (not seen on the full surgical path); ER negative. She had repeat UKoreaparacentesis for 5.2 liters on 09-20-15. She had first carboplatin taxol on 09-21-15 and was neutropenic by day 8 cycle 1, granix begun. She had cycle 2 at same doses (carbo AUC = 3.5) 0n 10-13-15 with neulasta, again neutropenic such that doses were adjusted subsequently. CT AP 01-12-16 after cycle 6 showed improvement, tho still evidence of disease;  CA 125 was 683 with cycle 6 chemo.      Objective:  Vital signs in last 24 hours:  BP 152/92 mmHg  Pulse 70  Temp(Src) 99.2 F  (37.3 C) (Oral)  Resp 18  Wt 164 lb 9.6 oz (74.662 kg)  SpO2 98% Weight stable. Respirations not labored. Looks comfortable Alert, oriented and appropriate. Ambulatory without difficulty.    HEENT:PERRL, sclerae not icteric. Oral mucosa moist without lesions, posterior pharynx clear.  Neck supple. No JVD.  Lymphatics:no supraclavicular adenopathy Resp: clear to auscultation bilaterally and normal percussion bilaterally Cardio: regular rate and rhythm. No gallop. GI: soft, nontender, mildly distended not changed, likely some ascites, cannot appreciate mass or organomegaly. Normally active bowel sounds.  Musculoskeletal/ Extremities: without pitting edema, cords, tenderness Neuro: minimal peripheral neuropathy tips of fingers, decreased sensation distal feet and toes. Otherwise nonfocal. PSYCH appropriate mood and affect Skin without rash, ecchymosis, petechiae   Lab Results:  Results for orders placed or performed in visit on 03/20/16  CBC with Differential  Result Value Ref Range   WBC 5.4 3.9 - 10.3 10e3/uL   NEUT# 3.7 1.5 - 6.5 10e3/uL   HGB 10.5 (L) 11.6 - 15.9 g/dL   HCT 31.8 (L) 34.8 - 46.6 %   Platelets 174 145 - 400 10e3/uL   MCV 97.4 79.5 - 101.0 fL   MCH 32.3 25.1 - 34.0 pg   MCHC 33.1 31.5 - 36.0 g/dL   RBC 3.26 (L) 3.70 - 5.45 10e6/uL   RDW 13.9 11.2 - 14.5 %   lymph# 1.2 0.9 - 3.3 10e3/uL   MONO# 0.6 0.1 - 0.9 10e3/uL   Eosinophils Absolute 0.0 0.0 - 0.5 10e3/uL   Basophils Absolute 0.0 0.0 - 0.1 10e3/uL   NEUT% 67.4 38.4 - 76.8 %   LYMPH% 21.5 14.0 - 49.7 %   MONO% 10.2 0.0 - 14.0 %   EOS% 0.7 0.0 - 7.0 %   BASO% 0.2 0.0 - 2.0 %  Comprehensive metabolic panel  Result Value Ref Range   Sodium 142 136 - 145 mEq/L   Potassium 4.1 3.5 - 5.1 mEq/L   Chloride 107 98 - 109 mEq/L   CO2 27 22 - 29 mEq/L   Glucose 111 70 - 140 mg/dl   BUN 10.9 7.0 - 26.0 mg/dL   Creatinine 1.0 0.6 - 1.1 mg/dL   Total Bilirubin 0.30 0.20 - 1.20 mg/dL   Alkaline Phosphatase 64  40 - 150 U/L   AST 26 5 - 34 U/L   ALT 16 0 - 55 U/L   Total Protein 7.3 6.4 - 8.3 g/dL   Albumin 3.7 3.5 - 5.0 g/dL   Calcium 9.5 8.4 - 10.4 mg/dL   Anion Gap 7 3 - 11 mEq/L   EGFR 68 (L) >90 ml/min/1.73 m2  CA 125  Result Value Ref Range   Cancer Antigen (CA) 125 713.3 (H) 0.0 - 38.1 U/mL    CA 125 above was available after visit, this having been 669 on 02-28-16 and 568 on 02-07-16  Studies/Results:  No results found. Last imaging was CT AP 01-12-16  Echocardiogram 09-28-15 EF 55 - 60%  Medications: I have reviewed the patient's current medications.  DISCUSSION At visit we discussed quality of life considerations as we try to control this recurrent endometrial cancer. With marker trending higher and increased neuropathy, and with difficult IV access, will discuss with patient by phone to recommend holding treatment next week. May be able to get CT sooner than 6-27, then  could arrange PAC by IR if disease still seen, as I suspect it will be.    Assessment/Plan:  1. 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 Chemotherapy begun with carbo taxol 09-21-15,has required dose adjustment and neulasta support   CT AP 01-12-16 after cycle 6 persistant carcinomatosis and ascites tho much improved.Has now had 3 additional cycles, marker trending up and some neuropathy. Will repeat CTs. Needs PAC prior to further IV treatment. Consider doxil 2. Chemo neutropenia has required dose reduction in carboplatin and neulasta. Previous pelvic RT likely impacting counts. Doxil likely would be easier on counts 3.Admission for tachycardia after cycle 1, HR sustained 150-160, no other findings and resolved when beta blocker was resumed, this DCd in error by patient. 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 Managed by Dr Alain Marion 8.post cholecystectomy and up to date colonoscopy and mammograms 9..squamous cell carcinoma anus 2003: radiation and may have had chemo then, may be impacting counts with present chemo. 10.chemo peripheral neuropathy more noticeable.   All questions answered at visit, will be in touch re marker as above. Time spent 25 min including >50% counseling and coordination of care. Route Dr Alain Marion, cc Dr Skeet Latch.    Gordy Levan, MD   03/21/2016, 7:34 PM

## 2016-03-20 NOTE — Telephone Encounter (Signed)
appt made and avs printed °

## 2016-03-21 ENCOUNTER — Other Ambulatory Visit: Payer: Self-pay | Admitting: Oncology

## 2016-03-21 LAB — CA 125: CANCER ANTIGEN (CA) 125: 713.3 U/mL — AB (ref 0.0–38.1)

## 2016-03-22 ENCOUNTER — Telehealth: Payer: Self-pay | Admitting: Oncology

## 2016-03-22 ENCOUNTER — Other Ambulatory Visit: Payer: Self-pay | Admitting: Oncology

## 2016-03-22 DIAGNOSIS — C801 Malignant (primary) neoplasm, unspecified: Secondary | ICD-10-CM

## 2016-03-22 DIAGNOSIS — C541 Malignant neoplasm of endometrium: Secondary | ICD-10-CM

## 2016-03-22 DIAGNOSIS — C786 Secondary malignant neoplasm of retroperitoneum and peritoneum: Secondary | ICD-10-CM

## 2016-03-22 NOTE — Progress Notes (Signed)
Follow Up Note: Gyn-Onc  Sharon Holt 75 y.o. female  CC:  Chief Complaint  Patient presents with  . endometrial cancer    follow up  Treatment counseling.  Assessment/Plan:  75 y.o.  with progression of Stage IA Grade 2 endometrial adenocarcinoma. She has received 9 cycles taxol/carbo with rise in CA 125 .  Feels well. t).  I discussed response rate (20%) with additional regimes and the fact that disease resistance will occur.  I recommend chemotherapy with doxil +/- avastin. Her history is notable for diverticulosis with no active diverticulitis or bowel symptomatology. 20 minute discussion with the family regarding chemotherapy options and disease course.  All of their questions were answered to their satisfaction Follow-up 06/15/2016  HPI:  Sharon Holt is a 75 y.o. 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  She had a 2 month history of vaginal spotting and saw Dr. Garwin Brothers for her scheduled annual visit in May 2016. A Pap was performed which was positive for AGUS. She then underwent ultrasound evaluation, which revealed a thickened endometrial stripe at 8.8 mm with the uterus itself measured 8 x 5 x 6 cm. An endometrial biopsy was performed on 03/12/2015 which revealed high-grade adenocarcinoma with papillary features architecturally however the immunostains were not typical for serous adenocarcinoma.  On 04/20/2015, she underwent a Robotic total laparoscopic hysterectomy, bilateral salpingo oophorectomy, bilateral pelvic lymph node dissection, bilateral para-aortic lymph node dissection    Final pathology revealed:   1. Lymph nodes, regional resection, right periaortic - SIX BENIGN LYMPH NODES (0/6). 2. Lymph nodes, regional resection, left periaortic - FIVE BENIGN LYMPH NODE (0/5). 3. Lymph nodes, regional resection, right pelvic - SEVEN BENIGN LYMPH NODES (0/7) 4. Lymph nodes, regional resection, left  pelvic - EIGHT BENIGN LYMPH NODES (0/8) 5. Uterus and cervix, bilateral tubes and ovaries - ENDOMETRIAL ADENOCARCINOMA INVADING THE SUPERFICIAL MYOMETRIUM. - MARGINS NOT INVOLVED. - RIGHT OVARY: MATURE CYSTIC TERATOMA. - CERVIX, RIGHT AND LEFT OVARIES AND RIGHT AND LEFT FALLOPIAN TUBES FREE OF TUMOR.  We requested that her initial pathology from the biopsy be compared to the postop biopsy due to the descrepancy (grade 2 endometrioid tumor on hysterectomy, high-grade adenocarcinoma with architectural papillary features but immunohistochemicalstaining not typical for serous carcinoma on biopsy ). There was concordance between the reads. Because the larger volume of tumor (the hysterectomy specimen) showed no serous features, we treated this is a grade 2 endometrioid lesion, not serous.  She went on to receive vaginal brachytherapy for high/intermediate risk factors in the uterine pathology.  She was admitted to [facility] on 09/07/15 with abdominal distension and discomfort. CT abdomen and pelvis on 09/07/15 showed large volume ascites and an omental cake of tumor. There was no apparent chest disease. She underwent paracentesis on 09/08/15 and the fluid was sent for cytology which confirmed metastatic adenocarcinoma.  Received cycle 9 taxol/carboplatin 03/07/2016. CA 125 rising since 01/2016 ca125 03/20/2016  713  Feels well, reports fatigue and mild neuropathy, no abdominal bloating, no SOB  Review of Systems  Constitutional: Feels fatigued.  Cardiovascular: No chest pain, shortness of breath, or edema.  Pulmonary: No cough or wheeze.  Gastrointestinal: No nausea, vomiting, or diarrhea. No bright red blood per rectum or change in bowel movement.  Genitourinary: No frequency, urgency, or dysuria. No vaginal bleeding or discharge.  Musculoskeletal: No myalgia or joint pain. Neurologic: No weakness, numbness, or change in gait.  Psychology: No depression, anxiety, or insomnia.  Social Hx:    Social History   Social History  . Marital Status: Married    Spouse Name: Chasitty Guilbert  . Number of Children: 3  . Years of Education: N/A   Occupational History  . Retired     Company secretary   Social History Main Topics  . Smoking status: Never Smoker   . Smokeless tobacco: Never Used  . Alcohol Use: No  . Drug Use: No  . Sexual Activity: Not on file   Other Topics Concern  . Not on file   Social History Narrative    Past Surgical Hx:  Past Surgical History  Procedure Laterality Date  . Cholecystectomy    . Tonsillectomy    . Breast lumpectomy      right breast BENIGN  . Robotic assisted total hysterectomy with bilateral salpingo oopherectomy Bilateral 04/20/2015    Procedure: ROBOTIC ASSISTED TOTAL HYSTERECTOMY WITH BILATERAL SALPINGO OOPHORECTOMY AND PELVIC AND PERIAORTIC LYMPHADECTOMY;  Surgeon: Janie Morning, MD;  Location: [facility];  Service: Gynecology;  Laterality: Bilateral;    Past Medical Hx:  Past Medical History  Diagnosis Date  . Unspecified essential hypertension   . Mitral valve disorders   . Other and unspecified hyperlipidemia   . Other abnormal glucose   . Anxiety state, unspecified   . Anemia, unspecified   . 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 Transfusion history     "back in my 20's"  . 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 Osteoporosis   . Endometrial cancer ([name]) 04/2015    serous adenocarcinoma  . History of brachytherapy 06/01/15, 06/10/15, 06/17/15, 06/24/15, 07/01/15    vaginal cuff 27.5 gray    Family Hx:  Family History  Problem Relation Age of Onset  . Prostate cancer Brother     dx. 24s  . Stroke Father   . Breast cancer Sister     dx. 64-65  . Dementia Brother   . Breast cancer Sister 47    mastectomy  . Leukemia Sister     dx. 7s-70s    Vitals:  Blood pressure 182/68, pulse 79, temperature 98.5 F (36.9 C), temperature source Oral, resp. rate 18, height  5\' 6"  (1.676 m), weight 164 lb 11.2 oz (74.707 kg), SpO2 100 %.  Physical Exam:  General: Well developed, well nourished female in no acute distress. Alert and oriented x 3.  Cardiovascular: Regular rate and rhythm. S1 and S2 normal.  Lungs: Clear to auscultation bilaterally. No wheezes/crackles/rhonchi noted.  Skin: No rashes or lesions present. Back: No CVA tenderness.  Gyn: Nl EGBUS, no pelvic masses or nodularity Rectal:  Good tone, no masses Abdomen: Abdomen soft, non-tender , obese. Active bowel sounds in all quadrants. Incisions clean, dry and in tact. Extremities: No bilateral cyanosis, edema, or clubbing.

## 2016-03-22 NOTE — Telephone Encounter (Signed)
Medical Oncology  Spoke with patient by phone re increase in marker, with recommendation that we hold carbo taxol cycle 10 planned for 6-13 at least until reevaluate with scans. I have explained that if this chemo is not helping now, we do not want to continue same agents and possibly worsen neuropathy. Fortunately patient is still feeling well overall. She agrees with seeing Dr Skeet Latch on 6-8 in afternoon; this will be prior to scans, but will be helpful for her to review situation now.  Patient aware that lab 6-12, treatment 6-13 and injection 6-15 will be cancelled.  We will let her know about earlier scans and follow up visit med onc.  Gyn onc will let her know time for Dr Leone Brand visit on 6-8   - thank you.  Patient understood conversation and appreciated call.  Godfrey Pick, MD

## 2016-03-22 NOTE — Telephone Encounter (Signed)
Apts cancelled per pof, a msg was sent to dr Marko Plume by Lenna Sciara for clarification 7/26... Still awaiting response, new order put in by dr Marko Plume for resched CT scan apt3

## 2016-03-23 ENCOUNTER — Ambulatory Visit: Payer: Commercial Managed Care - HMO | Attending: Gynecologic Oncology | Admitting: Gynecologic Oncology

## 2016-03-23 ENCOUNTER — Other Ambulatory Visit: Payer: Self-pay | Admitting: Oncology

## 2016-03-23 ENCOUNTER — Encounter: Payer: Self-pay | Admitting: Gynecologic Oncology

## 2016-03-23 ENCOUNTER — Telehealth: Payer: Self-pay | Admitting: Oncology

## 2016-03-23 VITALS — BP 182/68 | HR 79 | Temp 98.5°F | Resp 18 | Ht 66.0 in | Wt 164.7 lb

## 2016-03-23 DIAGNOSIS — Z9049 Acquired absence of other specified parts of digestive tract: Secondary | ICD-10-CM | POA: Insufficient documentation

## 2016-03-23 DIAGNOSIS — Z85048 Personal history of other malignant neoplasm of rectum, rectosigmoid junction, and anus: Secondary | ICD-10-CM | POA: Diagnosis not present

## 2016-03-23 DIAGNOSIS — F419 Anxiety disorder, unspecified: Secondary | ICD-10-CM | POA: Diagnosis not present

## 2016-03-23 DIAGNOSIS — I10 Essential (primary) hypertension: Secondary | ICD-10-CM | POA: Insufficient documentation

## 2016-03-23 DIAGNOSIS — K5730 Diverticulosis of large intestine without perforation or abscess without bleeding: Secondary | ICD-10-CM

## 2016-03-23 DIAGNOSIS — C541 Malignant neoplasm of endometrium: Secondary | ICD-10-CM | POA: Diagnosis not present

## 2016-03-23 DIAGNOSIS — E785 Hyperlipidemia, unspecified: Secondary | ICD-10-CM | POA: Insufficient documentation

## 2016-03-23 DIAGNOSIS — C786 Secondary malignant neoplasm of retroperitoneum and peritoneum: Secondary | ICD-10-CM | POA: Diagnosis not present

## 2016-03-23 DIAGNOSIS — C801 Malignant (primary) neoplasm, unspecified: Secondary | ICD-10-CM

## 2016-03-23 NOTE — Telephone Encounter (Signed)
lvm to inform patient of r/s appt per LL 6/8 pof

## 2016-03-23 NOTE — Patient Instructions (Addendum)
You will receive a phone call about upcoming appointments with Dr. Marko Plume.  Plan to begin Doxil and Avastin.  Please call for any questions or concerns.  Doxorubicin Liposomal injection What is this medicine? LIPOSOMAL DOXORUBICIN (LIP oh som al dox oh ROO bi sin) is a chemotherapy drug. This medicine is used to treat many kinds of cancer like Kaposi's sarcoma, multiple myeloma, and ovarian cancer. This medicine may be used for other purposes; ask your health care provider or pharmacist if you have questions. What should I tell my health care provider before I take this medicine? They need to know if you have any of these conditions: -blood disorders -heart disease -infection (especially a virus infection such as chickenpox, cold sores, or herpes) -liver disease -recent or ongoing radiation therapy -an unusual or allergic reaction to doxorubicin, other chemotherapy agents, soybeans, other medicines, foods, dyes, or preservatives -pregnant or trying to get pregnant -breast-feeding How should I use this medicine? This drug is given as an infusion into a vein. It is administered in a hospital or clinic by a specially trained health care professional. If you have pain, swelling, burning or any unusual feeling around the site of your injection, tell your health care professional right away. Talk to your pediatrician regarding the use of this medicine in children. Special care may be needed. Overdosage: If you think you have taken too much of this medicine contact a poison control center or emergency room at once. NOTE: This medicine is only for you. Do not share this medicine with others. What if I miss a dose? It is important not to miss your dose. Call your doctor or health care professional if you are unable to keep an appointment. What may interact with this medicine? Do not take this medicine with any of the following medications: -zidovudine This medicine may also interact with the  following medications: -medicines to increase blood counts like filgrastim, pegfilgrastim, sargramostim -vaccines Talk to your doctor or health care professional before taking any of these medicines: -acetaminophen -aspirin -ibuprofen -ketoprofen -naproxen This list may not describe all possible interactions. Give your health care provider a list of all the medicines, herbs, non-prescription drugs, or dietary supplements you use. Also tell them if you smoke, drink alcohol, or use illegal drugs. Some items may interact with your medicine. What should I watch for while using this medicine? Your condition will be monitored carefully while you are receiving this medicine. You will need important blood work done while you are taking this medicine. This drug may make you feel generally unwell. This is not uncommon, as chemotherapy can affect healthy cells as well as cancer cells. Report any side effects. Continue your course of treatment even though you feel ill unless your doctor tells you to stop. Your urine may turn orange-red for a few days after your dose. This is not blood. If your urine is dark or brown, call your doctor. In some cases, you may be given additional medicines to help with side effects. Follow all directions for their use. Call your doctor or health care professional for advice if you get a fever (100.5 degrees F or higher), chills or sore throat, or other symptoms of a cold or flu. Do not treat yourself. This drug decreases your body's ability to fight infections. Try to avoid being around people who are sick. This medicine may increase your risk to bruise or bleed. Call your doctor or health care professional if you notice any unusual bleeding. Be careful brushing  and flossing your teeth or using a toothpick because you may get an infection or bleed more easily. If you have any dental work done, tell your dentist you are receiving this medicine. Avoid taking products that contain  aspirin, acetaminophen, ibuprofen, naproxen, or ketoprofen unless instructed by your doctor. These medicines may hide a fever. Men and women of childbearing age should use effective birth control methods while using taking this medicine. Do not become pregnant while taking this medicine. There is a potential for serious side effects to an unborn child. Talk to your health care professional or pharmacist for more information. Do not breast-feed an infant while taking this medicine. Talk to your doctor about your risk of cancer. You may be more at risk for certain types of cancers if you take this medicine. What side effects may I notice from receiving this medicine? Side effects that you should report to your doctor or health care professional as soon as possible: -allergic reactions like skin rash, itching or hives, swelling of the face, lips, or tongue -low blood counts - this medicine may decrease the number of white blood cells, red blood cells and platelets. You may be at increased risk for infections and bleeding. -signs of hand-foot syndrome - tingling or burning, redness, flaking, swelling, small blisters, or small sores on the palms of your hands or the soles of your feet -signs of infection - fever or chills, cough, sore throat, pain or difficulty passing urine -signs of decreased platelets or bleeding - bruising, pinpoint red spots on the skin, black, tarry stools, blood in the urine -signs of decreased red blood cells - unusually weak or tired, fainting spells, lightheadedness -back pain, chills, facial flushing, fever, headache, tightness in the chest or throat during the infusion -breathing problems -chest pain -fast, irregular heartbeat -mouth pain, redness, sores -pain, swelling, redness at site where injected -pain, tingling, numbness in the hands or feet -swelling of ankles, feet, or hands -vomiting Side effects that usually do not require medical attention (report to your doctor  or health care professional if they continue or are bothersome): -diarrhea -hair loss -loss of appetite -nail discoloration or damage -nausea -red or watery eyes -red colored urine -stomach upset This list may not describe all possible side effects. Call your doctor for medical advice about side effects. You may report side effects to FDA at 1-800-FDA-1088. Where should I keep my medicine? This drug is given in a hospital or clinic and will not be stored at home. NOTE: This sheet is a summary. It may not cover all possible information. If you have questions about this medicine, talk to your doctor, pharmacist, or health care provider.    2016, Elsevier/Gold Standard. (2012-06-21 10:12:56)  Bevacizumab injection What is this medicine? BEVACIZUMAB (be va SIZ yoo mab) is a monoclonal antibody. It is used to treat cervical cancer, colorectal cancer, glioblastoma multiforme, non-small cell lung cancer (NSCLC), ovarian cancer, and renal cell cancer. This medicine may be used for other purposes; ask your health care provider or pharmacist if you have questions. What should I tell my health care provider before I take this medicine? They need to know if you have any of these conditions: -blood clots -heart disease, including heart failure, heart attack, or chest pain (angina) -high blood pressure -infection (especially a virus infection such as chickenpox, cold sores, or herpes) -kidney disease -lung disease -prior chemotherapy with doxorubicin, daunorubicin, epirubicin, or other anthracycline type chemotherapy agents -recent or ongoing radiation therapy -recent surgery -stroke -  an unusual or allergic reaction to bevacizumab, hamster proteins, mouse proteins, other medicines, foods, dyes, or preservatives -pregnant or trying to get pregnant -breast-feeding How should I use this medicine? This medicine is for infusion into a vein. It is given by a health care professional in a hospital or  clinic setting. Talk to your pediatrician regarding the use of this medicine in children. Special care may be needed. Overdosage: If you think you have taken too much of this medicine contact a poison control center or emergency room at once. NOTE: This medicine is only for you. Do not share this medicine with others. What if I miss a dose? It is important not to miss your dose. Call your doctor or health care professional if you are unable to keep an appointment. What may interact with this medicine? Interactions are not expected. This list may not describe all possible interactions. Give your health care provider a list of all the medicines, herbs, non-prescription drugs, or dietary supplements you use. Also tell them if you smoke, drink alcohol, or use illegal drugs. Some items may interact with your medicine. What should I watch for while using this medicine? Your condition will be monitored carefully while you are receiving this medicine. You will need important blood work and urine testing done while you are taking this medicine. During your treatment, let your health care professional know if you have any unusual symptoms, such as difficulty breathing. This medicine may rarely cause 'gastrointestinal perforation' (holes in the stomach, intestines or colon), a serious side effect requiring surgery to repair. This medicine should be started at least 28 days following major surgery and the site of the surgery should be totally healed. Check with your doctor before scheduling dental work or surgery while you are receiving this treatment. Talk to your doctor if you have recently had surgery or if you have a wound that has not healed. Do not become pregnant while taking this medicine or for 6 months after stopping it. Women should inform their doctor if they wish to become pregnant or think they might be pregnant. There is a potential for serious side effects to an unborn child. Talk to your health  care professional or pharmacist for more information. Do not breast-feed an infant while taking this medicine. This medicine has caused ovarian failure in some women. This medicine may interfere with the ability to have a child. You should talk to your doctor or health care professional if you are concerned about your fertility. What side effects may I notice from receiving this medicine? Side effects that you should report to your doctor or health care professional as soon as possible: -allergic reactions like skin rash, itching or hives, swelling of the face, lips, or tongue -signs of infection - fever or chills, cough, sore throat, pain or trouble passing urine -signs of decreased platelets or bleeding - bruising, pinpoint red spots on the skin, black, tarry stools, nosebleeds, blood in the urine -breathing problems -changes in vision -chest pain -confusion -jaw pain, especially after dental work -mouth sores -seizures -severe abdominal pain -severe headache -sudden numbness or weakness of the face, arm or leg -swelling of legs or ankles -symptoms of a stroke: change in mental awareness, inability to talk or move one side of the body (especially in patients with lung cancer) -trouble passing urine or change in the amount of urine -trouble speaking or understanding -trouble walking, dizziness, loss of balance or coordination Side effects that usually do not require medical attention (  report to your doctor or health care professional if they continue or are bothersome): -constipation -diarrhea -dry skin -headache -loss of appetite -nausea, vomiting This list may not describe all possible side effects. Call your doctor for medical advice about side effects. You may report side effects to FDA at 1-800-FDA-1088. Where should I keep my medicine? This drug is given in a hospital or clinic and will not be stored at home. NOTE: This sheet is a summary. It may not cover all possible  information. If you have questions about this medicine, talk to your doctor, pharmacist, or health care provider.    2016, Elsevier/Gold Standard. (2014-12-01 16:58:44)

## 2016-03-27 ENCOUNTER — Other Ambulatory Visit: Payer: Commercial Managed Care - HMO

## 2016-03-27 ENCOUNTER — Ambulatory Visit: Payer: Commercial Managed Care - HMO | Admitting: Oncology

## 2016-03-28 ENCOUNTER — Other Ambulatory Visit: Payer: Commercial Managed Care - HMO

## 2016-03-28 ENCOUNTER — Ambulatory Visit (HOSPITAL_COMMUNITY): Payer: Commercial Managed Care - HMO

## 2016-03-28 ENCOUNTER — Ambulatory Visit: Payer: Commercial Managed Care - HMO

## 2016-03-29 ENCOUNTER — Other Ambulatory Visit: Payer: Self-pay | Admitting: Oncology

## 2016-03-30 ENCOUNTER — Ambulatory Visit: Payer: Commercial Managed Care - HMO

## 2016-03-30 ENCOUNTER — Other Ambulatory Visit: Payer: Self-pay | Admitting: Oncology

## 2016-03-31 ENCOUNTER — Ambulatory Visit (HOSPITAL_COMMUNITY)
Admission: RE | Admit: 2016-03-31 | Discharge: 2016-03-31 | Disposition: A | Discharge status: Home / Self Care | Payer: Commercial Managed Care - HMO | Source: Ambulatory Visit | Attending: Oncology | Admitting: Oncology

## 2016-03-31 ENCOUNTER — Other Ambulatory Visit: Payer: Self-pay | Admitting: Oncology

## 2016-03-31 ENCOUNTER — Other Ambulatory Visit: Payer: Self-pay

## 2016-03-31 ENCOUNTER — Encounter (HOSPITAL_COMMUNITY): Payer: Self-pay

## 2016-03-31 DIAGNOSIS — Z90710 Acquired absence of both cervix and uterus: Secondary | ICD-10-CM | POA: Insufficient documentation

## 2016-03-31 DIAGNOSIS — C786 Secondary malignant neoplasm of retroperitoneum and peritoneum: Secondary | ICD-10-CM | POA: Insufficient documentation

## 2016-03-31 DIAGNOSIS — C541 Malignant neoplasm of endometrium: Secondary | ICD-10-CM

## 2016-03-31 DIAGNOSIS — R188 Other ascites: Secondary | ICD-10-CM | POA: Diagnosis not present

## 2016-03-31 DIAGNOSIS — C801 Malignant (primary) neoplasm, unspecified: Secondary | ICD-10-CM

## 2016-03-31 MED ORDER — IOPAMIDOL (ISOVUE-300) INJECTION 61%
100.0000 mL | Freq: Once | INTRAVENOUS | Status: AC | PRN
  Administered 2016-03-31: 100 mL via INTRAVENOUS

## 2016-04-03 MED ORDER — LIDOCAINE-PRILOCAINE 2.5-2.5 % EX CREA: TOPICAL_CREAM | CUTANEOUS | Status: AC

## 2016-04-04 ENCOUNTER — Telehealth: Payer: Self-pay

## 2016-04-04 ENCOUNTER — Other Ambulatory Visit: Payer: Self-pay | Admitting: General Surgery

## 2016-04-04 ENCOUNTER — Other Ambulatory Visit: Payer: Self-pay | Admitting: Radiology

## 2016-04-04 NOTE — Telephone Encounter (Signed)
Actually spoke with Sharon Holt on Friday 03-31-16 regarding the results of the CT scan as noted below by Dr. Marko Plume. Dr. Skeet Latch reviewed the side effects of the Doxil with her and gave her information in this.   Sharon Holt verbalized side effects accurately with out prompting.  Will give her the handout of Recommendations for  Managing side effect while on Doxil at 04-10-16 visit. Sharon Holt is having PAC placed 04-05-16 at [facility]. EMLA cream prescription sent to her pharmacy and reviewed  application with her and husband. Sharon Holt is expecting to receive chemotherapy after discussing with Dr. Marko Plume at 04-10-16 visit.  Sharon Holt is afebrile.   No symptoms of head or chest  Congestion today.  She forced fluids and took guaifenesin as recommended by Dr. Marko Plume as noted below.

## 2016-04-04 NOTE — Telephone Encounter (Deleted)
Sharon Marion Downer, MD  Baruch Merl, RN           Sorry - now I see she is set up and already preauth for doxil on 6-26.

## 2016-04-04 NOTE — Telephone Encounter (Signed)
Sharon Marion Downer, MD  Baruch Merl, RN           Sorry - now I see she is set up and already preauth for doxil on 6-26.  So if she is comfortable with that plan, including teaching, and if we get PAC prior to 6-26 that is fine.  If not, will cancel RX 6-26 and reschedule after I see her/ after we get PAC etc.  thanks

## 2016-04-04 NOTE — Telephone Encounter (Signed)
-----   Message from Gordy Levan, MD sent at 03/31/2016  2:06 PM EDT ----- Please let her know that the CT shows significant improvement overall in the cancer fluid and areas in the abdomen and pelvis, but still some cancer that we can tell. Dr Skeet Latch and I would like to change her chemotherapy to doxil; we may add another medicine to this later.  She will need PAC both because veins are difficult now and because the doxil needs PAC for administration.  If she is ok with that, please set up PAC by IR in next ~ 2 weeks.  (already had echo) Please also begin doxil teaching. Will RN to go back over skin care with written information when she is at office next Will cancel treatment 6-26 so that I can go over all of this with her first.  I will put in doxil orders for later that week and ask for preauth. If she does not like this plan, we can cancel  URI: She should not use OTC decongestants as she tends to get tachycardic. Tylenol, benadryl, lots of fluids, guaifenesin if congested all ok.  Thank you!

## 2016-04-05 ENCOUNTER — Ambulatory Visit (HOSPITAL_COMMUNITY)
Admission: RE | Admit: 2016-04-05 | Discharge: 2016-04-05 | Disposition: A | Discharge status: Home / Self Care | Payer: Commercial Managed Care - HMO | Source: Ambulatory Visit | Attending: Oncology | Admitting: Oncology

## 2016-04-05 ENCOUNTER — Other Ambulatory Visit: Payer: Self-pay | Admitting: Oncology

## 2016-04-05 ENCOUNTER — Encounter (HOSPITAL_COMMUNITY): Payer: Self-pay

## 2016-04-05 DIAGNOSIS — D649 Anemia, unspecified: Secondary | ICD-10-CM | POA: Diagnosis not present

## 2016-04-05 DIAGNOSIS — M810 Age-related osteoporosis without current pathological fracture: Secondary | ICD-10-CM | POA: Insufficient documentation

## 2016-04-05 DIAGNOSIS — F419 Anxiety disorder, unspecified: Secondary | ICD-10-CM | POA: Diagnosis not present

## 2016-04-05 DIAGNOSIS — K739 Chronic hepatitis, unspecified: Secondary | ICD-10-CM | POA: Diagnosis not present

## 2016-04-05 DIAGNOSIS — Z85048 Personal history of other malignant neoplasm of rectum, rectosigmoid junction, and anus: Secondary | ICD-10-CM | POA: Insufficient documentation

## 2016-04-05 DIAGNOSIS — E785 Hyperlipidemia, unspecified: Secondary | ICD-10-CM | POA: Insufficient documentation

## 2016-04-05 DIAGNOSIS — C541 Malignant neoplasm of endometrium: Secondary | ICD-10-CM | POA: Diagnosis present

## 2016-04-05 LAB — PROTIME-INR
INR: 1 (ref 0.00–1.49)
PROTHROMBIN TIME: 13.4 s (ref 11.6–15.2)

## 2016-04-05 LAB — CBC WITH DIFFERENTIAL/PLATELET
Basophils Absolute: 0 10*3/uL (ref 0.0–0.1)
Basophils Relative: 0 %
EOS ABS: 0.1 10*3/uL (ref 0.0–0.7)
Eosinophils Relative: 2 %
HEMATOCRIT: 33.7 % — AB (ref 36.0–46.0)
HEMOGLOBIN: 11.3 g/dL — AB (ref 12.0–15.0)
LYMPHS ABS: 1.4 10*3/uL (ref 0.7–4.0)
Lymphocytes Relative: 30 %
MCH: 32 pg (ref 26.0–34.0)
MCHC: 33.5 g/dL (ref 30.0–36.0)
MCV: 95.5 fL (ref 78.0–100.0)
MONOS PCT: 10 %
Monocytes Absolute: 0.4 10*3/uL (ref 0.1–1.0)
NEUTROS ABS: 2.7 10*3/uL (ref 1.7–7.7)
NEUTROS PCT: 58 %
Platelets: 223 10*3/uL (ref 150–400)
RBC: 3.53 MIL/uL — AB (ref 3.87–5.11)
RDW: 13.6 % (ref 11.5–15.5)
WBC: 4.6 10*3/uL (ref 4.0–10.5)

## 2016-04-05 MED ORDER — MIDAZOLAM HCL 2 MG/2ML IJ SOLN
INTRAMUSCULAR | Status: AC
  Filled 2016-04-05: qty 2

## 2016-04-05 MED ORDER — MIDAZOLAM HCL 2 MG/2ML IJ SOLN
INTRAMUSCULAR | Status: AC | PRN
  Administered 2016-04-05: 1 mg via INTRAVENOUS

## 2016-04-05 MED ORDER — CEFAZOLIN SODIUM-DEXTROSE 2-4 GM/100ML-% IV SOLN
2.0000 g | INTRAVENOUS | Status: DC
Start: 2016-04-05 — End: 2016-04-06
  Filled 2016-04-05: qty 100

## 2016-04-05 MED ORDER — SODIUM CHLORIDE 0.9 % IV SOLN
INTRAVENOUS | Status: DC
  Administered 2016-04-05: 13:00:00 via INTRAVENOUS

## 2016-04-05 MED ORDER — LIDOCAINE HCL 1 % IJ SOLN
INTRAMUSCULAR | Status: AC | PRN
  Administered 2016-04-05: 10 mL via INTRADERMAL

## 2016-04-05 MED ORDER — FENTANYL CITRATE (PF) 100 MCG/2ML IJ SOLN
INTRAMUSCULAR | Status: AC
  Filled 2016-04-05: qty 2

## 2016-04-05 MED ORDER — HEPARIN SOD (PORK) LOCK FLUSH 100 UNIT/ML IV SOLN
INTRAVENOUS | Status: AC
  Filled 2016-04-05: qty 5

## 2016-04-05 MED ORDER — FENTANYL CITRATE (PF) 100 MCG/2ML IJ SOLN
INTRAMUSCULAR | Status: AC | PRN
  Administered 2016-04-05: 50 ug via INTRAVENOUS

## 2016-04-05 MED ORDER — LIDOCAINE HCL 1 % IJ SOLN
INTRAMUSCULAR | Status: AC
  Filled 2016-04-05: qty 20

## 2016-04-05 NOTE — Consult Note (Signed)
Chief Complaint: Patient was seen in consultation today for port a cath placement  Referring Physician(s): [place] Light  Supervising Physician: Aletta Edouard  Patient Status: Outpatient  History of Present Illness: Sharon Holt is a 76 y.o. female with history of endometrial cancer diagnosed in 2016, status post surgery as well as vaginal brachytherapy and chemotherapy. Recent imaging has revealed evidence of progressive disease. Patient also has poor venous access and presents today for Port-A-Cath placement for additional chemotherapy.  Past Medical History  Diagnosis Date  . Unspecified essential hypertension   . Mitral valve disorders   . Other and unspecified hyperlipidemia   . Other abnormal glucose   . Anxiety state, unspecified   . Anemia, unspecified   . 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 Transfusion history     "back in my 20's"  . 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 Osteoporosis   . 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 History of brachytherapy 06/01/15, 06/10/15, 06/17/15, 06/24/15, 07/01/15    vaginal cuff 27.5 gray    Past Surgical History  Procedure Laterality Date  . Cholecystectomy    . Tonsillectomy    . Breast lumpectomy      right breast BENIGN  . Robotic assisted total hysterectomy with bilateral salpingo oopherectomy Bilateral 04/20/2015    Procedure: ROBOTIC ASSISTED TOTAL HYSTERECTOMY WITH BILATERAL SALPINGO OOPHORECTOMY AND PELVIC AND PERIAORTIC LYMPHADECTOMY;  Surgeon: Janie Morning, MD;  Location: [facility];  Service: Gynecology;  Laterality: Bilateral;    Allergies: Review of patient's allergies indicates no known allergies.  Medications: Prior to Admission medications   Medication Sig Start Date End Date Taking? Authorizing Provider  atenolol (TENORMIN) 25 MG tablet Take 3 tablets (75 mg total) by mouth daily. 11/09/15  Yes Cassandria Anger, MD  Cholecalciferol (VITAMIN D3) 2000 UNITS capsule Take 2,000 Units by mouth daily.    Yes Historical Provider, MD  clorazepate (TRANXENE) 7.5 MG tablet Take 1 tablet (7.5 mg total) by mouth 2 (two) times daily as needed for anxiety. 02/08/16  Yes [name] Plotnikov, MD  feeding supplement, ENSURE ENLIVE, (ENSURE ENLIVE) LIQD Take 237 mLs by mouth 2 (two) times daily between meals. 09/29/15  Yes Barton Dubois, MD  LORazepam (ATIVAN) 0.5 MG tablet Place 1 tablet under the tongue or swallow every 6 hours as needed for nausea. 02/07/16  Yes Lennis Marion Downer, MD  Multiple Vitamins-Minerals (CENTRUM SILVER PO) Take 1 capsule by mouth daily.    Yes Historical Provider, MD  potassium chloride SA (K-DUR,KLOR-CON) 20 MEQ tablet Take 1 tablet (20 mEq total) by mouth daily. 11/09/15  Yes [name] Plotnikov, MD  predniSONE (DELTASONE) 5 MG tablet Take 0.5 tablets (2.5 mg total) by mouth daily. Patient taking differently: Take 2.5 mg by mouth every other day.  02/08/16  Yes [name] Plotnikov, MD  dexamethasone (DECADRON) 4 MG tablet Take 5 tablets by mouth with food 12 hours and 6 hours prior to taxol chemotherapy. 03/20/16   Lennis Marion Downer, MD  FERROCITE 324 MG TABS tablet Take 1 tablet (106 mg of iron total) by mouth daily. 02/07/16   Lennis Marion Downer, MD  lidocaine-prilocaine (EMLA) cream Apply 1-2 hours prior to [facility] Access as needed. 04/03/16   Lennis Marion Downer, MD  ondansetron (ZOFRAN) 8 MG tablet Take 0.5-1 tablets (4-8 mg total) by mouth every 8 (eight)  hours as needed for nausea, vomiting or refractory nausea / vomiting. Will NOT make drowsy. 09/17/15   [name] Plotnikov, MD  pantoprazole (PROTONIX) 40 MG tablet Take 1 tablet (40 mg total) by mouth daily. For acid reflux 10/12/15   Lennis Marion Downer, MD  polyethylene glycol (MIRALAX / GLYCOLAX) packet Take 17 g by mouth daily. Reported on 12/09/2015    Historical Provider, MD     Family History  Problem Relation Age of Onset  . Prostate cancer  Brother     dx. 47s  . Stroke Father   . Breast cancer Sister     dx. 64-65  . Dementia Brother   . Breast cancer Sister 15    mastectomy  . Leukemia Sister     dx. 46s-70s    Social History   Social History  . Marital Status: Married    Spouse Name: Nefertiti Lippard  . Number of Children: 3  . Years of Education: N/A   Occupational History  . Retired     Company secretary   Social History Main Topics  . Smoking status: Never Smoker   . Smokeless tobacco: Never Used  . Alcohol Use: No  . Drug Use: No  . Sexual Activity: Not Asked   Other Topics Concern  . None   Social History Narrative      Review of Systems  Constitutional: Negative for fever and chills.  Respiratory: Positive for cough. Negative for shortness of breath.   Cardiovascular: Negative for chest pain.  Gastrointestinal: Negative for nausea, vomiting, abdominal pain and blood in stool.  Genitourinary: Negative for dysuria and hematuria.  Musculoskeletal: Negative for back pain.  Neurological: Negative for headaches.    Vital Signs: BP 177/76 mmHg  Pulse 72  Temp(Src) 98.8 F (37.1 C) (Oral)  Resp 18  Ht 5\' 6"  (1.676 m)  Wt 164 lb 11.2 oz (74.707 kg)  BMI 26.60 kg/m2  SpO2 100%  Physical Exam  Constitutional: She is oriented to person, place, and time. She appears well-developed and well-nourished.  Cardiovascular: Normal rate and regular rhythm.   Pulmonary/Chest: Effort normal and breath sounds normal.  Abdominal: Soft. Bowel sounds are normal. There is no tenderness.  Musculoskeletal: Normal range of motion. She exhibits no edema.  Neurological: She is alert and oriented to person, place, and time.    Mallampati Score:     Imaging: Ct Abdomen Pelvis W Contrast  03/31/2016  CLINICAL DATA:  Recurrent endometrial cancer with peritoneal carcinomatosis EXAM: CT ABDOMEN AND PELVIS WITH CONTRAST TECHNIQUE: Multidetector CT imaging of the abdomen and pelvis was performed using the standard  protocol following bolus administration of intravenous contrast. CONTRAST:  162mL ISOVUE-300 IOPAMIDOL (ISOVUE-300) INJECTION 61% COMPARISON:  01/12/2016 FINDINGS: Lower chest:  Lung bases are clear. Hepatobiliary: Liver is within normal limits. Status post cholecystectomy. No intrahepatic ductal dilatation. Dilated common duct, measuring 11 mm, but smoothly tapering at the ampulla. Pancreas: Within normal limits. Spleen: 5 mm probable cyst in the central spleen. Adrenals/Urinary Tract: Adrenal glands are within normal limits. Kidneys are within normal limits.  No hydronephrosis. Bladder is unremarkable. Stomach/Bowel: Stomach is notable for a small hiatal hernia. No evidence of bowel obstruction. Normal appendix (series 2/image 59). Left colon is mildly thick-walled/underdistended. Vascular/Lymphatic: Atherosclerotic calcifications of the abdominal aorta and branch vessels. No evidence of abdominal aortic aneurysm. No suspicious abdominopelvic lymphadenopathy. Reproductive: Status post hysterectomy. No adnexal masses. Other: Small to moderate abdominopelvic ascites, mildly increased. Associated peritoneal nodularity/ omental caking (series 2/ image 37), minimally  increased from recent CT. Additional mild pelvic nodularity/ thickening along the dependent pelvis (series 2/image 72), including a discrete 10 mm nodule (series 2/image 66), new/increased. Small fat containing bilateral inguinal hernias, left greater than right. Musculoskeletal: Mild degenerative changes the lower thoracic spine. IMPRESSION: Status post hysterectomy. Small to moderate abdominopelvic ascites, mildly increased. Associated peritoneal nodularity/ omental caking, mildly progressed. Additional ancillary findings as above. Electronically Signed   By: Julian Hy M.D.   On: 03/31/2016 15:50    Labs:  CBC:  Recent Labs  02/28/16 0834 03/07/16 0750 03/20/16 0905 04/05/16 1242  WBC 7.9 5.3 5.4 4.6  HGB 10.4* 11.0* 10.5* 11.3*    HCT 31.7* 33.3* 31.8* 33.7*  PLT 150 183 174 223    COAGS:  Recent Labs  04/05/16 1242  INR 1.00    BMP:  Recent Labs  09/06/15 1123 09/07/15 0535  09/27/15 1924 09/28/15 0527  02/15/16 0846 02/28/16 0834 03/07/16 0750 03/20/16 0905  NA 134* 133*  < > 132* 130*  < > 138 141 138 142  K 4.4 4.3  < > 5.1 4.4  < > 4.3 3.7 4.4 4.1  CL 101 102  --  99* 101  --   --   --   --   --   CO2 24 25  < > 26 25  < > 23 27 23 27   GLUCOSE 151* 117*  < > 133* 127*  < > 222* 103 194* 111  BUN 11 10  < > 16 12  < > 17.1 15.3 17.1 10.9  CALCIUM 8.9 8.3*  < > 8.7* 7.9*  < > 9.8 9.4 9.7 9.5  CREATININE 1.29* 1.17*  < > 1.09* 1.01*  < > 1.0 0.9 0.9 1.0  GFRNONAA 40* 45*  --  49* 54*  --   --   --   --   --   GFRAA 46* 52*  --  57* >60  --   --   --   --   --   < > = values in this interval not displayed.  LIVER FUNCTION TESTS:  Recent Labs  02/15/16 0846 02/28/16 0834 03/07/16 0750 03/20/16 0905  BILITOT 0.30 <0.30 0.33 0.30  AST 25 30 30 26   ALT 18 19 19 16   ALKPHOS 55 61 54 64  PROT 8.1 7.2 7.7 7.3  ALBUMIN 4.0 3.7 3.8 3.7    TUMOR MARKERS: No results for input(s): AFPTM, CEA, CA199, CHROMGRNA in the last 8760 hours.  Assessment and Plan:  75 y.o. female with history of endometrial cancer diagnosed in 2016, status post surgery as well as vaginal brachytherapy and chemotherapy. Recent imaging has revealed evidence of progressive disease. Patient also has poor venous access and presents today for Port-A-Cath placement for additional chemotherapy.Risks and benefits discussed with the patient/family including, but not limited to bleeding, infection, pneumothorax, or fibrin sheath development and need for additional procedures.All of the patient's questions were answered, patient is agreeable to proceed.Consent signed and in chart.     Thank you for this interesting consult.  I greatly enjoyed meeting JERAMIE BULLARD and look forward to participating in their care.  A copy of  this report was sent to the requesting provider on this date.  Electronically Signed: D. Rowe Robert 04/05/2016, 2:22 PM   I spent a total of 20 minutes in face to face in clinical consultation, greater than 50% of which was counseling/coordinating care for Port-A-Cath placement

## 2016-04-05 NOTE — Discharge Instructions (Signed)
Moderate Conscious Sedation, Adult, Care After °Refer to this sheet in the next few weeks. These instructions provide you with information on caring for yourself after your procedure. Your health care provider may also give you more specific instructions. Your treatment has been planned according to current medical practices, but problems sometimes occur. Call your health care provider if you have any problems or questions after your procedure. °WHAT TO EXPECT AFTER THE PROCEDURE  °After your procedure: °· You may feel sleepy, clumsy, and have poor balance for several hours. °· Vomiting may occur if you eat too soon after the procedure. °HOME CARE INSTRUCTIONS °· Do not participate in any activities where you could become injured for at least 24 hours. Do not: °¨ Drive. °¨ Swim. °¨ Ride a bicycle. °¨ Operate heavy machinery. °¨ Cook. °¨ Use power tools. °¨ Climb ladders. °¨ Work from a high place. °· Do not make important decisions or sign legal documents until you are improved. °· If you vomit, drink water, juice, or soup when you can drink without vomiting. Make sure you have little or no nausea before eating solid foods. °· Only take over-the-counter or prescription medicines for pain, discomfort, or fever as directed by your health care provider. °· Make sure you and your family fully understand everything about the medicines given to you, including what side effects may occur. °· You should not drink alcohol, take sleeping pills, or take medicines that cause drowsiness for at least 24 hours. °· If you smoke, do not smoke without supervision. °· If you are feeling better, you may resume normal activities 24 hours after you were sedated. °· Keep all appointments with your health care provider. °SEEK MEDICAL CARE IF: °· Your skin is pale or bluish in color. °· You continue to feel nauseous or vomit. °· Your pain is getting worse and is not helped by medicine. °· You have bleeding or swelling. °· You are still  sleepy or feeling clumsy after 24 hours. °SEEK IMMEDIATE MEDICAL CARE IF: °· You develop a rash. °· You have difficulty breathing. °· You develop any type of allergic problem. °· You have a fever. °MAKE SURE YOU: °· Understand these instructions. °· Will watch your condition. °· Will get help right away if you are not doing well or get worse. °  °This information is not intended to replace advice given to you by your health care provider. Make sure you discuss any questions you have with your health care provider. °  °Document Released: 07/23/2013 Document Revised: 10/23/2014 Document Reviewed: 07/23/2013 °Elsevier Interactive Patient Education ©2016 [employer] °Implanted Port Insertion, Care After °Refer to this sheet in the next few weeks. These instructions provide you with information on caring for yourself after your procedure. Your health care provider may also give you more specific instructions. Your treatment has been planned according to current medical practices, but problems sometimes occur. Call your health care provider if you have any problems or questions after your procedure. °WHAT TO EXPECT AFTER THE PROCEDURE °After your procedure, it is typical to have the following:  °· Discomfort at the port insertion site. Ice packs to the area will help. °· Bruising on the skin over the port. This will subside in 3-4 days. °HOME CARE INSTRUCTIONS °· After your port is placed, you will get a manufacturer's information card. The card has information about your port. Keep this card with you at all times.   °· Know what kind of port you have. There are many types   of ports available.   °· Wear a medical alert bracelet in case of an emergency. This can help alert health care workers that you have a port.   °· The port can stay in for as long as your health care provider believes it is necessary.   °· A home health care nurse may give medicines and take care of the port.   °· You or a family member can get  special training and directions for giving medicine and taking care of the port at home.   °SEEK MEDICAL CARE IF:  °· Your port does not flush or you are unable to get a blood return.   °· You have a fever or chills. °SEEK IMMEDIATE MEDICAL CARE IF: °· You have new fluid or pus coming from your incision.   °· You notice a bad smell coming from your incision site.   °· You have swelling, pain, or more redness at the incision or port site.   °· You have chest pain or shortness of breath. °  °This information is not intended to replace advice given to you by your health care provider. Make sure you discuss any questions you have with your health care provider. °  °Document Released: 07/23/2013 Document Revised: 10/07/2013 Document Reviewed: 07/23/2013 °Elsevier Interactive Patient Education ©2016 [employer] °Implanted Port Home Guide °An implanted port is a type of central line that is placed under the skin. Central lines are used to provide IV access when treatment or nutrition needs to be given through a person's veins. Implanted ports are used for long-term IV access. An implanted port may be placed because:  °· You need IV medicine that would be irritating to the small veins in your hands or arms.   °· You need long-term IV medicines, such as antibiotics.   °· You need IV nutrition for a long period.   °· You need frequent blood draws for lab tests.   °· You need dialysis.   °Implanted ports are usually placed in the chest area, but they can also be placed in the upper arm, the abdomen, or the leg. An implanted port has two main parts:  °· Reservoir. The reservoir is round and will appear as a small, raised area under your skin. The reservoir is the part where a needle is inserted to give medicines or draw blood.   °· Catheter. The catheter is a thin, flexible tube that extends from the reservoir. The catheter is placed into a large vein. Medicine that is inserted into the reservoir goes into the catheter and  then into the vein.   °HOW WILL I CARE FOR MY INCISION SITE? °Do not get the incision site wet. Bathe or shower as directed by your health care provider.  °HOW IS MY PORT ACCESSED? °Special steps must be taken to access the port:  °· Before the port is accessed, a numbing cream can be placed on the skin. This helps numb the skin over the port site.   °· Your health care provider uses a sterile technique to access the port. °· Your health care provider must put on a mask and sterile gloves. °· The skin over your port is cleaned carefully with an antiseptic and allowed to dry. °· The port is gently pinched between sterile gloves, and a needle is inserted into the port. °· Only "non-coring" port needles should be used to access the port. Once the port is accessed, a blood return should be checked. This helps ensure that the port is in the vein and is not clogged.   °· If your port   needs to remain accessed for a constant infusion, a clear (transparent) bandage will be placed over the needle site. The bandage and needle will need to be changed every week, or as directed by your health care provider.   °· Keep the bandage covering the needle clean and dry. Do not get it wet. Follow your health care provider's instructions on how to take a shower or bath while the port is accessed.   °· If your port does not need to stay accessed, no bandage is needed over the port.   °WHAT IS FLUSHING? °Flushing helps keep the port from getting clogged. Follow your health care provider's instructions on how and when to flush the port. Ports are usually flushed with saline solution or a medicine called heparin. The need for flushing will depend on how the port is used.  °· If the port is used for intermittent medicines or blood draws, the port will need to be flushed:   °· After medicines have been given.   °· After blood has been drawn.   °· As part of routine maintenance.   °· If a constant infusion is running, the port may not need to  be flushed.   °HOW LONG WILL MY PORT STAY IMPLANTED? °The port can stay in for as long as your health care provider thinks it is needed. When it is time for the port to come out, surgery will be done to remove it. The procedure is similar to the one performed when the port was put in.  °WHEN SHOULD I SEEK IMMEDIATE MEDICAL CARE? °When you have an implanted port, you should seek immediate medical care if:  °· You notice a bad smell coming from the incision site.   °· You have swelling, redness, or drainage at the incision site.   °· You have more swelling or pain at the port site or the surrounding area.   °· You have a fever that is not controlled with medicine. °  °This information is not intended to replace advice given to you by your health care provider. Make sure you discuss any questions you have with your health care provider. °  °Document Released: 10/02/2005 Document Revised: 07/23/2013 Document Reviewed: 06/09/2013 °Elsevier Interactive Patient Education ©2016 [employer] ° °

## 2016-04-09 ENCOUNTER — Other Ambulatory Visit: Payer: Self-pay | Admitting: Oncology

## 2016-04-10 ENCOUNTER — Ambulatory Visit (HOSPITAL_BASED_OUTPATIENT_CLINIC_OR_DEPARTMENT_OTHER): Payer: Commercial Managed Care - HMO | Admitting: Oncology

## 2016-04-10 ENCOUNTER — Ambulatory Visit: Payer: Commercial Managed Care - HMO

## 2016-04-10 ENCOUNTER — Other Ambulatory Visit (HOSPITAL_BASED_OUTPATIENT_CLINIC_OR_DEPARTMENT_OTHER): Payer: Commercial Managed Care - HMO

## 2016-04-10 ENCOUNTER — Ambulatory Visit (HOSPITAL_BASED_OUTPATIENT_CLINIC_OR_DEPARTMENT_OTHER): Payer: Commercial Managed Care - HMO

## 2016-04-10 ENCOUNTER — Encounter: Payer: Self-pay | Admitting: Oncology

## 2016-04-10 ENCOUNTER — Other Ambulatory Visit: Payer: Self-pay | Admitting: *Deleted

## 2016-04-10 VITALS — BP 154/79 | HR 67 | Temp 98.4°F | Resp 18 | Ht 66.0 in | Wt 166.6 lb

## 2016-04-10 DIAGNOSIS — C801 Malignant (primary) neoplasm, unspecified: Principal | ICD-10-CM

## 2016-04-10 DIAGNOSIS — D6481 Anemia due to antineoplastic chemotherapy: Secondary | ICD-10-CM

## 2016-04-10 DIAGNOSIS — D631 Anemia in chronic kidney disease: Secondary | ICD-10-CM

## 2016-04-10 DIAGNOSIS — D701 Agranulocytosis secondary to cancer chemotherapy: Secondary | ICD-10-CM

## 2016-04-10 DIAGNOSIS — Z5111 Encounter for antineoplastic chemotherapy: Secondary | ICD-10-CM

## 2016-04-10 DIAGNOSIS — C541 Malignant neoplasm of endometrium: Secondary | ICD-10-CM

## 2016-04-10 DIAGNOSIS — R180 Malignant ascites: Secondary | ICD-10-CM

## 2016-04-10 DIAGNOSIS — G620 Drug-induced polyneuropathy: Secondary | ICD-10-CM

## 2016-04-10 DIAGNOSIS — C786 Secondary malignant neoplasm of retroperitoneum and peritoneum: Secondary | ICD-10-CM | POA: Diagnosis not present

## 2016-04-10 DIAGNOSIS — T451X5A Adverse effect of antineoplastic and immunosuppressive drugs, initial encounter: Secondary | ICD-10-CM

## 2016-04-10 DIAGNOSIS — Z95828 Presence of other vascular implants and grafts: Secondary | ICD-10-CM

## 2016-04-10 LAB — CBC WITH DIFFERENTIAL/PLATELET
BASO%: 0.2 % (ref 0.0–2.0)
BASOS ABS: 0 10*3/uL (ref 0.0–0.1)
EOS%: 1.7 % (ref 0.0–7.0)
Eosinophils Absolute: 0.1 10*3/uL (ref 0.0–0.5)
HEMATOCRIT: 31.6 % — AB (ref 34.8–46.6)
HEMOGLOBIN: 10.4 g/dL — AB (ref 11.6–15.9)
LYMPH#: 0.7 10*3/uL — AB (ref 0.9–3.3)
LYMPH%: 14 % (ref 14.0–49.7)
MCH: 31.9 pg (ref 25.1–34.0)
MCHC: 33 g/dL (ref 31.5–36.0)
MCV: 96.4 fL (ref 79.5–101.0)
MONO#: 0.4 10*3/uL (ref 0.1–0.9)
MONO%: 7.9 % (ref 0.0–14.0)
NEUT%: 76.2 % (ref 38.4–76.8)
NEUTROS ABS: 3.8 10*3/uL (ref 1.5–6.5)
Platelets: 222 10*3/uL (ref 145–400)
RBC: 3.28 10*6/uL — ABNORMAL LOW (ref 3.70–5.45)
RDW: 13.3 % (ref 11.2–14.5)
WBC: 5 10*3/uL (ref 3.9–10.3)

## 2016-04-10 LAB — COMPREHENSIVE METABOLIC PANEL
ALBUMIN: 3.5 g/dL (ref 3.5–5.0)
ALK PHOS: 48 U/L (ref 40–150)
ALT: 14 U/L (ref 0–55)
AST: 30 U/L (ref 5–34)
Anion Gap: 7 mEq/L (ref 3–11)
BILIRUBIN TOTAL: 0.51 mg/dL (ref 0.20–1.20)
BUN: 12.3 mg/dL (ref 7.0–26.0)
CALCIUM: 9.2 mg/dL (ref 8.4–10.4)
CO2: 27 mEq/L (ref 22–29)
CREATININE: 0.8 mg/dL (ref 0.6–1.1)
Chloride: 105 mEq/L (ref 98–109)
EGFR: 84 mL/min/{1.73_m2} — ABNORMAL LOW (ref >90)
GLUCOSE: 121 mg/dL (ref 70–140)
POTASSIUM: 4.2 meq/L (ref 3.5–5.1)
SODIUM: 139 meq/L (ref 136–145)
TOTAL PROTEIN: 7 g/dL (ref 6.4–8.3)

## 2016-04-10 MED ORDER — SODIUM CHLORIDE 0.9% FLUSH
10.0000 mL | INTRAVENOUS | Status: DC | PRN
  Administered 2016-04-10: 10 mL
  Filled 2016-04-10: qty 10

## 2016-04-10 MED ORDER — FERROCITE 324 MG PO TABS: 1.0000 | ORAL_TABLET | Freq: Every day | ORAL | Status: DC

## 2016-04-10 MED ORDER — DOXORUBICIN HCL LIPOSOMAL CHEMO INJECTION 2 MG/ML
43.0000 mg/m2 | Freq: Once | INTRAVENOUS | Status: AC
  Administered 2016-04-10: 80 mg via INTRAVENOUS
  Filled 2016-04-10: qty 40

## 2016-04-10 MED ORDER — DEXTROSE 5 % IV SOLN
INTRAVENOUS | Status: DC
  Administered 2016-04-10: 11:00:00 via INTRAVENOUS

## 2016-04-10 MED ORDER — SODIUM CHLORIDE 0.9 % IV SOLN: Freq: Once | INTRAVENOUS | Status: DC

## 2016-04-10 MED ORDER — SODIUM CHLORIDE 0.9 % IV SOLN
Freq: Once | INTRAVENOUS | Status: AC
  Administered 2016-04-10: 12:00:00 via INTRAVENOUS
  Filled 2016-04-10: qty 4

## 2016-04-10 MED ORDER — SODIUM CHLORIDE 0.9 % IJ SOLN
10.0000 mL | INTRAMUSCULAR | Status: DC | PRN
  Administered 2016-04-10: 10 mL via INTRAVENOUS
  Filled 2016-04-10: qty 10

## 2016-04-10 MED ORDER — HEPARIN SOD (PORK) LOCK FLUSH 100 UNIT/ML IV SOLN
500.0000 [IU] | Freq: Once | INTRAVENOUS | Status: AC | PRN
  Administered 2016-04-10: 500 [IU]
  Filled 2016-04-10: qty 5

## 2016-04-10 NOTE — Patient Instructions (Signed)

## 2016-04-10 NOTE — Patient Instructions (Signed)
[facility] Discharge Instructions for Patients Receiving Chemotherapy  Today you received the following chemotherapy agents: Doxil.  To help prevent nausea and vomiting after your treatment, we encourage you to take your nausea medication as directed.  If you develop nausea and vomiting that is not controlled by your nausea medication, call the clinic.   BELOW ARE SYMPTOMS THAT SHOULD BE REPORTED IMMEDIATELY:  *FEVER GREATER THAN 100.5 F  *CHILLS WITH OR WITHOUT FEVER  NAUSEA AND VOMITING THAT IS NOT CONTROLLED WITH YOUR NAUSEA MEDICATION  *UNUSUAL SHORTNESS OF BREATH  *UNUSUAL BRUISING OR BLEEDING  TENDERNESS IN MOUTH AND THROAT WITH OR WITHOUT PRESENCE OF ULCERS  *URINARY PROBLEMS  *BOWEL PROBLEMS  UNUSUAL RASH Items with * indicate a potential emergency and should be followed up as soon as possible.  Feel free to call the clinic you have any questions or concerns. The clinic phone number is [phone].  Please show the CHEMO ALERT CARD at check-in to the Emergency Department and triage nurse.   Doxorubicin Liposomal injection What is this medicine? LIPOSOMAL DOXORUBICIN (LIP oh som al dox oh ROO bi sin) is a chemotherapy drug. This medicine is used to treat many kinds of cancer like Kaposi's sarcoma, multiple myeloma, and ovarian cancer. This medicine may be used for other purposes; ask your health care provider or pharmacist if you have questions. What should I tell my health care provider before I take this medicine? They need to know if you have any of these conditions: -blood disorders -heart disease -infection (especially a virus infection such as chickenpox, cold sores, or herpes) -liver disease -recent or ongoing radiation therapy -an unusual or allergic reaction to doxorubicin, other chemotherapy agents, soybeans, other medicines, foods, dyes, or preservatives -pregnant or trying to get pregnant -breast-feeding How should I use this  medicine? This drug is given as an infusion into a vein. It is administered in a hospital or clinic by a specially trained health care professional. If you have pain, swelling, burning or any unusual feeling around the site of your injection, tell your health care professional right away. Talk to your pediatrician regarding the use of this medicine in children. Special care may be needed. Overdosage: If you think you have taken too much of this medicine contact a poison control center or emergency room at once. NOTE: This medicine is only for you. Do not share this medicine with others. What if I miss a dose? It is important not to miss your dose. Call your doctor or health care professional if you are unable to keep an appointment. What may interact with this medicine? Do not take this medicine with any of the following medications: -zidovudine This medicine may also interact with the following medications: -medicines to increase blood counts like filgrastim, pegfilgrastim, sargramostim -vaccines Talk to your doctor or health care professional before taking any of these medicines: -acetaminophen -aspirin -ibuprofen -ketoprofen -naproxen This list may not describe all possible interactions. Give your health care provider a list of all the medicines, herbs, non-prescription drugs, or dietary supplements you use. Also tell them if you smoke, drink alcohol, or use illegal drugs. Some items may interact with your medicine. What should I watch for while using this medicine? Your condition will be monitored carefully while you are receiving this medicine. You will need important blood work done while you are taking this medicine. This drug may make you feel generally unwell. This is not uncommon, as chemotherapy can affect healthy cells as well as   cancer cells. Report any side effects. Continue your course of treatment even though you feel ill unless your doctor tells you to stop. Your urine may  turn orange-red for a few days after your dose. This is not blood. If your urine is dark or brown, call your doctor. In some cases, you may be given additional medicines to help with side effects. Follow all directions for their use. Call your doctor or health care professional for advice if you get a fever (100.5 degrees F or higher), chills or sore throat, or other symptoms of a cold or flu. Do not treat yourself. This drug decreases your body's ability to fight infections. Try to avoid being around people who are sick. This medicine may increase your risk to bruise or bleed. Call your doctor or health care professional if you notice any unusual bleeding. Be careful brushing and flossing your teeth or using a toothpick because you may get an infection or bleed more easily. If you have any dental work done, tell your dentist you are receiving this medicine. Avoid taking products that contain aspirin, acetaminophen, ibuprofen, naproxen, or ketoprofen unless instructed by your doctor. These medicines may hide a fever. Men and women of childbearing age should use effective birth control methods while using taking this medicine. Do not become pregnant while taking this medicine. There is a potential for serious side effects to an unborn child. Talk to your health care professional or pharmacist for more information. Do not breast-feed an infant while taking this medicine. Talk to your doctor about your risk of cancer. You may be more at risk for certain types of cancers if you take this medicine. What side effects may I notice from receiving this medicine? Side effects that you should report to your doctor or health care professional as soon as possible: -allergic reactions like skin rash, itching or hives, swelling of the face, lips, or tongue -low blood counts - this medicine may decrease the number of white blood cells, red blood cells and platelets. You may be at increased risk for infections and  bleeding. -signs of hand-foot syndrome - tingling or burning, redness, flaking, swelling, small blisters, or small sores on the palms of your hands or the soles of your feet -signs of infection - fever or chills, cough, sore throat, pain or difficulty passing urine -signs of decreased platelets or bleeding - bruising, pinpoint red spots on the skin, black, tarry stools, blood in the urine -signs of decreased red blood cells - unusually weak or tired, fainting spells, lightheadedness -back pain, chills, facial flushing, fever, headache, tightness in the chest or throat during the infusion -breathing problems -chest pain -fast, irregular heartbeat -mouth pain, redness, sores -pain, swelling, redness at site where injected -pain, tingling, numbness in the hands or feet -swelling of ankles, feet, or hands -vomiting Side effects that usually do not require medical attention (report to your doctor or health care professional if they continue or are bothersome): -diarrhea -hair loss -loss of appetite -nail discoloration or damage -nausea -red or watery eyes -red colored urine -stomach upset This list may not describe all possible side effects. Call your doctor for medical advice about side effects. You may report side effects to FDA at 1-800-FDA-1088. Where should I keep my medicine? This drug is given in a hospital or clinic and will not be stored at home. NOTE: This sheet is a summary. It may not cover all possible information. If you have questions about this medicine, talk to your   doctor, pharmacist, or health care provider.    2016, [employer]vier/[employer] Standard. (2012-06-21 10:12:56)  

## 2016-04-10 NOTE — Progress Notes (Signed)
OFFICE PROGRESS NOTE   April 12, 2016   Physicians: W.Brewster/ Emma Rossi,Plotnikov, [name], MD, Gery Pray, [place] [facility] [name]), [name]  INTERVAL HISTORY:  Patient is seen, together with husband, in continuing attention to recurrent high grade endometrial carcinoma. She has recently progressed on carbo taxol after initial good partial response to those agents. She saw Dr Skeet Latch on 03-22-16 and had CT AP on 03-31-16. She had PAC placed by IR on 04-05-16. Echocardiogram 09-28-15 good EF. She has had doxil teaching by RN, including written information. I discussed situation directly with Dr Skeet Latch prior to this visit.   Patient continues to feel generally well, without bothersome symptoms from the progressive gyn cancer. PAC placement was not complicated and initial soreness at site has improved. 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 Appetite is good without early satiety, no nausea, bowels moving regularly. No SOB, no bleeding, no LE swelling, no fever or symptoms of infection.   Remainder of 10 point Review of Systems negative/ unchanged.   PAC 04-05-16 No genetics testing. MMR normal on surgical path 04-2015  CA 125 09-14-15 2427 Cytology ER negative 08-2015  ONCOLOGIC HISTORY Patient had regular medical care by PCP Dr Alain Marion and gyn Dr Garwin Brothers. In spring 2016, she noticed low pelvic discomfort and some intermittent vaginal spotting. She was seen for regular visit by Dr Garwin Brothers in May 2016, with PAP positive for AGUS and US showing endometrial stripe 8.62m. Endometrial biopsy 03-12-15 showed high grade adenocarcinoma with papillary features, however immunostains were not typical for serous carcinoma. She was seen by Dr RDenman Georgein June. CT CAP 04-14-15 showed no evidence of metastatic disease in chest, a 3.3 x 2.8 cm endometrial mas and 3.1 cm cystic lesion of right ovary. She had robotic total laparoscopic hysterectomy BSO with  [facility]ilateral [facility] and [facility] aortic node [facility] Pathology ([phone] grade 2 endometrioid adenocarcinoma invading 0.35 cm where myometrium 1.1 cm, 26 nodes negative and no LVSI. She had intracavitary radiation due to risk for vaginal cuff recurrence from 06-01-15 thru 07-01-15, vaginal cuff 27.5 gray in 5 fractions. There were no obvious concerns when she was seen by Dr RDenman Georgein late July or Dr KSondra Comeand Dr PAlain Marionin October. She presented to ED on 09-06-15 with generalized abdominal pain, bloating and constipation x 1 month. CT AP 09-06-15 showed extensive nodularity and stranding thruout omentum and large volume ascites, with liver/spleen/pancreas/adrenals not remarkable, no bowel obstruction. CA 125 was 2427. She had UKoreaparacentesi on 09-07-15 for 5 liters of serous fluid, cytology (([id number] adenocarcinoma. She had consultation with Dr RDenman Georgeon 09-13-15, recommendation for systemic chemotherapy. Review of cytology from ascites compared with surgical specimen and [facility] high grade features that were seen on the initial endometrial biopsy (not seen on the full surgical path); ER negative. She had repeat UKoreaparacentesis for 5.2 liters on 09-20-15. She had first carboplatin taxol on 09-21-15 and was neutropenic by day 8 cycle 1, granix begun. She had cycle 2 at same doses (carbo AUC = 3.5) 0n 10-13-15 with neulasta, again neutropenic such that doses were adjusted subsequently. CT AP 01-12-16 after cycle 6 showed improvement, tho still evidence of disease; CA 125 was 683 with cycle 6 chemo. She received additional 3 cycles thru cycle 9 on 03-07-16, with progressive peripheral neuropathy despite decrease in taxol dose, and increase in CA 125 to 713 on 03-20-16. CT  AP 03-31-16 confirmed progression of omental and peritoneal involvement and some increase in ascites.      Objective:  Vital signs in last 24  hours: Weight up 5 labs to 166.9. 154/79, 67 regular, 18 not labored RA, 98.4  Alert, oriented and appropriate. Ambulatory without assistance.  Alopecia  HEENT:PERRL, sclerae not icteric. Oral mucosa moist without lesions, posterior pharynx clear.  Neck supple. No JVD.  Lymphatics:no cervical,supraclavicular or inguinal adenopathy Resp: clear to auscultation bilaterally and normal percussion bilaterally Cardio: regular rate and rhythm. No gallop. GI: soft, nontender, mildly distended, no mass or organomegaly, apparent fluid wave. Normally active bowel sounds. Surgical incision not remarkable. Musculoskeletal/ Extremities: without pitting edema, cords, tenderness Neuro:  peripheral neuropathy feet>fingers. Otherwise nonfocal. PSYCH appropriate mood and affect Skin without rash, ecchymosis, petechiae Portacath-resolving ecchymoses, good position  Lab Results:  Results for orders placed or performed in visit on 04/10/16  CBC with Differential  Result Value Ref Range   WBC 5.0 3.9 - 10.3 10e3/uL   NEUT# 3.8 1.5 - 6.5 10e3/uL   HGB 10.4 (L) 11.6 - 15.9 g/dL   HCT 31.6 (L) 34.8 - 46.6 %   Platelets 222 145 - 400 10e3/uL   MCV 96.4 79.5 - 101.0 fL   MCH 31.9 25.1 - 34.0 pg   MCHC 33.0 31.5 - 36.0 g/dL   RBC 3.28 (L) 3.70 - 5.45 10e6/uL   RDW 13.3 11.2 - 14.5 %   lymph# 0.7 (L) 0.9 - 3.3 10e3/uL   MONO# 0.4 0.1 - 0.9 10e3/uL   Eosinophils Absolute 0.1 0.0 - 0.5 10e3/uL   Basophils Absolute 0.0 0.0 - 0.1 10e3/uL   NEUT% 76.2 38.4 - 76.8 %   LYMPH% 14.0 14.0 - 49.7 %   MONO% 7.9 0.0 - 14.0 %   EOS% 1.7 0.0 - 7.0 %   BASO% 0.2 0.0 - 2.0 %  Comprehensive metabolic panel  Result Value Ref Range   Sodium 139 136 - 145 mEq/L   Potassium 4.2 3.5 - 5.1 mEq/L   Chloride 105 98 - 109 mEq/L   CO2 27 22 - 29 mEq/L   Glucose 121 70 - 140 mg/dl   BUN 12.3 7.0 - 26.0 mg/dL   Creatinine 0.8 0.6 - 1.1 mg/dL   Total Bilirubin 0.51 0.20 - 1.20 mg/dL   Alkaline Phosphatase 48 40 - 150 U/L    AST 30 5 - 34 U/L   ALT 14 0 - 55 U/L   Total Protein 7.0 6.4 - 8.3 g/dL   Albumin 3.5 3.5 - 5.0 g/dL   Calcium 9.2 8.4 - 10.4 mg/dL   Anion Gap 7 3 - 11 mEq/L   EGFR 84 (L) >90 ml/min/1.73 m2     Studies/Results: EXAM: CT ABDOMEN AND PELVIS WITH CONTRAST  TECHNIQUE: Multidetector CT imaging of the abdomen and pelvis was performed using the standard protocol following bolus administration of intravenous contrast.  CONTRAST: 155m ISOVUE-300 IOPAMIDOL (ISOVUE-300) INJECTION 61%  COMPARISON: 01/12/2016  FINDINGS: Lower chest: Lung bases are clear.  Hepatobiliary: Liver is within normal limits.  Status post cholecystectomy. No intrahepatic ductal dilatation. Dilated common duct, measuring 11 mm, but smoothly tapering at the ampulla.  Pancreas: Within normal limits.  Spleen: 5 mm probable cyst in the central spleen.  Adrenals/Urinary Tract: Adrenal glands are within normal limits.  Kidneys are within normal limits. No hydronephrosis.  Bladder is unremarkable.  Stomach/Bowel: Stomach is notable for a small hiatal hernia.  No evidence of bowel obstruction.  Normal appendix (series 2/image  19).  Left colon is mildly thick-walled/underdistended.  Vascular/Lymphatic: Atherosclerotic calcifications of the abdominal aorta and branch vessels. No evidence of abdominal aortic aneurysm.  No suspicious abdominopelvic lymphadenopathy.  Reproductive: Status post hysterectomy.  No adnexal masses.  Other: Small to moderate abdominopelvic ascites, mildly increased.  Associated peritoneal nodularity/ omental caking (series 2/ image 37), minimally increased from recent CT.  Additional mild pelvic nodularity/ thickening along the dependent pelvis (series 2/image 72), including a discrete 10 mm nodule (series 2/image 66), new/increased.  Small fat containing bilateral inguinal hernias, left greater than right.  Musculoskeletal: Mild  degenerative changes the lower thoracic spine.  IMPRESSION: Status post hysterectomy.  Small to moderate abdominopelvic ascites, mildly increased.  Associated peritoneal nodularity/ omental caking, mildly progressed.  Additional ancillary findings as above.    PACs images reviewed by MD. Patient and husband did not want to see images.     Medications: I have reviewed the patient's current medications.  DISCUSSION CT findings discussed, confirming clinical suspicion that the cancer has begun to progress despite taxol carboplatin now. Patient and husband understand that further treatment will be in attempt to control and improve disease, but will not be curative; Dr Skeet Latch told them that any additional agents have at most a 20% chance of helping.  I have carefully reviewed skin care with doxil. NOTE patient and husband are not always fully compliant with instructions. I have recommended lotion with olive oil or coconut oil.. I have mentioned oral ice and ice packs to hands and feet with subsequent doxil treatments, as symptoms tend to be cumulative. We have reviewed other possible side effects of doxil and she has given verbal consent. She is pleased that doxil should not cause aches, peripheral neuropathy or probably significant cytopenias.  She is pleased with PAC.  Did not discuss avastin today, tho that may be reasonable to add starting ~ cycle 2 [facility] 04-05-16).   Assessment/Plan:  1. 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 Carbo taxol x 9 cycles from 09-21-15 thru 03-07-16,required dose adjustment and neulasta support CT AP 03-31-16 progression. Treatment changing to doxil, cycle 1 04-10-16; consider adding avastin with cycle 2 (after PAC 6-21). I will see her with counts ~ 2 wks. 2. Chemo neutropenia has required dose reduction in carboplatin  and neulasta. Previous pelvic RT likely impacting counts. Doxil likely will be easier on counts 3.Admission for tachycardia after cycle 1, HR sustained 150-160, no other findings and resolved when beta blocker was resumed, this DCd in error by patient. 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 Managed by Dr Alain Marion 8.post cholecystectomy and up to date colonoscopy and mammograms 9..squamous cell carcinoma anus 2003: radiation and may have had chemo then, may be impacting counts with present chemo. 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 Slight improvement already since off of taxol. Follow. 11.does not have advance directives per EMR  All questions answered, verbal consent for chemo. Doxil orders confirmed, preauthorization done. Time spent 40 min including >50% counseling and coordination of care. Route PCP  Gordy Levan, MD   04/12/2016, 1:53 PM

## 2016-04-11 ENCOUNTER — Ambulatory Visit (HOSPITAL_COMMUNITY): Payer: Commercial Managed Care - HMO

## 2016-04-12 ENCOUNTER — Telehealth: Payer: Self-pay

## 2016-04-12 MED ORDER — FERROCITE 324 MG PO TABS: 1.0000 | ORAL_TABLET | Freq: Every day | ORAL | Status: AC

## 2016-04-12 NOTE — Telephone Encounter (Signed)
-----   Message from Gordy Levan, MD sent at 04/12/2016  2:02 PM EDT ----- Refill iron x 4 please

## 2016-04-13 ENCOUNTER — Ambulatory Visit: Payer: Commercial Managed Care - HMO | Admitting: Gynecologic Oncology

## 2016-04-17 ENCOUNTER — Ambulatory Visit: Payer: Commercial Managed Care - HMO | Admitting: Oncology

## 2016-04-17 ENCOUNTER — Other Ambulatory Visit: Payer: Commercial Managed Care - HMO

## 2016-04-19 ENCOUNTER — Other Ambulatory Visit: Payer: Self-pay | Admitting: Oncology

## 2016-04-19 ENCOUNTER — Telehealth: Payer: Self-pay | Admitting: Oncology

## 2016-04-19 ENCOUNTER — Telehealth: Payer: Self-pay

## 2016-04-19 NOTE — Telephone Encounter (Signed)
per pof to move flush appt-moved pt appt to 9:15-cld & spoke w/ptt and gave appt time for 7/6

## 2016-04-19 NOTE — Telephone Encounter (Signed)
-----   Message from Gordy Levan, MD sent at 04/19/2016 12:01 PM EDT ----- #2 message   Can't tell that she got a follow up call for first doxil on 6-26. I see her 7-6 if you can't reach her today thanks

## 2016-04-19 NOTE — Telephone Encounter (Signed)
Pt has been doing well over last week. She developed a little hurting in her stomach. She used lorazepam which helped. She mentioned taking a little pepto-bisbol. Discussed she has not been taking her protonix b/c she has not had heartburn, suggested trying that as well for stomach hurting. She has lost some appetite. Discussed frequent small snacks. She is drinking her ensure. She is moving her bowels, drinking water, no fatigue. She has appt with Dr Marko Plume tomorrow.

## 2016-04-20 ENCOUNTER — Telehealth: Payer: Self-pay | Admitting: Oncology

## 2016-04-20 ENCOUNTER — Encounter: Payer: Self-pay | Admitting: Oncology

## 2016-04-20 ENCOUNTER — Ambulatory Visit: Payer: Commercial Managed Care - HMO

## 2016-04-20 ENCOUNTER — Other Ambulatory Visit: Payer: Commercial Managed Care - HMO

## 2016-04-20 ENCOUNTER — Other Ambulatory Visit (HOSPITAL_BASED_OUTPATIENT_CLINIC_OR_DEPARTMENT_OTHER): Payer: Commercial Managed Care - HMO

## 2016-04-20 ENCOUNTER — Ambulatory Visit (HOSPITAL_BASED_OUTPATIENT_CLINIC_OR_DEPARTMENT_OTHER): Payer: Commercial Managed Care - HMO | Admitting: Oncology

## 2016-04-20 VITALS — BP 159/76 | HR 86 | Temp 98.4°F | Resp 18 | Ht 66.0 in | Wt 165.3 lb

## 2016-04-20 DIAGNOSIS — C541 Malignant neoplasm of endometrium: Secondary | ICD-10-CM

## 2016-04-20 DIAGNOSIS — Z95828 Presence of other vascular implants and grafts: Secondary | ICD-10-CM

## 2016-04-20 DIAGNOSIS — R180 Malignant ascites: Secondary | ICD-10-CM

## 2016-04-20 DIAGNOSIS — D631 Anemia in chronic kidney disease: Secondary | ICD-10-CM

## 2016-04-20 DIAGNOSIS — N189 Chronic kidney disease, unspecified: Secondary | ICD-10-CM | POA: Diagnosis not present

## 2016-04-20 DIAGNOSIS — R188 Other ascites: Secondary | ICD-10-CM

## 2016-04-20 DIAGNOSIS — C786 Secondary malignant neoplasm of retroperitoneum and peritoneum: Secondary | ICD-10-CM | POA: Diagnosis not present

## 2016-04-20 DIAGNOSIS — C801 Malignant (primary) neoplasm, unspecified: Secondary | ICD-10-CM

## 2016-04-20 DIAGNOSIS — D701 Agranulocytosis secondary to cancer chemotherapy: Secondary | ICD-10-CM

## 2016-04-20 DIAGNOSIS — D6481 Anemia due to antineoplastic chemotherapy: Secondary | ICD-10-CM

## 2016-04-20 LAB — CBC WITH DIFFERENTIAL/PLATELET
BASO%: 0.3 % (ref 0.0–2.0)
Basophils Absolute: 0 10*3/uL (ref 0.0–0.1)
EOS%: 3.2 % (ref 0.0–7.0)
Eosinophils Absolute: 0.1 10*3/uL (ref 0.0–0.5)
HCT: 29.6 % — ABNORMAL LOW (ref 34.8–46.6)
HEMOGLOBIN: 9.9 g/dL — AB (ref 11.6–15.9)
LYMPH%: 25.4 % (ref 14.0–49.7)
MCH: 32.4 pg (ref 25.1–34.0)
MCHC: 33.5 g/dL (ref 31.5–36.0)
MCV: 96.9 fL (ref 79.5–101.0)
MONO#: 0.1 10*3/uL (ref 0.1–0.9)
MONO%: 4.2 % (ref 0.0–14.0)
NEUT%: 66.9 % (ref 38.4–76.8)
NEUTROS ABS: 1.7 10*3/uL (ref 1.5–6.5)
Platelets: 173 10*3/uL (ref 145–400)
RBC: 3.05 10*6/uL — AB (ref 3.70–5.45)
RDW: 12.8 % (ref 11.2–14.5)
WBC: 2.6 10*3/uL — AB (ref 3.9–10.3)
lymph#: 0.7 10*3/uL — ABNORMAL LOW (ref 0.9–3.3)

## 2016-04-20 LAB — COMPREHENSIVE METABOLIC PANEL
ALBUMIN: 3.4 g/dL — AB (ref 3.5–5.0)
ALT: 14 U/L (ref 0–55)
ANION GAP: 10 meq/L (ref 3–11)
AST: 33 U/L (ref 5–34)
Alkaline Phosphatase: 57 U/L (ref 40–150)
BILIRUBIN TOTAL: 0.39 mg/dL (ref 0.20–1.20)
BUN: 13.3 mg/dL (ref 7.0–26.0)
CALCIUM: 9.6 mg/dL (ref 8.4–10.4)
CHLORIDE: 102 meq/L (ref 98–109)
CO2: 26 mEq/L (ref 22–29)
CREATININE: 0.9 mg/dL (ref 0.6–1.1)
EGFR: 75 mL/min/{1.73_m2} — ABNORMAL LOW (ref >90)
Glucose: 125 mg/dl (ref 70–140)
Potassium: 3.8 mEq/L (ref 3.5–5.1)
SODIUM: 139 meq/L (ref 136–145)
TOTAL PROTEIN: 7.6 g/dL (ref 6.4–8.3)

## 2016-04-20 NOTE — Telephone Encounter (Signed)
appt made and avs printed °

## 2016-04-20 NOTE — Progress Notes (Signed)
OFFICE PROGRESS NOTE   April 20, 2016   Physicians: W.Brewster/ Emma Rossi,Plotnikov, [name], MD, Gery Pray, [facility] [name]), [name]  INTERVAL HISTORY:   Patient is seen, together with husband, having begun doxil on 04-10-16 for recurrent high grade endometrial carcinoma which recently progressed while on carboplatin taxol. She has had no problems with first doxil.  Patient is very pleased with PAC "I wish I had gotten it sooner". She has had no nausea and no skin irritation with first doxil. Appetite is decreased since chemo tho she makes sure to eat, does drink at least 3 Ensure daily in addition to food. She denies taste disturbance or frank early satiety. She had epigastric pain x1, improved with ativan. Bowels are moving. She denies SOB or chest discomfort. No bleeding.  Remainder of 10 point Review of Systems negative   PAC 04-05-16 No genetics testing. MMR normal on surgical path 04-2015  CA 125 09-14-15 2427 Cytology ER negative 08-2015  Daughter is to be married in [place], [place] on 05-07-16.   ONCOLOGIC HISTORY Patient had regular medical care by PCP Dr Alain Marion and gyn Dr Garwin Brothers. In spring 2016, she noticed low pelvic discomfort and some intermittent vaginal spotting. She was seen for regular visit by Dr Garwin Brothers in May 2016, with PAP positive for AGUS and US showing endometrial stripe 8.85m. Endometrial biopsy 03-12-15 showed high grade adenocarcinoma with papillary features, however immunostains were not typical for serous carcinoma. She was seen by Dr RDenman Georgein June. CT CAP 04-14-15 showed no evidence of metastatic disease in chest, a 3.3 x 2.8 cm endometrial mas and 3.1 cm cystic lesion of right ovary. She had robotic total laparoscopic hysterectomy BSO with bilateral pelvic and [facility] aortic [facility] Pathology ([phone] grade 2 endometrioid adenocarcinoma invading 0.35 cm where myometrium 1.1 cm, 26  nodes negative and no LVSI. She had intracavitary radiation due to risk for vaginal cuff recurrence from 06-01-15 thru 07-01-15, vaginal cuff 27.5 gray in 5 fractions. There were no obvious concerns when she was seen by Dr RDenman Georgein late July or Dr KSondra Comeand Dr PAlain Marionin October. She presented to ED on 09-06-15 with generalized abdominal pain, bloating and constipation x 1 month. CT AP 09-06-15 showed extensive nodularity and stranding thruout omentum and large volume ascites, with liver/spleen/pancreas/adrenals not remarkable, no bowel obstruction. CA 125 was 2427. She had UKoreaparacentesi on 09-07-15 for 5 liters of serous fluid, cytology (([id number] adenocarcinoma. She had consultation with Dr RDenman Georgeon 09-13-15, recommendation for systemic chemotherapy. Review of cytology from ascites compared with surgical specimen [facility] features that were seen on the initial endometrial biopsy (not seen on the full surgical path); ER negative. She had repeat UKoreaparacentesis for 5.2 liters on 09-20-15. She had first carboplatin taxol on 09-21-15 and was neutropenic by day 8 cycle 1, granix begun. She had cycle 2 at same doses (carbo AUC = 3.5) 0n 10-13-15 with neulasta, again neutropenic such that doses were adjusted subsequently. CT AP 01-12-16 after cycle 6 showed improvement, tho still evidence of disease; CA 125 was 683 with cycle 6 chemo. She received additional 3 cycles thru cycle 9 on 03-07-16, with progressive peripheral neuropathy despite decrease in taxol dose, and increase in CA 125 to 713 on 03-20-16. CT AP 03-31-16 confirmed progression of omental and peritoneal involvement and some increase in ascites. She began doxil on 04-10-16.  Objective:  Vital signs in last 24 hours:  BP 159/76 mmHg  Pulse 86  Temp(Src) 98.4 F (36.9 C) (Oral)  Resp 18  Ht 5\' 6"  (1.676 m)  Wt 165 lb 4.8 oz (74.98 kg)  BMI 26.69 kg/m2  SpO2 100% Weight  down 1 lb. Alert, oriented and appropriate. Ambulatory without assistance difficulty.  Alopecia  HEENT:PERRL, sclerae not icteric. Oral mucosa moist without lesions, posterior pharynx clear.  Neck supple. No JVD.  Lymphatics:no cervical,supraclavicular or inguinal adenopathy Resp: clear to auscultation bilaterally and normal percussion bilaterally Cardio: regular rate and rhythm. No gallop. GI: soft, nontender, mildly distended as previously, not tight. Normally active bowel sounds. Surgical incision not remarkable. Musculoskeletal/ Extremities: without pitting edema, cords, tenderness Neuro: no increase peripheral neuropathy. Otherwise nonfocal. PSYCH appropriate mood and affect Skin without rash, ecchymosis, petechiae Portacath- nearly resolved ecchymosis from placement, good position, surgical glue intact  Lab Results:  Results for orders placed or performed in visit on 04/20/16  CBC with Differential  Result Value Ref Range   WBC 2.6 (L) 3.9 - 10.3 10e3/uL   NEUT# 1.7 1.5 - 6.5 10e3/uL   HGB 9.9 (L) 11.6 - 15.9 g/dL   HCT 06/21/16 (L) 11.4 - 68.5 %   Platelets 173 145 - 400 10e3/uL   MCV 96.9 79.5 - 101.0 fL   MCH 32.4 25.1 - 34.0 pg   MCHC 33.5 31.5 - 36.0 g/dL   RBC 55.7 (L) 3.71 - 7.30 10e6/uL   RDW 12.8 11.2 - 14.5 %   lymph# 0.7 (L) 0.9 - 3.3 10e3/uL   MONO# 0.1 0.1 - 0.9 10e3/uL   Eosinophils Absolute 0.1 0.0 - 0.5 10e3/uL   Basophils Absolute 0.0 0.0 - 0.1 10e3/uL   NEUT% 66.9 38.4 - 76.8 %   LYMPH% 25.4 14.0 - 49.7 %   MONO% 4.2 0.0 - 14.0 %   EOS% 3.2 0.0 - 7.0 %   BASO% 0.3 0.0 - 2.0 %  Comprehensive metabolic panel  Result Value Ref Range   Sodium 139 136 - 145 mEq/L   Potassium 3.8 3.5 - 5.1 mEq/L   Chloride 102 98 - 109 mEq/L   CO2 26 22 - 29 mEq/L   Glucose 125 70 - 140 mg/dl   BUN 7.03 7.0 - 37.7 mg/dL   Creatinine 0.9 0.6 - 1.1 mg/dL   Total Bilirubin 80.7 0.20 - 1.20 mg/dL   Alkaline Phosphatase 57 40 - 150 U/L   AST 33 5 - 34 U/L   ALT 14 0 - 55  U/L   Total Protein 7.6 6.4 - 8.3 g/dL   Albumin 3.4 (L) 3.5 - 5.0 g/dL   Calcium 9.6 8.4 - 8.12 mg/dL   Anion Gap 10 3 - 11 mEq/L   EGFR 75 (L) >90 ml/min/1.73 m2     Studies/Results:  No results found.  Medications: I have reviewed the patient's current medications.  DISCUSSION: reviewed skin care with doxil, discussed oral ice and ice packs to hands and feet beginning cycle 2 or at least cycle 3.  Will check counts on ~ 7-20 prior to their trip to [place] for daughter's wedding 7-23.  She will have second doxil week of 7-24 as long as ANC >=1.5 and plt >=100k  Could consider adding avastin with subsequent treatments  Assessment/Plan: 1. 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 Carbo taxol x 9 cycles from 09-21-15 thru 03-07-16,required dose adjustment and  neulasta support CT AP 03-31-16 progression. Treatment changed to doxil, cycle 1 04-10-16. 2. Chemo neutropenia has required dose reduction in carboplatin and neulasta. Previous pelvic RT likely impacting counts. 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 Managed by Dr Alain Marion 8.post cholecystectomy and up to date colonoscopy and mammograms 9..squamous cell carcinoma anus 2003: radiation and may have had chemo then, may be impacting counts with present chemo. 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 Slight improvement already since off of taxol. Follow. 11.has been given information for advance  directives    All questions answered. Chemo orders placed. Time spent 25 min including >50% counseling and coordination of care.    Maanya Hippert P, MD   04/20/2016, 12:18 PM

## 2016-04-20 NOTE — Progress Notes (Signed)
Pt came in inquiring about assistance for her medical bills.  After reviewing her account she can apply for the hardship settlement program so I gave her an application and offered to send it to the business office thru interoffice mail once she completes the application and provide all the documents needed for processing.  We also discussed the [facility] and [employer] grants and gave her an expense sheet.  She would like to apply so she will bring their bank statement to see if she qualifies.

## 2016-04-22 ENCOUNTER — Encounter: Payer: Self-pay | Admitting: Oncology

## 2016-04-22 NOTE — Progress Notes (Signed)
[facility] END OF TREATMENT   Name: LAURINA GUIA Date: April 22, 2016  MRN: [id number] DOB: June 30, 1941   TREATMENT DATES:   09-21-15 thru 03-07-16   REFERRING PHYSICIAN: W.Brewster  DIAGNOSIS: recurrent high grade endometrial carcinoma  STAGE AT START OF TREATMENT:  IIIC with recurrent disease   INTENT: control   DRUGS OR REGIMENS GIVEN: carbo taxol x 9 cycles   MAJOR TOXICITIES: cytopenias, peripheral neuropathy   REASON TREATMENT STOPPED: progression   PERFORMANCE STATUS AT END: 1   ONGOING PROBLEMS: peripheral neuropathy   FOLLOW UP PLANS: change therapy

## 2016-05-04 ENCOUNTER — Telehealth: Payer: Self-pay

## 2016-05-04 ENCOUNTER — Other Ambulatory Visit (HOSPITAL_BASED_OUTPATIENT_CLINIC_OR_DEPARTMENT_OTHER): Payer: Commercial Managed Care - HMO

## 2016-05-04 DIAGNOSIS — C786 Secondary malignant neoplasm of retroperitoneum and peritoneum: Secondary | ICD-10-CM | POA: Diagnosis not present

## 2016-05-04 DIAGNOSIS — C541 Malignant neoplasm of endometrium: Secondary | ICD-10-CM

## 2016-05-04 DIAGNOSIS — C801 Malignant (primary) neoplasm, unspecified: Secondary | ICD-10-CM

## 2016-05-04 LAB — CBC WITH DIFFERENTIAL/PLATELET
BASO%: 0.3 % (ref 0.0–2.0)
Basophils Absolute: 0 10*3/uL (ref 0.0–0.1)
EOS%: 0.3 % (ref 0.0–7.0)
Eosinophils Absolute: 0 10*3/uL (ref 0.0–0.5)
HEMATOCRIT: 32.5 % — AB (ref 34.8–46.6)
HEMOGLOBIN: 10.9 g/dL — AB (ref 11.6–15.9)
LYMPH#: 0.8 10*3/uL — AB (ref 0.9–3.3)
LYMPH%: 25.7 % (ref 14.0–49.7)
MCH: 32.1 pg (ref 25.1–34.0)
MCHC: 33.5 g/dL (ref 31.5–36.0)
MCV: 95.6 fL (ref 79.5–101.0)
MONO#: 0.7 10*3/uL (ref 0.1–0.9)
MONO%: 21.7 % — ABNORMAL HIGH (ref 0.0–14.0)
NEUT#: 1.7 10*3/uL (ref 1.5–6.5)
NEUT%: 52 % (ref 38.4–76.8)
Platelets: 191 10*3/uL (ref 145–400)
RBC: 3.4 10*6/uL — AB (ref 3.70–5.45)
RDW: 12.8 % (ref 11.2–14.5)
WBC: 3.3 10*3/uL — ABNORMAL LOW (ref 3.9–10.3)

## 2016-05-04 LAB — COMPREHENSIVE METABOLIC PANEL
ALT: 12 U/L (ref 0–55)
ANION GAP: 9 meq/L (ref 3–11)
AST: 27 U/L (ref 5–34)
Albumin: 3.7 g/dL (ref 3.5–5.0)
Alkaline Phosphatase: 51 U/L (ref 40–150)
BILIRUBIN TOTAL: 0.47 mg/dL (ref 0.20–1.20)
BUN: 9.3 mg/dL (ref 7.0–26.0)
CO2: 29 meq/L (ref 22–29)
CREATININE: 1 mg/dL (ref 0.6–1.1)
Calcium: 9.5 mg/dL (ref 8.4–10.4)
Chloride: 99 mEq/L (ref 98–109)
EGFR: 63 mL/min/{1.73_m2} — ABNORMAL LOW (ref >90)
GLUCOSE: 110 mg/dL (ref 70–140)
Potassium: 3.7 mEq/L (ref 3.5–5.1)
SODIUM: 138 meq/L (ref 136–145)
TOTAL PROTEIN: 8 g/dL (ref 6.4–8.3)

## 2016-05-04 NOTE — Telephone Encounter (Signed)
Cbc cmet OK for pt to plan to go to daughters for wedding.  Discussed multiple small meals for loss of apetite. Pt asked if she needs labs on Monday since she did labs today.   S/w Dr Marko Plume and pt does not need labs Monday

## 2016-05-04 NOTE — Telephone Encounter (Signed)
-----   Message from Gordy Levan, MD sent at 04/22/2016  9:42 PM EDT ----- For CBC ~ 7-20, to be sure ok to go to [place] for daughter's wedding 7-23

## 2016-05-08 ENCOUNTER — Other Ambulatory Visit: Payer: Commercial Managed Care - HMO

## 2016-05-08 ENCOUNTER — Ambulatory Visit (HOSPITAL_BASED_OUTPATIENT_CLINIC_OR_DEPARTMENT_OTHER): Payer: Commercial Managed Care - HMO

## 2016-05-08 ENCOUNTER — Ambulatory Visit: Payer: Commercial Managed Care - HMO

## 2016-05-08 VITALS — BP 147/72 | HR 103 | Temp 98.8°F | Resp 18

## 2016-05-08 DIAGNOSIS — Z5111 Encounter for antineoplastic chemotherapy: Secondary | ICD-10-CM | POA: Diagnosis not present

## 2016-05-08 DIAGNOSIS — C801 Malignant (primary) neoplasm, unspecified: Principal | ICD-10-CM

## 2016-05-08 DIAGNOSIS — C541 Malignant neoplasm of endometrium: Secondary | ICD-10-CM | POA: Diagnosis not present

## 2016-05-08 DIAGNOSIS — C786 Secondary malignant neoplasm of retroperitoneum and peritoneum: Secondary | ICD-10-CM

## 2016-05-08 MED ORDER — DOXORUBICIN HCL LIPOSOMAL CHEMO INJECTION 2 MG/ML
37.0000 mg/m2 | Freq: Once | INTRAVENOUS | Status: AC
  Administered 2016-05-08: 70 mg via INTRAVENOUS
  Filled 2016-05-08: qty 10

## 2016-05-08 MED ORDER — SODIUM CHLORIDE 0.9 % IV SOLN
Freq: Once | INTRAVENOUS | Status: AC
  Administered 2016-05-08: 12:00:00 via INTRAVENOUS
  Filled 2016-05-08: qty 4

## 2016-05-08 MED ORDER — SODIUM CHLORIDE 0.9% FLUSH
10.0000 mL | INTRAVENOUS | Status: DC | PRN
  Administered 2016-05-08: 10 mL
  Filled 2016-05-08: qty 10

## 2016-05-08 MED ORDER — HEPARIN SOD (PORK) LOCK FLUSH 100 UNIT/ML IV SOLN
500.0000 [IU] | Freq: Once | INTRAVENOUS | Status: AC | PRN
  Administered 2016-05-08: 500 [IU]
  Filled 2016-05-08: qty 5

## 2016-05-08 MED ORDER — SODIUM CHLORIDE 0.9 % IV SOLN
Freq: Once | INTRAVENOUS | Status: AC
  Administered 2016-05-08: 12:00:00 via INTRAVENOUS

## 2016-05-08 MED ORDER — DEXTROSE 5 % IV SOLN: INTRAVENOUS | Status: DC

## 2016-05-08 NOTE — Patient Instructions (Signed)
[facility] Discharge Instructions for Patients Receiving Chemotherapy  Today you received the following chemotherapy agents: Doxil.  To help prevent nausea and vomiting after your treatment, we encourage you to take your nausea medication: Zofran 8 mg every 8 hours as needed.   If you develop nausea and vomiting that is not controlled by your nausea medication, call the clinic.   BELOW ARE SYMPTOMS THAT SHOULD BE REPORTED IMMEDIATELY:  *FEVER GREATER THAN 100.5 F  *CHILLS WITH OR WITHOUT FEVER  NAUSEA AND VOMITING THAT IS NOT CONTROLLED WITH YOUR NAUSEA MEDICATION  *UNUSUAL SHORTNESS OF BREATH  *UNUSUAL BRUISING OR BLEEDING  TENDERNESS IN MOUTH AND THROAT WITH OR WITHOUT PRESENCE OF ULCERS  *URINARY PROBLEMS  *BOWEL PROBLEMS  UNUSUAL RASH Items with * indicate a potential emergency and should be followed up as soon as possible.  Feel free to call the clinic you have any questions or concerns. The clinic phone number is [phone].  Please show the [facility] at check-in to the Emergency Department and triage nurse.

## 2016-05-10 ENCOUNTER — Other Ambulatory Visit: Payer: Commercial Managed Care - HMO

## 2016-05-11 ENCOUNTER — Ambulatory Visit: Payer: Commercial Managed Care - HMO | Admitting: Internal Medicine

## 2016-05-21 ENCOUNTER — Other Ambulatory Visit: Payer: Self-pay | Admitting: Oncology

## 2016-05-21 DIAGNOSIS — C786 Secondary malignant neoplasm of retroperitoneum and peritoneum: Secondary | ICD-10-CM

## 2016-05-21 DIAGNOSIS — C801 Malignant (primary) neoplasm, unspecified: Secondary | ICD-10-CM

## 2016-05-21 DIAGNOSIS — C541 Malignant neoplasm of endometrium: Secondary | ICD-10-CM

## 2016-05-24 LAB — HM MAMMOGRAPHY

## 2016-05-25 ENCOUNTER — Other Ambulatory Visit (HOSPITAL_BASED_OUTPATIENT_CLINIC_OR_DEPARTMENT_OTHER): Payer: Commercial Managed Care - HMO

## 2016-05-25 ENCOUNTER — Encounter: Payer: Self-pay | Admitting: Oncology

## 2016-05-25 ENCOUNTER — Telehealth: Payer: Self-pay | Admitting: Oncology

## 2016-05-25 ENCOUNTER — Ambulatory Visit (HOSPITAL_BASED_OUTPATIENT_CLINIC_OR_DEPARTMENT_OTHER): Payer: Commercial Managed Care - HMO | Admitting: Oncology

## 2016-05-25 VITALS — BP 160/68 | HR 82 | Temp 98.4°F | Resp 18 | Ht 66.0 in | Wt 161.3 lb

## 2016-05-25 DIAGNOSIS — Z95828 Presence of other vascular implants and grafts: Secondary | ICD-10-CM

## 2016-05-25 DIAGNOSIS — R180 Malignant ascites: Secondary | ICD-10-CM

## 2016-05-25 DIAGNOSIS — C541 Malignant neoplasm of endometrium: Secondary | ICD-10-CM

## 2016-05-25 DIAGNOSIS — D6481 Anemia due to antineoplastic chemotherapy: Secondary | ICD-10-CM

## 2016-05-25 DIAGNOSIS — E639 Nutritional deficiency, unspecified: Secondary | ICD-10-CM | POA: Diagnosis not present

## 2016-05-25 DIAGNOSIS — Z79899 Other long term (current) drug therapy: Secondary | ICD-10-CM

## 2016-05-25 DIAGNOSIS — C786 Secondary malignant neoplasm of retroperitoneum and peritoneum: Secondary | ICD-10-CM | POA: Diagnosis not present

## 2016-05-25 DIAGNOSIS — G620 Drug-induced polyneuropathy: Secondary | ICD-10-CM | POA: Diagnosis not present

## 2016-05-25 DIAGNOSIS — T451X5A Adverse effect of antineoplastic and immunosuppressive drugs, initial encounter: Secondary | ICD-10-CM

## 2016-05-25 DIAGNOSIS — C801 Malignant (primary) neoplasm, unspecified: Secondary | ICD-10-CM

## 2016-05-25 LAB — COMPREHENSIVE METABOLIC PANEL
ALT: 10 U/L (ref 0–55)
ANION GAP: 9 meq/L (ref 3–11)
AST: 26 U/L (ref 5–34)
Albumin: 3.5 g/dL (ref 3.5–5.0)
Alkaline Phosphatase: 47 U/L (ref 40–150)
BUN: 12 mg/dL (ref 7.0–26.0)
CHLORIDE: 104 meq/L (ref 98–109)
CO2: 26 meq/L (ref 22–29)
CREATININE: 0.9 mg/dL (ref 0.6–1.1)
Calcium: 9.8 mg/dL (ref 8.4–10.4)
EGFR: 76 mL/min/{1.73_m2} — ABNORMAL LOW (ref >90)
GLUCOSE: 114 mg/dL (ref 70–140)
Potassium: 3.8 mEq/L (ref 3.5–5.1)
SODIUM: 139 meq/L (ref 136–145)
Total Bilirubin: 0.38 mg/dL (ref 0.20–1.20)
Total Protein: 8 g/dL (ref 6.4–8.3)

## 2016-05-25 LAB — CBC WITH DIFFERENTIAL/PLATELET
BASO%: 0.3 % (ref 0.0–2.0)
BASOS ABS: 0 10*3/uL (ref 0.0–0.1)
EOS%: 1.6 % (ref 0.0–7.0)
Eosinophils Absolute: 0.1 10*3/uL (ref 0.0–0.5)
HEMATOCRIT: 31.9 % — AB (ref 34.8–46.6)
HGB: 10.6 g/dL — ABNORMAL LOW (ref 11.6–15.9)
LYMPH%: 22.5 % (ref 14.0–49.7)
MCH: 31.6 pg (ref 25.1–34.0)
MCHC: 33.2 g/dL (ref 31.5–36.0)
MCV: 95.2 fL (ref 79.5–101.0)
MONO#: 0.4 10*3/uL (ref 0.1–0.9)
MONO%: 12.4 % (ref 0.0–14.0)
NEUT#: 1.9 10*3/uL (ref 1.5–6.5)
NEUT%: 63.2 % (ref 38.4–76.8)
Platelets: 176 10*3/uL (ref 145–400)
RBC: 3.35 10*6/uL — AB (ref 3.70–5.45)
RDW: 13.4 % (ref 11.2–14.5)
WBC: 3.1 10*3/uL — ABNORMAL LOW (ref 3.9–10.3)
lymph#: 0.7 10*3/uL — ABNORMAL LOW (ref 0.9–3.3)

## 2016-05-25 NOTE — Telephone Encounter (Signed)
Added appts to pt schedule per LL 8/10

## 2016-05-25 NOTE — Progress Notes (Signed)
OFFICE PROGRESS NOTE   May 26, 2016   Physicians: W.Brewster/ Emma Rossi,Plotnikov, [name], MD, Gery Pray, [facility] [name]), [name]  INTERVAL HISTORY:  Patient is seen, together with husband, in continuing attention to treatment in process for recurrent high grade endometrial carcinoma, which progressed on carbo taxol after initial good response, now on doxil. She received cycle 2 doxil on 05-08-16, tolerated with no difficulty. She is for cycle 3 doxil on 8-21 and will see Dr Skeet Latch on 06-15-16. She has not needed gCSF with doxil. Echocardiogram 09-28-15 used as baseline for doxil, will need repeat prior to cycle 4 if continues.  Last CT AP was 03-31-16.  Patient did well with cycle 2 doxil, with no skin irritation, no nausea and no other concerns related to the chemotherapy. Skin is rather dry, and we have reviewed all of doxil skin precauttions with her now.  Energy is better, bowels move regularly, sometimes just after she eats. Appetite is not good, but she makes herself eat, generally one protein shake with lactaid milk in AM and evening meal. She denies epigastric or abdominal pain, nausea, early satiety, taste changes. She intentionally tries to drink lots of water, but is not excessively thirsty. We have discussed medication for appetite, however she prefers to try to address with additional protein shake and other pos during middle of day rather than more medication. She denies noticeable increase in abdominal distension, bladder symptoms, any bleeding, swelling LE. No fever or symptoms of infection. Remainder of 10 point Review of Systems negative.    PAC 04-05-16 No genetics testing. MMR normal on surgical path 04-2015  CA 125 09-14-15 2427.    CA 125 on 03-20-16 was 713, as baseline for doxil (began 04-10-16) Cytology ER negative 08-2015   She enjoyed daughter's wedding in [place] late July.  ONCOLOGIC HISTORY Patient had regular medical care by PCP  Dr Alain Marion and gyn Dr Garwin Brothers. In spring 2016, she noticed low pelvic discomfort and some intermittent vaginal spotting. She was seen for regular visit by Dr Garwin Brothers in May 2016, with PAP positive for AGUS and US showing endometrial stripe 8.31m. Endometrial biopsy 03-12-15 showed high grade adenocarcinoma with papillary features, however immunostains were not typical for serous carcinoma. She was seen by Dr RDenman Georgein June. CT CAP 04-14-15 showed no evidence of metastatic disease in chest, a 3.3 x 2.8 cm endometrial mas and 3.1 cm cystic lesion of right ovary. She had robotic total laparoscopic hysterectomy BSO with bilateral pelvic and para aortic node [facility] Pathology ([phone] grade 2 endometrioid adenocarcinoma invading 0.35 cm where myometrium 1.1 cm, 26 nodes negative and no LVSI. She had intracavitary radiation due to risk for vaginal cuff recurrence from 06-01-15 thru 07-01-15, vaginal cuff 27.5 gray in 5 fractions. There were no obvious concerns when she was seen by Dr RDenman Georgein late July or Dr KSondra Comeand Dr PAlain Marionin October. She presented to ED on 09-06-15 with generalized abdominal pain, bloating and constipation x 1 month. CT AP 09-06-15 showed extensive nodularity and stranding thruout omentum and large volume ascites, with liver/spleen/pancreas/adrenals not remarkable, no bowel obstruction. CA 125 was 2427. She had UKoreaparacentesi on 09-07-15 for 5 liters of serous fluid, cytology (([id number] adenocarcinoma. She had consultation with Dr RDenman Georgeon 09-13-15, recommendation for systemic chemotherapy. Review of cytology from ascites compared with surgical specimen and [facility] high grade features that were seen on  the initial endometrial biopsy (not seen on the full surgical path); ER negative. She had repeat US paracentesis for 5.2 liters on 09-20-15. She had first carboplatin taxol on 09-21-15 and  was neutropenic by day 8 cycle 1, granix begun. She had cycle 2 at same doses (carbo AUC = 3.5) 0n 10-13-15 with neulasta, again neutropenic such that doses were adjusted subsequently. CT AP 01-12-16 after cycle 6 showed improvement, tho still evidence of disease; CA 125 was 683 with cycle 6 chemo. She received additional 3 cycles thru cycle 9 on 03-07-16, with progressive peripheral neuropathy despite decrease in taxol dose, and increase in CA 125 to 713 on 03-20-16. CT AP 03-31-16 confirmed progression of omental and peritoneal involvement and some increase in ascites. She began doxil on 04-10-16.    Objective:  Vital signs in last 24 hours:  BP (!) 160/68 (BP Location: Left Arm, Patient Position: Sitting)   Pulse 82   Temp 98.4 F (36.9 C) (Oral)   Resp 18   Ht _0  (1.676 m)   Wt 161 lb 4.8 oz (73.2 kg)   SpO2 100%   BMI 26.03 kg/m  Weight down 4 lbs. Alert, oriented and appropriate. Ambulatory without difficulty, looks comfortable seated in exam room, respirations not labored RA. Wears wig  HEENT:PERRL, sclerae not icteric. Oral mucosa moist without ulcerations or other lesions, posterior pharynx clear.  Neck supple. No JVD.  Lymphatics:no cervical,supraclavicular or inguinal adenopathy Resp: clear to auscultation bilaterally  Cardio: regular rate and rhythm. No gallop. GI: soft, nontender, still somewhat distended, cannot appreciate mass or organomegaly. Normally active bowel sounds. Surgical incisions not remarkable. Musculoskeletal/ Extremities: LE without pitting edema, cords, tenderness Neuro: no increase in peripheral neuropathy related to taxane. Otherwise nonfocal. PSYCH appropriate mood and affect Skin not obviously dry, no rash or irritation, palms not remarkable, no ecchymosis or petechiae Portacath-without erythema or tenderness  Lab Results:  Results for orders placed or performed in visit on 05/25/16  CBC with Differential  Result Value Ref Range   WBC 3.1 (L) 3.9 -  10.3 10e3/uL   NEUT# 1.9 1.5 - 6.5 10e3/uL   HGB 10.6 (L) 11.6 - 15.9 g/dL   HCT 31.9 (L) 34.8 - 46.6 %   Platelets 176 145 - 400 10e3/uL   MCV 95.2 79.5 - 101.0 fL   MCH 31.6 25.1 - 34.0 pg   MCHC 33.2 31.5 - 36.0 g/dL   RBC 3.35 (L) 3.70 - 5.45 10e6/uL   RDW 13.4 11.2 - 14.5 %   lymph# 0.7 (L) 0.9 - 3.3 10e3/uL   MONO# 0.4 0.1 - 0.9 10e3/uL   Eosinophils Absolute 0.1 0.0 - 0.5 10e3/uL   Basophils Absolute 0.0 0.0 - 0.1 10e3/uL   NEUT% 63.2 38.4 - 76.8 %   LYMPH% 22.5 14.0 - 49.7 %   MONO% 12.4 0.0 - 14.0 %   EOS% 1.6 0.0 - 7.0 %   BASO% 0.3 0.0 - 2.0 %  Comprehensive metabolic panel  Result Value Ref Range   Sodium 139 136 - 145 mEq/L   Potassium 3.8 3.5 - 5.1 mEq/L   Chloride 104 98 - 109 mEq/L   CO2 26 22 - 29 mEq/L   Glucose 114 70 - 140 mg/dl   BUN 12.0 7.0 - 26.0 mg/dL   Creatinine 0.9 0.6 - 1.1 mg/dL   Total Bilirubin 0.38 0.20 - 1.20 mg/dL   Alkaline Phosphatase 47 40 - 150 U/L   AST 26 5 - 34 U/L   ALT 10  0 - 55 U/L   Total Protein 8.0 6.4 - 8.3 g/dL   Albumin 3.5 3.5 - 5.0 g/dL   Calcium 9.8 8.4 - 10.4 mg/dL   Anion Gap 9 3 - 11 mEq/L   EGFR 76 (L) >90 ml/min/1.73 m2  CA 125  Result Value Ref Range   Cancer Antigen (CA) 125 1,320.0 (H) 0.0 - 38.1 U/mL    CA 125 compares with 713 obtained 3 weeks prior to first doxil; note marker can take longer to show response with doxil that other agents.  Studies/Results:  No results found.  Medications: I have reviewed the patient's current medications.  DISCUSSION Patient and husband are pleased with how well she is feeling on present doxil. Discussed concerns about adequacy of po nutrition, suggested adding a second protein shake at midday since she likes this, and trying to eat small amounts at intervals additionally; will ask [facility] nutritionist to follow up.  Reminded patient that doxil skin effects are cumulative, encouraged her to continue being diligent with lotion and avoiding friction to skin.   Prior to  result of CA 125, I have told them that the marker can be slower to improve on doxil than other chemo  Patient is in agreement with continuing treatment with cycle 3 doxil on 8-21, as long as [place] >=1.5 and plt >=100k.  Chemo orders addended to add oral ice and ice packs to hands and feet during doxil infusion beginning cycle 3.   Assessment/Plan:  1. 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 Carbo taxol x 9 cycles from 09-21-15 thru 03-07-16,required dose adjustment and neulasta support CT AP 03-31-16 progression. Treatment changed to doxil, cycle 1 04-10-16. Clinically stable/ energy better, will follow marker with next cycle. Keep apt with Dr Skeet Latch 8-31 as planned. Will need echocardiogram prior to cycle 4 if continues. Add ice oral and to hands/ feet with cycle 3.  2. Chemo neutropenia has required dose reduction in carboplatin and neulasta. Previous pelvic RT likely impacted counts with prior chemo. Doxil easier on counts to date.  3.PAC in by IR 4.chemo anemia, anemia of CKD and iron deficiency anemia: Continue oral iron and follow, fairly stable now 5.renal insufficiency with ascites initially, improved 6..History of "hepatitis" reportedly on steroids x 20 years which were being tapered when I met her initially, present dose 2.5 mg QOD per PCP 7.history of HTN and elevated lipids: now off HCTZ. Managed by Dr Alain Marion 8.post cholecystectomy and up to date colonoscopy and mammograms 9..squamous cell carcinoma anus 2003: radiation and (?) chemo then, may be impacting counts with present chemo. 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 Slight improvement since off of taxol. Follow. 11.has been given information for advance directives, still not completed per EMR 12.po intake not optimal, discussed and will ask dietician to follow  up  All questions answered and she knows to call if needed prior to next scheduled appointment. Chemo orders addended as above. Orders entered for echo and dietician. Time spent 25 min including >50% counseling and coordination of care.     Evlyn Clines, MD   05/26/2016, 9:37 AM

## 2016-05-26 DIAGNOSIS — Z79899 Other long term (current) drug therapy: Secondary | ICD-10-CM | POA: Insufficient documentation

## 2016-05-26 LAB — CA 125

## 2016-05-30 ENCOUNTER — Ambulatory Visit: Payer: Commercial Managed Care - HMO | Admitting: Internal Medicine

## 2016-06-01 ENCOUNTER — Encounter: Payer: Self-pay | Admitting: Internal Medicine

## 2016-06-05 ENCOUNTER — Ambulatory Visit: Payer: Commercial Managed Care - HMO

## 2016-06-05 ENCOUNTER — Ambulatory Visit (HOSPITAL_BASED_OUTPATIENT_CLINIC_OR_DEPARTMENT_OTHER): Payer: Commercial Managed Care - HMO

## 2016-06-05 ENCOUNTER — Other Ambulatory Visit (HOSPITAL_BASED_OUTPATIENT_CLINIC_OR_DEPARTMENT_OTHER): Payer: Commercial Managed Care - HMO

## 2016-06-05 VITALS — BP 155/87 | HR 89 | Temp 97.9°F | Resp 18

## 2016-06-05 DIAGNOSIS — C801 Malignant (primary) neoplasm, unspecified: Secondary | ICD-10-CM

## 2016-06-05 DIAGNOSIS — Z5111 Encounter for antineoplastic chemotherapy: Secondary | ICD-10-CM | POA: Diagnosis not present

## 2016-06-05 DIAGNOSIS — C541 Malignant neoplasm of endometrium: Secondary | ICD-10-CM

## 2016-06-05 DIAGNOSIS — Z95828 Presence of other vascular implants and grafts: Secondary | ICD-10-CM

## 2016-06-05 DIAGNOSIS — C786 Secondary malignant neoplasm of retroperitoneum and peritoneum: Secondary | ICD-10-CM

## 2016-06-05 LAB — CBC WITH DIFFERENTIAL/PLATELET
BASO%: 0.3 % (ref 0.0–2.0)
BASOS ABS: 0 10*3/uL (ref 0.0–0.1)
EOS ABS: 0 10*3/uL (ref 0.0–0.5)
EOS%: 0.4 % (ref 0.0–7.0)
HEMATOCRIT: 32.2 % — AB (ref 34.8–46.6)
HGB: 10.7 g/dL — ABNORMAL LOW (ref 11.6–15.9)
LYMPH%: 13 % — ABNORMAL LOW (ref 14.0–49.7)
MCH: 30.8 pg (ref 25.1–34.0)
MCHC: 33.1 g/dL (ref 31.5–36.0)
MCV: 92.8 fL (ref 79.5–101.0)
MONO#: 0.9 10*3/uL (ref 0.1–0.9)
MONO%: 16.5 % — AB (ref 0.0–14.0)
NEUT#: 3.6 10*3/uL (ref 1.5–6.5)
NEUT%: 69.8 % (ref 38.4–76.8)
Platelets: 238 10*3/uL (ref 145–400)
RBC: 3.47 10*6/uL — ABNORMAL LOW (ref 3.70–5.45)
RDW: 13.6 % (ref 11.2–14.5)
WBC: 5.2 10*3/uL (ref 3.9–10.3)
lymph#: 0.7 10*3/uL — ABNORMAL LOW (ref 0.9–3.3)

## 2016-06-05 LAB — COMPREHENSIVE METABOLIC PANEL
ALT: 12 U/L (ref 0–55)
AST: 27 U/L (ref 5–34)
Albumin: 3.2 g/dL — ABNORMAL LOW (ref 3.5–5.0)
Alkaline Phosphatase: 58 U/L (ref 40–150)
Anion Gap: 7 mEq/L (ref 3–11)
BUN: 8.6 mg/dL (ref 7.0–26.0)
CALCIUM: 9.8 mg/dL (ref 8.4–10.4)
CHLORIDE: 104 meq/L (ref 98–109)
CO2: 27 mEq/L (ref 22–29)
Creatinine: 0.9 mg/dL (ref 0.6–1.1)
EGFR: 76 mL/min/{1.73_m2} — AB (ref >90)
GLUCOSE: 143 mg/dL — AB (ref 70–140)
POTASSIUM: 4.2 meq/L (ref 3.5–5.1)
SODIUM: 138 meq/L (ref 136–145)
Total Bilirubin: 0.3 mg/dL (ref 0.20–1.20)
Total Protein: 7.9 g/dL (ref 6.4–8.3)

## 2016-06-05 MED ORDER — DOXORUBICIN HCL LIPOSOMAL CHEMO INJECTION 2 MG/ML
37.0000 mg/m2 | Freq: Once | INTRAVENOUS | Status: AC
  Administered 2016-06-05: 70 mg via INTRAVENOUS
  Filled 2016-06-05: qty 25

## 2016-06-05 MED ORDER — DEXAMETHASONE SODIUM PHOSPHATE 100 MG/10ML IJ SOLN
Freq: Once | INTRAMUSCULAR | Status: AC
  Administered 2016-06-05: 12:00:00 via INTRAVENOUS
  Filled 2016-06-05: qty 4

## 2016-06-05 MED ORDER — SODIUM CHLORIDE 0.9% FLUSH
10.0000 mL | INTRAVENOUS | Status: DC | PRN
  Administered 2016-06-05: 10 mL
  Filled 2016-06-05: qty 10

## 2016-06-05 MED ORDER — HEPARIN SOD (PORK) LOCK FLUSH 100 UNIT/ML IV SOLN
500.0000 [IU] | Freq: Once | INTRAVENOUS | Status: AC | PRN
  Administered 2016-06-05: 500 [IU]
  Filled 2016-06-05: qty 5

## 2016-06-05 MED ORDER — DEXTROSE 5 % IV SOLN
INTRAVENOUS | Status: DC
  Administered 2016-06-05: 12:00:00 via INTRAVENOUS

## 2016-06-05 MED ORDER — SODIUM CHLORIDE 0.9 % IJ SOLN
10.0000 mL | INTRAMUSCULAR | Status: DC | PRN
  Administered 2016-06-05: 10 mL via INTRAVENOUS
  Filled 2016-06-05: qty 10

## 2016-06-05 NOTE — Patient Instructions (Signed)

## 2016-06-05 NOTE — Patient Instructions (Signed)
[facility] Discharge Instructions for Patients Receiving Chemotherapy  Today you received the following chemotherapy agents:  Liposomal Doxorubicin (Doxil)  To help prevent nausea and vomiting after your treatment, we encourage you to take your nausea medication as prescribed.   If you develop nausea and vomiting that is not controlled by your nausea medication, call the clinic.   BELOW ARE SYMPTOMS THAT SHOULD BE REPORTED IMMEDIATELY:  *FEVER GREATER THAN 100.5 F  *CHILLS WITH OR WITHOUT FEVER  NAUSEA AND VOMITING THAT IS NOT CONTROLLED WITH YOUR NAUSEA MEDICATION  *UNUSUAL SHORTNESS OF BREATH  *UNUSUAL BRUISING OR BLEEDING  TENDERNESS IN MOUTH AND THROAT WITH OR WITHOUT PRESENCE OF ULCERS  *URINARY PROBLEMS  *BOWEL PROBLEMS  UNUSUAL RASH Items with * indicate a potential emergency and should be followed up as soon as possible.  Feel free to call the clinic you have any questions or concerns. The clinic phone number is [phone].  Please show the [facility] at check-in to the Emergency Department and triage nurse.

## 2016-06-12 NOTE — Progress Notes (Signed)
Follow Up Note: Gyn-Onc  Sharon Holt 75 y.o. female  CC: Endometrial cancer progression Treatment counseling.  Assessment/Plan:  75 y.o.  with progression of Stage IA Grade 2 endometrial adenocarcinoma. She has received 9 cycles taxol/carbo with rise in CA 125 Chemotherapy changed to doxil with continued rise in CA 125. Discussed change in chemotherapy to temsilorimus +-metformin Follow-up in 2 months We will check ER PR receptor status for consideration of other oral agents in the future  HPI:  Sharon Holt is a 75 y.o. 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  She had a 2 month history of vaginal spotting and saw Dr. Garwin Brothers for her scheduled annual visit in May 2016. A Pap was performed which was positive for AGUS. She then underwent ultrasound evaluation, which revealed a thickened endometrial stripe at 8.8 mm with the uterus itself measured 8 x 5 x 6 cm. An endometrial biopsy was performed on 03/12/2015 which revealed high-grade adenocarcinoma with papillary features architecturally however the immunostains were not typical for serous adenocarcinoma.  On 04/20/2015, she underwent a Robotic total laparoscopic hysterectomy, bilateral salpingo oophorectomy, bilateral pelvic lymph node dissection, bilateral para-aortic lymph node dissection    Final pathology revealed:   1. Lymph nodes, regional resection, right periaortic - SIX BENIGN LYMPH NODES (0/6). 2. Lymph nodes, regional resection, left periaortic - FIVE BENIGN LYMPH NODE (0/5). 3. Lymph nodes, regional resection, right pelvic - SEVEN BENIGN LYMPH NODES (0/7) 4. Lymph nodes, regional resection, left pelvic - EIGHT BENIGN LYMPH NODES (0/8) 5. Uterus and cervix, bilateral tubes and ovaries - ENDOMETRIAL ADENOCARCINOMA INVADING THE SUPERFICIAL MYOMETRIUM. - MARGINS NOT INVOLVED. - RIGHT OVARY: MATURE CYSTIC TERATOMA. - CERVIX, RIGHT AND LEFT OVARIES  AND RIGHT AND LEFT FALLOPIAN TUBES FREE OF TUMOR.  We requested that her initial pathology from the biopsy be compared to the postop biopsy due to the descrepancy (grade 2 endometrioid tumor on hysterectomy, high-grade adenocarcinoma with architectural papillary features but immunohistochemicalstaining not typical for serous carcinoma on biopsy ). There was concordance between the reads. Because the larger volume of tumor (the hysterectomy specimen) showed no serous features, we treated this is a grade 2 endometrioid lesion, not serous.  She went on to receive vaginal brachytherapy for high/intermediate risk factors in the uterine pathology.  She was admitted to [facility] on 09/07/15 with abdominal distension and discomfort. CT abdomen and pelvis on 09/07/15 showed large volume ascites and an omental cake of tumor. There was no apparent chest disease. She underwent paracentesis on 09/08/15 and the fluid was sent for cytology which confirmed metastatic adenocarcinoma.  Received cycle 9 taxol/carboplatin 03/07/2016. CA 125 rising since 01/2016 ca125 03/20/2016  713. Chemotherapy changed to single agent doxil.  Received cycle 3 on 05/25/2016 with continued rise in CA 125 now 1320.    Review of Systems  Constitutional: Reports poor appetite  Cardiovascular: No chest pain, shortness of breath, or edema.  Pulmonary: No cough or wheeze.  Gastrointestinal: No nausea, vomiting, or diarrhea. No bright red blood per rectum or change in bowel movement.  Genitourinary: No frequency, urgency, or dysuria. No vaginal bleeding or discharge.  Musculoskeletal: No myalgia or joint pain. Neurologic: No weakness, numbness, or change in gait.  Psychology: No depression, anxiety, or insomnia.  Social Hx:   Social History   Social History  . Marital status: Married    Spouse name: Danyla Sieling  . Number of children: 3  . Years of education: N/A  Occupational History  . Retired     Company secretary   Social  History Main Topics  . Smoking status: Never Smoker  . Smokeless tobacco: Never Used  . Alcohol use No  . Drug use: No  . Sexual activity: Not on file   Other Topics Concern  . Not on file   Social History Narrative  . No narrative on file    Past Surgical Hx:  Past Surgical History:  Procedure Laterality Date  . BREAST LUMPECTOMY     right breast BENIGN  . CHOLECYSTECTOMY    . ROBOTIC ASSISTED TOTAL HYSTERECTOMY WITH BILATERAL SALPINGO OOPHERECTOMY Bilateral 04/20/2015   Procedure: ROBOTIC ASSISTED TOTAL HYSTERECTOMY WITH BILATERAL SALPINGO OOPHORECTOMY AND PELVIC AND PERIAORTIC LYMPHADECTOMY;  Surgeon: Janie Morning, MD;  Location: [facility];  Service: Gynecology;  Laterality: Bilateral;  . TONSILLECTOMY      Past Medical Hx:  Past Medical History:  Diagnosis Date  . Anemia, unspecified   . Anxiety state, unspecified   . 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 Endometrial cancer ([place]) 04/2015   serous adenocarcinoma  . History of brachytherapy 06/01/15, 06/10/15, 06/17/15, 06/24/15, 07/01/15   vaginal cuff 27.5 gray  . 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 Mitral valve disorders   . Osteoporosis   . Other abnormal glucose   . Other and unspecified hyperlipidemia   . Transfusion history    "back in my 20's"  . Unspecified essential hypertension     Family Hx:  Family History  Problem Relation Age of Onset  . Prostate cancer Brother     dx. 65s  . Stroke Father   . Breast cancer Sister     dx. 64-65  . Dementia Brother   . Breast cancer Sister 63    mastectomy  . Leukemia Sister     dx. 88s-70s    Vitals:  Blood pressure (!) 146/83, pulse 98, temperature 98.5 F (36.9 C), temperature source Oral, resp. rate 18, weight 157 lb (71.2 kg), SpO2 100 %.  Physical Exam:  General: Well developed, well nourished female in no acute distress. Alert and oriented x 3.  Cardiovascular: Regular rate and rhythm.   Lungs: Clear to auscultation bilaterally. No wheezes/crackles/rhonchi noted.  Skin: No rashes or lesions present. Back: No CVA tenderness.  Gyn: Nl EGBUS, no pelvic masses or nodularity Rectal:  Good tone, no masses,  Abdomen: Abdomen soft, non-tender , obese. Active bowel sounds in all quadrants. Incisions clean, dry and in tact. Extremities: No bilateral cyanosis, edema, or clubbing.

## 2016-06-15 ENCOUNTER — Encounter: Payer: Self-pay | Admitting: Gynecologic Oncology

## 2016-06-15 ENCOUNTER — Ambulatory Visit: Payer: Commercial Managed Care - HMO | Attending: Gynecologic Oncology | Admitting: Gynecologic Oncology

## 2016-06-15 VITALS — BP 146/83 | HR 98 | Temp 98.5°F | Resp 18 | Wt 157.0 lb

## 2016-06-15 DIAGNOSIS — Z7189 Other specified counseling: Secondary | ICD-10-CM | POA: Diagnosis not present

## 2016-06-15 DIAGNOSIS — R971 Elevated cancer antigen 125 [CA 125]: Secondary | ICD-10-CM

## 2016-06-15 DIAGNOSIS — Z9049 Acquired absence of other specified parts of digestive tract: Secondary | ICD-10-CM | POA: Diagnosis not present

## 2016-06-15 DIAGNOSIS — Z9889 Other specified postprocedural states: Secondary | ICD-10-CM | POA: Diagnosis not present

## 2016-06-15 DIAGNOSIS — C541 Malignant neoplasm of endometrium: Secondary | ICD-10-CM | POA: Insufficient documentation

## 2016-06-15 DIAGNOSIS — Z9221 Personal history of antineoplastic chemotherapy: Secondary | ICD-10-CM

## 2016-06-15 NOTE — Progress Notes (Signed)
ER/PR ordered per Dr. Skeet Latch on Accession# [id number].  Spoke with Maudie Mercury in [place].

## 2016-06-15 NOTE — Patient Instructions (Signed)
Plan to follow up with Dr. Marko Plume.  Follow up with Dr. Skeet Latch in two months or sooner if needed.    Temsirolimus injection What is this medicine? TEMSIROLIMUS (TEM sir OH li mus) is a drug that alters immune system response in the body. It is used to treat renal cell cancer. This medicine may be used for other purposes; ask your health care provider or pharmacist if you have questions. What should I tell my health care provider before I take this medicine? They need to know if you have any of these conditions: -diabetes -heart disease -high cholesterol -immune system problems -infection (especially a virus infection such as chickenpox, cold sores, or herpes) -liver disease -low blood counts, like low white cell, platelet, or red cell counts -lung or breathing disease, like asthma -take medicines that treat or prevent blood clots -an unusual or allergic reaction to temsirolimus, polysorbate 80, other medicines, foods, dyes, or preservatives -pregnant or trying to get pregnant -breast-feeding How should I use this medicine? This medicine is for infusion into a vein. It is given by a health care professional in a hospital or clinic setting. Talk to your pediatrician regarding the use of this medicine in children. Special care may be needed. Overdosage: If you think you have taken too much of this medicine contact a poison control center or emergency room at once. NOTE: This medicine is only for you. Do not share this medicine with others. What if I miss a dose? It is important not to miss your dose. Call your doctor or health care professional if you are unable to keep an appointment. What may interact with this medicine? Do not take this medicine with any of the following medications: -grapefruit juice -St. John's Wort This medicine may also interact with the following medications: -carbamazepine -dexamethasone -nefazodone -phenobarbital -phenytoin -medicines for heart or kidney  problems like captopril, lisinopril -medicines for infection like clarithromycin, itraconazole, ketoconazole, rifabutin, rifampin, telithromycin -some medicines for HIV like atazanavir, indinavir, nelfinavir, ritonavir, saquinavir -sunitinib -vaccines This list may not describe all possible interactions. Give your health care provider a list of all the medicines, herbs, non-prescription drugs, or dietary supplements you use. Also tell them if you smoke, drink alcohol, or use illegal drugs. Some items may interact with your medicine. What should I watch for while using this medicine? Visit your doctor for regular check-ups. you will need important blood work done while you are taking this medicine. This drug may make you feel generally unwell. This is not uncommon, as chemotherapy can affect healthy cells as well as cancer cells. Report any side effects. Continue your course of treatment even though you feel ill unless your doctor tells you to stop. Call your doctor or health care professional for advice if you get a fever, chills or sore throat, or other symptoms of a cold or flu. Do not treat yourself. This drug decreases your body's ability to fight infections. Try to avoid being around people who are sick. This medicine may increase your risk to bruise or bleed. Call your doctor or health care professional if you notice any unusual bleeding. Be careful brushing and flossing your teeth or using a toothpick because you may get an infection or bleed more easily. If you have any dental work done, tell your dentist you are receiving this medicine. Avoid taking products that contain aspirin, acetaminophen, ibuprofen, naproxen, or ketoprofen unless instructed by your doctor. These medicines may hide a fever. Do not become pregnant while taking this medicine.  Women should inform their doctor if they wish to become pregnant or think they might be pregnant. There is a potential for serious side effects to an  unborn child. Women who are able to have children should use effective birth control before, during, and for 12 weeks after stopping this medicine. Talk to your health care professional or pharmacist for more information. Do not breast-feed an infant while taking this medicine. What side effects may I notice from receiving this medicine? Side effects that you should report to your doctor or health care professional as soon as possible: -allergic reactions like skin rash, itching or hives, swelling of the face, lips, or tongue -breathing problems, cough -chest pain -dizziness -fever or chills, sore throat -hallucination, loss of contact with reality -increased hunger or thirst -increased urination -pain, swelling, warmth in the leg -redness, blistering, peeling or loosening of the skin, including inside the mouth -seizures -swelling of the legs or ankles -trouble passing urine or change in the amount of urine -unusual bleeding or bruising -unusually weak or tired Side effects that usually do not require medical attention (report to your prescriber or health care professional if they continue or are bothersome): -constipation -loss of appetite -mouth sores -nausea, vomiting -stomach pain This list may not describe all possible side effects. Call your doctor for medical advice about side effects. You may report side effects to FDA at 1-800-FDA-1088. Where should I keep my medicine? This drug is given in a hospital or clinic and will not be stored at home. NOTE: This sheet is a summary. It may not cover all possible information. If you have questions about this medicine, talk to your doctor, pharmacist, or health care provider.    2016, [employer]. (2008-06-29 16:31:28)

## 2016-06-20 ENCOUNTER — Telehealth: Payer: Self-pay

## 2016-06-20 NOTE — Telephone Encounter (Signed)
S/w pt per Dr Marko Plume pt needs to make appt with [facility], she stated she would call them in the morning. She had eaten noodles just before the call with no problem. She denied any thrush, white patches, red beefy tongue or soreness in her throat. Did instruction on small bites, chew well, soft moist foods and sitting up straight when eating.  Did let pt know Dr Marko Plume had suggestions of diflucan or carafate if needed and pt said not at this time. She will call if any needs arise.

## 2016-06-20 NOTE — Telephone Encounter (Signed)
Pt called stating that sometimes when she swallows she feels like her throat is closing or foot will stop in her chest.. She is not choking, there is no fever, no nausea/vomiting. It will pass after a little time. Mostly felt with harder foods, occasionally when drinking. This has been noticed in the last few days.

## 2016-06-23 ENCOUNTER — Telehealth: Payer: Self-pay | Admitting: Gastroenterology

## 2016-06-23 ENCOUNTER — Encounter: Payer: Self-pay | Admitting: Internal Medicine

## 2016-06-23 ENCOUNTER — Ambulatory Visit (INDEPENDENT_AMBULATORY_CARE_PROVIDER_SITE_OTHER): Payer: Commercial Managed Care - HMO | Admitting: Internal Medicine

## 2016-06-23 VITALS — BP 140/82 | HR 86 | Temp 98.8°F | Wt 156.0 lb

## 2016-06-23 DIAGNOSIS — R1310 Dysphagia, unspecified: Secondary | ICD-10-CM | POA: Insufficient documentation

## 2016-06-23 DIAGNOSIS — Z23 Encounter for immunization: Secondary | ICD-10-CM

## 2016-06-23 MED ORDER — RABEPRAZOLE SODIUM 20 MG PO TBEC: 20.0000 mg | DELAYED_RELEASE_TABLET | Freq: Two times a day (BID) | ORAL | 11 refills | Status: DC

## 2016-06-23 NOTE — Telephone Encounter (Signed)
Please schedule for dysphagia with next available APP

## 2016-06-23 NOTE — Assessment & Plan Note (Signed)
x 2 weeks - new - esoph stricture vs other

## 2016-06-23 NOTE — Progress Notes (Signed)
Subjective:  Patient ID: Sharon Holt, female    DOB: 05-05-1941  Age: 75 y.o. MRN: [id number]  CC: Dysphagia (intermittnent, worse with eating and swallowing her meds x 1 week)   HPI Sharon Holt presents for chocking on solid food x 2 weeks. Took Protonix x2d - did not help. Last chemo 4 weeks ago  Outpatient Medications Prior to Visit  Medication Sig Dispense Refill  . atenolol (TENORMIN) 25 MG tablet Take 3 tablets (75 mg total) by mouth daily. 270 tablet 3  . Cholecalciferol (VITAMIN D3) 2000 UNITS capsule Take 2,000 Units by mouth daily.     . clorazepate (TRANXENE) 7.5 MG tablet Take 1 tablet (7.5 mg total) by mouth 2 (two) times daily as needed for anxiety. 180 tablet 1  . feeding supplement, ENSURE ENLIVE, (ENSURE ENLIVE) LIQD Take 237 mLs by mouth 2 (two) times daily between meals. 237 mL 12  . FERROCITE 324 MG TABS tablet Take 1 tablet (106 mg of iron total) by mouth daily. 30 tablet 4  . lidocaine-prilocaine (EMLA) cream Apply 1-2 hours prior to [facility] Access as needed. 30 g 1  . LORazepam (ATIVAN) 0.5 MG tablet Place 1 tablet under the tongue or swallow every 6 hours as needed for nausea. 30 tablet 0  . Multiple Vitamins-Minerals (CENTRUM SILVER PO) Take 1 capsule by mouth daily.     . ondansetron (ZOFRAN) 8 MG tablet Take 0.5-1 tablets (4-8 mg total) by mouth every 8 (eight) hours as needed for nausea, vomiting or refractory nausea / vomiting. Will NOT make drowsy. 30 tablet 2  . pantoprazole (PROTONIX) 40 MG tablet Take 1 tablet (40 mg total) by mouth daily. For acid reflux 30 tablet 2  . polyethylene glycol (MIRALAX / GLYCOLAX) packet Take 17 g by mouth daily. Reported on 12/09/2015    . potassium chloride SA (K-DUR,KLOR-CON) 20 MEQ tablet Take 1 tablet (20 mEq total) by mouth daily. 90 tablet 3  . vitamin C (ASCORBIC ACID) 500 MG tablet Take 500 mg by mouth daily.    . predniSONE (DELTASONE) 5 MG tablet Take 0.5 tablets (2.5 mg total) by mouth daily. (Patient  taking differently: Take 2.5 mg by mouth every other day. ) 45 tablet 5   No facility-administered medications prior to visit.     ROS Review of Systems  Constitutional: Negative for activity change, appetite change, chills, fatigue and unexpected weight change.  HENT: Negative for congestion, mouth sores and sinus pressure.   Eyes: Negative for visual disturbance.  Respiratory: Negative for cough, chest tightness and shortness of breath.   Gastrointestinal: Negative for abdominal pain and nausea.  Genitourinary: Negative for difficulty urinating, frequency and vaginal pain.  Musculoskeletal: Negative for back pain and gait problem.  Skin: Negative for pallor and rash.  Neurological: Negative for dizziness, tremors, weakness, numbness and headaches.  Psychiatric/Behavioral: Negative for confusion and sleep disturbance.    Objective:  BP 140/82   Pulse 86   Temp 98.8 F (37.1 C) (Oral)   Wt 156 lb (70.8 kg)   SpO2 97%   BMI 25.18 kg/m   BP Readings from Last 3 Encounters:  06/23/16 140/82  06/15/16 (!) 146/83  06/05/16 (!) 155/87    Wt Readings from Last 3 Encounters:  06/23/16 156 lb (70.8 kg)  06/15/16 157 lb (71.2 kg)  05/25/16 161 lb 4.8 oz (73.2 kg)    Physical Exam  Constitutional: She appears well-developed. No distress.  HENT:  Head: Normocephalic.  Right Ear: External ear normal.  Left Ear: External ear normal.  Nose: Nose normal.  Mouth/Throat: Oropharynx is clear and moist.  Eyes: Conjunctivae are normal. Pupils are equal, round, and reactive to light. Right eye exhibits no discharge. Left eye exhibits no discharge.  Neck: Normal range of motion. Neck supple. No JVD present. No tracheal deviation present. No thyromegaly present.  Cardiovascular: Normal rate, regular rhythm and normal heart sounds.   Pulmonary/Chest: No stridor. No respiratory distress. She has no wheezes.  Abdominal: Soft. Bowel sounds are normal. She exhibits no distension and no mass.  There is no tenderness. There is no rebound and no guarding.  Musculoskeletal: She exhibits no edema or tenderness.  Lymphadenopathy:    She has no cervical adenopathy.  Neurological: She displays normal reflexes. No cranial nerve deficit. She exhibits normal muscle tone. Coordination normal.  Skin: No rash noted. No erythema.  Psychiatric: She has a normal mood and affect. Her behavior is normal. Judgment and thought content normal.    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 EXAM: IMPLANTED PORT A CATH PLACEMENT WITH ULTRASOUND AND FLUOROSCOPIC GUIDANCE ANESTHESIA/SEDATION: 1.0 Mg IV Versed; 50 mcg IV Fentanyl Total Moderate Sedation Time:  30 minutes Additional Medications: 2 g IV Ancef. As antibiotic prophylaxis, Ancef was ordered pre-procedure and administered intravenously within one hour of incision. FLUOROSCOPY TIME:  54 seconds. PROCEDURE: The procedure, risks, benefits, and alternatives were explained to the patient. Questions regarding the procedure were encouraged and answered. The patient understands and consents to the procedure. A time-out was performed prior to the procedure. The right neck and chest were prepped with chlorhexidine in a sterile fashion, and a sterile drape was applied covering the operative field. Maximum barrier sterile  technique with sterile gowns and gloves were used for the procedure. Local anesthesia was provided with 1% lidocaine. Ultrasound was used to confirm patency of the right internal jugular vein. After creating a small venotomy incision, a 21 gauge needle was advanced into the right internal jugular vein under direct, real-time ultrasound guidance. Ultrasound image documentation was performed. After securing guidewire access, an 8 Fr dilator was placed. A J-wire was kinked to measure appropriate catheter length. A subcutaneous port pocket was then created along the upper chest wall utilizing sharp and blunt dissection. Portable cautery was utilized. The pocket was irrigated with sterile saline. A single lumen power injectable port was chosen for placement. The 8 Fr catheter was tunneled from the port pocket site to the venotomy incision. The port was placed in the pocket. External catheter was trimmed to appropriate length based on guidewire measurement. At the venotomy, an 8 Fr peel-away sheath was placed over a guidewire. The catheter was then placed through the sheath and the sheath removed. Final  catheter positioning was confirmed and documented with a fluoroscopic spot image. The port was accessed with a needle and aspirated and flushed with heparinized saline. The needle was removed. The venotomy and port pocket incisions were closed with subcutaneous 3-0 Monocryl and subcuticular 4-0 Vicryl. Dermabond was applied to both incisions. COMPLICATIONS: None FINDINGS: After catheter placement, the tip lies at the cavoatrial junction. The catheter aspirates normally and is ready for immediate use. IMPRESSION: Placement of single lumen port a cath via right internal jugular vein. The catheter tip lies at the cavoatrial junction. A power injectable port a cath was placed and is ready for immediate use. Electronically Signed   By: Aletta Edouard M.D.   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 EXAM: IMPLANTED PORT A CATH PLACEMENT WITH ULTRASOUND AND FLUOROSCOPIC GUIDANCE ANESTHESIA/SEDATION: 1.0 Mg IV Versed; 50 mcg IV Fentanyl Total Moderate Sedation Time:  30 minutes Additional Medications: 2 g IV Ancef. As antibiotic prophylaxis, Ancef was ordered pre-procedure and administered intravenously within one hour of incision. FLUOROSCOPY TIME:  54 seconds. PROCEDURE: The procedure, risks, benefits, and alternatives were explained to the patient. Questions regarding the procedure were encouraged and answered. The patient understands and consents to the procedure. A time-out was performed prior to the procedure. The right neck and chest were prepped with chlorhexidine in a sterile fashion, and a sterile drape was applied covering the operative field. Maximum barrier sterile technique with sterile gowns and gloves were used for the procedure. Local anesthesia was provided with 1% lidocaine. Ultrasound was used to confirm patency of the right internal jugular vein. After creating a small venotomy incision, a 21 gauge needle was advanced into the right internal jugular vein under direct, real-time ultrasound guidance. Ultrasound image documentation was performed. After securing guidewire access, an 8 Fr dilator was placed. A J-wire was kinked to measure appropriate catheter length. A subcutaneous port pocket was then created along the upper chest wall utilizing sharp and blunt dissection. Portable cautery was utilized. The pocket was irrigated with sterile saline. A single lumen power injectable port was chosen for placement. The 8 Fr catheter was tunneled from the port pocket site to the venotomy incision. The port was placed in the pocket. External catheter was trimmed to appropriate length based on guidewire measurement. At the venotomy, an 8 Fr peel-away sheath was placed over a guidewire. The catheter  was then placed through the sheath and the sheath removed. Final catheter positioning was confirmed and documented with a fluoroscopic spot image. The port was accessed with a needle and aspirated and flushed with heparinized saline. The needle was removed. The venotomy and port pocket incisions were closed with subcutaneous 3-0 Monocryl and subcuticular 4-0 Vicryl. Dermabond was applied to both incisions. COMPLICATIONS: None FINDINGS: After catheter placement, the tip lies at the cavoatrial junction. The catheter aspirates normally and is ready for immediate use. IMPRESSION: Placement of single lumen port a cath via right internal jugular vein. The catheter tip lies at the cavoatrial junction. A power injectable port a cath was placed and is ready for immediate use. Electronically Signed   By: Aletta Edouard M.D.   On: 04/05/2016 18:03    Assessment & Plan:   Prayer was seen today for dysphagia.  Diagnoses and all orders for this visit:  Dysphagia   I have discontinued Ms. Fuhr's predniSONE. I am also having her  maintain her Multiple Vitamins-Minerals (CENTRUM SILVER PO), Vitamin D3, ondansetron, feeding supplement (ENSURE ENLIVE), pantoprazole, polyethylene glycol, atenolol, potassium chloride SA, LORazepam, clorazepate, lidocaine-prilocaine, FERROCITE, and vitamin C.  No orders of the defined types were placed in this encounter.    Follow-up: No Follow-up on file.  Walker Kehr, MD

## 2016-06-23 NOTE — Progress Notes (Signed)
Pre visit review using our clinic review tool, if applicable. No additional management support is needed unless otherwise documented below in the visit note. 

## 2016-06-23 NOTE — Patient Instructions (Signed)
Esophageal Stricture °Esophageal stricture is a condition that causes the esophagus to become narrow. The esophagus is the long tube in your throat that carries food and liquid from your mouth to your stomach. Esophageal stricture can make it difficult, painful, or even impossible to swallow. The condition also makes choking more likely.  °CAUSES  °Gastroesophageal reflux disease (GERD) is the most common cause of esophageal stricture. In GERD, stomach acid backs up into the esophagus. Over time, this causes scar tissue and leads to narrowing (stricture). °Other causes of esophageal stricture include: °· Scarring from ingesting a harmful substance. °· Damage from medical instruments used in the esophagus. °· Radiation therapy. °· Cancer. °RISK FACTORS °You are at greater risk for esophageal stricture if you have GERD or esophageal cancer. °SIGNS AND SYMPTOMS  °Signs and symptoms of esophageal stricture include: °· Difficulty swallowing. °· Pain when swallowing. °· Heartburn. °· Vomiting or spitting up (regurgitating) food or liquids. °· Weight loss.   °DIAGNOSIS  °Your health care provider may suspect esophageal stricture based on your symptoms. A physical exam will also be done. You may need tests to confirm the diagnosis. These can include: °· Upper endoscopy. Your health care provider will insert a flexible tube with a tiny camera on it (endoscope) into your esophagus to check for a stricture. A tissue sample may also be taken to be examined under a microscope (biopsy). °· Esophageal pH monitoring. This test involves collecting acid in the esophagus with a tube to determine how much stomach acid is entering the esophagus. °· Barium swallow test. For this test, you will drink a barium solution that coats the lining of the esophagus. Then you will have an X-ray taken. The barium solution helps to show if there is stricture. °TREATMENT °Treatment for esophageal stricture depends on what is causing your condition and  how severe it is. Treatment options include: °· Esophageal dilatation. In this procedure, a health care provider inserts an endoscope or a tool called a dilator into your esophagus to gently stretch it and make the opening wider. °· Stents. In some cases, your health care provider may place a small device (stent) in the esophagus to keep it open. °· Acid-blocking medicines. Taking these helps manage GERD symptoms after an esophageal stricture. This can prevent the stricture from returning. °HOME CARE INSTRUCTIONS °· Do not drink alcohol. °· Do not use any tobacco products, including cigarettes, chewing tobacco, or electronic cigarettes. If you need help quitting, ask your health care provider. °· Lose weight if you are overweight. °· Wear loose, comfortable clothing. °· Do not eat for 3 hours before bedtime. °· Elevate your head in bed with pillows. °· Do not overeat at meals. °· Do not eat foods that make reflux worse. These include: °¨ Fatty foods. °¨ Spicy foods. °¨ Soda. °¨ Tomato products. °¨ Chocolate. °SEEK MEDICAL CARE IF: °· You have problems eating or swallowing. °· You regurgitate food and liquid. °MAKE SURE YOU: °· Understand these instructions. °· Will watch your condition. °· Will get help right away if you are not doing well or get worse. °  °This information is not intended to replace advice given to you by your health care provider. Make sure you discuss any questions you have with your health care provider. °  °Document Released: 06/12/2006 Document Revised: 10/23/2014 Document Reviewed: 02/11/2014 °Elsevier Interactive Patient Education ©2016 [employer] ° °

## 2016-06-26 ENCOUNTER — Other Ambulatory Visit: Payer: Self-pay | Admitting: Internal Medicine

## 2016-06-26 NOTE — Telephone Encounter (Signed)
Left message for patient to return my call.

## 2016-06-26 NOTE — Telephone Encounter (Signed)
Pt called back and said she is feeling much better.  She said her PCP prescribed her some meds and they are working.  Will CB if she needs to schedule

## 2016-06-27 ENCOUNTER — Telehealth: Payer: Self-pay

## 2016-06-27 NOTE — Telephone Encounter (Signed)
PA initiated and APPROVED via CoverMyMeds key [id number]

## 2016-06-29 ENCOUNTER — Ambulatory Visit (INDEPENDENT_AMBULATORY_CARE_PROVIDER_SITE_OTHER): Payer: Commercial Managed Care - HMO | Admitting: Gastroenterology

## 2016-06-29 ENCOUNTER — Encounter: Payer: Self-pay | Admitting: Gastroenterology

## 2016-06-29 VITALS — BP 134/80 | HR 82 | Ht 66.0 in | Wt 155.0 lb

## 2016-06-29 DIAGNOSIS — R1013 Epigastric pain: Secondary | ICD-10-CM | POA: Diagnosis not present

## 2016-06-29 DIAGNOSIS — R1310 Dysphagia, unspecified: Secondary | ICD-10-CM

## 2016-06-29 NOTE — Progress Notes (Signed)
06/29/2016 Sharon Holt [id number] 1941-09-17   HISTORY OF PRESENT ILLNESS:  This is a 75 year old female who is previously known to Dr. [name]. 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  She presents to our office today at the request of her PCP, Dr. Alain Marion, for evaluation of difficulty swallowing. She tells me that this just began a couple of weeks ago with both solid foods and liquids. She says that they have started her on AcipHex 20 mg twice daily, which she has only been taking for the past week and her issue swallowing has already resolved. She has now been on a soft diet without any further issues and they have told her to avoid spicy and citrus-type foods.  She still has some epigastric pain/discomfort and says that that has been present for a while now.  Also mentions some diarrhea after eating at times, but contributes this to not having a gallbladder.  Her last colonoscopy was by Dr. [name] in October 2012 at which time she had mild diverticulosis of the sigmoid colon to the descending colon and was otherwise normal. Her recall was listed for 5 years, but it has been postponed and the light of her other current cancer states.   Past Medical History:  Diagnosis Date  . Anemia, unspecified   . Anxiety state, unspecified   . 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 History of brachytherapy 06/01/15, 06/10/15, 06/17/15, 06/24/15, 07/01/15   vaginal cuff 27.5 gray  . Malignant neoplasm of anal canal ([place]) 15 yrs ago   treated with radiation   . Mitral valve disorders   . Osteoporosis   . Other abnormal glucose   . Other and unspecified hyperlipidemia   . Transfusion history    "back in my 20's"  . Unspecified essential hypertension    Past  Surgical History:  Procedure Laterality Date  . BREAST LUMPECTOMY     right breast BENIGN  . CHOLECYSTECTOMY    . ROBOTIC ASSISTED TOTAL HYSTERECTOMY WITH BILATERAL SALPINGO OOPHERECTOMY Bilateral 04/20/2015   Procedure: ROBOTIC ASSISTED TOTAL HYSTERECTOMY WITH BILATERAL SALPINGO OOPHORECTOMY AND PELVIC AND PERIAORTIC LYMPHADECTOMY;  Surgeon: Janie Morning, MD;  Location: [facility];  Service: Gynecology;  Laterality: Bilateral;  . TONSILLECTOMY      reports that she has quit smoking. She has never used smokeless tobacco. She reports that she does not drink alcohol or use drugs. family history includes Breast cancer in her sister; Breast cancer (age of onset: 48) in her sister; Dementia in her brother; Leukemia in her sister; Prostate cancer in her brother; Stroke in her father. No Known Allergies    Outpatient Encounter Prescriptions as of 06/29/2016  Medication Sig  . atenolol (TENORMIN) 25 MG tablet take 3 tablets by mouth once daily  . Cholecalciferol (VITAMIN D3) 2000 UNITS capsule Take 2,000 Units by mouth daily.   . clorazepate (TRANXENE) 7.5 MG tablet Take 1 tablet (7.5 mg total) by mouth 2 (two) times daily as needed for anxiety.  . feeding supplement, ENSURE ENLIVE, (ENSURE ENLIVE) LIQD Take 237 mLs by mouth 2 (two) times daily between meals.  . FERROCITE 324 MG TABS tablet Take 1 tablet (106 mg of iron total) by mouth daily.  Marland Kitchen lidocaine-prilocaine (EMLA) cream Apply 1-2 hours prior to [facility] Access as  needed.  Marland Kitchen LORazepam (ATIVAN) 0.5 MG tablet Place 1 tablet under the tongue or swallow every 6 hours as needed for nausea.  . Multiple Vitamins-Minerals (CENTRUM SILVER PO) Take 1 capsule by mouth daily.   . ondansetron (ZOFRAN) 8 MG tablet Take 0.5-1 tablets (4-8 mg total) by mouth every 8 (eight) hours as needed for nausea, vomiting or refractory nausea / vomiting. Will NOT make drowsy.  . polyethylene glycol (MIRALAX / GLYCOLAX) packet Take 17 g by mouth daily. Reported on 12/09/2015  .  potassium chloride SA (K-DUR,KLOR-CON) 20 MEQ tablet Take 1 tablet (20 mEq total) by mouth daily.  . RABEprazole (ACIPHEX) 20 MG tablet Take 1 tablet (20 mg total) by mouth 2 (two) times daily.  . vitamin C (ASCORBIC ACID) 500 MG tablet Take 500 mg by mouth daily.  . [DISCONTINUED] atenolol (TENORMIN) 25 MG tablet Take 3 tablets (75 mg total) by mouth daily.   No facility-administered encounter medications on file as of 06/29/2016.      REVIEW OF SYSTEMS  : All other systems reviewed and negative except where noted in the History of Present Illness.   PHYSICAL EXAM: BP 134/80   Pulse 82   Ht 5\' 6"  (1.676 m)   Wt 155 lb (70.3 kg)   BMI 25.02 kg/m  General: Well developed black female in no acute distress Head: Normocephalic and atraumatic Eyes:  Sclerae anicteric, conjunctiva pink. Ears: Normal auditory acuity Lungs: Clear throughout to auscultation Heart: Regular rate and rhythm Abdomen: Soft, non-distended.  Normal bowel sounds.  Mild epigastric TTP. Musculoskeletal: Symmetrical with no gross deformities  Skin: No lesions on visible extremities Extremities: No edema  Neurological: Alert oriented x 4, grossly non-focal Psychological:  Alert and cooperative. Normal mood and affect  ASSESSMENT AND PLAN: -Dysphagia:  Resolved after taking Aciphex for one week.  Likely reflux related. -Epigastric pain:  ? If this is also reflux related but just needs more time on PPI to assess response.  She also has carcinomatosis so how much of this could be from that as well?  She will continue the Aciphex 20 mg BID for now and will call back in 3 more weeks with an update on her symptoms.  If still no improvement then may start with an esophagram/UGI to assess first. 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

## 2016-06-29 NOTE — Progress Notes (Signed)
Thank you for sending this case to me. I have reviewed the entire note, and the outlined plan seems appropriate.  Yes, I think barium swallow would be the most appropriate next step if this recurs.

## 2016-06-29 NOTE — Patient Instructions (Signed)
Continue Aciphex 20 mg , take 1 tab twice daily. Call us back in 3 weeks with an update on your symptoms.  You can ask for [place] or Sharon Holt, Physiological scientist.

## 2016-07-02 ENCOUNTER — Other Ambulatory Visit: Payer: Self-pay | Admitting: Oncology

## 2016-07-03 ENCOUNTER — Ambulatory Visit: Payer: Commercial Managed Care - HMO

## 2016-07-03 ENCOUNTER — Encounter: Payer: Self-pay | Admitting: Oncology

## 2016-07-03 ENCOUNTER — Ambulatory Visit (HOSPITAL_BASED_OUTPATIENT_CLINIC_OR_DEPARTMENT_OTHER): Payer: Commercial Managed Care - HMO | Admitting: Oncology

## 2016-07-03 ENCOUNTER — Other Ambulatory Visit (HOSPITAL_BASED_OUTPATIENT_CLINIC_OR_DEPARTMENT_OTHER): Payer: Commercial Managed Care - HMO

## 2016-07-03 VITALS — BP 159/64 | HR 74 | Temp 98.2°F | Resp 18 | Ht 66.0 in | Wt 155.4 lb

## 2016-07-03 DIAGNOSIS — C801 Malignant (primary) neoplasm, unspecified: Secondary | ICD-10-CM

## 2016-07-03 DIAGNOSIS — E639 Nutritional deficiency, unspecified: Secondary | ICD-10-CM

## 2016-07-03 DIAGNOSIS — C541 Malignant neoplasm of endometrium: Secondary | ICD-10-CM

## 2016-07-03 DIAGNOSIS — N189 Chronic kidney disease, unspecified: Secondary | ICD-10-CM

## 2016-07-03 DIAGNOSIS — C786 Secondary malignant neoplasm of retroperitoneum and peritoneum: Secondary | ICD-10-CM

## 2016-07-03 DIAGNOSIS — Z95828 Presence of other vascular implants and grafts: Secondary | ICD-10-CM

## 2016-07-03 DIAGNOSIS — R180 Malignant ascites: Secondary | ICD-10-CM | POA: Diagnosis not present

## 2016-07-03 DIAGNOSIS — R634 Abnormal weight loss: Secondary | ICD-10-CM | POA: Diagnosis not present

## 2016-07-03 DIAGNOSIS — D631 Anemia in chronic kidney disease: Secondary | ICD-10-CM

## 2016-07-03 LAB — CBC WITH DIFFERENTIAL/PLATELET
BASO%: 0.3 % (ref 0.0–2.0)
Basophils Absolute: 0 10*3/uL (ref 0.0–0.1)
EOS ABS: 0 10*3/uL (ref 0.0–0.5)
EOS%: 0.6 % (ref 0.0–7.0)
HEMATOCRIT: 32.5 % — AB (ref 34.8–46.6)
HEMOGLOBIN: 10.8 g/dL — AB (ref 11.6–15.9)
LYMPH%: 23.7 % (ref 14.0–49.7)
MCH: 30.6 pg (ref 25.1–34.0)
MCHC: 33.2 g/dL (ref 31.5–36.0)
MCV: 92.1 fL (ref 79.5–101.0)
MONO#: 0.7 10*3/uL (ref 0.1–0.9)
MONO%: 19.6 % — ABNORMAL HIGH (ref 0.0–14.0)
NEUT#: 1.9 10*3/uL (ref 1.5–6.5)
NEUT%: 55.8 % (ref 38.4–76.8)
Platelets: 185 10*3/uL (ref 145–400)
RBC: 3.53 10*6/uL — ABNORMAL LOW (ref 3.70–5.45)
RDW: 14.2 % (ref 11.2–14.5)
WBC: 3.4 10*3/uL — ABNORMAL LOW (ref 3.9–10.3)
lymph#: 0.8 10*3/uL — ABNORMAL LOW (ref 0.9–3.3)

## 2016-07-03 LAB — COMPREHENSIVE METABOLIC PANEL
ALBUMIN: 3.1 g/dL — AB (ref 3.5–5.0)
ALK PHOS: 59 U/L (ref 40–150)
ALT: 18 U/L (ref 0–55)
AST: 35 U/L — AB (ref 5–34)
Anion Gap: 9 mEq/L (ref 3–11)
BUN: 11.7 mg/dL (ref 7.0–26.0)
CALCIUM: 9.3 mg/dL (ref 8.4–10.4)
CHLORIDE: 105 meq/L (ref 98–109)
CO2: 26 mEq/L (ref 22–29)
CREATININE: 0.8 mg/dL (ref 0.6–1.1)
EGFR: 81 mL/min/{1.73_m2} — ABNORMAL LOW (ref >90)
Glucose: 117 mg/dl (ref 70–140)
Potassium: 3.6 mEq/L (ref 3.5–5.1)
Sodium: 140 mEq/L (ref 136–145)
Total Bilirubin: 0.39 mg/dL (ref 0.20–1.20)
Total Protein: 7.3 g/dL (ref 6.4–8.3)

## 2016-07-03 MED ORDER — SODIUM CHLORIDE 0.9 % IJ SOLN
10.0000 mL | INTRAMUSCULAR | Status: DC | PRN
  Administered 2016-07-03: 10 mL via INTRAVENOUS
  Filled 2016-07-03: qty 10

## 2016-07-03 MED ORDER — PEGFILGRASTIM INJECTION 6 MG/0.6ML ~~LOC~~: 6.0000 mg | PREFILLED_SYRINGE | Freq: Once | SUBCUTANEOUS | Status: DC

## 2016-07-03 MED ORDER — SODIUM CHLORIDE 0.9% FLUSH
10.0000 mL | INTRAVENOUS | Status: DC | PRN
  Administered 2016-07-03: 10 mL via INTRAVENOUS
  Filled 2016-07-03: qty 10

## 2016-07-03 MED ORDER — HEPARIN SOD (PORK) LOCK FLUSH 100 UNIT/ML IV SOLN
500.0000 [IU] | Freq: Once | INTRAVENOUS | Status: AC
  Administered 2016-07-03: 500 [IU] via INTRAVENOUS
  Filled 2016-07-03: qty 5

## 2016-07-03 NOTE — Patient Instructions (Signed)

## 2016-07-03 NOTE — Progress Notes (Signed)
OFFICE PROGRESS NOTE   July 03, 2016   Physicians: W.Brewster/ Emma Rossi,Plotnikov, [name], MD, Gery Pray, [facility] [name]), [name]  INTERVAL HISTORY:  Patient is seen, together with husband, in continuing attention to recurrent high grade endometrial carcinoma, which does not appear to have responded to 3   cycles of doxil thru 06-05-16. She saw Dr Skeet Latch on 06-15-16, who would consider change to temsirolimus and metformin. Last imaging was CT AP 03-31-16, will repeat prior to new treatment regimen. She will see Dr Skeet Latch again on 08-03-16.  She is to see Dr Alain Marion again on 07-21-16, now on Aciphex for swallowing symptoms, which has improved that problem. Per Dr Plotnikov's note , he may have intended her to stop prednisone, however she is still taking that (?, not listed in EMR meds).  Patient reports that she is feelng fairly well, other than poor appetite. She went to church this weekend, but generally is not very active. She is eating only ~ once daily, does drink ~ 1 Ensure daily and 1 protein drink daily.  She had felt unable to swallow solid food prior to the Aciphex, now able to swallow including meat and bread, does not choke on liquids, no N/V, taste ok. Bowels are moving. She denies abdominal or pelvic discomfort, is not aware of increased distension in abdomen. Bladder ok. No swelling LE. No increased SOB or cough. Is able to sleep. No problems with PAC. No bleeding. Palms and soles not tender. Remainder of 10 point Review of Systems negative.  PAC in No genetics testing. MMR normal on surgical path 04-2015  CA 125 09-14-15 2427.    CA 125 on 03-20-16 was 713, as baseline for doxil (began 04-10-16) ER PR  negative surgical path 04-20-15  ([id number]) High dose flu vaccine done 06-23-16   NOTE husband is diabetic, is on metformin, has glucometer and strips available, reportedly knows how to use, but does not use these himself.   ONCOLOGIC  HISTORY Patient had regular medical care by PCP Dr Alain Marion and gyn Dr Garwin Brothers. In spring 2016, she noticed low pelvic discomfort and some intermittent vaginal spotting. She was seen for regular visit by Dr Garwin Brothers in May 2016, with PAP positive for AGUS and US showing endometrial stripe 8.38m. Endometrial biopsy 03-12-15 showed high grade adenocarcinoma with papillary features, however immunostains were not typical for serous carcinoma. She was seen by Dr RDenman Georgein June. CT CAP 04-14-15 showed no evidence of metastatic disease in chest, a 3.3 x 2.8 cm endometrial mas and 3.1 cm cystic lesion of right ovary. She had robotic total laparoscopic hysterectomy BSO with [facility] and [facility] Pathology ([id number] grade 2 endometrioid adenocarcinoma invading 0.35 cm where myometrium 1.1 cm, 26 nodes negative and no LVSI. She had intracavitary radiation due to risk for vaginal cuff recurrence from 06-01-15 thru 07-01-15, vaginal cuff 27.5 gray in 5 fractions. There were no obvious concerns when she was seen by Dr RDenman Georgein late July or Dr KSondra Comeand Dr PAlain Marionin October. She presented to ED on 09-06-15 with generalized abdominal pain, bloating and constipation x 1 month. CT AP 09-06-15 showed extensive nodularity and stranding thruout omentum and large volume ascites, with liver/spleen/pancreas/adrenals not remarkable, no bowel obstruction. CA 125 was 2427. She had UKoreaparacentesi on 09-07-15 for 5 liters of serous fluid, cytology (([id number] adenocarcinoma. She had consultation with Dr RDenman Georgeon 09-13-15, recommendation for systemic chemotherapy. Review of cytology from  ascites compared with surgical specimen and with the initial endometrial biopsy read at [facility] shows the same high grade features that were seen on the initial endometrial biopsy (not seen on the full surgical path); ER negative. She had repeat US paracentesis for 5.2 liters on 09-20-15.  She had first carboplatin taxol on 09-21-15 and was neutropenic by day 8 cycle 1, granix begun. She had cycle 2 at same doses (carbo AUC = 3.5) 0n 10-13-15 with neulasta, again neutropenic such that doses were adjusted subsequently. CT AP 01-12-16 after cycle 6 showed improvement, tho still evidence of disease; CA 125 was 683 with cycle 6 chemo. She received additional 3 cycles thru cycle 9 on 03-07-16, with progressive peripheral neuropathy despite decrease in taxol dose, and increase in CA 125 to 713 on 03-20-16. CT AP 03-31-16 confirmed progression of omental and peritoneal involvement and some increase in ascites. She began doxil on 04-10-16.  Objective:  Vital signs in last 24 hours:  BP (!) 159/64 (BP Location: Right Arm, Patient Position: Sitting)   Pulse 74   Temp 98.2 F (36.8 C) (Oral)   Resp 18   Ht '5\' 6"'$  (1.676 m)   Wt 155 lb 6.4 oz (70.5 kg)   SpO2 100%   BMI 25.08 kg/m  Weight down 6 lbs from early Aug.  Alert, oriented and appropriate, does not appear uncomfortable. Ambulatory without difficulty.  Wearing wig  HEENT:PERRL, sclerae not icteric. Oral mucosa moist without lesions, posterior pharynx clear.  Neck supple. No JVD.  Lymphatics:no supraclavicular adenopathy Resp: clear to auscultation bilaterally and normal percussion bilaterally Cardio: regular rate and rhythm. No gallop. GI: soft, nontender, somewhat distended, apparent fluid wave, no mass or organomegaly. Normally active bowel sounds.  Musculoskeletal/ Extremities: without pitting edema, cords, tenderness Neuro: no peripheral neuropathy. Otherwise nonfocal. PSYCH appropriate mood and affect Skin without rash, ecchymosis, petechiae Portacath-without erythema or tenderness  Lab Results:  Results for orders placed or performed in visit on 07/03/16  CBC with Differential  Result Value Ref Range   WBC 3.4 (L) 3.9 - 10.3 10e3/uL   NEUT# 1.9 1.5 - 6.5 10e3/uL   HGB 10.8 (L) 11.6 - 15.9 g/dL   HCT 32.5 (L) 34.8 -  46.6 %   Platelets 185 145 - 400 10e3/uL   MCV 92.1 79.5 - 101.0 fL   MCH 30.6 25.1 - 34.0 pg   MCHC 33.2 31.5 - 36.0 g/dL   RBC 3.53 (L) 3.70 - 5.45 10e6/uL   RDW 14.2 11.2 - 14.5 %   lymph# 0.8 (L) 0.9 - 3.3 10e3/uL   MONO# 0.7 0.1 - 0.9 10e3/uL   Eosinophils Absolute 0.0 0.0 - 0.5 10e3/uL   Basophils Absolute 0.0 0.0 - 0.1 10e3/uL   NEUT% 55.8 38.4 - 76.8 %   LYMPH% 23.7 14.0 - 49.7 %   MONO% 19.6 (H) 0.0 - 14.0 %   EOS% 0.6 0.0 - 7.0 %   BASO% 0.3 0.0 - 2.0 %  Comprehensive metabolic panel  Result Value Ref Range   Sodium 140 136 - 145 mEq/L   Potassium 3.6 3.5 - 5.1 mEq/L   Chloride 105 98 - 109 mEq/L   CO2 26 22 - 29 mEq/L   Glucose 117 70 - 140 mg/dl   BUN 11.7 7.0 - 26.0 mg/dL   Creatinine 0.8 0.6 - 1.1 mg/dL   Total Bilirubin 0.39 0.20 - 1.20 mg/dL   Alkaline Phosphatase 59 40 - 150 U/L   AST 35 (H) 5 - 34 U/L  ALT 18 0 - 55 U/L   Total Protein 7.3 6.4 - 8.3 g/dL   Albumin 3.1 (L) 3.5 - 5.0 g/dL   Calcium 9.3 8.4 - 10.4 mg/dL   Anion Gap 9 3 - 11 mEq/L   EGFR 81 (L) >90 ml/min/1.73 m2    CA 125 05-25-16  1320, this having been 713 on 03-20-16 and 669 on 02-28-16 Studies/Results:  No results found.  Did not have swallowing study or other GI evaluation, however swallowing seems back to baseline now on Aciphex  Medications: I have reviewed the patient's current medications. She reports that she is taking same prednisone.   DISCUSSION Patient agrees to repeat scans prior to considering another regimen. I have told them that everolimus will need authorization by insurance as not FDA approved for endometrial cancer. We have discussed interest in metformin in cancers as it also has mTOR inhibition; hypoglycemia will be possible adverse effect in Mrs Clover, as blood sugars are generally low normal and she is not eating well. I have encouraged her to try to drink some Ensure or protein drink more often thru day, to see if this will be tolerable prior to starting metformin;  will start at 500 mg in AM if so.  I have reiterated to patient and husband that all treatment is in attempt to control disease, as this is not potentially curable.  Literature that I have found has used temsirolimus 25 mg IV weekly with metformin 500 - 2000 mg daily; one study used temsirolimus 20 mg IV weekly + 500 metformin.  She will need teaching for temsirolimus with next visit. Alternatively could consider Megace even with ER PR negative, ~ 80 mg bid. If she decides against further treatment, or does not tolerate this, she would be Hospice eligible.  Assessment/Plan:  1. 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 Carbo taxol x 9 cycles from 09-21-15 thru 03-07-16, then doxil x 4 from 04-10-16 thru 06-05-16. CA 125 has been marker, no improvement on doxil. Will repeat CTs prior to possible temsirolimus + metformin.  2. 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 Managed by Dr Alain Marion 8.post cholecystectomy and up to date colonoscopy and mammograms 9..squamous cell carcinoma anus 2003: radiation and (?) chemo then, may be impacting counts with recent chemo. 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 Slight improvement since off of taxol. Follow. 11.has been given information for advance directives, still not completed per EMR 12.po intake not optimal, particularly if try to add metformin. Recommendations as above  All questions answered. Temsirolimus orders placed /  managed care notified for preauth. Route Dr Alain Marion and will update Dr Skeet Latch when plans finalized. Time spent 30 min including >50% counseling and coordination of care.     Evlyn Clines, MD   07/03/2016, 7:20 PM

## 2016-07-05 ENCOUNTER — Encounter: Payer: Self-pay | Admitting: Oncology

## 2016-07-05 DIAGNOSIS — C541 Malignant neoplasm of endometrium: Secondary | ICD-10-CM

## 2016-07-05 HISTORY — DX: Malignant neoplasm of endometrium: C54.1

## 2016-07-05 NOTE — Progress Notes (Signed)
START OFF PATHWAY REGIMEN - Uterine  Off Pathway: Temsirolimus Weekly (4 weeks per order sheet)  [id number]:Temsirolimus Weekly (4 weeks per order sheet):   Administer weekly:     Temsirolimus (Torisel(R)) 25 mg flat dose in 250 mL NS IV over 60 minutes Dose Mod: None Additional Orders: Treat until progression of disease or intolerable toxicity. Dose reduction required in mild hepatic impairment.  **Always confirm dose/schedule in your pharmacy ordering system**    Patient Characteristics: Endometrioid Histology, Third Line and Beyond, MSS/pMMR AJCC T Stage: X AJCC N Stage: X Stage Grouping: IVB AJCC M Stage: X Line of therapy: Third Line and Beyond Would you be surprised if this patient died  in the next year? I would NOT be surprised if this patient died in the next year Microsatellite/Mismatch Repair Status: MSS/pMMR  Intent of Therapy: Non-Curative / Palliative Intent, Discussed with Patient

## 2016-07-10 ENCOUNTER — Ambulatory Visit (HOSPITAL_COMMUNITY)
Admission: RE | Admit: 2016-07-10 | Discharge: 2016-07-10 | Disposition: A | Discharge status: Home / Self Care | Payer: Commercial Managed Care - HMO | Source: Ambulatory Visit | Attending: Oncology | Admitting: Oncology

## 2016-07-10 DIAGNOSIS — C541 Malignant neoplasm of endometrium: Secondary | ICD-10-CM | POA: Diagnosis present

## 2016-07-10 DIAGNOSIS — K449 Diaphragmatic hernia without obstruction or gangrene: Secondary | ICD-10-CM | POA: Insufficient documentation

## 2016-07-10 DIAGNOSIS — R188 Other ascites: Secondary | ICD-10-CM | POA: Diagnosis not present

## 2016-07-10 DIAGNOSIS — I700 Atherosclerosis of aorta: Secondary | ICD-10-CM | POA: Insufficient documentation

## 2016-07-10 DIAGNOSIS — C786 Secondary malignant neoplasm of retroperitoneum and peritoneum: Secondary | ICD-10-CM | POA: Insufficient documentation

## 2016-07-10 MED ORDER — IOPAMIDOL (ISOVUE-300) INJECTION 61%
100.0000 mL | Freq: Once | INTRAVENOUS | Status: AC | PRN
  Administered 2016-07-10: 100 mL via INTRAVENOUS

## 2016-07-12 ENCOUNTER — Other Ambulatory Visit: Payer: Self-pay | Admitting: Oncology

## 2016-07-13 ENCOUNTER — Encounter: Payer: Self-pay | Admitting: Oncology

## 2016-07-13 ENCOUNTER — Ambulatory Visit (HOSPITAL_BASED_OUTPATIENT_CLINIC_OR_DEPARTMENT_OTHER): Payer: Commercial Managed Care - HMO | Admitting: Oncology

## 2016-07-13 VITALS — BP 154/85 | HR 91 | Temp 98.2°F | Resp 18 | Ht 66.0 in | Wt 159.2 lb

## 2016-07-13 DIAGNOSIS — C786 Secondary malignant neoplasm of retroperitoneum and peritoneum: Secondary | ICD-10-CM

## 2016-07-13 DIAGNOSIS — C541 Malignant neoplasm of endometrium: Secondary | ICD-10-CM

## 2016-07-13 DIAGNOSIS — C801 Malignant (primary) neoplasm, unspecified: Secondary | ICD-10-CM

## 2016-07-13 DIAGNOSIS — K219 Gastro-esophageal reflux disease without esophagitis: Secondary | ICD-10-CM | POA: Diagnosis not present

## 2016-07-13 DIAGNOSIS — R188 Other ascites: Secondary | ICD-10-CM

## 2016-07-13 DIAGNOSIS — K5669 Other intestinal obstruction: Secondary | ICD-10-CM

## 2016-07-13 DIAGNOSIS — Z95828 Presence of other vascular implants and grafts: Secondary | ICD-10-CM

## 2016-07-13 DIAGNOSIS — R180 Malignant ascites: Secondary | ICD-10-CM | POA: Diagnosis not present

## 2016-07-13 DIAGNOSIS — K56600 Partial intestinal obstruction, unspecified as to cause: Secondary | ICD-10-CM

## 2016-07-13 IMAGING — CT CT ABD-PELV W/ CM
2 of 5 series · 16 of 46 positions shown, 18 images · IV contrast (iopamidol)
Comparison: 01/12/2016

CLINICAL DATA: Recurrent endometrial cancer with peritoneal
carcinomatosis

EXAM:
CT ABDOMEN AND PELVIS WITH CONTRAST
TECHNIQUE: Multidetector CT imaging of the abdomen and pelvis was performed
using the standard protocol following bolus administration of
intravenous contrast.
CONTRAST:  100mL C4FMQY-KOO IOPAMIDOL (C4FMQY-KOO) INJECTION 61%

[Series 2: rtn a/p with · axial · 0.77mm/px · z∈[-443,-58]mm · 13 of 89 slices shown, 15 images]
[im 6/89  soft-tissue]
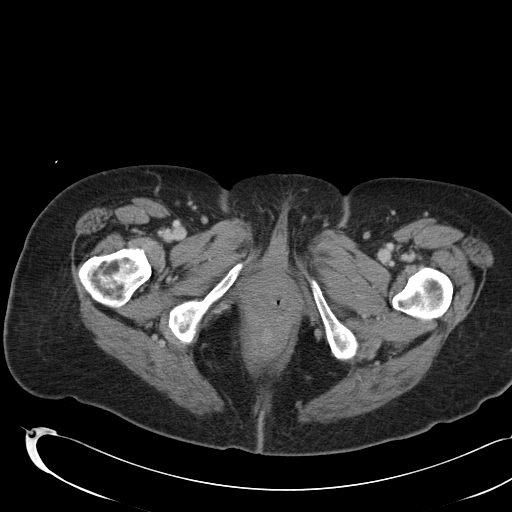
[im 6/89  bone]
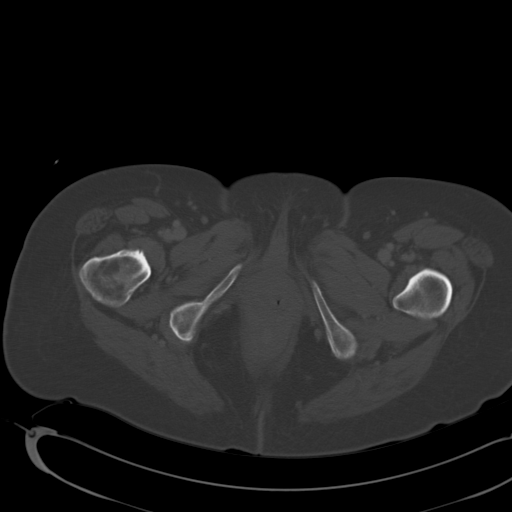
[im 12/89  soft-tissue]
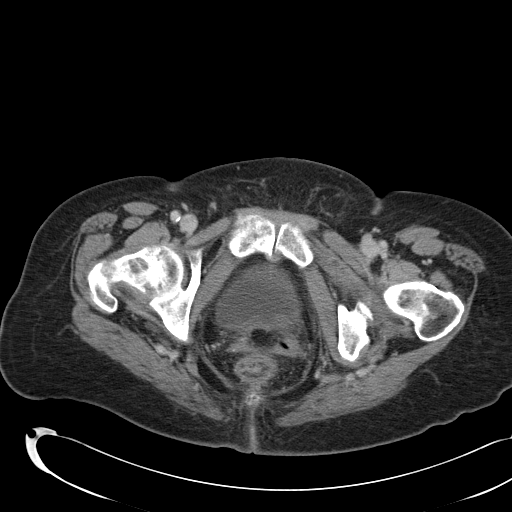
[im 18/89  soft-tissue]
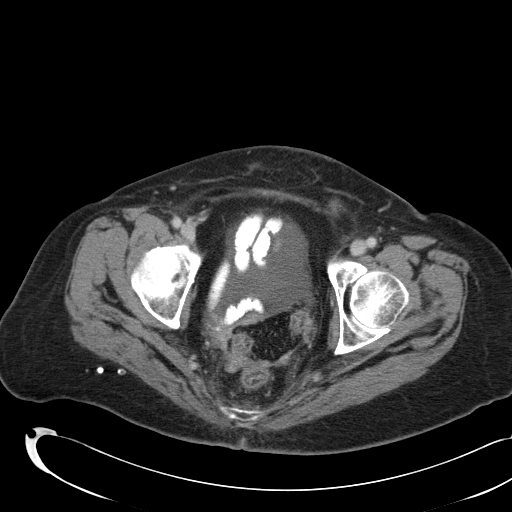
[im 24/89  soft-tissue]
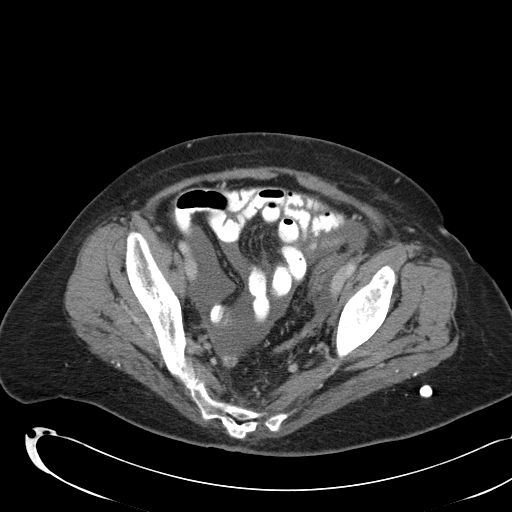
[im 30/89  soft-tissue]
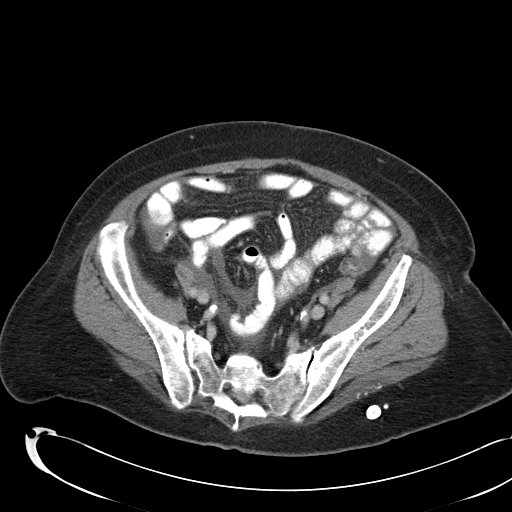
[im 36/89  soft-tissue]
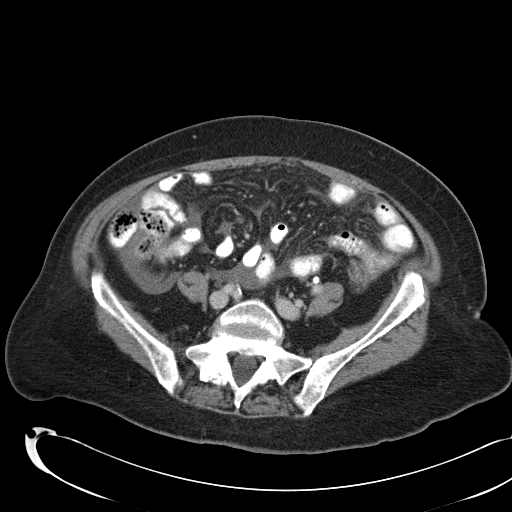
[im 47/89  soft-tissue]
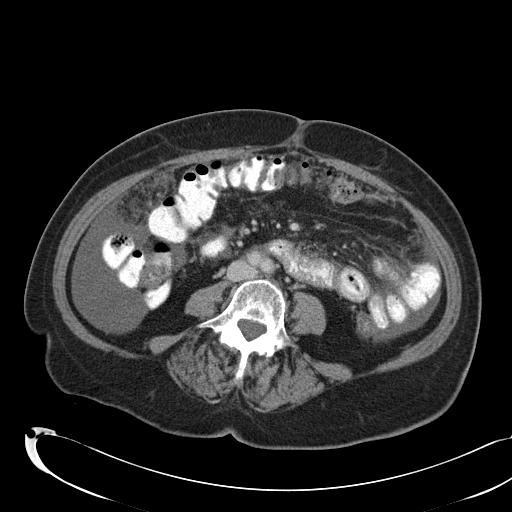
[im 53/89  soft-tissue]
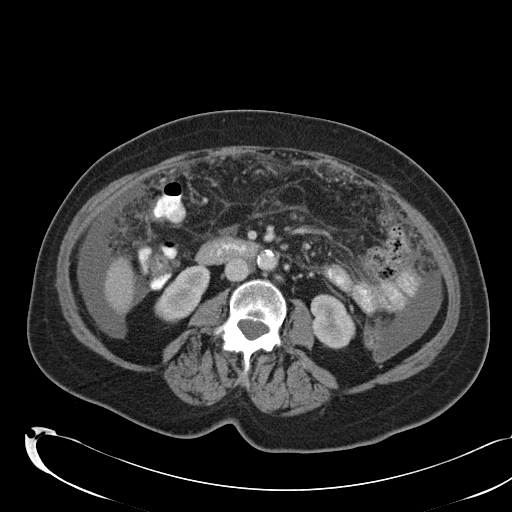
[im 59/89  soft-tissue]
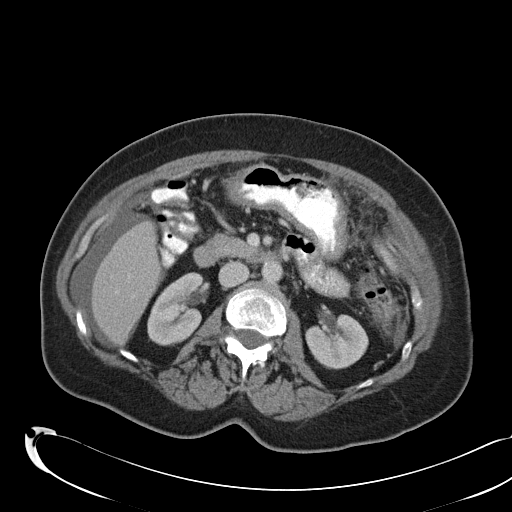
[im 59/89  bone]
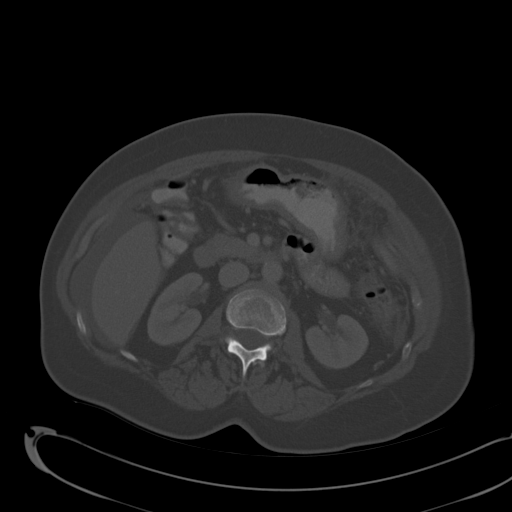
[im 65/89  soft-tissue]
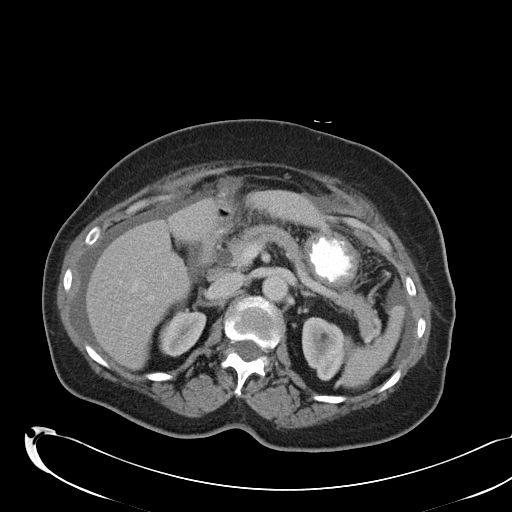
[im 71/89  soft-tissue]
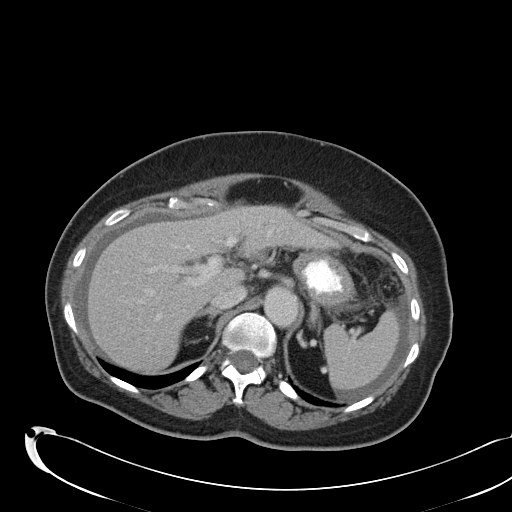
[im 77/89  soft-tissue]
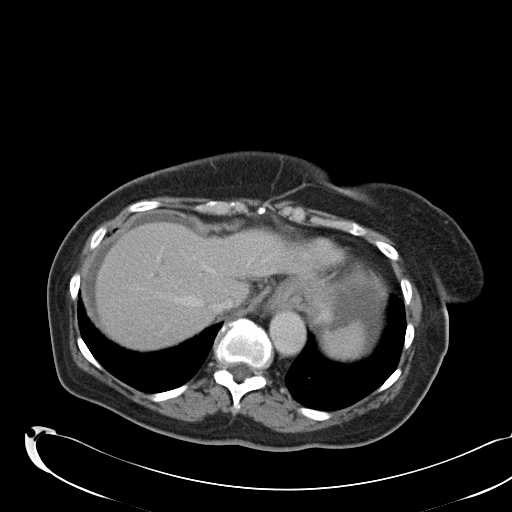
[im 83/89  soft-tissue]
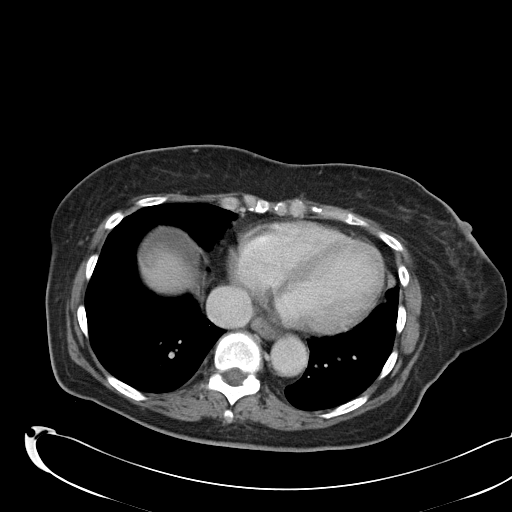

[Series 602: cor · coronal · 0.86mm/px · 3 of 123 slices shown]
[im 41/123  soft-tissue]
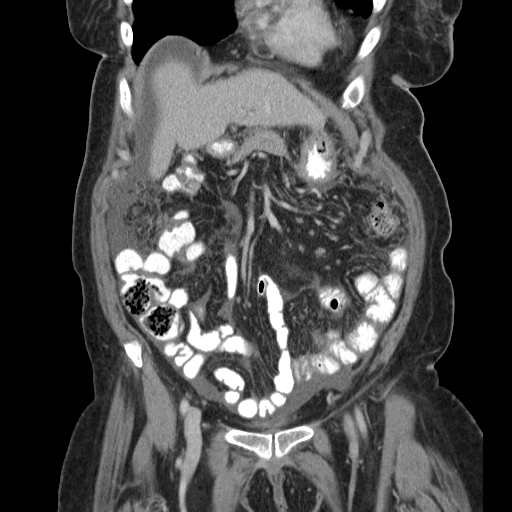
[im 55/123  soft-tissue]
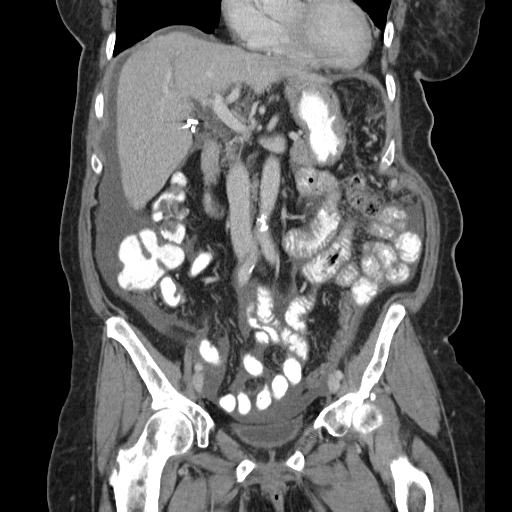
[im 68/123  soft-tissue]
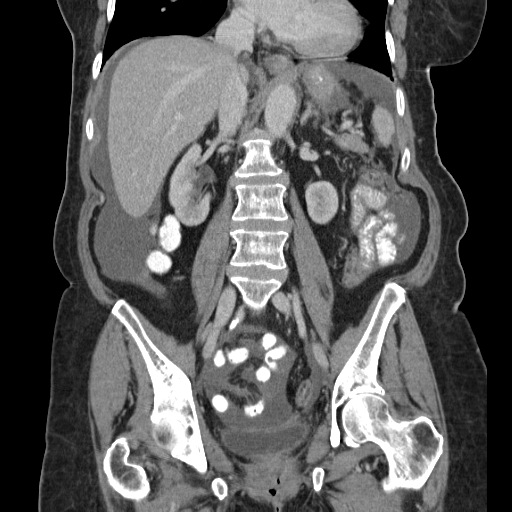

[16 of 46 positions shown; findings below may reference images not displayed]

FINDINGS: Lower chest:  Lung bases are clear.

Hepatobiliary: Liver is within normal limits.

Status post cholecystectomy. No intrahepatic ductal dilatation.
Dilated common duct, measuring 11 mm, but smoothly tapering at the
ampulla.

Pancreas: Within normal limits.

Spleen: 5 mm probable cyst in the central spleen.

Adrenals/Urinary Tract: Adrenal glands are within normal limits.

Kidneys are within normal limits.  No hydronephrosis.

Bladder is unremarkable.

Stomach/Bowel: Stomach is notable for a small hiatal hernia.

No evidence of bowel obstruction.

Normal appendix (series 2/image 59).

Left colon is mildly thick-walled/underdistended.

Vascular/Lymphatic: Atherosclerotic calcifications of the abdominal
aorta and branch vessels. No evidence of abdominal aortic aneurysm.

No suspicious abdominopelvic lymphadenopathy.

Reproductive: Status post hysterectomy.

No adnexal masses.

Other: Small to moderate abdominopelvic ascites, mildly increased.

Associated peritoneal nodularity/ omental caking (series 2/ image
37), minimally increased from recent CT.

Additional mild pelvic nodularity/ thickening along the dependent
pelvis (series 2/image 72), including a discrete 10 mm nodule
(series 2/image 66), new/increased.

Small fat containing bilateral inguinal hernias, left greater than
right.

Musculoskeletal: Mild degenerative changes the lower thoracic spine.
IMPRESSION: Status post hysterectomy.

Small to moderate abdominopelvic ascites, mildly increased.

Associated peritoneal nodularity/ omental caking, mildly progressed.

Additional ancillary findings as above.

## 2016-07-13 MED ORDER — MEGESTROL ACETATE 40 MG PO TABS: 80.0000 mg | ORAL_TABLET | Freq: Two times a day (BID) | ORAL | 1 refills | Status: AC

## 2016-07-13 MED ORDER — LORAZEPAM 0.5 MG PO TABS: ORAL_TABLET | ORAL | 0 refills | Status: AC

## 2016-07-13 NOTE — Progress Notes (Signed)
OFFICE PROGRESS NOTE   July 13, 2016   Physicians: W.Brewster/ Emma Rossi,Plotnikov, [name], MD, Gery Pray, [facility] [name]), [name]  INTERVAL HISTORY:  Patient is seen, together with husband, in continuing attention to progressive high grade endometrial carcinoma. She had repeat CT AP on 07-10-16, as we are considering options for further treatment given recurrent disease refractory to taxol carboplatin and no response to doxil used thru 06-05-16.  She saw Dr Skeet Latch on 06-15-16, consideration of additional treatment with temsirolimus and metformin.   Last paracentesis was for 1.6 liters in 09-2015.   CT AP 07-10-16 shows partial mid SBO, progression including increased omental caking and some ascites. She has more abdominal fullness and bloating, has not had bowel movement in 2 days, is intentionally taking more po including 1-2 supplements daily, no vomiting. She ate hot dogs yesterday. She does not have frank abdominal pain. She is not using laxatives regularly. She denies fever, SOB, LE swelling, new or different pain. She is fatigued. No problems with PAC Remainder of 10 point Review of Systems negative.   PAC flushed 07-03-16 No genetics testing. MMR normal on surgical path 04-2015  CA 125 09-14-15 2427. CA 125 on 03-20-16 was 713, as baseline for doxil (began 04-10-16) ER PR  negative surgical path 04-20-15  ([id number]) High dose flu vaccine done 06-23-16     ONCOLOGIC HISTORY Patient had regular medical care by PCP Dr Alain Marion and gyn Dr Garwin Brothers. In spring 2016, she noticed low pelvic discomfort and some intermittent vaginal spotting. She was seen for regular visit by Dr Garwin Brothers in May 2016, with PAP positive for AGUS and US showing endometrial stripe 8.5m. Endometrial biopsy 03-12-15 showed high grade adenocarcinoma with papillary features, however immunostains were not typical for serous carcinoma. She was seen by Dr RDenman Georgein June. CT CAP 04-14-15  showed no evidence of metastatic disease in chest, a 3.3 x 2.8 cm endometrial mas and 3.1 cm cystic lesion of right ovary. She had robotic total laparoscopic hysterectomy BSO with bilateral pelvic and para aortic node [facility] Pathology [phone] grade 2 endometrioid adenocarcinoma invading 0.35 cm where myometrium 1.1 cm, 26 nodes negative and no LVSI. She had intracavitary radiation due to risk for vaginal cuff recurrence from 06-01-15 thru 07-01-15, vaginal cuff 27.5 gray in 5 fractions. There were no obvious concerns when she was seen by Dr RDenman Georgein late July or Dr KSondra Comeand Dr PAlain Marionin October. She presented to ED on 09-06-15 with generalized abdominal pain, bloating and constipation x 1 month. CT AP 09-06-15 showed extensive nodularity and stranding thruout omentum and large volume ascites, with liver/spleen/pancreas/adrenals not remarkable, no bowel obstruction. CA 125 was 2427. She had [place]oreaparacentesi on 09-07-15 for 5 liters of serous fluid, cytology (([id number] adenocarcinoma. She had consultation with Dr RDenman Georgeon 09-13-15, recommendation for systemic chemotherapy. Review of cytology from ascites compared with surgical specimen and [facility] high grade features that were seen on the initial endometrial biopsy (not seen on the full surgical path); ER negative. She had repeat UKoreaparacentesis for 5.2 liters on 09-20-15. She had first carboplatin taxol on 09-21-15 and was neutropenic by day 8 cycle 1, granix begun. She had cycle 2 at same doses (carbo AUC = 3.5) 0n 10-13-15 with neulasta, again neutropenic such that doses were adjusted subsequently. CT AP 01-12-16 after cycle 6 showed improvement, tho still evidence of disease; CA 125 was  683 with cycle 6 chemo. She received additional 3 cycles thru cycle 9 on 03-07-16, with progressive peripheral neuropathy despite decrease in taxol dose, and increase in  CA 125 to 713 on 03-20-16. CT AP 03-31-16 confirmed progression of omental and peritoneal involvement and some increase in ascites. She began doxil on 04-10-16, no response after 3 cycles thru 06-05-16. CT AP 07-10-16 showed partial mid SBO, progression including increased omental caking and some ascites. .  Objective:  Vital signs in last 24 hours:  BP (!) 154/85 (BP Location: Right Arm, Patient Position: Sitting)   Pulse 91   Temp 98.2 F (36.8 C) (Oral)   Resp 18   Ht 5\' 6"  (1.676 m)   Wt 159 lb 3.2 oz (72.2 kg)   SpO2 100%   BMI 25.70 kg/m  Weight stable. Alert, oriented and appropriate. Ambulatory without assistance.  Wearing wig  HEENT:PERRL, sclerae not icteric. Oral mucosa moist without lesions . No JVD.  Lymphatics:no supraclavicular adenopathy Resp: clear to auscultation bilaterally  Cardio: regular rate and rhythm. No gallop. GI: soft, nontender, more distended tho not quite tight, no mass or organomegaly. Some normal bowel sounds. Surgical incision not remarkable. Musculoskeletal/ Extremities: LE without pitting edema, cords, tenderness Neuro: peripheral neuropathy unchanged. Otherwise nonfocal. PSYCH appropriate mood and affect Skin without rash, ecchymosis, petechiae Portacath-without erythema or tenderness  Lab Results:  Results for orders placed or performed in visit on 07/03/16  CBC with Differential  Result Value Ref Range   WBC 3.4 (L) 3.9 - 10.3 10e3/uL   NEUT# 1.9 1.5 - 6.5 10e3/uL   HGB 10.8 (L) 11.6 - 15.9 g/dL   HCT 07/05/16 (L) 51.4 - 58.9 %   Platelets 185 145 - 400 10e3/uL   MCV 92.1 79.5 - 101.0 fL   MCH 30.6 25.1 - 34.0 pg   MCHC 33.2 31.5 - 36.0 g/dL   RBC 35.8 (L) 2.51 - 4.99 10e6/uL   RDW 14.2 11.2 - 14.5 %   lymph# 0.8 (L) 0.9 - 3.3 10e3/uL   MONO# 0.7 0.1 - 0.9 10e3/uL   Eosinophils Absolute 0.0 0.0 - 0.5 10e3/uL   Basophils Absolute 0.0 0.0 - 0.1 10e3/uL   NEUT% 55.8 38.4 - 76.8 %   LYMPH% 23.7 14.0 - 49.7 %   MONO% 19.6 (H) 0.0 - 14.0 %    EOS% 0.6 0.0 - 7.0 %   BASO% 0.3 0.0 - 2.0 %  Comprehensive metabolic panel  Result Value Ref Range   Sodium 140 136 - 145 mEq/L   Potassium 3.6 3.5 - 5.1 mEq/L   Chloride 105 98 - 109 mEq/L   CO2 26 22 - 29 mEq/L   Glucose 117 70 - 140 mg/dl   BUN 4.30 7.0 - 35.5 mg/dL   Creatinine 0.8 0.6 - 1.1 mg/dL   Total Bilirubin 84.4 0.20 - 1.20 mg/dL   Alkaline Phosphatase 59 40 - 150 U/L   AST 35 (H) 5 - 34 U/L   ALT 18 0 - 55 U/L   Total Protein 7.3 6.4 - 8.3 g/dL   Albumin 3.1 (L) 3.5 - 5.0 g/dL   Calcium 9.3 8.4 - 1.68 mg/dL   Anion Gap 9 3 - 11 mEq/L   EGFR 81 (L) >90 ml/min/1.73 m2     Studies/Results:  CT ABDOMEN AND PELVIS WITH CONTRAST 07-10-16  COMPARISON:  03/31/2016  FINDINGS: Lower chest: Clear lung bases. Borderline cardiomegaly, without pericardial or pleural effusion. Small hiatal hernia.  Hepatobiliary: Normal liver. The common duct is upper  normal, including at 10 mm. No intrahepatic duct dilatation. No choledocholithiasis.  Pancreas: Pancreatic atrophy, without pancreatic duct dilatation.  Spleen: Subcentimeter low-density splenic lesion is unchanged and of doubtful significance.  Adrenals/Urinary Tract: Normal adrenal glands. An upper pole left renal 6 mm cyst. Normal right kidney, without hydronephrosis. Normal urinary bladder.  Stomach/Bowel: Proximal gastric underdistention. Normal colon, appendix, and terminal ileum. Mid small bowel loops are mildly dilated, including at 3.3 cm on image 67/series 2. A transition is identified to decompressed bowel in the distal ileum of the pelvis, including on image 75/series 2. The proximal most small bowel loops are normal in caliber.  Vascular/Lymphatic: Aortic and branch vessel atherosclerosis. No abdominopelvic adenopathy.  Reproductive: Hysterectomy.  No adnexal mass.  Other: Increase in moderate volume abdominal pelvic ascites. Re- demonstration of omental caking. This is subjectively  increased, but difficult to quantify secondary to its diffuse relatively ill-defined morphology. Example omental thickening in the right-side of the abdomen at 10 mm on image 44/series 2 versus on the order of 5 mm previously (when remeasured).  Suspect progressive peritoneal disease in the porta hepatis, including the gastrohepatic ligament on image 22/series 2.  Right pelvic index implant measures 11 mm on image 71/series 2 and is similar at 10 mm previously. Cul-de-sac thickening and nodularity including on image 76/ series 2 persists. This is site of the small bowel transition.  Fat containing left inguinal hernia.  Musculoskeletal: Disc bulge at L2-3.  IMPRESSION: 1. Mildly progressive disease. Increased moderate abdominal pelvic ascites with increase in omental/peritoneal metastasis. 2. Low-grade partial small bowel obstruction secondary to peritoneal metastasis in the pelvic cul-de-sac. Transition in the distal ileum with mid small bowel mild dilatation. 3. Small hiatal hernia. 4.  Aortic atherosclerosis.  PACs images reviewed by MD   Medications: I have reviewed the patient's current medications. She is to take miralax on returning home today, repeat at hs if bowels have not moved, then 1-2x daily to keep bowels moving daily.  Begin Megace 80 mg bid  DISCUSSION Findings of CT reviewed; patient and husband did not want to see PACs images.   Discussed low residue diet, written information given. Explained that liquids including supplements, and soft foods are usually better tolerated when concern for bowel obstruction. Explained that the partial small bowel obstruction is very likely result of progressive cancer that we can identify on this CT. I have told them that surgical intervention for bowel obstruction in this situation is generally not done unless significant symptoms, may require colostomy, is a temporizing but not curative intervention and would not surgically  remove all of the recurrent cancer. I have told her that she should let us know if severe abdominal pain or recurrent vomiting.   She may get symptomatic relief with US paracentesis, which she would like to try; I have spoken with radiology scheduling and set this up for next available = 07-18-16.  We have talked at length about trying further systemic treatment in attempt to improve or temporarily control this progressive cancer, vs comfort care only. Particularly we have discussed possible use of temsirolimus + metformin, the temsirolimus  IV weekly, with possible significant side effects as with all of these drugs. I have mentioned megace ( tho hormone receptor negative) as a gentler intervention that might also improve appetite. I have told her that she certainly can decide not to do further treatment if she prefers.   I have talked with patient and husband together about Advance Directives and her wishes as to  life support and resuscitation. Patient tells me that her " husband will have to decide", but they did allow me to explain that resuscitation and mechanical life support would not improve the underlying problem, which is advanced and incurable malignancy. I have also explained that time on mechanical life support is not quality time to spend with family, and I have told them that any interventions that might help her could still be done even if she has DNR status. They seemed to understand this conversation, tho patient did not express her wishes further. They both asked if "this is the end" and patient expressed her strong faith regardless of outcome.   Assessment/Plan:  1. 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 Carbo taxol x 9 cycles from 09-21-15 thru 03-07-16, then doxil x 3 from 04-10-16 thru 06-05-16. CA 125 has been marker, no improvement on doxil. CT AP  07-10-16 progression + mid small bowel partial obstruction. She understands that likelihood of response to further treatment is low and she prefers to try megace instead of temsirolimus + metformin. Diet recommendations with the partial bowel obstrucion as above. Paracentesis for comfort if possible.  She has follow up with Dr Skeet Latch on  08-03-16.  2. Partial mid SBO by CT: as above. 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 Managed by Dr Alain Marion 8.post cholecystectomy and up to date colonoscopy and mammograms 9..squamous cell carcinoma anus 2003: radiation and (?)chemo then, may be impacting counts with recent chemo. 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 Slight improvement since off of taxol. Follow. 11.Discussion about Advance Directives today, needs follow up.  All questions answered and patient knows to call if needed prior to next scheduled visit. Time spent 30 min including >50% counseling and coordination of care. Route Dr Alain Marion, cc Dr Skeet Latch    Evlyn Clines, MD   07/13/2016, 8:27 PM

## 2016-07-15 DIAGNOSIS — K56600 Partial intestinal obstruction, unspecified as to cause: Secondary | ICD-10-CM | POA: Insufficient documentation

## 2016-07-17 ENCOUNTER — Emergency Department (HOSPITAL_COMMUNITY): Payer: Commercial Managed Care - HMO

## 2016-07-17 ENCOUNTER — Observation Stay (HOSPITAL_COMMUNITY): Payer: Commercial Managed Care - HMO

## 2016-07-17 ENCOUNTER — Inpatient Hospital Stay (HOSPITAL_COMMUNITY)
Admission: EM | Admit: 2016-07-17 | Discharge: 2016-07-19 | DRG: 754 | Disposition: A | Discharge status: Home / Self Care | Payer: Commercial Managed Care - HMO | Attending: Internal Medicine | Admitting: Internal Medicine

## 2016-07-17 ENCOUNTER — Encounter (HOSPITAL_COMMUNITY): Payer: Self-pay | Admitting: Emergency Medicine

## 2016-07-17 DIAGNOSIS — C786 Secondary malignant neoplasm of retroperitoneum and peritoneum: Secondary | ICD-10-CM | POA: Diagnosis present

## 2016-07-17 DIAGNOSIS — Z923 Personal history of irradiation: Secondary | ICD-10-CM

## 2016-07-17 DIAGNOSIS — M810 Age-related osteoporosis without current pathological fracture: Secondary | ICD-10-CM | POA: Diagnosis present

## 2016-07-17 DIAGNOSIS — Z803 Family history of malignant neoplasm of breast: Secondary | ICD-10-CM

## 2016-07-17 DIAGNOSIS — R140 Abdominal distension (gaseous): Secondary | ICD-10-CM | POA: Diagnosis not present

## 2016-07-17 DIAGNOSIS — C541 Malignant neoplasm of endometrium: Principal | ICD-10-CM | POA: Diagnosis present

## 2016-07-17 DIAGNOSIS — K56609 Unspecified intestinal obstruction, unspecified as to partial versus complete obstruction: Secondary | ICD-10-CM | POA: Diagnosis not present

## 2016-07-17 DIAGNOSIS — K56600 Partial intestinal obstruction, unspecified as to cause: Secondary | ICD-10-CM

## 2016-07-17 DIAGNOSIS — Z87891 Personal history of nicotine dependence: Secondary | ICD-10-CM

## 2016-07-17 DIAGNOSIS — D638 Anemia in other chronic diseases classified elsewhere: Secondary | ICD-10-CM | POA: Diagnosis present

## 2016-07-17 DIAGNOSIS — E785 Hyperlipidemia, unspecified: Secondary | ICD-10-CM | POA: Diagnosis present

## 2016-07-17 DIAGNOSIS — K219 Gastro-esophageal reflux disease without esophagitis: Secondary | ICD-10-CM | POA: Diagnosis present

## 2016-07-17 DIAGNOSIS — R109 Unspecified abdominal pain: Secondary | ICD-10-CM | POA: Diagnosis not present

## 2016-07-17 DIAGNOSIS — C799 Secondary malignant neoplasm of unspecified site: Secondary | ICD-10-CM

## 2016-07-17 DIAGNOSIS — K739 Chronic hepatitis, unspecified: Secondary | ICD-10-CM | POA: Diagnosis present

## 2016-07-17 DIAGNOSIS — C801 Malignant (primary) neoplasm, unspecified: Secondary | ICD-10-CM

## 2016-07-17 DIAGNOSIS — R188 Other ascites: Secondary | ICD-10-CM

## 2016-07-17 DIAGNOSIS — R1110 Vomiting, unspecified: Secondary | ICD-10-CM

## 2016-07-17 DIAGNOSIS — K5900 Constipation, unspecified: Secondary | ICD-10-CM | POA: Diagnosis present

## 2016-07-17 DIAGNOSIS — E860 Dehydration: Secondary | ICD-10-CM | POA: Diagnosis present

## 2016-07-17 DIAGNOSIS — N179 Acute kidney failure, unspecified: Secondary | ICD-10-CM | POA: Diagnosis not present

## 2016-07-17 DIAGNOSIS — Z823 Family history of stroke: Secondary | ICD-10-CM

## 2016-07-17 DIAGNOSIS — I10 Essential (primary) hypertension: Secondary | ICD-10-CM | POA: Diagnosis present

## 2016-07-17 DIAGNOSIS — Z6823 Body mass index (BMI) 23.0-23.9, adult: Secondary | ICD-10-CM

## 2016-07-17 DIAGNOSIS — E46 Unspecified protein-calorie malnutrition: Secondary | ICD-10-CM | POA: Diagnosis present

## 2016-07-17 DIAGNOSIS — Z79899 Other long term (current) drug therapy: Secondary | ICD-10-CM

## 2016-07-17 DIAGNOSIS — C55 Malignant neoplasm of uterus, part unspecified: Secondary | ICD-10-CM | POA: Diagnosis present

## 2016-07-17 DIAGNOSIS — E739 Lactose intolerance, unspecified: Secondary | ICD-10-CM | POA: Diagnosis present

## 2016-07-17 DIAGNOSIS — Z66 Do not resuscitate: Secondary | ICD-10-CM | POA: Diagnosis present

## 2016-07-17 DIAGNOSIS — Z806 Family history of leukemia: Secondary | ICD-10-CM

## 2016-07-17 DIAGNOSIS — F411 Generalized anxiety disorder: Secondary | ICD-10-CM | POA: Diagnosis present

## 2016-07-17 DIAGNOSIS — E43 Unspecified severe protein-calorie malnutrition: Secondary | ICD-10-CM | POA: Diagnosis present

## 2016-07-17 DIAGNOSIS — Z85048 Personal history of other malignant neoplasm of rectum, rectosigmoid junction, and anus: Secondary | ICD-10-CM | POA: Diagnosis present

## 2016-07-17 LAB — COMPREHENSIVE METABOLIC PANEL
ALBUMIN: 3.2 g/dL — AB (ref 3.5–5.0)
ALK PHOS: 42 U/L (ref 38–126)
ALT: 12 U/L — ABNORMAL LOW (ref 14–54)
ANION GAP: 9 (ref 5–15)
AST: 27 U/L (ref 15–41)
BILIRUBIN TOTAL: 0.8 mg/dL (ref 0.3–1.2)
BUN: 15 mg/dL (ref 6–20)
CALCIUM: 9 mg/dL (ref 8.9–10.3)
CO2: 24 mmol/L (ref 22–32)
Chloride: 99 mmol/L — ABNORMAL LOW (ref 101–111)
Creatinine, Ser: 1.03 mg/dL — ABNORMAL HIGH (ref 0.44–1.00)
GFR, EST NON AFRICAN AMERICAN: 52 mL/min — AB (ref >60)
Glucose, Bld: 146 mg/dL — ABNORMAL HIGH (ref 65–99)
POTASSIUM: 3.6 mmol/L (ref 3.5–5.1)
SODIUM: 132 mmol/L — AB (ref 135–145)
TOTAL PROTEIN: 7.2 g/dL (ref 6.5–8.1)

## 2016-07-17 LAB — CBC
HEMATOCRIT: 37.7 % (ref 36.0–46.0)
Hemoglobin: 12.4 g/dL (ref 12.0–15.0)
MCH: 30 pg (ref 26.0–34.0)
MCHC: 32.9 g/dL (ref 30.0–36.0)
MCV: 91.3 fL (ref 78.0–100.0)
Platelets: 279 10*3/uL (ref 150–400)
RBC: 4.13 MIL/uL (ref 3.87–5.11)
RDW: 14.2 % (ref 11.5–15.5)
WBC: 5.9 10*3/uL (ref 4.0–10.5)

## 2016-07-17 MED ORDER — ONDANSETRON HCL 4 MG PO TABS
4.0000 mg | ORAL_TABLET | Freq: Four times a day (QID) | ORAL | Status: DC | PRN
  Administered 2016-07-17: 4 mg via ORAL
  Filled 2016-07-17: qty 1

## 2016-07-17 MED ORDER — ENOXAPARIN SODIUM 40 MG/0.4ML ~~LOC~~ SOLN
40.0000 mg | SUBCUTANEOUS | Status: DC
  Filled 2016-07-17: qty 0.4

## 2016-07-17 MED ORDER — FERROUS FUMARATE 324 (106 FE) MG PO TABS
1.0000 | ORAL_TABLET | ORAL | Status: DC
  Administered 2016-07-17 – 2016-07-19 (×2): 106 mg via ORAL
  Filled 2016-07-17 (×2): qty 1

## 2016-07-17 MED ORDER — ENSURE ENLIVE PO LIQD
237.0000 mL | Freq: Two times a day (BID) | ORAL | Status: DC
  Administered 2016-07-17 – 2016-07-18 (×2): 237 mL via ORAL

## 2016-07-17 MED ORDER — POLYETHYLENE GLYCOL 3350 17 G PO PACK: 17.0000 g | PACK | Freq: Every day | ORAL | Status: DC | PRN

## 2016-07-17 MED ORDER — ONDANSETRON HCL 4 MG/2ML IJ SOLN: 4.0000 mg | Freq: Three times a day (TID) | INTRAMUSCULAR | Status: DC | PRN

## 2016-07-17 MED ORDER — PANTOPRAZOLE SODIUM 40 MG PO TBEC
40.0000 mg | DELAYED_RELEASE_TABLET | Freq: Every day | ORAL | Status: DC
  Administered 2016-07-18 – 2016-07-19 (×2): 40 mg via ORAL
  Filled 2016-07-17 (×2): qty 1

## 2016-07-17 MED ORDER — ATENOLOL 50 MG PO TABS
75.0000 mg | ORAL_TABLET | Freq: Every day | ORAL | Status: DC
  Administered 2016-07-18 – 2016-07-19 (×2): 75 mg via ORAL
  Filled 2016-07-17 (×3): qty 2

## 2016-07-17 MED ORDER — MORPHINE SULFATE (PF) 2 MG/ML IV SOLN
2.0000 mg | INTRAVENOUS | Status: DC | PRN
  Administered 2016-07-19: 2 mg via INTRAVENOUS
  Filled 2016-07-17: qty 1

## 2016-07-17 MED ORDER — ENOXAPARIN SODIUM 40 MG/0.4ML ~~LOC~~ SOLN
40.0000 mg | SUBCUTANEOUS | Status: DC
  Administered 2016-07-18 – 2016-07-19 (×2): 40 mg via SUBCUTANEOUS
  Filled 2016-07-17 (×2): qty 0.4

## 2016-07-17 MED ORDER — ONDANSETRON HCL 4 MG/2ML IJ SOLN
4.0000 mg | Freq: Four times a day (QID) | INTRAMUSCULAR | Status: DC | PRN
  Administered 2016-07-19: 4 mg via INTRAVENOUS
  Filled 2016-07-17: qty 2

## 2016-07-17 MED ORDER — LORAZEPAM 0.5 MG PO TABS
0.5000 mg | ORAL_TABLET | Freq: Four times a day (QID) | ORAL | Status: DC | PRN
  Administered 2016-07-18: 0.5 mg via ORAL
  Filled 2016-07-17: qty 1

## 2016-07-17 MED ORDER — ALUM & MAG HYDROXIDE-SIMETH 200-200-20 MG/5ML PO SUSP
15.0000 mL | Freq: Once | ORAL | Status: AC
  Administered 2016-07-17: 15 mL via ORAL
  Filled 2016-07-17: qty 30

## 2016-07-17 MED ORDER — CENTRUM SILVER PO TABS: 1.0000 | ORAL_TABLET | Freq: Every day | ORAL | Status: DC

## 2016-07-17 MED ORDER — ADULT MULTIVITAMIN W/MINERALS CH
1.0000 | ORAL_TABLET | Freq: Every day | ORAL | Status: DC
  Administered 2016-07-18 – 2016-07-19 (×2): 1 via ORAL
  Filled 2016-07-17 (×3): qty 1

## 2016-07-17 MED ORDER — SODIUM CHLORIDE 0.9 % IV SOLN
INTRAVENOUS | Status: DC
Start: 2016-07-17 — End: 2016-07-19
  Administered 2016-07-18 – 2016-07-19 (×2): via INTRAVENOUS

## 2016-07-17 MED ORDER — MEGESTROL ACETATE 40 MG PO TABS
80.0000 mg | ORAL_TABLET | Freq: Two times a day (BID) | ORAL | Status: DC
  Administered 2016-07-18 – 2016-07-19 (×3): 80 mg via ORAL
  Filled 2016-07-17 (×6): qty 2

## 2016-07-17 MED ORDER — SODIUM CHLORIDE 0.9 % IV BOLUS (SEPSIS)
1000.0000 mL | Freq: Once | INTRAVENOUS | Status: AC
  Administered 2016-07-17: 1000 mL via INTRAVENOUS

## 2016-07-17 MED ORDER — ONDANSETRON HCL 4 MG/2ML IJ SOLN
4.0000 mg | Freq: Once | INTRAMUSCULAR | Status: AC
  Administered 2016-07-17: 4 mg via INTRAVENOUS
  Filled 2016-07-17: qty 2

## 2016-07-17 MED ORDER — SODIUM CHLORIDE 0.9 % IV SOLN
INTRAVENOUS | Status: DC
  Administered 2016-07-17: 15:00:00 via INTRAVENOUS

## 2016-07-17 NOTE — ED Provider Notes (Addendum)
[facility] DEPT Provider Note   CSN: [id number] Arrival date & time: 07/17/16  1136     History   Chief Complaint Chief Complaint  Patient presents with  . Abdominal Pain    HPI Sharon Holt is a 75 y.o. female.  Patient with hx metastatic endometrial cancer, presents with abdominal pain, distension, getting worse in the past couple weeks.  Despite tx, has had progression of her disease.  Had recent CT scan which showed a partial sbo, and ascites. Is due to have paracentesis tomorrow. Patient c/o nausea and vomiting. Denies bloody or bilious emesis. Had bm today, small. Denies fever or chills.  No current iv chemotherapy, states she takes pills. Patient states is taking her normal meds at home.    The history is provided by the patient.  Abdominal Pain   Associated symptoms include nausea and vomiting. Pertinent negatives include fever, dysuria and headaches.    Past Medical History:  Diagnosis Date  . Anemia, unspecified   . Anxiety state, unspecified   . Chronic hepatitis, unspecified    pt thinks type B  . Endometrial cancer ([place]) 04/2015   serous adenocarcinoma  . Endometrial cancer, FIGO stage IVB ([place]) 07/05/2016  . History of brachytherapy 06/01/15, 06/10/15, 06/17/15, 06/24/15, 07/01/15   vaginal cuff 27.5 gray  . Malignant neoplasm of anal canal ([place]) 15 yrs ago   treated with radiation   . Mitral valve disorders(424.0)   . Osteoporosis   . Other abnormal glucose   . Other and unspecified hyperlipidemia   . Transfusion history    "back in my 20's"  . Unspecified essential hypertension     Patient Active Problem List   Diagnosis Date Noted  . Partial small bowel obstruction 07/15/2016  . Endometrial cancer, FIGO stage IVB ([place]) 07/05/2016  . Abdominal pain, epigastric 06/29/2016  . Dysphagia 06/23/2016  . High risk medication use 05/26/2016  . Port catheter in place 04/10/2016  . 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 Iron deficiency  anemia due to chronic blood loss 11/13/2015  . Iron deficiency anemia 11/03/2015  . Anemia due to antineoplastic chemotherapy 10/01/2015  . Neutropenia, drug-induced ([place]) 10/01/2015  . Chemotherapy induced neutropenia ([place])   . Tachycardia 09/28/2015  . Hyperkalemia 09/28/2015  . Protein calorie malnutrition ([place]) 09/27/2015  . Leukopenia due to antineoplastic chemotherapy ([place]) 09/27/2015  . Sinus tachycardia 09/27/2015  . GERD (gastroesophageal reflux disease) 09/17/2015  . Long term current use of systemic steroids 09/14/2015  . CKD (chronic kidney disease) stage 3, GFR 30-59 ml/min 09/14/2015  . History of rectal or anal cancer 09/14/2015  . Malignant ascites 09/14/2015  . Malignant neoplasm of endometrium ([place]) 09/14/2015  . Acute kidney injury ([place]) 09/07/2015  . Peritoneal carcinomatosis ([place]) 09/07/2015  . Abdominal distension 09/06/2015  . Abdominal pain 09/06/2015  . Metastatic disease ([place]) 09/06/2015  . Ascites 09/06/2015  . Bloating 09/02/2015  . Constipation 08/26/2015  . Postcholecystectomy diarrhea 08/09/2015  . Endometrial cancer ([place]) 04/20/2015  . Uterine cancer ([place]) 04/02/2015  . Well adult exam 09/25/2014  . Laceration of wrist, left 03/22/2014  . Grief 03/20/2014  . Thyroid nodule 02/13/2012  . Malignant neoplasm of anal canal ([place]) 02/13/2012  . Special screening for malignant neoplasms, colon 07/17/2011  . Diverticulosis of large intestine 07/17/2011  . Anemia 12/31/2008  . Dyslipidemia 12/30/2007  . UNSPECIFIED CHRONIC HEPATITIS 12/30/2007  . OSTEOPENIA 12/30/2007  . OTHER ABNORMAL GLUCOSE 12/30/2007  . ANXIETY 10/23/2007  . Essential hypertension 10/23/2007  . MITRAL VALVE PROLAPSE  10/23/2007  . PALPITATIONS 10/23/2007    Past Surgical History:  Procedure Laterality Date  . BREAST LUMPECTOMY     right breast BENIGN  . CHOLECYSTECTOMY    . ROBOTIC ASSISTED TOTAL HYSTERECTOMY WITH BILATERAL SALPINGO OOPHERECTOMY Bilateral 04/20/2015    Procedure: ROBOTIC ASSISTED TOTAL HYSTERECTOMY WITH BILATERAL SALPINGO OOPHORECTOMY AND PELVIC AND PERIAORTIC LYMPHADECTOMY;  Surgeon: Janie Morning, MD;  Location: [facility];  Service: Gynecology;  Laterality: Bilateral;  . TONSILLECTOMY      OB History    No data available       Home Medications    Prior to Admission medications   Medication Sig Start Date End Date Taking? Authorizing Provider  atenolol (TENORMIN) 25 MG tablet take 3 tablets by mouth once daily 06/27/16   Cassandria Anger, MD  Cholecalciferol (VITAMIN D3) 2000 UNITS capsule Take 2,000 Units by mouth daily.     Historical Provider, MD  clorazepate (TRANXENE) 7.5 MG tablet Take 1 tablet (7.5 mg total) by mouth 2 (two) times daily as needed for anxiety. 02/08/16   [name] Plotnikov, MD  feeding supplement, ENSURE ENLIVE, (ENSURE ENLIVE) LIQD Take 237 mLs by mouth 2 (two) times daily between meals. 09/29/15   Barton Dubois, MD  FERROCITE 324 MG TABS tablet Take 1 tablet (106 mg of iron total) by mouth daily. 04/12/16   Lennis Marion Downer, MD  lidocaine-prilocaine (EMLA) cream Apply 1-2 hours prior to [facility] Access as needed. 04/03/16   Lennis Marion Downer, MD  LORazepam (ATIVAN) 0.5 MG tablet Place 1 tablet under the tongue or swallow every 6 hours as needed for nausea. 07/13/16   Lennis Marion Downer, MD  megestrol (MEGACE) 40 MG tablet Take 2 tablets (80 mg total) by mouth 2 (two) times daily. 07/13/16   Lennis Marion Downer, MD  Multiple Vitamins-Minerals (CENTRUM SILVER PO) Take 1 capsule by mouth daily.     Historical Provider, MD  ondansetron (ZOFRAN) 8 MG tablet Take 0.5-1 tablets (4-8 mg total) by mouth every 8 (eight) hours as needed for nausea, vomiting or refractory nausea / vomiting. Will NOT make drowsy. Patient not taking: Reported on 07/13/2016 09/17/15   [name] Plotnikov, MD  polyethylene glycol (MIRALAX / GLYCOLAX) packet Take 17 g by mouth daily. Reported on 12/09/2015    Historical Provider, MD  potassium chloride SA  (K-DUR,KLOR-CON) 20 MEQ tablet Take 1 tablet (20 mEq total) by mouth daily. 11/09/15   [name] Plotnikov, MD  RABEprazole (ACIPHEX) 20 MG tablet Take 1 tablet (20 mg total) by mouth 2 (two) times daily. 06/23/16   [name] Plotnikov, MD  vitamin C (ASCORBIC ACID) 500 MG tablet Take 500 mg by mouth daily.    Historical Provider, MD    Family History Family History  Problem Relation Age of Onset  . Prostate cancer Brother     dx. 24s  . Stroke Father   . Breast cancer Sister     dx. 64-65  . Dementia Brother   . Breast cancer Sister 96    mastectomy  . Leukemia Sister     dx. 51s-70s  . Colon cancer Neg Hx     Social History Social History  Substance Use Topics  . Smoking status: Former Research scientist (life sciences)  . Smokeless tobacco: Never Used  . Alcohol use No     Allergies   Review of patient's allergies indicates no known allergies.   Review of Systems Review of Systems  Constitutional: Negative for fever.  HENT: Negative for sore throat.  Eyes: Negative for redness.  Respiratory: Negative for shortness of breath.   Cardiovascular: Negative for chest pain.  Gastrointestinal: Positive for abdominal pain, nausea and vomiting.  Genitourinary: Negative for dysuria and flank pain.  Musculoskeletal: Negative for back pain and neck pain.  Skin: Negative for rash.  Neurological: Negative for headaches.  Hematological: Does not bruise/bleed easily.  Psychiatric/Behavioral: Negative for confusion.     Physical Exam Updated Vital Signs BP 137/99 (BP Location: Left Arm)   Pulse 115   Temp 98.1 F (36.7 C) (Oral)   Resp 18   Ht 5\' 6"  (1.676 m)   Wt 72.1 kg   SpO2 97%   BMI 25.66 kg/m   Physical Exam  Constitutional: She appears well-developed and well-nourished. No distress.  HENT:  Mouth/Throat: Oropharynx is clear and moist.  Eyes: Conjunctivae are normal. No scleral icterus.  Neck: Neck supple. No tracheal deviation present.  Cardiovascular: Normal rate, regular rhythm,  normal heart sounds and intact distal pulses.   Pulmonary/Chest: Effort normal and breath sounds normal. No respiratory distress.  Abdominal: Soft. Normal appearance. She exhibits distension. There is no rebound and no guarding.  +ascites  Genitourinary:  Genitourinary Comments: No cva tenderness  Musculoskeletal: She exhibits no edema.  Neurological: She is alert.  Skin: Skin is warm and dry. No rash noted. She is not diaphoretic.  Psychiatric: She has a normal mood and affect.  Nursing note and vitals reviewed.    ED Treatments / Results  Labs (all labs ordered are listed, but only abnormal results are displayed) Results for orders placed or performed during the hospital encounter of 07/17/16  CBC  Result Value Ref Range   WBC 5.9 4.0 - 10.5 K/uL   RBC 4.13 3.87 - 5.11 MIL/uL   Hemoglobin 12.4 12.0 - 15.0 g/dL   HCT 37.7 36.0 - 46.0 %   MCV 91.3 78.0 - 100.0 fL   MCH 30.0 26.0 - 34.0 pg   MCHC 32.9 30.0 - 36.0 g/dL   RDW 14.2 11.5 - 15.5 %   Platelets 279 150 - 400 K/uL  Comprehensive metabolic panel  Result Value Ref Range   Sodium 132 (L) 135 - 145 mmol/L   Potassium 3.6 3.5 - 5.1 mmol/L   Chloride 99 (L) 101 - 111 mmol/L   CO2 24 22 - 32 mmol/L   Glucose, Bld 146 (H) 65 - 99 mg/dL   BUN 15 6 - 20 mg/dL   Creatinine, Ser 1.03 (H) 0.44 - 1.00 mg/dL   Calcium 9.0 8.9 - 10.3 mg/dL   Total Protein 7.2 6.5 - 8.1 g/dL   Albumin 3.2 (L) 3.5 - 5.0 g/dL   AST 27 15 - 41 U/L   ALT 12 (L) 14 - 54 U/L   Alkaline Phosphatase 42 38 - 126 U/L   Total Bilirubin 0.8 0.3 - 1.2 mg/dL   GFR calc non Af Amer 52 (L) >60 mL/min   GFR calc Af Amer >60 >60 mL/min   Anion gap 9 5 - 15   Ct Abdomen Pelvis W Contrast  Result Date: 07/10/2016 CLINICAL DATA:  Uterine cancer.  Chemotherapy ongoing. EXAM: CT ABDOMEN AND PELVIS WITH CONTRAST TECHNIQUE: Multidetector CT imaging of the abdomen and pelvis was performed using the standard protocol following bolus administration of intravenous  contrast. CONTRAST:  135mL ISOVUE-300 IOPAMIDOL (ISOVUE-300) INJECTION 61% COMPARISON:  03/31/2016 FINDINGS: Lower chest: Clear lung bases. Borderline cardiomegaly, without pericardial or pleural effusion. Small hiatal hernia. Hepatobiliary: Normal liver. The common duct is upper  normal, including at 10 mm. No intrahepatic duct dilatation. No choledocholithiasis. Pancreas: Pancreatic atrophy, without pancreatic duct dilatation. Spleen: Subcentimeter low-density splenic lesion is unchanged and of doubtful significance. Adrenals/Urinary Tract: Normal adrenal glands. An upper pole left renal 6 mm cyst. Normal right kidney, without hydronephrosis. Normal urinary bladder. Stomach/Bowel: Proximal gastric underdistention. Normal colon, appendix, and terminal ileum. Mid small bowel loops are mildly dilated, including at 3.3 cm on image 67/series 2. A transition is identified to decompressed bowel in the distal ileum of the pelvis, including on image 75/series 2. The proximal most small bowel loops are normal in caliber. Vascular/Lymphatic: Aortic and branch vessel atherosclerosis. No abdominopelvic adenopathy. Reproductive: Hysterectomy.  No adnexal mass. Other: Increase in moderate volume abdominal pelvic ascites. Re- demonstration of omental caking. This is subjectively increased, but difficult to quantify secondary to its diffuse relatively ill-defined morphology. Example omental thickening in the right-side of the abdomen at 10 mm on image 44/series 2 versus on the order of 5 mm previously (when remeasured). Suspect progressive peritoneal disease in the porta hepatis, including the gastrohepatic ligament on image 22/series 2. Right pelvic index implant measures 11 mm on image 71/series 2 and is similar at 10 mm previously. Cul-de-sac thickening and nodularity including on image 76/ series 2 persists. This is site of the small bowel transition. Fat containing left inguinal hernia. Musculoskeletal: Disc bulge at L2-3.  IMPRESSION: 1. Mildly progressive disease. Increased moderate abdominal pelvic ascites with increase in omental/peritoneal metastasis. 2. Low-grade partial small bowel obstruction secondary to peritoneal metastasis in the pelvic cul-de-sac. Transition in the distal ileum with mid small bowel mild dilatation. 3. Small hiatal hernia. 4.  Aortic atherosclerosis. Electronically Signed   By: Abigail Miyamoto M.D.   On: 07/10/2016 15:00   Dg Abd Acute W/chest  Result Date: 07/17/2016 CLINICAL DATA:  75 year old female with abdominal pain and vomiting for 1 week. Metastatic endometrial cancer with ascites. Subsequent encounter. EXAM: DG ABDOMEN ACUTE W/ 1V CHEST COMPARISON:  Restaging CT Abdomen and Pelvis 07/10/2016, and earlier. FINDINGS: Right chest porta cath in place. Mediastinal contours are stable and within normal limits. Stable lung volumes. Mild eventration of the right hemidiaphragm. The lungs are clear. No pneumothorax or pneumoperitoneum. No pulmonary nodule identified. Small bowel obstruction gas pattern, significantly worse than on the recent CT Abdomen and Pelvis. Dilated small bowel loops up to 5 cm. Paucity of large bowel gas. Mild gray hazy opacity throughout the abdomen in keeping with the ascites which was demonstrated recently. Stable cholecystectomy clips. No acute osseous abnormality identified. IMPRESSION: 1. Progressive and now moderate to severe small bowel obstruction since the recent restaging CT Abdomen and Pelvis on 07/10/2016. Distal small bowel involvement by peritoneal metastases on that exam. No free air. 2.  No acute cardiopulmonary abnormality. Electronically Signed   By: Genevie Ann M.D.   On: 07/17/2016 13:00    EKG  EKG Interpretation None        Procedures Procedures (including critical care time)  Medications Ordered in ED Medications - No data to display   Initial Impression / Assessment and Plan / ED Course  I have reviewed the triage vital signs and the nursing  notes.  Pertinent labs & imaging results that were available during my care of the patient were reviewed by me and considered in my medical decision making (see chart for details).  Clinical Course   Iv ns bolus.   Labs. Xrays.   Reviewed nursing notes and prior charts for additional history.   Progression  of SBO on xrays.  Iv ns bolus.   NG.   General surgery consulted - they will see.   Medicine service to admit.     Final Clinical Impressions(s) / ED Diagnoses   Final diagnoses:  None    New Prescriptions New Prescriptions   No medications on file        Lajean Saver, MD 07/17/16 [phone]

## 2016-07-17 NOTE — H&P (Signed)
History and Physical  Sharon Holt [id number] DOB: 06/24/1941 DOA: 07/17/2016  Referring physician: Ashok Cordia, MD PCP: Walker Kehr, MD  Outpatient Specialists:  1. [facility] -- oncology  Chief Complaint: abdominal pain  HPI: Sharon Holt is a 75 y.o. female Patient with hx metastatic endometrial cancer, history of anal cancer, who presents with abdominal pain, distension, getting worse in the past couple weeks.  Despite tx, has had progression of her disease.  Had recent CT scan which showed a partial sbo, and ascites. Is due to have paracentesis tomorrow. Patient c/o nausea and vomiting. Denies bloody or bilious emesis. Had bm today, small. Denies fever or chills.  No current iv chemotherapy, states she takes pills. Patient states is taking her normal meds at home. She was scheduled to have a paracentesis today but ended up in the ED. She was noted to have a partial SBO. General surgery was consulted and recommended full liquid diet and paracentesis.  They did not feel that she would be a good surgical candidate given her active chemotherapy and malignancy. The patient has been having active discussions with her oncologist regarding goals of care.  Review of Systems: All systems reviewed and apart from history of presenting illness, are negative.  Past Medical History:  Diagnosis Date  . Anemia, unspecified   . Anxiety state, unspecified   . Chronic hepatitis, unspecified    pt thinks type B  . Endometrial cancer ([place]) 04/2015   serous adenocarcinoma  . Endometrial cancer, FIGO stage IVB ([place]) 07/05/2016  . History of brachytherapy 06/01/15, 06/10/15, 06/17/15, 06/24/15, 07/01/15   vaginal cuff 27.5 gray  . Malignant neoplasm of anal canal ([place]) 15 yrs ago   treated with radiation   . Mitral valve disorders(424.0)   . Osteoporosis   . Other abnormal glucose   . Other and unspecified hyperlipidemia   . Transfusion history    "back in my 20's"  . Unspecified essential  hypertension    Past Surgical History:  Procedure Laterality Date  . BREAST LUMPECTOMY     right breast BENIGN  . CHOLECYSTECTOMY    . ROBOTIC ASSISTED TOTAL HYSTERECTOMY WITH BILATERAL SALPINGO OOPHERECTOMY Bilateral 04/20/2015   Procedure: ROBOTIC ASSISTED TOTAL HYSTERECTOMY WITH BILATERAL SALPINGO OOPHORECTOMY AND PELVIC AND PERIAORTIC LYMPHADECTOMY;  Surgeon: Janie Morning, MD;  Location: [facility];  Service: Gynecology;  Laterality: Bilateral;  . TONSILLECTOMY     Social History:  reports that she has quit smoking. She has never used smokeless tobacco. She reports that she does not drink alcohol or use drugs.   No Known Allergies  Family History  Problem Relation Age of Onset  . Prostate cancer Brother     dx. 6s  . Stroke Father   . Breast cancer Sister     dx. 64-65  . Dementia Brother   . Breast cancer Sister 61    mastectomy  . Leukemia Sister     dx. 37s-70s  . Colon cancer Neg Hx    Prior to Admission medications   Medication Sig Start Date End Date Taking? Authorizing Provider  atenolol (TENORMIN) 25 MG tablet take 3 tablets by mouth once daily Patient taking differently: take 75mg  (3 tablets) by mouth once daily 06/27/16  Yes Cassandria Anger, MD  Cholecalciferol (VITAMIN D3) 2000 UNITS capsule Take 2,000 Units by mouth daily.    Yes Historical Provider, MD  clorazepate (TRANXENE) 7.5 MG tablet Take 1 tablet (7.5 mg total) by mouth 2 (two) times daily as needed for  anxiety. 02/08/16  Yes [name] Plotnikov, MD  feeding supplement, ENSURE ENLIVE, (ENSURE ENLIVE) LIQD Take 237 mLs by mouth 2 (two) times daily between meals. Patient taking differently: Take 237 mLs by mouth 2 (two) times daily as needed (poor appetite).  09/29/15  Yes Barton Dubois, MD  FERROCITE 324 MG TABS tablet Take 1 tablet (106 mg of iron total) by mouth daily. Patient taking differently: Take 1 tablet by mouth every other day.  04/12/16  Yes Lennis Marion Downer, MD  lidocaine-prilocaine (EMLA)  cream Apply 1-2 hours prior to [facility] Access as needed. Patient taking differently: Apply 1 application topically daily as needed (port access).  04/03/16  Yes Lennis Marion Downer, MD  LORazepam (ATIVAN) 0.5 MG tablet Place 1 tablet under the tongue or swallow every 6 hours as needed for nausea. 07/13/16  Yes Lennis Marion Downer, MD  megestrol (MEGACE) 40 MG tablet Take 2 tablets (80 mg total) by mouth 2 (two) times daily. 07/13/16  Yes Lennis Marion Downer, MD  Multiple Vitamins-Minerals (CENTRUM SILVER PO) Take 1 capsule by mouth daily.    Yes Historical Provider, MD  polyethylene glycol (MIRALAX / GLYCOLAX) packet Take 17 g by mouth daily as needed for moderate constipation. Reported on 12/09/2015   Yes Historical Provider, MD  potassium chloride SA (K-DUR,KLOR-CON) 20 MEQ tablet Take 1 tablet (20 mEq total) by mouth daily. Patient taking differently: Take 20 mEq by mouth 2 (two) times a week.  11/09/15  Yes [name] Plotnikov, MD  RABEprazole (ACIPHEX) 20 MG tablet Take 1 tablet (20 mg total) by mouth 2 (two) times daily. Patient not taking: Reported on 07/17/2016 06/23/16   Cassandria Anger, MD   Physical Exam: Vitals:   07/17/16 1146 07/17/16 1148 07/17/16 1435  BP: 137/99  135/78  Pulse: 115  94  Resp: 18  18  Temp: 98.1 F (36.7 C)  98.7 F (37.1 C)  TempSrc: Oral  Oral  SpO2: 97%  97%  Weight:  72.1 kg (159 lb)   Height:  5\' 6"  (1.676 m)    General: pleasant, WD/WN AA female who is laying in bed in NAD HEENT: head is normocephalic, atraumatic.  Heart: regular, rate, and rhythm.  No obvious murmurs, gallops, or rubs noted.  Palpable pedal pulses bilaterally Lungs: CTAB, no wheezes, rhonchi, or rales noted.  Respiratory effort nonlabored Abd: soft, NT, +BS, no masses, hernias, or organomegaly MS: all 4 extremities are symmetrical with no cyanosis, clubbing, or edema. Skin: warm and dry with no masses, lesions, or rashes Psych: appropriate affect. Neuro: A&Ox3, normal speech  Labs on  Admission:  Basic Metabolic Panel:  Recent Labs Lab 07/17/16 1220  NA 132*  K 3.6  CL 99*  CO2 24  GLUCOSE 146*  BUN 15  CREATININE 1.03*  CALCIUM 9.0   Liver Function Tests:  Recent Labs Lab 07/17/16 1220  AST 27  ALT 12*  ALKPHOS 42  BILITOT 0.8  PROT 7.2  ALBUMIN 3.2*   No results for input(s): LIPASE, AMYLASE in the last 168 hours. No results for input(s): AMMONIA in the last 168 hours. CBC:  Recent Labs Lab 07/17/16 1220  WBC 5.9  HGB 12.4  HCT 37.7  MCV 91.3  PLT 279   Cardiac Enzymes: No results for input(s): CKTOTAL, CKMB, CKMBINDEX, TROPONINI in the last 168 hours.  BNP (last 3 results) No results for input(s): PROBNP in the last 8760 hours. CBG: No results for input(s): GLUCAP in the last 168 hours.  Radiological Exams  on Admission: Dg Abd Acute W/chest  Result Date: 07/17/2016 CLINICAL DATA:  75 year old female with abdominal pain and vomiting for 1 week. Metastatic endometrial cancer with ascites. Subsequent encounter. EXAM: DG ABDOMEN ACUTE W/ 1V CHEST COMPARISON:  Restaging CT Abdomen and Pelvis 07/10/2016, and earlier. FINDINGS: Right chest porta cath in place. Mediastinal contours are stable and within normal limits. Stable lung volumes. Mild eventration of the right hemidiaphragm. The lungs are clear. No pneumothorax or pneumoperitoneum. No pulmonary nodule identified. Small bowel obstruction gas pattern, significantly worse than on the recent CT Abdomen and Pelvis. Dilated small bowel loops up to 5 cm. Paucity of large bowel gas. Mild gray hazy opacity throughout the abdomen in keeping with the ascites which was demonstrated recently. Stable cholecystectomy clips. No acute osseous abnormality identified. IMPRESSION: 1. Progressive and now moderate to severe small bowel obstruction since the recent restaging CT Abdomen and Pelvis on 07/10/2016. Distal small bowel involvement by peritoneal metastases on that exam. No free air. 2.  No acute  cardiopulmonary abnormality. Electronically Signed   By: Genevie Ann M.D.   On: 07/17/2016 13:00   EKG: Independently reviewed.  Assessment/Plan Principal Problem:   Small bowel obstruction Active Problems:   Essential hypertension   Uterine cancer (HCC)   Endometrial cancer (HCC)   Constipation   Abdominal distension   Abdominal pain   Metastatic disease (HCC)   Ascites   Acute kidney injury ([place])   Peritoneal carcinomatosis ([place])   History of rectal or anal cancer   GERD (gastroesophageal reflux disease)   Protein calorie malnutrition (HCC)   Partial small bowel obstruction   Dehydration   Partial small bowel obstruction-asymptomatic at this time, continue full liquid diet per surgery recommendations. Patient is not a surgical candidate given her active cancer and chemotherapy.  Abdominal pain/distention secondary to ascites-patient was ordered to have a therapeutic paracentesis. Greater than 4 L of fluid was removed and patient feels significantly improved.  Uterine cancer/endometrial cancer/peritoneal carcinomatosis and metastatic disease-currently being treated by oncology. Goals of care discussions are ongoing at this time. I recommended a palliative care consult for assistance with clarifying goals of care.  If they can't see her in hospital patient to follow up with Dr. Marko Plume for further discussions.  Acute kidney injury secondary to dehydration. IV fluid hydration as been ordered. Encourage oral liquids consumption.  GERD-stable and Protonix ordered for GI protection.    DVT Prophylaxis: Lovenox Code Status: DO NOT RESUSCITATE Family Communication: Has been updated at bedside  Disposition Plan:  likely can go home tomorrow if tolerates diet  Irwin Brakeman, MD [facility] Pager [phone]  If 7PM-7AM, please contact night-coverage [email or web address] Password [email or web address] 07/17/2016, 2:41 PM

## 2016-07-17 NOTE — Consult Note (Signed)
[facility] Surgery Consult Note  Sharon Holt 09-22-1941  [id number].    Requesting MD: Dr. 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 Skeet Latch) who presented to [facility] with abdominal pain. She is followed closely by Dr. Marko Plume and Dr. Skeet Latch. States she was scheduled for a paracentesis tomorrow, however her abdominal discomfort became intolerable so she came to the hospital in hopes of having the procedure performed today. Pain rated as a 4/10 and located in upper abdomen. Patient reports vomiting yesterday after a greasy meal, but denies emesis since that time. Patient is having loose bowel movements daily and takes Miralax every evening. She is having flatus. She maintains a soft, low-fiber diet. She denies nausea, CP, SOB, hematemesis, melena, hematochezia, or urinary symptoms.  ROS: All systems reviewed and otherwise negative except for as above  Family History  Problem Relation Age of Onset  . Prostate cancer Brother     dx. 36s  . Stroke Father   . Breast cancer Sister     dx. 64-65  . Dementia Brother   . Breast cancer Sister 78    mastectomy  . Leukemia Sister     dx. 71s-70s  . Colon cancer Neg Hx     Past Medical History:  Diagnosis Date  . Anemia, unspecified   . Anxiety state, unspecified   . Chronic hepatitis, unspecified    pt thinks type B  . Endometrial cancer ([place]) 04/2015   serous adenocarcinoma  . 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 History of brachytherapy 06/01/15, 06/10/15, 06/17/15, 06/24/15, 07/01/15   vaginal cuff 27.5 gray  . 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 Mitral valve disorders(424.0)   . Osteoporosis   . Other abnormal glucose   . Other and unspecified hyperlipidemia   . Transfusion history    "back in my  20's"  . Unspecified essential hypertension     Past Surgical History:  Procedure Laterality Date  . BREAST LUMPECTOMY     right breast BENIGN  . CHOLECYSTECTOMY    . ROBOTIC ASSISTED TOTAL HYSTERECTOMY WITH BILATERAL SALPINGO OOPHERECTOMY Bilateral 04/20/2015   Procedure: ROBOTIC ASSISTED TOTAL HYSTERECTOMY WITH BILATERAL SALPINGO OOPHORECTOMY AND PELVIC AND PERIAORTIC LYMPHADECTOMY;  Surgeon: Janie Morning, MD;  Location: [facility];  Service: Gynecology;  Laterality: Bilateral;  . TONSILLECTOMY      Social History:  reports that she has quit smoking. She has never used smokeless tobacco. She reports that she does not drink alcohol or use drugs.  Allergies: No Known Allergies   (Not in a hospital admission)  Blood pressure 137/99, pulse 115, temperature 98.1 F (36.7 C), temperature source Oral, resp. rate 18, height '5\' 6"'$  (1.676 m), weight 159 lb (72.1 kg), SpO2 97 %. Physical Exam: General: pleasant, WD/WN AA female who is laying in bed in NAD HEENT: head is normocephalic, atraumatic.  Heart: regular, rate, and rhythm.  No obvious murmurs, gallops, or rubs noted.  Palpable pedal pulses bilaterally Lungs: CTAB, no wheezes, rhonchi, or rales noted.  Respiratory effort nonlabored Abd: soft, NT, moderate distention, +BS, no masses, hernias, or organomegaly MS: all 4 extremities are symmetrical with no cyanosis, clubbing, or edema. Skin: warm and dry with no masses, lesions, or rashes Psych: appropriate affect. Neuro: A&Ox3, normal speech  Results for orders placed or performed during the hospital encounter  of 07/17/16 (from the past 48 hour(s))  CBC     Status: None   Collection Time: 07/17/16 12:20 PM  Result Value Ref Range   WBC 5.9 4.0 - 10.5 K/uL   RBC 4.13 3.87 - 5.11 MIL/uL   Hemoglobin 12.4 12.0 - 15.0 g/dL   HCT 37.7 36.0 - 46.0 %   MCV 91.3 78.0 - 100.0 fL   MCH 30.0 26.0 - 34.0 pg   MCHC 32.9 30.0 - 36.0 g/dL   RDW 14.2 11.5 - 15.5 %   Platelets 279 150 - 400 K/uL   Comprehensive metabolic panel     Status: Abnormal   Collection Time: 07/17/16 12:20 PM  Result Value Ref Range   Sodium 132 (L) 135 - 145 mmol/L   Potassium 3.6 3.5 - 5.1 mmol/L   Chloride 99 (L) 101 - 111 mmol/L   CO2 24 22 - 32 mmol/L   Glucose, Bld 146 (H) 65 - 99 mg/dL   BUN 15 6 - 20 mg/dL   Creatinine, Ser 1.03 (H) 0.44 - 1.00 mg/dL   Calcium 9.0 8.9 - 10.3 mg/dL   Total Protein 7.2 6.5 - 8.1 g/dL   Albumin 3.2 (L) 3.5 - 5.0 g/dL   AST 27 15 - 41 U/L   ALT 12 (L) 14 - 54 U/L   Alkaline Phosphatase 42 38 - 126 U/L   Total Bilirubin 0.8 0.3 - 1.2 mg/dL   GFR calc non Af Amer 52 (L) >60 mL/min   GFR calc Af Amer >60 >60 mL/min    Comment: (NOTE) The eGFR has been calculated using the CKD EPI equation. This calculation has not been validated in all clinical situations. eGFR's persistently <60 mL/min signify possible Chronic Kidney Disease.    Anion gap 9 5 - 15   Dg Abd Acute W/chest  Result Date: 07/17/2016 CLINICAL DATA:  75 year old female with abdominal pain and vomiting for 1 week. Metastatic endometrial cancer with ascites. Subsequent encounter. EXAM: DG ABDOMEN ACUTE W/ 1V CHEST COMPARISON:  Restaging CT Abdomen and Pelvis 07/10/2016, and earlier. FINDINGS: Right chest porta cath in place. Mediastinal contours are stable and within normal limits. Stable lung volumes. Mild eventration of the right hemidiaphragm. The lungs are clear. No pneumothorax or pneumoperitoneum. No pulmonary nodule identified. Small bowel obstruction gas pattern, significantly worse than on the recent CT Abdomen and Pelvis. Dilated small bowel loops up to 5 cm. Paucity of large bowel gas. Mild gray hazy opacity throughout the abdomen in keeping with the ascites which was demonstrated recently. Stable cholecystectomy clips. No acute osseous abnormality identified. IMPRESSION: 1. Progressive and now moderate to severe small bowel obstruction since the recent restaging CT Abdomen and Pelvis on 07/10/2016.  Distal small bowel involvement by peritoneal metastases on that exam. No free air. 2.  No acute cardiopulmonary abnormality. Electronically Signed   By: Genevie Ann M.D.   On: 07/17/2016 13:00    Assessment/Plan Partial SBO secondary to progressive high-grade endometrial cancer S/p Total robotic hysterectomy, BL salingo oophorectomy, and pelvic lymphadenectomy - CT Abd/Pelv W/ Contrast 07/10/16: progressive disease, abdominal pelvis ascites, increased mets to omentum and peritoneum. Low grade partial SBO secondary to mets in pelvic cul-de-sac. - Abdominal film 07/17/16: progressive, moderate SBO without free air  Recommendations: Currently having bowel function and without nausea/vomiting. The patient has no acute surgical needs at this time. Recommend paracentesis to relieve abdominal pain/discomfort and follow-up with Dr. Skeet Latch and Dr. Marko Plume for further treatment recommendations of metastatic disease. The patient should  continue eating a full liquid-soft diet to avoid worsening of obstructive symptoms. General surgery will sign off for now, but will be available as needed.  Thank you for this consult.   Jill Alexanders, [facility] Surgery 07/17/2016, 1:47 PM Pager: [phone] Consults: [phone] Mon-Fri 7:00 am-4:30 pm Sat-Sun 7:00 am-11:30 am

## 2016-07-17 NOTE — ED Notes (Signed)
Patient transported to X-ray 

## 2016-07-17 NOTE — ED Triage Notes (Signed)
Pt c/o generalized abdominal pain. Pt states she has an appointment tomorrow to have the fluid on her stomach removed but she is in too much pain and had to come today. Pt is seen in the cancer center.

## 2016-07-17 NOTE — Procedures (Signed)
Ultrasound-guided therapeutic paracentesis performed yielding 4.2 liters of amber colored fluid. No immediate complications.  Maksymilian Mabey E 4:20 PM 07/17/2016

## 2016-07-18 ENCOUNTER — Ambulatory Visit (HOSPITAL_COMMUNITY): Admission: RE | Admit: 2016-07-18 | Payer: Commercial Managed Care - HMO | Source: Ambulatory Visit

## 2016-07-18 DIAGNOSIS — E785 Hyperlipidemia, unspecified: Secondary | ICD-10-CM | POA: Diagnosis present

## 2016-07-18 DIAGNOSIS — E739 Lactose intolerance, unspecified: Secondary | ICD-10-CM | POA: Diagnosis present

## 2016-07-18 DIAGNOSIS — C786 Secondary malignant neoplasm of retroperitoneum and peritoneum: Secondary | ICD-10-CM | POA: Diagnosis present

## 2016-07-18 DIAGNOSIS — Z803 Family history of malignant neoplasm of breast: Secondary | ICD-10-CM | POA: Diagnosis not present

## 2016-07-18 DIAGNOSIS — N179 Acute kidney failure, unspecified: Secondary | ICD-10-CM | POA: Diagnosis present

## 2016-07-18 DIAGNOSIS — R109 Unspecified abdominal pain: Secondary | ICD-10-CM | POA: Diagnosis present

## 2016-07-18 DIAGNOSIS — I10 Essential (primary) hypertension: Secondary | ICD-10-CM | POA: Diagnosis present

## 2016-07-18 DIAGNOSIS — M810 Age-related osteoporosis without current pathological fracture: Secondary | ICD-10-CM | POA: Diagnosis present

## 2016-07-18 DIAGNOSIS — E860 Dehydration: Secondary | ICD-10-CM | POA: Diagnosis present

## 2016-07-18 DIAGNOSIS — F411 Generalized anxiety disorder: Secondary | ICD-10-CM | POA: Diagnosis present

## 2016-07-18 DIAGNOSIS — C541 Malignant neoplasm of endometrium: Secondary | ICD-10-CM | POA: Diagnosis present

## 2016-07-18 DIAGNOSIS — R140 Abdominal distension (gaseous): Secondary | ICD-10-CM | POA: Diagnosis not present

## 2016-07-18 DIAGNOSIS — K219 Gastro-esophageal reflux disease without esophagitis: Secondary | ICD-10-CM | POA: Diagnosis present

## 2016-07-18 DIAGNOSIS — Z6823 Body mass index (BMI) 23.0-23.9, adult: Secondary | ICD-10-CM | POA: Diagnosis not present

## 2016-07-18 DIAGNOSIS — Z806 Family history of leukemia: Secondary | ICD-10-CM | POA: Diagnosis not present

## 2016-07-18 DIAGNOSIS — K56609 Unspecified intestinal obstruction, unspecified as to partial versus complete obstruction: Secondary | ICD-10-CM | POA: Diagnosis not present

## 2016-07-18 DIAGNOSIS — K739 Chronic hepatitis, unspecified: Secondary | ICD-10-CM | POA: Diagnosis present

## 2016-07-18 DIAGNOSIS — Z87891 Personal history of nicotine dependence: Secondary | ICD-10-CM | POA: Diagnosis not present

## 2016-07-18 DIAGNOSIS — E43 Unspecified severe protein-calorie malnutrition: Secondary | ICD-10-CM | POA: Diagnosis present

## 2016-07-18 DIAGNOSIS — D638 Anemia in other chronic diseases classified elsewhere: Secondary | ICD-10-CM | POA: Diagnosis present

## 2016-07-18 DIAGNOSIS — Z823 Family history of stroke: Secondary | ICD-10-CM | POA: Diagnosis not present

## 2016-07-18 DIAGNOSIS — Z923 Personal history of irradiation: Secondary | ICD-10-CM | POA: Diagnosis not present

## 2016-07-18 DIAGNOSIS — R188 Other ascites: Secondary | ICD-10-CM | POA: Diagnosis present

## 2016-07-18 DIAGNOSIS — Z66 Do not resuscitate: Secondary | ICD-10-CM | POA: Diagnosis present

## 2016-07-18 DIAGNOSIS — Z79899 Other long term (current) drug therapy: Secondary | ICD-10-CM | POA: Diagnosis not present

## 2016-07-18 DIAGNOSIS — Z85048 Personal history of other malignant neoplasm of rectum, rectosigmoid junction, and anus: Secondary | ICD-10-CM | POA: Diagnosis not present

## 2016-07-18 LAB — CBC
HCT: 33.3 % — ABNORMAL LOW (ref 36.0–46.0)
HEMOGLOBIN: 11.1 g/dL — AB (ref 12.0–15.0)
MCH: 30.6 pg (ref 26.0–34.0)
MCHC: 33.3 g/dL (ref 30.0–36.0)
MCV: 91.7 fL (ref 78.0–100.0)
Platelets: 237 10*3/uL (ref 150–400)
RBC: 3.63 MIL/uL — AB (ref 3.87–5.11)
RDW: 14.3 % (ref 11.5–15.5)
WBC: 4.7 10*3/uL (ref 4.0–10.5)

## 2016-07-18 LAB — COMPREHENSIVE METABOLIC PANEL
ALK PHOS: 35 U/L — AB (ref 38–126)
ALT: 10 U/L — AB (ref 14–54)
AST: 23 U/L (ref 15–41)
Albumin: 2.8 g/dL — ABNORMAL LOW (ref 3.5–5.0)
Anion gap: 5 (ref 5–15)
BUN: 13 mg/dL (ref 6–20)
CALCIUM: 8.6 mg/dL — AB (ref 8.9–10.3)
CO2: 29 mmol/L (ref 22–32)
CREATININE: 0.95 mg/dL (ref 0.44–1.00)
Chloride: 105 mmol/L (ref 101–111)
GFR calc non Af Amer: 58 mL/min — ABNORMAL LOW (ref >60)
Glucose, Bld: 105 mg/dL — ABNORMAL HIGH (ref 65–99)
Potassium: 3.9 mmol/L (ref 3.5–5.1)
SODIUM: 139 mmol/L (ref 135–145)
Total Bilirubin: 0.8 mg/dL (ref 0.3–1.2)
Total Protein: 6.1 g/dL — ABNORMAL LOW (ref 6.5–8.1)

## 2016-07-18 MED ORDER — ENSURE ENLIVE PO LIQD: 237.0000 mL | Freq: Three times a day (TID) | ORAL | Status: DC

## 2016-07-18 NOTE — Progress Notes (Signed)
Patient ID: Sharon Holt, female   DOB: [birth year], 75 y.o.   MRN: [id number]  PROGRESS NOTE    Sharon Holt  [id number] DOB: Dec 21, 1940 DOA: 07/17/2016  PCP: Walker Kehr, MD   Brief Narrative:  75 y.o. female  with past medical history significant for metastatic endometrial cancer, anal cancer, hypertension who presented to [facility] with ongoing abdominal pain, distention, intermittent nausea and vomiting.  Patient was hemodynamically stable on the admission. She had paracentesis 07/17/2016 with 4.2 L fluid removed. Abdominal x-ray showed progressive and moderate to severe small bowel obstruction since recent restaging CT. Patient was seen by surgery in consultation and they do not see any role for surgery at this time. Plan is to have palliative G-tube placement by interventional radiology, interventional radiology consulted.  Assessment & Plan:   Principal Problem:   Small bowel obstruction - In the setting of metastatic endometrial cancer - Patient is currently on clear liquids/nothing by mouth. Her diet input is minimal - Interventional radiology consulted for palliative G-tube placement - No role for surgical intervention per surgery - Continue supportive care with IV fluids and analgesia, antiemetics as needed   Active Problems:   Uterine cancer ([place]) / Endometrial cancer ([place]) - Send message to Dr. Marko Plume informing her of patient's admission     Acute kidney injury - Secondary to dehydration - Creatinine within normal limits, resolved with IV fluids    Abdominal distension - Ascites secondary to endometrial cancer  - 4.2 L fluid removed with paracentesis on 07/17/2016     Anemia of chronic disease - Secondary to history of malignancy - Hemoglobin 11.1      Severe protein calorie malnutrition  - In the context of chronic illness  - Continue nutritional supplementation     Essential hypertension - Continue atenolol 75 mg  daily    DVT prophylaxis: Lovenox subcutaneous Code Status: DNR/DNI Family Communication: Family at the bedside Disposition Plan: We will let know patient's oncologist of patient's admission. Appreciate interventional radiology seen the patient in consultation for palliative G-tube placement   Consultants:   Surgery  IR  Procedures:   Paracentesis 10/2 - 4.2 L fluid removed   Antimicrobials:   None    Subjective: No overnight events.   Objective: Vitals:   07/17/16 1601 07/17/16 1652 07/17/16 2227 07/18/16 0527  BP: 115/74 (!) 152/80 (!) 141/98 132/79  Pulse:   (!) 116 97  Resp:  18 18 16   Temp:  98.6 F (37 C) 98.6 F (37 C) 98.3 F (36.8 C)  TempSrc:   Oral Oral  SpO2:  100% 100% 100%  Weight:    65.2 kg (143 lb 12.8 oz)  Height:        Intake/Output Summary (Last 24 hours) at 07/18/16 1239 Last data filed at 07/17/16 1920  Gross per 24 hour  Intake             1555 ml  Output                0 ml  Net             1555 ml   Filed Weights   07/17/16 1148 07/18/16 0527  Weight: 72.1 kg (159 lb) 65.2 kg (143 lb 12.8 oz)    Examination:  General exam: Appears calm and comfortable  Respiratory system: Clear to auscultation. Respiratory effort normal. Cardiovascular system: S1 & S2 heard, RRR. No JVD, murmurs, rubs, gallops or clicks. No pedal edema. Gastrointestinal system:  Abdomen is softly distended, (+) BS Central nervous system: Alert and oriented. No focal neurological deficits. Extremities: Symmetric 5 x 5 power. Skin: No rashes, lesions or ulcers Psychiatry: Judgement and insight appear normal. Mood & affect appropriate.   Data Reviewed: I have personally reviewed following labs and imaging studies  CBC:  Recent Labs Lab 07/17/16 1220 07/18/16 0508  WBC 5.9 4.7  HGB 12.4 11.1*  HCT 37.7 33.3*  MCV 91.3 91.7  PLT 279 [id number]   Basic Metabolic Panel:  Recent Labs Lab 07/17/16 1220 07/18/16 0508  NA 132* 139  K 3.6 3.9  CL 99* 105   CO2 24 29  GLUCOSE 146* 105*  BUN 15 13  CREATININE 1.03* 0.95  CALCIUM 9.0 8.6*   GFR: Estimated Creatinine Clearance: 48.6 mL/min (by C-G formula based on SCr of 0.95 mg/dL). Liver Function Tests:  Recent Labs Lab 07/17/16 1220 07/18/16 0508  AST 27 23  ALT 12* 10*  ALKPHOS 42 35*  BILITOT 0.8 0.8  PROT 7.2 6.1*  ALBUMIN 3.2* 2.8*   No results for input(s): LIPASE, AMYLASE in the last 168 hours. No results for input(s): AMMONIA in the last 168 hours. Coagulation Profile: No results for input(s): INR, PROTIME in the last 168 hours. Cardiac Enzymes: No results for input(s): CKTOTAL, CKMB, CKMBINDEX, TROPONINI in the last 168 hours. BNP (last 3 results) No results for input(s): PROBNP in the last 8760 hours. HbA1C: No results for input(s): HGBA1C in the last 72 hours. CBG: No results for input(s): GLUCAP in the last 168 hours. Lipid Profile: No results for input(s): CHOL, HDL, LDLCALC, TRIG, CHOLHDL, LDLDIRECT in the last 72 hours. Thyroid Function Tests: No results for input(s): TSH, T4TOTAL, FREET4, T3FREE, THYROIDAB in the last 72 hours. Anemia Panel: No results for input(s): VITAMINB12, FOLATE, FERRITIN, TIBC, IRON, RETICCTPCT in the last 72 hours. Urine analysis:    Component Value Date/Time   COLORURINE YELLOW 09/27/2015 1944   APPEARANCEUR CLEAR 09/27/2015 1944   LABSPEC 1.006 09/27/2015 1944   PHURINE 7.0 09/27/2015 1944   GLUCOSEU NEGATIVE 09/27/2015 1944   GLUCOSEU NEGATIVE 08/09/2015 1110   HGBUR NEGATIVE 09/27/2015 1944   BILIRUBINUR NEGATIVE 09/27/2015 1944   KETONESUR NEGATIVE 09/27/2015 1944   PROTEINUR NEGATIVE 09/27/2015 1944   UROBILINOGEN 0.2 08/09/2015 1110   NITRITE NEGATIVE 09/27/2015 1944   LEUKOCYTESUR NEGATIVE 09/27/2015 1944   Sepsis Labs: @LABRCNTIP (procalcitonin:4,lacticidven:4)   )No results found for this or any previous visit (from the past 240 hour(s)).    Radiology Studies: US Paracentesis Result Date:  07/17/2016 Successful ultrasound-guided paracentesis yielding 4.2 liters of peritoneal fluid.   Dg Abd Acute W/chest Result Date: 07/17/2016  1. Progressive and now moderate to severe small bowel obstruction since the recent restaging CT Abdomen and Pelvis on 07/10/2016. Distal small bowel involvement by peritoneal metastases on that exam. No free air. 2.  No acute cardiopulmonary abnormality.   Scheduled Meds: . atenolol  75 mg Oral Daily  . enoxaparin (LOVENOX) injection  40 mg Subcutaneous Q24H  . feeding supplement (ENSURE ENLIVE)  237 mL Oral BID BM  . Ferrous Fumarate  1 tablet Oral QODAY  . megestrol  80 mg Oral BID  . multivitamin with minerals  1 tablet Oral Daily  . pantoprazole  40 mg Oral Q0600   Continuous Infusions: . sodium chloride 75 mL/hr at 07/18/16 0625     LOS: 0 days    Time spent: 25 minutes  Greater than 50% of the time spent on counseling and coordinating the  care.   Leisa Lenz, MD [facility] Pager [phone]  If 7PM-7AM, please contact night-coverage [email or web address] Password [email or web address] 07/18/2016, 12:39 PM

## 2016-07-18 NOTE — Progress Notes (Signed)
Patient ID: Sharon Holt, female   DOB: October 31, 1940, 75 y.o.   MRN: [id number] Request received for palliative G tube placement in pt. Case reviewed by Dr. Barbie Banner. Pt has hx of ascites and underwent paracentesis yesterday with 4 liters removed. Placement of perc G tube via IR technique contraindicated with presence of ascites. Above d/w Dr. Charlies Silvers.

## 2016-07-18 NOTE — Consult Note (Signed)
Consultation Note Date: 07/18/2016   Patient Name: Sharon Holt  DOB: [birth year]  MRN: [id number]  Age / Sex: 75 y.o., female  PCP: Cassandria Anger, MD Referring Physician: Robbie Lis, MD  Reason for Consultation: Establishing goals of care, non pain symptoms of nausea vomiting, abdominal pain.   HPI/Patient Profile: 75 y.o. female   admitted on 07/17/2016    Clinical Assessment and Goals of Care:  75 yo lady with life limiting illness of progressive high grade endometrial carcinoma, sees Dr Marko Plume, recent CT scan of abdomen and pelvis on 07-10-16 with partial mid SBO, progression, increased omental caking and ascites.   Patient also has underlying history of anal cancer and HTN, she presented to [facility] with abdominal pain, distension, intermittent nausea and vomiting.   Patient has been seen by surgery, she is presently awaiting interventional radiology evaluation to consider venting PEG placement. Palliative care consulted for additional discussions with regards to her goals of care and symptom management.   The patient is resting in her chair, she is awake alert in good spirits. She has tolerated soft diet, she threw up last night once, she denies any abdominal pain, denies any nausea or vomiting currently.   I introduced myself and palliative care as follows: Palliative medicine is specialized medical care for people living with serious illness. It focuses on providing relief from the symptoms and stress of a serious illness. The goal is to improve quality of life for both the patient and the family.  Patient states she is aware of the progressive incurable nature of her illness, she wishes to be as well as she can for as long as she can. She cites strong faith as her support system, besides her husband as well. He is not currently at the bedside. Briefly discussed with her regarding  interventional and medical management of malignant bowel obstructions. All questions answered to the best of my ability on this initial visit with this nice lady, see recommendations below, thank you for the consult.   NEXT OF KIN  husband Sharon Holt at [phone] or [phone].    SUMMARY OF RECOMMENDATIONS    Agree with DNR Agree with radiology consult to evaluate patient for venting PEG Thankfully, no extensive symptom burden from the malignant SBO that warrants use of Haldol, Robinul etc. Palliative will follow along. Continue MiraLax, Zofran, Ativan and Morphine IV PRN.  Palliative to continue to follow along, help guide appropriate decision making upon discharge. Patient states she is aware and at peace with her diagnosis. Offered active listening and supportive care.   Code Status/Advance Care Planning:  DNR    Symptom Management:    continue current management.    Palliative Prophylaxis:   Bowel Regimen   Psycho-social/Spiritual:   Desire for further Chaplaincy support:no  Additional Recommendations: Caregiving  Support/Resources  Prognosis:   Unable to determine  Discharge Planning: To Be Determined      Primary Diagnoses: Present on Admission: . Small bowel obstruction . Dehydration .  Essential hypertension . Uterine cancer ([place]) . Endometrial cancer ([place]) . Metastatic disease ([place]) . Abdominal pain . Abdominal distension . Constipation . Ascites . Acute kidney injury ([place]) . Peritoneal carcinomatosis ([place]) . History of rectal or anal cancer . GERD (gastroesophageal reflux disease) . Protein calorie malnutrition ([place]) . Partial small bowel obstruction   I have reviewed the medical record, interviewed the patient and family, and examined the patient. The following aspects are pertinent.  Past Medical History:  Diagnosis Date  . Anemia, unspecified   . Anxiety state, unspecified   . Chronic hepatitis, unspecified    pt thinks type B  .  Endometrial cancer ([place]) 04/2015   serous adenocarcinoma  . Endometrial cancer, FIGO stage IVB ([place]) 07/05/2016  . History of brachytherapy 06/01/15, 06/10/15, 06/17/15, 06/24/15, 07/01/15   vaginal cuff 27.5 gray  . Malignant neoplasm of anal canal ([place]) 15 yrs ago   treated with radiation   . Mitral valve disorders(424.0)   . Osteoporosis   . Other abnormal glucose   . Other and unspecified hyperlipidemia   . Transfusion history    "back in my 20's"  . Unspecified essential hypertension    Social History   Social History  . Marital status: Married    Spouse name: Sharon Holt  . Number of children: 3  . Years of education: N/A   Occupational History  . Retired     Company secretary   Social History Main Topics  . Smoking status: Former Research scientist (life sciences)  . Smokeless tobacco: Never Used  . Alcohol use No  . Drug use: No  . Sexual activity: No   Other Topics Concern  . None   Social History Narrative  . None   Family History  Problem Relation Age of Onset  . Prostate cancer Brother     dx. 38s  . Stroke Father   . Breast cancer Sister     dx. 64-65  . Dementia Brother   . Breast cancer Sister 43    mastectomy  . Leukemia Sister     dx. 42s-70s  . Colon cancer Neg Hx    Scheduled Meds: . atenolol  75 mg Oral Daily  . enoxaparin (LOVENOX) injection  40 mg Subcutaneous Q24H  . feeding supplement (ENSURE ENLIVE)  237 mL Oral BID BM  . Ferrous Fumarate  1 tablet Oral QODAY  . megestrol  80 mg Oral BID  . multivitamin with minerals  1 tablet Oral Daily  . pantoprazole  40 mg Oral Q0600   Continuous Infusions: . sodium chloride 75 mL/hr at 07/18/16 0625   PRN Meds:.LORazepam, morphine injection, ondansetron **OR** ondansetron (ZOFRAN) IV, polyethylene glycol Medications Prior to Admission:  Prior to Admission medications   Medication Sig Start Date End Date Taking? Authorizing Provider  atenolol (TENORMIN) 25 MG tablet take 3 tablets by mouth once daily Patient taking  differently: take 75mg  (3 tablets) by mouth once daily 06/27/16  Yes Cassandria Anger, MD  Cholecalciferol (VITAMIN D3) 2000 UNITS capsule Take 2,000 Units by mouth daily.    Yes Historical Provider, MD  clorazepate (TRANXENE) 7.5 MG tablet Take 1 tablet (7.5 mg total) by mouth 2 (two) times daily as needed for anxiety. 02/08/16  Yes [name] Plotnikov, MD  feeding supplement, ENSURE ENLIVE, (ENSURE ENLIVE) LIQD Take 237 mLs by mouth 2 (two) times daily between meals. Patient taking differently: Take 237 mLs by mouth 2 (two) times daily as needed (poor appetite).  09/29/15  Yes Barton Dubois, MD  FERROCITE 324 MG TABS tablet Take 1 tablet (106 mg of iron total) by mouth daily. Patient taking differently: Take 1 tablet by mouth every other day.  04/12/16  Yes Lennis Marion Downer, MD  lidocaine-prilocaine (EMLA) cream Apply 1-2 hours prior to [place] Access as needed. Patient taking differently: Apply 1 application topically daily as needed (port access).  04/03/16  Yes Lennis Marion Downer, MD  LORazepam (ATIVAN) 0.5 MG tablet Place 1 tablet under the tongue or swallow every 6 hours as needed for nausea. 07/13/16  Yes Lennis Marion Downer, MD  megestrol (MEGACE) 40 MG tablet Take 2 tablets (80 mg total) by mouth 2 (two) times daily. 07/13/16  Yes Lennis Marion Downer, MD  Multiple Vitamins-Minerals (CENTRUM SILVER PO) Take 1 capsule by mouth daily.    Yes Historical Provider, MD  polyethylene glycol (MIRALAX / GLYCOLAX) packet Take 17 g by mouth daily as needed for moderate constipation. Reported on 12/09/2015   Yes Historical Provider, MD  potassium chloride SA (K-DUR,KLOR-CON) 20 MEQ tablet Take 1 tablet (20 mEq total) by mouth daily. Patient taking differently: Take 20 mEq by mouth 2 (two) times a week.  11/09/15  Yes [name] Plotnikov, MD  RABEprazole (ACIPHEX) 20 MG tablet Take 1 tablet (20 mg total) by mouth 2 (two) times daily. Patient not taking: Reported on 07/17/2016 06/23/16   Cassandria Anger, MD   No Known  Allergies Review of Systems No abdominal pain No nausea 1 vomiting last night Had a BM earlier today  Physical Exam NAD Mild to moderate abdominal distension Trace edema S1 S2 Clear to auscultation bilaterally Awake alert non focal.  Vital Signs: BP 132/79 (BP Location: Right Arm)   Pulse 97   Temp 98.3 F (36.8 C) (Oral)   Resp 16   Ht 5\' 6"  (1.676 m)   Wt 65.2 kg (143 lb 12.8 oz)   SpO2 100%   BMI 23.21 kg/m  Pain Assessment: No/denies pain   Pain Score: 0-No pain   SpO2: SpO2: 100 % O2 Device:SpO2: 100 % O2 Flow Rate: .   IO: Intake/output summary:  Intake/Output Summary (Last 24 hours) at 07/18/16 1413 Last data filed at 07/17/16 1920  Gross per 24 hour  Intake             1555 ml  Output                0 ml  Net             1555 ml    LBM: Last BM Date: 07/17/16 Baseline Weight: Weight: 72.1 kg (159 lb) Most recent weight: Weight: 65.2 kg (143 lb 12.8 oz)     Palliative Assessment/Data:   Flowsheet Rows   Flowsheet Row Most Recent Value  Intake Tab  Referral Department  Hospitalist  Unit at Time of Referral  Oncology Unit  Palliative Care Primary Diagnosis  Cancer  Date Notified  07/17/16  Palliative Care Type  New Palliative care  Reason for referral  Clarify Goals of Care, Non-pain Symptom  Date of Admission  07/17/16  Date first seen by Palliative Care  07/18/16  # of days IP prior to Palliative referral  0  Clinical Assessment  Palliative Performance Scale Score  40%  Pain Max last 24 hours  5  Pain Min Last 24 hours  4  Dyspnea Max Last 24 Hours  4  Dyspnea Min Last 24 hours  3  Nausea Max Last 24 Hours  4  Nausea Min  Last 24 Hours  3  Psychosocial & Spiritual Assessment  Palliative Care Outcomes  Patient/Family meeting held?  Yes  Who was at the meeting?  patient who is decisional.   Palliative Care Outcomes  Clarified goals of care, Improved non-pain symptom therapy  Palliative Care follow-up planned  Yes, Home      Time In:   1300 Time Out:  1410 Time Total:  70 min  Greater than 50%  of this time was spent counseling and coordinating care related to the above assessment and plan.  Signed by: Loistine Chance, MD  [phone]  Please contact Palliative Medicine Team phone at [phone] for questions and concerns.  For individual provider: See Shea Evans

## 2016-07-18 NOTE — Progress Notes (Signed)
Initial Nutrition Assessment  DOCUMENTATION CODES:   Severe malnutrition in context of chronic illness  INTERVENTION:    Will increase Ensure Enlive oral nutrition supplement order to TID between meals. Each provides 350 kcal and 20 grams protein.  Provided brief education on high-kcal and high-protein food sources.  Encourage PO intake and high-protein foods.  Will continue to monitor for nutritional needs.  NUTRITION DIAGNOSIS:   Malnutrition related to chronic illness as evidenced by moderate depletion of body fat, severe depletion of muscle mass, percent weight loss of 13.3% in 3 months.  GOAL:   Patient will meet greater than or equal to 90% of their needs  MONITOR:   PO intake, Supplement acceptance, Diet advancement, Weight trends, I & O's  REASON FOR ASSESSMENT:   Malnutrition Screening Tool   ASSESSMENT:   75 year-old AA female with metastatic endometrial cancer s/p total robotic hysterectomy, BL salpingo oophorectomy, and pelvic lymphadenectomy (04/2015, Dr. Skeet Latch) who presented to [facility] with abdominal pain. She is followed closely by Dr. Marko Plume and Dr. Skeet Latch. States she was scheduled for a paracentesis tomorrow, however her abdominal discomfort became intolerable so she came to the hospital in hopes of having the procedure performed today. Pain rated as a 4/10 and located in upper abdomen. Patient reports vomiting yesterday after a greasy meal, but denies emesis since that time. Patient is having loose bowel movements daily and takes Miralax every evening. She is having flatus. She maintains a soft, low-fiber diet.  Pt is s/p paracentesis on 07/17/16 with 4.2 L amber-colored fluid removed.  Spoke with pt at bedside who reports having a poor appetite. Pt states she feels "gassy" when she eats but is trying to eat as much as she can. Encouraged pt to eat as much food from her tray as she is able. Pt also reports poor appetite PTA "for months." Per pt, she would  consume breakfast and dinner. A typical breakfast might include protein powder in Lactaid milk with a banana or grits, eggs, and bacon. A typical dinner might include chicken with mashed potatoes. Pt reports she may also consume two hotdogs "with all the fixings" prior to dinner if she is hungry.  Pt reports consuming Ensure oral nutrition supplements PTA. Pt states she usually consumes at least one per day, typically with breakfast. Encouraged pt to consume 2-3 supplements per day. Suggested pt consume one with breakfast, between meals, and in the evening. Pt states she sometimes brings an Ensure to her room when she goes to bed.  Pt reports purchasing and consuming protein powder supplement from local health store. Pt states she mixes protein powder in Lactaid milk as she does not tolerate regular milk. Encouraged pt to continue this practice and to try to drink a glass of milk with protein powder at lunch and/or dinner.  Pt reports hx of ongoing weight loss. Pt states her UBW prior to weight loss was 172# and that she last weighed this over 1 year ago. Per chart, pt has lost 22# in past 3 months (13.3%, significant for timeframe). Pt last saw [place] in 12/2015 and was losing weight at that time. [place] provided education regarding increasing meal frequency/snacks and consuming oral nutrition supplements.  Pt expresses desire to gain weight. Discussed with pt ways to add kcal to her diet (example: cooking mashed potatoes with whole milk and using butter). Also discussed high-protein foods pt enjoys such as beans, eggs, and chicken.  Medications reviewed and include ferrous fumarate 106 mg  every other day, Megace 80 mg BID, MVI with minerals, Protonix 40 mg daily, PRN Zofran, PRN Miralax  Labs reviewed and include low calcium (8.6 mg/dL), elevated glucose (105 mg/dL)  NFPE: Exam performed. Mild/moderate fat depletion, mild/moderate to severe muscle depletion, and no edema  noted.  Diet Order:  Diet full liquid Room service appropriate? Yes; Fluid consistency: Thin  Skin:  Reviewed, no issues (paracentesis surgical incision)  Last BM:  07/17/16  Height:   Ht Readings from Last 1 Encounters:  07/17/16 5\' 6"  (1.676 m)    Weight:   Wt Readings from Last 1 Encounters:  07/18/16 143 lb 12.8 oz (65.2 kg)    Ideal Body Weight:  59 kg  BMI:  Body mass index is 23.21 kg/m.  Estimated Nutritional Needs:   Kcal:  1800-2000 (28-30 kcal/kg)  Protein:  85-100 grams (1.3-1.5 g/kg)  Fluid:  1.6-1.8 L/day  EDUCATION NEEDS:   Education needs addressed  Jeb Levering Dietetic Intern Pager Number: [phone]

## 2016-07-18 NOTE — Progress Notes (Signed)
Discussed case with gyn oncologist this am.  We do not see any role for surgery.  If nausea and vomiting become a problem, would recommend palliative G tube in IR.  Will sign off.  Please call with any concerns.  Rosario Adie, MD  [facility] [place] [facility]

## 2016-07-19 ENCOUNTER — Telehealth: Payer: Self-pay | Admitting: Oncology

## 2016-07-19 ENCOUNTER — Telehealth: Payer: Self-pay | Admitting: *Deleted

## 2016-07-19 DIAGNOSIS — K56609 Unspecified intestinal obstruction, unspecified as to partial versus complete obstruction: Secondary | ICD-10-CM

## 2016-07-19 MED ORDER — OXYCODONE-ACETAMINOPHEN 5-325 MG PO TABS: 1.0000 | ORAL_TABLET | Freq: Four times a day (QID) | ORAL | 0 refills | Status: DC | PRN

## 2016-07-19 NOTE — Progress Notes (Signed)
Discharge instructions reviewed with patient. Patient verbalized understanding. 

## 2016-07-19 NOTE — Telephone Encounter (Signed)
"  This is Dr. Erlinda Hong calling for Dr,Livesay.  This patient needs an appointment."   Provider off today.  Next scheduled F/U is July 24, 2016.   "Can she be seen sooner?  Please message Dr. Marko Plume.  If she cannot be seen sooner, keep the appointment on October 9th."

## 2016-07-19 NOTE — Telephone Encounter (Signed)
Medical Oncology  Discussed with Dr Erlinda Hong, who is attending for [facility].  Patient is more comfortable since paracentesis 4 liters. She is tolerating liquids/ soft diet. Patient hopes to go home soon. She is to see me in office on 10-9. She has no immediate home nursing needs.  She would be appropriate for hospice if she and husband want that now. Dr Erlinda Hong will mention hospice to her, and I can follow up the discussion at visit early next week.  Godfrey Pick, MD

## 2016-07-19 NOTE — Discharge Summary (Signed)
Discharge Summary  Sharon Holt [id number] DOB: [birth year]  PCP: Walker Kehr, MD  Admit date: 07/17/2016 Discharge date: 07/19/2016  Time spent: >18mins, more than 505 time spent on coordination of care.  Recommendations for Outpatient Follow-up:  1. F/u with gyn onc Dr Marko Plume on 10/9  for hospital discharge follow up  Discharge Diagnoses:  Active Hospital Problems   Diagnosis Date Noted  . Small bowel obstruction 07/17/2016  . Dehydration 07/17/2016  . Partial small bowel obstruction 07/15/2016  . Protein calorie malnutrition ([place]) 09/27/2015  . GERD (gastroesophageal reflux disease) 09/17/2015  . History of rectal or anal cancer 09/14/2015  . Acute kidney injury ([place]) 09/07/2015  . 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 Metastatic disease ([place]) 09/06/2015  . Abdominal pain 09/06/2015  . Abdominal distension 09/06/2015  . Ascites 09/06/2015  . Constipation 08/26/2015  . Endometrial cancer ([place]) 04/20/2015  . Uterine cancer ([place]) 04/02/2015  . Essential hypertension 10/23/2007    Resolved Hospital Problems   Diagnosis Date Noted Date Resolved  No resolved problems to display.    Discharge Condition: stable  Diet recommendation: soft diet, small amount each time  [facility] Weights   07/17/16 1148 07/18/16 0527  Weight: 72.1 kg (159 lb) 65.2 kg (143 lb 12.8 oz)    History of present illness:  Chief Complaint: abdominal pain  HPI: Sharon Holt is a 75 y.o. female Patient with hx metastatic endometrial cancer, history of anal cancer, who presents with abdominal pain, distension, getting worse in the past couple weeks. Despite tx, has had progression of her disease. Had recent CT scan which showed a partial sbo, and ascites. Is due to have paracentesis tomorrow. Patient c/o nausea and vomiting. Denies bloody or bilious emesis. Had bm today, small. Denies fever or chills. No current iv chemotherapy, states she takes pills. Patient states is  taking her normal meds at home. She was scheduled to have a paracentesis today but ended up in the ED. She was noted to have a partial SBO. General surgery was consulted and recommended full liquid diet and paracentesis.  They did not feel that she would be a good surgical candidate given her active chemotherapy and malignancy. The patient has been having active discussions with her oncologist regarding goals of care.  Hospital Course:  Principal Problem:   Small bowel obstruction Active Problems:   Essential hypertension   Uterine cancer (HCC)   Endometrial cancer (HCC)   Constipation   Abdominal distension   Abdominal pain   Metastatic disease (HCC)   Ascites   Acute kidney injury ([place])   Peritoneal carcinomatosis (HCC)   History of rectal or anal cancer   GERD (gastroesophageal reflux disease)   Protein calorie malnutrition (HCC)   Partial small bowel obstruction   Dehydration   Partial Small bowel obstruction  In the setting of metastatic endometrial cancer -  No role for surgical intervention per surgery -Interventional radiology not able to put in  palliative G-tube placement due to ascites -Continue supportive care with IV fluids and analgesia, antiemetics as needed, patient seems feeling better, has been tolerating full liquid diet , advance diet to soft diet -I have discussed with dr Marko Plume over the phone, patient will be discharged home with close follow up with dr Marko Plume, please see below for details   Active Problems:   Uterine cancer [facility]) / Endometrial cancer ([name]) with peritoneal carcinomatosis , partial small bowel obstruction and ascites -  palliative care input appreticated,  "she wishes to be as well  as she can for as long as she can" - gyn onc Dr. Mariana Kaufman office contacted, she is off today on 10/4, she will be back to work on 10/5, will contact Dr Marko Plume tomorrow for further plan Addendum: Dr Marko Plume called back , agree with discharge patient home after  discuss hospice with patient. I have discussed with patient and her husband in room, I explained to them that hospice a service that focus on comfort and there is evidence shows that end stage cancer patient actually live longer with hospice service, they expressed understanding, they said they are not ready for hospice as of now, but will consider it and will continue to discuss with Dr Marko Plume about this. Patient request prn pain meds at time of discharge, I have provided her 10tabs of percocet q6hrs prn.    Acute kidney injury - Secondary to dehydration - Creatinine within normal limits, resolved with IV fluids    Abdominal distension - Ascites secondary to endometrial cancer  - 4.2 L fluid removed with paracentesis on 07/17/2016     Anemia of chronic disease - Secondary to history of malignancy - Hemoglobin 11.1      Severe protein calorie malnutrition  - In the context of chronic illness  - Continue nutritional supplementation  -patient report ensure cause diarrhea, ensure stopped,     Essential hypertension - Continue atenolol 75 mg daily  Diarrhea: patient report h/o lactose intolerance, no fever, no leukocytosis, no ab exposure, will hold off c diff testing, d/c ensure and observe No more diarrhea at time of discharge    DVT prophylaxis while in the hospital: Lovenox subcutaneous Code Status: DNR/DNI Family Communication: Family at the bedside Disposition Plan: home and close follow up with gyn onc Dr Marko Plume   Consultants:   Surgery  IR  Palliative care  gyn onc phone conversation with Dr Marko Plume  Procedures:   Paracentesis 10/2 - 4.2 L fluid removed   Antimicrobials:   None    Discharge Exam: BP (!) 141/80 (BP Location: Right Arm)   Pulse (!) 102   Temp 99.1 F (37.3 C) (Oral)   Resp 18   Ht 5\' 6"  (1.676 m)   Wt 65.2 kg (143 lb 12.8 oz)   SpO2 100%   BMI 23.21 kg/m   General: NAD Cardiovascular: RRR Respiratory:  CTABL Abdominal: distended, mild tender, no guarding   Discharge Instructions You were cared for by a hospitalist during your hospital stay. If you have any questions about your discharge medications or the care you received while you were in the hospital after you are discharged, you can call the unit and asked to speak with the hospitalist on call if the hospitalist that took care of you is not available. Once you are discharged, your primary care physician will handle any further medical issues. Please note that NO REFILLS for any discharge medications will be authorized once you are discharged, as it is imperative that you return to your primary care physician (or establish a relationship with a primary care physician if you do not have one) for your aftercare needs so that they can reassess your need for medications and monitor your lab values.  Discharge Instructions    Diet general    Complete by:  As directed    Soft diet, small amount each time   Increase activity slowly    Complete by:  As directed        Medication List    STOP taking these medications  RABEprazole 20 MG tablet Commonly known as:  ACIPHEX     TAKE these medications   atenolol 25 MG tablet Commonly known as:  TENORMIN take 3 tablets by mouth once daily What changed:  See the new instructions.   CENTRUM SILVER PO Take 1 capsule by mouth daily.   clorazepate 7.5 MG tablet Commonly known as:  TRANXENE Take 1 tablet (7.5 mg total) by mouth 2 (two) times daily as needed for anxiety.   feeding supplement (ENSURE ENLIVE) Liqd Take 237 mLs by mouth 2 (two) times daily between meals. What changed:  when to take this  reasons to take this   FERROCITE 324 (106 Fe) MG Tabs tablet Generic drug:  Ferrous Fumarate Take 1 tablet (106 mg of iron total) by mouth daily. What changed:  how much to take  when to take this   lidocaine-prilocaine cream Commonly known as:  EMLA Apply 1-2 hours prior to [facility]  Access as needed. What changed:  how much to take  how to take this  when to take this  reasons to take this  additional instructions   LORazepam 0.5 MG tablet Commonly known as:  ATIVAN Place 1 tablet under the tongue or swallow every 6 hours as needed for nausea.   megestrol 40 MG tablet Commonly known as:  MEGACE Take 2 tablets (80 mg total) by mouth 2 (two) times daily.   oxyCODONE-acetaminophen 5-325 MG tablet Commonly known as:  PERCOCET/ROXICET Take 1 tablet by mouth every 6 (six) hours as needed for severe pain.   polyethylene glycol packet Commonly known as:  MIRALAX / GLYCOLAX Take 17 g by mouth daily as needed for moderate constipation. Reported on 12/09/2015   potassium chloride SA 20 MEQ tablet Commonly known as:  K-DUR,KLOR-CON Take 1 tablet (20 mEq total) by mouth daily. What changed:  when to take this   Vitamin D3 2000 units capsule Take 2,000 Units by mouth daily.      No Known Allergies Follow-up Information    Evlyn Clines, MD Follow up on 07/24/2016.   Specialty:  Oncology Why:  call early if you experience worsening of symptom Contact information: [place] [address] [phone]            The results of significant diagnostics from this hospitalization (including imaging, microbiology, ancillary and laboratory) are listed below for reference.    Significant Diagnostic Studies: Ct Abdomen Pelvis W Contrast  Result Date: 07/10/2016 CLINICAL DATA:  Uterine cancer.  Chemotherapy ongoing. EXAM: CT ABDOMEN AND PELVIS WITH CONTRAST TECHNIQUE: Multidetector CT imaging of the abdomen and pelvis was performed using the standard protocol following bolus administration of intravenous contrast. CONTRAST:  122mL ISOVUE-300 IOPAMIDOL (ISOVUE-300) INJECTION 61% COMPARISON:  03/31/2016 FINDINGS: Lower chest: Clear lung bases. Borderline cardiomegaly, without pericardial or pleural effusion. Small hiatal hernia. Hepatobiliary:  Normal liver. The common duct is upper normal, including at 10 mm. No intrahepatic duct dilatation. No choledocholithiasis. Pancreas: Pancreatic atrophy, without pancreatic duct dilatation. Spleen: Subcentimeter low-density splenic lesion is unchanged and of doubtful significance. Adrenals/Urinary Tract: Normal adrenal glands. An upper pole left renal 6 mm cyst. Normal right kidney, without hydronephrosis. Normal urinary bladder. Stomach/Bowel: Proximal gastric underdistention. Normal colon, appendix, and terminal ileum. Mid small bowel loops are mildly dilated, including at 3.3 cm on image 67/series 2. A transition is identified to decompressed bowel in the distal ileum of the pelvis, including on image 75/series 2. The proximal most small bowel loops are normal in caliber. Vascular/Lymphatic: Aortic  and branch vessel atherosclerosis. No abdominopelvic adenopathy. Reproductive: Hysterectomy.  No adnexal mass. Other: Increase in moderate volume abdominal pelvic ascites. Re- demonstration of omental caking. This is subjectively increased, but difficult to quantify secondary to its diffuse relatively ill-defined morphology. Example omental thickening in the right-side of the abdomen at 10 mm on image 44/series 2 versus on the order of 5 mm previously (when remeasured). Suspect progressive peritoneal disease in the porta hepatis, including the gastrohepatic ligament on image 22/series 2. Right pelvic index implant measures 11 mm on image 71/series 2 and is similar at 10 mm previously. Cul-de-sac thickening and nodularity including on image 76/ series 2 persists. This is site of the small bowel transition. Fat containing left inguinal hernia. Musculoskeletal: Disc bulge at L2-3. IMPRESSION: 1. Mildly progressive disease. Increased moderate abdominal pelvic ascites with increase in omental/peritoneal metastasis. 2. Low-grade partial small bowel obstruction secondary to peritoneal metastasis in the pelvic cul-de-sac.  Transition in the distal ileum with mid small bowel mild dilatation. 3. Small hiatal hernia. 4.  Aortic atherosclerosis. Electronically Signed   By: Abigail Miyamoto M.D.   On: 07/10/2016 15:00   US Paracentesis  Result Date: 07/17/2016 INDICATION: History of endometrial cancer with ascites. Request is made for therapeutic paracentesis. EXAM: ULTRASOUND GUIDED THERAPEUTIC PARACENTESIS MEDICATIONS: 1% lidocaine. COMPLICATIONS: None immediate. PROCEDURE: Informed written consent was obtained from the patient after a discussion of the risks, benefits and alternatives to treatment. A timeout was performed prior to the initiation of the procedure. Initial ultrasound scanning demonstrates a moderate amount of ascites within the right lower abdominal quadrant. The right lower abdomen was prepped and draped in the usual sterile fashion. 1% lidocaine was used for local anesthesia. Following this, a 19 gauge, 7-cm, Yueh catheter was introduced. An ultrasound image was saved for documentation purposes. The paracentesis was performed. The catheter was removed and a dressing was applied. The patient tolerated the procedure well without immediate post procedural complication. FINDINGS: A total of approximately 4.2 L of amber fluid was removed. IMPRESSION: Successful ultrasound-guided paracentesis yielding 4.2 liters of peritoneal fluid. Read by: Saverio Danker, PA-C Electronically Signed   By: Aletta Edouard M.D.   On: 07/17/2016 16:24   Dg Abd Acute W/chest  Result Date: 07/17/2016 CLINICAL DATA:  75 year old female with abdominal pain and vomiting for 1 week. Metastatic endometrial cancer with ascites. Subsequent encounter. EXAM: DG ABDOMEN ACUTE W/ 1V CHEST COMPARISON:  Restaging CT Abdomen and Pelvis 07/10/2016, and earlier. FINDINGS: Right chest porta cath in place. Mediastinal contours are stable and within normal limits. Stable lung volumes. Mild eventration of the right hemidiaphragm. The lungs are clear. No  pneumothorax or pneumoperitoneum. No pulmonary nodule identified. Small bowel obstruction gas pattern, significantly worse than on the recent CT Abdomen and Pelvis. Dilated small bowel loops up to 5 cm. Paucity of large bowel gas. Mild gray hazy opacity throughout the abdomen in keeping with the ascites which was demonstrated recently. Stable cholecystectomy clips. No acute osseous abnormality identified. IMPRESSION: 1. Progressive and now moderate to severe small bowel obstruction since the recent restaging CT Abdomen and Pelvis on 07/10/2016. Distal small bowel involvement by peritoneal metastases on that exam. No free air. 2.  No acute cardiopulmonary abnormality. Electronically Signed   By: Genevie Ann M.D.   On: 07/17/2016 13:00    Microbiology: No results found for this or any previous visit (from the past 240 hour(s)).   Labs: Basic Metabolic Panel:  Recent Labs Lab 07/17/16 1220 07/18/16 0508  NA 132* 139  K 3.6 3.9  CL 99* 105  CO2 24 29  GLUCOSE 146* 105*  BUN 15 13  CREATININE 1.03* 0.95  CALCIUM 9.0 8.6*   Liver Function Tests:  Recent Labs Lab 07/17/16 1220 07/18/16 0508  AST 27 23  ALT 12* 10*  ALKPHOS 42 35*  BILITOT 0.8 0.8  PROT 7.2 6.1*  ALBUMIN 3.2* 2.8*   No results for input(s): LIPASE, AMYLASE in the last 168 hours. No results for input(s): AMMONIA in the last 168 hours. CBC:  Recent Labs Lab 07/17/16 1220 07/18/16 0508  WBC 5.9 4.7  HGB 12.4 11.1*  HCT 37.7 33.3*  MCV 91.3 91.7  PLT 279 237   Cardiac Enzymes: No results for input(s): CKTOTAL, CKMB, CKMBINDEX, TROPONINI in the last 168 hours. BNP: BNP (last 3 results) No results for input(s): BNP in the last 8760 hours.  ProBNP (last 3 results) No results for input(s): PROBNP in the last 8760 hours.  CBG: No results for input(s): GLUCAP in the last 168 hours.     SignedFlorencia Reasons MD, PhD  [facility] 07/19/2016, 3:56 PM

## 2016-07-19 NOTE — Telephone Encounter (Signed)
Dr. Marko Plume called Dr. Erlinda Hong earlier today.  See Telephone Encounter.

## 2016-07-19 NOTE — Progress Notes (Signed)
Patient ID: Sharon Holt, female   DOB: [birth year], 75 y.o.   MRN: [id number]  PROGRESS NOTE    DEFNE CAMBRIDGE  [id number] DOB: 09/19/1941 DOA: 07/17/2016  PCP: Walker Kehr, MD   Brief Narrative:  75 y.o. female  with past medical history significant for metastatic endometrial cancer, anal cancer, hypertension who presented to [facility] with ongoing abdominal pain, distention, intermittent nausea and vomiting.  Patient was hemodynamically stable on the admission. She had paracentesis 07/17/2016 with 4.2 L fluid removed. Abdominal x-ray showed progressive and moderate to severe small bowel obstruction since recent restaging CT. Patient was seen by surgery in consultation and they do not see any role for surgery at this time. Plan is to have palliative G-tube placement by interventional radiology, interventional radiology consulted.  Assessment & Plan:   Principal Problem:   Partial Small bowel obstruction  In the setting of metastatic endometrial cancer -  No role for surgical intervention per surgery -Interventional radiology not able to put in  palliative G-tube placement due to ascites -Continue supportive care with IV fluids and analgesia, antiemetics as needed, patient seems feeling better, has been tolerating full liquid diet , will advance diet to soft diet   Active Problems:   Uterine cancer ([place]) / Endometrial cancer ([place]) with peritoneal carcinomatosis , partial small bowel obstruction and ascites -  palliative care input appreticated,  "she wishes to be as well as she can for as long as she can" - gyn onc Dr. Mariana Kaufman office contacted, she is off today on 10/4, she will be back to work on 10/5, will contact Dr Marko Plume tomorrow for further plan    Acute kidney injury - Secondary to dehydration - Creatinine within normal limits, resolved with IV fluids    Abdominal distension - Ascites secondary to endometrial cancer  - 4.2 L fluid removed with  paracentesis on 07/17/2016     Anemia of chronic disease - Secondary to history of malignancy - Hemoglobin 11.1      Severe protein calorie malnutrition  - In the context of chronic illness  - Continue nutritional supplementation  -patient report ensure cause diarrhea, ensure stopped,     Essential hypertension - Continue atenolol 75 mg daily  Diarrhea: patient report h/o lactose intolerance, no fever, no leukocytosis, no ab exposure, will hold off c diff testing, d/c ensure and observe    DVT prophylaxis: Lovenox subcutaneous Code Status: DNR/DNI Family Communication: Family at the bedside Disposition Plan: We will let know patient's oncologist of patient's admission. Appreciate interventional radiology seen the patient in consultation for palliative G-tube placement   Consultants:   Surgery  IR  Palliative care  gyn onc  Procedures:   Paracentesis 10/2 - 4.2 L fluid removed   Antimicrobials:   None    Subjective: Reported watery diarrhea 10 times last night, states it is due to ensure, she report ab pain better, tolerating full liquid diet, no n/v, no fever  family at bedside  Objective: Vitals:   07/18/16 0527 07/18/16 1400 07/18/16 2208 07/19/16 0615  BP: 132/79 125/68 (!) 148/87 129/69  Pulse: 97 85 98 89  Resp: 16 18 18 16   Temp: 98.3 F (36.8 C) 99.3 F (37.4 C) 98.9 F (37.2 C) 99.1 F (37.3 C)  TempSrc: Oral Oral Oral Oral  SpO2: 100% 100% 100% 99%  Weight: 65.2 kg (143 lb 12.8 oz)     Height:        Intake/Output Summary (Last 24 hours) at 07/19/16  [place] filed at 07/19/16 0600  Gross per 24 hour  Intake             1065 ml  Output                0 ml  Net             1065 ml   Filed Weights   07/17/16 1148 07/18/16 0527  Weight: 72.1 kg (159 lb) 65.2 kg (143 lb 12.8 oz)    Examination:  General exam: Appears calm and comfortable  Respiratory system: Clear to auscultation. Respiratory effort normal. Cardiovascular  system: S1 & S2 heard, RRR. No JVD, murmurs, rubs, gallops or clicks. No pedal edema. Gastrointestinal system: Abdomen is softly distended, (+) BS Central nervous system: Alert and oriented. No focal neurological deficits. Extremities: Symmetric 5 x 5 power. Skin: No rashes, lesions or ulcers Psychiatry: Judgement and insight appear normal. Mood & affect appropriate.   Data Reviewed: I have personally reviewed following labs and imaging studies  CBC:  Recent Labs Lab 07/17/16 1220 07/18/16 0508  WBC 5.9 4.7  HGB 12.4 11.1*  HCT 37.7 33.3*  MCV 91.3 91.7  PLT 279 [id number]   Basic Metabolic Panel:  Recent Labs Lab 07/17/16 1220 07/18/16 0508  NA 132* 139  K 3.6 3.9  CL 99* 105  CO2 24 29  GLUCOSE 146* 105*  BUN 15 13  CREATININE 1.03* 0.95  CALCIUM 9.0 8.6*   GFR: Estimated Creatinine Clearance: 48.6 mL/min (by C-G formula based on SCr of 0.95 mg/dL). Liver Function Tests:  Recent Labs Lab 07/17/16 1220 07/18/16 0508  AST 27 23  ALT 12* 10*  ALKPHOS 42 35*  BILITOT 0.8 0.8  PROT 7.2 6.1*  ALBUMIN 3.2* 2.8*   No results for input(s): LIPASE, AMYLASE in the last 168 hours. No results for input(s): AMMONIA in the last 168 hours. Coagulation Profile: No results for input(s): INR, PROTIME in the last 168 hours. Cardiac Enzymes: No results for input(s): CKTOTAL, CKMB, CKMBINDEX, TROPONINI in the last 168 hours. BNP (last 3 results) No results for input(s): PROBNP in the last 8760 hours. HbA1C: No results for input(s): HGBA1C in the last 72 hours. CBG: No results for input(s): GLUCAP in the last 168 hours. Lipid Profile: No results for input(s): CHOL, HDL, LDLCALC, TRIG, CHOLHDL, LDLDIRECT in the last 72 hours. Thyroid Function Tests: No results for input(s): TSH, T4TOTAL, FREET4, T3FREE, THYROIDAB in the last 72 hours. Anemia Panel: No results for input(s): VITAMINB12, FOLATE, FERRITIN, TIBC, IRON, RETICCTPCT in the last 72 hours. Urine analysis:    Component  Value Date/Time   COLORURINE YELLOW 09/27/2015 1944   APPEARANCEUR CLEAR 09/27/2015 1944   LABSPEC 1.006 09/27/2015 1944   PHURINE 7.0 09/27/2015 1944   GLUCOSEU NEGATIVE 09/27/2015 1944   GLUCOSEU NEGATIVE 08/09/2015 1110   HGBUR NEGATIVE 09/27/2015 1944   BILIRUBINUR NEGATIVE 09/27/2015 1944   KETONESUR NEGATIVE 09/27/2015 1944   PROTEINUR NEGATIVE 09/27/2015 1944   UROBILINOGEN 0.2 08/09/2015 1110   NITRITE NEGATIVE 09/27/2015 1944   LEUKOCYTESUR NEGATIVE 09/27/2015 1944   Sepsis Labs: @LABRCNTIP (procalcitonin:4,lacticidven:4)   )No results found for this or any previous visit (from the past 240 hour(s)).    Radiology Studies: US Paracentesis Result Date: 07/17/2016 Successful ultrasound-guided paracentesis yielding 4.2 liters of peritoneal fluid.   Dg Abd Acute W/chest Result Date: 07/17/2016  1. Progressive and now moderate to severe small bowel obstruction since the recent restaging CT Abdomen and Pelvis on 07/10/2016. Distal small  bowel involvement by peritoneal metastases on that exam. No free air. 2.  No acute cardiopulmonary abnormality.   Scheduled Meds: . atenolol  75 mg Oral Daily  . enoxaparin (LOVENOX) injection  40 mg Subcutaneous Q24H  . Ferrous Fumarate  1 tablet Oral QODAY  . megestrol  80 mg Oral BID  . multivitamin with minerals  1 tablet Oral Daily  . pantoprazole  40 mg Oral Q0600   Continuous Infusions: . sodium chloride 75 mL/hr at 07/19/16 0600     LOS: 1 day    Time spent: 35 minutes  Greater than 50% of the time spent on counseling and coordinating the care.   Florencia Reasons, MD PhD [facility] Pager [phone]  If 7PM-7AM, please contact night-coverage [email or web address] Password [email or web address] 07/19/2016, 10:08 AM

## 2016-07-21 ENCOUNTER — Telehealth: Payer: Self-pay

## 2016-07-21 ENCOUNTER — Ambulatory Visit: Payer: Commercial Managed Care - HMO | Admitting: Internal Medicine

## 2016-07-21 NOTE — Telephone Encounter (Signed)
TCM list. To follow up with [facility] oncology

## 2016-07-23 ENCOUNTER — Other Ambulatory Visit: Payer: Self-pay | Admitting: Oncology

## 2016-07-23 DIAGNOSIS — C541 Malignant neoplasm of endometrium: Secondary | ICD-10-CM

## 2016-07-24 ENCOUNTER — Other Ambulatory Visit: Payer: Self-pay | Admitting: Oncology

## 2016-07-24 ENCOUNTER — Encounter: Payer: Self-pay | Admitting: Oncology

## 2016-07-24 ENCOUNTER — Ambulatory Visit (HOSPITAL_BASED_OUTPATIENT_CLINIC_OR_DEPARTMENT_OTHER): Payer: Commercial Managed Care - HMO | Admitting: Oncology

## 2016-07-24 ENCOUNTER — Other Ambulatory Visit (HOSPITAL_BASED_OUTPATIENT_CLINIC_OR_DEPARTMENT_OTHER): Payer: Commercial Managed Care - HMO

## 2016-07-24 VITALS — BP 142/84 | HR 130 | Temp 97.6°F | Resp 18 | Ht 66.0 in | Wt 153.6 lb

## 2016-07-24 DIAGNOSIS — C786 Secondary malignant neoplasm of retroperitoneum and peritoneum: Secondary | ICD-10-CM

## 2016-07-24 DIAGNOSIS — D6481 Anemia due to antineoplastic chemotherapy: Secondary | ICD-10-CM

## 2016-07-24 DIAGNOSIS — Z95828 Presence of other vascular implants and grafts: Secondary | ICD-10-CM

## 2016-07-24 DIAGNOSIS — K56600 Partial intestinal obstruction, unspecified as to cause: Secondary | ICD-10-CM

## 2016-07-24 DIAGNOSIS — N189 Chronic kidney disease, unspecified: Secondary | ICD-10-CM

## 2016-07-24 DIAGNOSIS — R180 Malignant ascites: Secondary | ICD-10-CM

## 2016-07-24 DIAGNOSIS — D631 Anemia in chronic kidney disease: Secondary | ICD-10-CM | POA: Diagnosis not present

## 2016-07-24 DIAGNOSIS — C801 Malignant (primary) neoplasm, unspecified: Principal | ICD-10-CM

## 2016-07-24 DIAGNOSIS — C541 Malignant neoplasm of endometrium: Secondary | ICD-10-CM

## 2016-07-24 DIAGNOSIS — K219 Gastro-esophageal reflux disease without esophagitis: Secondary | ICD-10-CM

## 2016-07-24 DIAGNOSIS — R859 Unspecified abnormal finding in specimens from digestive organs and abdominal cavity: Secondary | ICD-10-CM

## 2016-07-24 LAB — CBC WITH DIFFERENTIAL/PLATELET
BASO%: 0.1 % (ref 0.0–2.0)
BASOS ABS: 0 10*3/uL (ref 0.0–0.1)
EOS%: 0.2 % (ref 0.0–7.0)
Eosinophils Absolute: 0 10*3/uL (ref 0.0–0.5)
HCT: 38.9 % (ref 34.8–46.6)
HEMOGLOBIN: 12.6 g/dL (ref 11.6–15.9)
LYMPH#: 0.8 10*3/uL — AB (ref 0.9–3.3)
LYMPH%: 10.7 % — ABNORMAL LOW (ref 14.0–49.7)
MCH: 29.8 pg (ref 25.1–34.0)
MCHC: 32.5 g/dL (ref 31.5–36.0)
MCV: 91.6 fL (ref 79.5–101.0)
MONO#: 0.9 10*3/uL (ref 0.1–0.9)
MONO%: 12 % (ref 0.0–14.0)
NEUT#: 5.7 10*3/uL (ref 1.5–6.5)
NEUT%: 77 % — AB (ref 38.4–76.8)
Platelets: 321 10*3/uL (ref 145–400)
RBC: 4.24 10*6/uL (ref 3.70–5.45)
RDW: 14.7 % — AB (ref 11.2–14.5)
WBC: 7.4 10*3/uL (ref 3.9–10.3)

## 2016-07-24 LAB — BASIC METABOLIC PANEL
ANION GAP: 13 meq/L — AB (ref 3–11)
BUN: 22.7 mg/dL (ref 7.0–26.0)
CALCIUM: 8.9 mg/dL (ref 8.4–10.4)
CO2: 24 mEq/L (ref 22–29)
Chloride: 99 mEq/L (ref 98–109)
Creatinine: 1.1 mg/dL (ref 0.6–1.1)
EGFR: 58 mL/min/{1.73_m2} — AB (ref >90)
Glucose: 136 mg/dl (ref 70–140)
POTASSIUM: 3.5 meq/L (ref 3.5–5.1)
SODIUM: 137 meq/L (ref 136–145)

## 2016-07-24 NOTE — Progress Notes (Signed)
OFFICE PROGRESS NOTE   July 25, 2016   Physicians: W.Brewster/ Emma Rossi,Plotnikov, Georgina Quint, MD, Antony Blackbird, [facility] Jarold Motto), [name]  INTERVAL HISTORY:   Patient is seen, together with husband, son and daughter in law, in continuing attention to progressive high grade endometrial carcinoma which is refractory to [place] taxol and did not respond to doxil. She began Megace on 07-13-16, however situation is worsening.  Patient was scheduled for outpatient paracentesis by IR on 07-18-16, however was admitted 10-2 thru 10-4 with abdominal pain, partial SBO and symptomatic ascites. She had US paracentesis on 10-2 for 4.2 liters ascites, with improvement in symptoms and in abdominal distension. She was seen for partial SBO in hospital by Dr Romie Levee in consultation, surgery not recommended and patient was tolerating soft foods by time of discharge. She was seen in consultation by Palliative Medicine. She was not appropriate for PEG given ascites.  Over past few days since hospital DC,  she has had intermittent vomiting including brownish liquid, GERD which improved with family member's protonix x 1, increased abdominal distension, and little po intake. She had good BM this morning, bowels have been moving every 2-3 days with miralax and prune juice. She has "gas pains" but no continuous pain. She tried Percocet x 1, felt that this caused tachycardia and has used no other pain medication. She denies SOB with present activity. She denies bleeding. She slept well last pm. Energy is poor. No fever. No problems with PAC.  Remainder of 10 point Review of Systems negative/ unchanged.    PAC  No genetics testing. MMR normal on surgical path 04-2015  CA 125 09-14-15 2427.  ER PR negative surgical path 04-20-15 ([id number]) High dose flu vaccine done 06-23-16 Code status DNR  ONCOLOGIC HISTORY Patient had regular medical care by PCP Dr Posey Rea and gyn Dr Cherly Hensen. In spring  2016, she noticed low pelvic discomfort and some intermittent vaginal spotting. She was seen for regular visit by Dr Cherly Hensen in May 2016, with PAP positive for AGUS and US showing endometrial stripe 8.6mm. Endometrial biopsy 03-12-15 showed high grade adenocarcinoma with papillary features, however immunostains were not typical for serous carcinoma. She was seen by Dr Andrey Farmer in June. CT CAP 04-14-15 showed no evidence of metastatic disease in chest, a 3.3 x 2.8 cm endometrial mas and 3.1 cm cystic lesion of right ovary. She had robotic total laparoscopic hysterectomy BSO with bilateral pelvic and para aortic node dissection by Dr Nelly Rout at [facility] on 04-20-15. Pathology [phone]) grade 2 endometrioid adenocarcinoma invading 0.35 cm where myometrium 1.1 cm, 26 nodes negative and no LVSI. She had intracavitary radiation due to risk for vaginal cuff recurrence from 06-01-15 thru 07-01-15, vaginal cuff 27.5 gray in 5 fractions. There were no obvious concerns when she was seen by Dr Andrey Farmer in late July or Dr Roselind Messier and Dr Posey Rea in October. She presented to ED on 09-06-15 with generalized abdominal pain, bloating and constipation x 1 month. CT AP 09-06-15 showed extensive nodularity and stranding thruout omentum and large volume ascites, with liver/spleen/pancreas/adrenals not remarkable, no bowel obstruction. CA 125 was 2427. She had [place] paracentesi on 09-07-15 for 5 liters of serous fluid, cytology ([id number]) adenocarcinoma. She had consultation with Dr Andrey Farmer on 09-13-15, recommendation for systemic chemotherapy. Review of cytology from ascites compared with surgical specimen and with the initial endometrial biopsy read at [facility] shows the same high grade features that were seen on the initial endometrial biopsy (not seen on the full surgical path);  ER negative. She had repeat US paracentesis for 5.2 liters on 09-20-15. She had first carboplatin taxol on 09-21-15 and was neutropenic by day 8 cycle 1, granix  begun. She had cycle 2 at same doses (carbo AUC = 3.5) 0n 10-13-15 with neulasta, again neutropenic such that doses were adjusted subsequently. CT AP 01-12-16 after cycle 6 showed improvement, tho still evidence of disease; CA 125 was 683 with cycle 6 chemo. She received additional 3 cycles thru cycle 9 on 03-07-16, with progressive peripheral neuropathy despite decrease in taxol dose, and increase in CA 125 to 713 on 03-20-16. CT AP 03-31-16 confirmed progression of omental and peritoneal involvement and some increase in ascites. She began doxil on 04-10-16, no response after 3 cycles thru 06-05-16. CT AP 07-10-16 showed partial mid SBO, progression including increased omental caking and some ascites. After discussion, she did not want to attempt further chemotherapy, did begin megace 07-13-16. She was hospitalized 10-2 thru 10-4 with 4.2 liter ascites removed, at least partial SBO.   Objective:  Vital signs in last 24 hours:  BP (!) 142/84 (BP Location: Left Arm, Patient Position: Sitting)   Pulse (!) 130   Temp 97.6 F (36.4 C) (Oral)   Resp 18   Ht '5\' 6"'$  (1.676 m)   Wt 153 lb 9.6 oz (69.7 kg)   SpO2 99%   BMI 24.79 kg/m  Weight down 6 lbs Alert, oriented and appropriate. Ambulatory. Looks mildly uncomfortable from abdomen, respirations not labored RA, looks generally more weak HEENT:PERRL, sclerae not icteric. Oral mucosa moist without lesions, posterior pharynx clear. Marland Kitchen No JVD.  Lymphatics:no supraclavicular adenopathy Resp: clear to auscultation bilaterally and normal percussion bilaterally Cardio: tachy, regular rate and rhythm. No gallop. GI:  Markedly distended but soft and not quite tight. Hyperactive bowel sounds.   Musculoskeletal/ Extremities: without pitting edema, cords, tenderness Neuro: speech fluent,  Nonfocal. PSYCH appropriate affect Skin without rash, ecchymosis, petechiae Portacath-without erythema or tenderness  Lab Results:  Results for orders placed or performed in  visit on 07/24/16  CBC with Differential  Result Value Ref Range   WBC 7.4 3.9 - 10.3 10e3/uL   NEUT# 5.7 1.5 - 6.5 10e3/uL   HGB 12.6 11.6 - 15.9 g/dL   HCT 38.9 34.8 - 46.6 %   Platelets 321 145 - 400 10e3/uL   MCV 91.6 79.5 - 101.0 fL   MCH 29.8 25.1 - 34.0 pg   MCHC 32.5 31.5 - 36.0 g/dL   RBC 4.24 3.70 - 5.45 10e6/uL   RDW 14.7 (H) 11.2 - 14.5 %   lymph# 0.8 (L) 0.9 - 3.3 10e3/uL   MONO# 0.9 0.1 - 0.9 10e3/uL   Eosinophils Absolute 0.0 0.0 - 0.5 10e3/uL   Basophils Absolute 0.0 0.0 - 0.1 10e3/uL   NEUT% 77.0 (H) 38.4 - 76.8 %   LYMPH% 10.7 (L) 14.0 - 49.7 %   MONO% 12.0 0.0 - 14.0 %   EOS% 0.2 0.0 - 7.0 %   BASO% 0.1 0.0 - 2.0 %  Basic metabolic panel  Result Value Ref Range   Sodium 137 136 - 145 mEq/L   Potassium 3.5 3.5 - 5.1 mEq/L   Chloride 99 98 - 109 mEq/L   CO2 24 22 - 29 mEq/L   Glucose 136 70 - 140 mg/dl   BUN 22.7 7.0 - 26.0 mg/dL   Creatinine 1.1 0.6 - 1.1 mg/dL   Calcium 8.9 8.4 - 10.4 mg/dL   Anion Gap 13 (H) 3 - 11 mEq/L  EGFR 58 (L) >90 ml/min/1.73 m2     Studies/Results:  Imaging done in hospital reviewed by this MD  Medications: I have reviewed the patient's current medications. Resume Aciphex that she has available at home. Will ask hospice to have liquid pain medication available. Continue miralax if that is helping.  DISCUSSION Rapidly worsening with at least partial bowel obstruction and recent large volume ascites which may be reoccurring.  Not candidate for bypass of the bowel obstruction. If ascites has reoccurred this quickly, would be best to use peritoneal drain to manage at home. I have spoken directly with IR at [facility] and [facility], earliest appointment available is 10-11 AM at [facility]. We have discussed situation at home; I have encouraged Hospice referral, as I do not expect we can keep her adequately comfortable at home without this. Hospice will also be able to manage peritoneal drain if that is placed. Daughter in law, who is extremely helpful  and knowledgeable during visit today, is in full agreement with Hospice; by completion of discussion, patient and other family also agree. Son and daughter in law understand that she likely has only a few weeks at most. [facility] may be an appropriate option. I have encouraged liquids only.  I have spoken directly with Hospice intake staff. They will try to see patient this afternoon.    Assessment/Plan: 1. 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 Carbo taxol x 9 cycles from 09-21-15 thru 03-07-16, then doxil x 3 from 04-10-16 thru 06-05-16. More symptomatic with at least partial bowel obstruction and large volume ascites (4+ liter paracentesis 07-18-16). Peritoneal drain to be placed by IR on 10-11 if sufficient fluid. Hospice referral now. Can continue megace if helps appetite, otherwise ok to stop. She has follow up with Dr Skeet Latch on  08-03-16, which she may not need to keep depending on situation with Hospice.  2. At least partial mid SBO by imaging last week. General surgery did not recommend bypass or diverting colostomy. Intermittent vomiting, bowels moved today. 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 Code status confirmed DNR during recent hospitalization   All questions  answered; with patient's consent, I have spoken with son and daughter in law separately also. We appreciate Hospice seeing her so promptly. Family understands that they can call here or Hospice at any time. I will let Dr Skeet Latch know situation; cc also Dr Alain Marion. Time spent 20 min including >50% counseling and coordination of care.     Evlyn Clines, MD   07/25/2016, 9:01 AM

## 2016-07-25 ENCOUNTER — Other Ambulatory Visit: Payer: Self-pay | Admitting: Radiology

## 2016-07-26 ENCOUNTER — Ambulatory Visit (HOSPITAL_COMMUNITY)
Admission: RE | Admit: 2016-07-26 | Discharge: 2016-07-26 | Disposition: A | Discharge status: Home / Self Care | Source: Ambulatory Visit | Attending: Oncology | Admitting: Oncology

## 2016-07-26 ENCOUNTER — Other Ambulatory Visit: Payer: Self-pay | Admitting: Oncology

## 2016-07-26 ENCOUNTER — Encounter (HOSPITAL_COMMUNITY): Payer: Self-pay

## 2016-07-26 DIAGNOSIS — R859 Unspecified abnormal finding in specimens from digestive organs and abdominal cavity: Secondary | ICD-10-CM

## 2016-07-26 DIAGNOSIS — Z803 Family history of malignant neoplasm of breast: Secondary | ICD-10-CM | POA: Insufficient documentation

## 2016-07-26 DIAGNOSIS — R180 Malignant ascites: Secondary | ICD-10-CM | POA: Diagnosis present

## 2016-07-26 DIAGNOSIS — Z87891 Personal history of nicotine dependence: Secondary | ICD-10-CM | POA: Insufficient documentation

## 2016-07-26 DIAGNOSIS — Z8042 Family history of malignant neoplasm of prostate: Secondary | ICD-10-CM | POA: Insufficient documentation

## 2016-07-26 DIAGNOSIS — Z85048 Personal history of other malignant neoplasm of rectum, rectosigmoid junction, and anus: Secondary | ICD-10-CM | POA: Diagnosis not present

## 2016-07-26 DIAGNOSIS — D630 Anemia in neoplastic disease: Secondary | ICD-10-CM | POA: Diagnosis not present

## 2016-07-26 DIAGNOSIS — I10 Essential (primary) hypertension: Secondary | ICD-10-CM | POA: Diagnosis not present

## 2016-07-26 DIAGNOSIS — C541 Malignant neoplasm of endometrium: Secondary | ICD-10-CM | POA: Diagnosis not present

## 2016-07-26 DIAGNOSIS — E785 Hyperlipidemia, unspecified: Secondary | ICD-10-CM | POA: Insufficient documentation

## 2016-07-26 DIAGNOSIS — Z823 Family history of stroke: Secondary | ICD-10-CM | POA: Insufficient documentation

## 2016-07-26 DIAGNOSIS — Z923 Personal history of irradiation: Secondary | ICD-10-CM | POA: Insufficient documentation

## 2016-07-26 HISTORY — PX: IR GENERIC HISTORICAL: IMG1180011

## 2016-07-26 LAB — BASIC METABOLIC PANEL
Anion gap: 12 (ref 5–15)
BUN: 23 mg/dL — ABNORMAL HIGH (ref 6–20)
CALCIUM: 8.9 mg/dL (ref 8.9–10.3)
CO2: 22 mmol/L (ref 22–32)
CREATININE: 1.11 mg/dL — AB (ref 0.44–1.00)
Chloride: 101 mmol/L (ref 101–111)
GFR calc non Af Amer: 48 mL/min — ABNORMAL LOW (ref >60)
GFR, EST AFRICAN AMERICAN: 55 mL/min — AB (ref >60)
Glucose, Bld: 110 mg/dL — ABNORMAL HIGH (ref 65–99)
Potassium: 3.6 mmol/L (ref 3.5–5.1)
SODIUM: 135 mmol/L (ref 135–145)

## 2016-07-26 LAB — CBC WITH DIFFERENTIAL/PLATELET
BASOS PCT: 0 %
Basophils Absolute: 0 10*3/uL (ref 0.0–0.1)
EOS ABS: 0 10*3/uL (ref 0.0–0.7)
Eosinophils Relative: 0 %
HCT: 35.8 % — ABNORMAL LOW (ref 36.0–46.0)
HEMOGLOBIN: 12.4 g/dL (ref 12.0–15.0)
Lymphocytes Relative: 17 %
Lymphs Abs: 1.5 10*3/uL (ref 0.7–4.0)
MCH: 30.5 pg (ref 26.0–34.0)
MCHC: 34.6 g/dL (ref 30.0–36.0)
MCV: 88.2 fL (ref 78.0–100.0)
MONOS PCT: 10 %
Monocytes Absolute: 0.9 10*3/uL (ref 0.1–1.0)
NEUTROS PCT: 73 %
Neutro Abs: 6.5 10*3/uL (ref 1.7–7.7)
Platelets: 288 10*3/uL (ref 150–400)
RBC: 4.06 MIL/uL (ref 3.87–5.11)
RDW: 14.3 % (ref 11.5–15.5)
WBC: 8.9 10*3/uL (ref 4.0–10.5)

## 2016-07-26 LAB — PROTIME-INR
INR: 1.14
PROTHROMBIN TIME: 14.6 s (ref 11.4–15.2)

## 2016-07-26 MED ORDER — SODIUM CHLORIDE 0.9 % IV SOLN: INTRAVENOUS | Status: DC

## 2016-07-26 MED ORDER — ONDANSETRON HCL 4 MG/2ML IJ SOLN
4.0000 mg | Freq: Once | INTRAMUSCULAR | Status: AC
  Administered 2016-07-26: 4 mg via INTRAVENOUS

## 2016-07-26 MED ORDER — CEFAZOLIN SODIUM-DEXTROSE 2-4 GM/100ML-% IV SOLN
2.0000 g | INTRAVENOUS | Status: AC
  Administered 2016-07-26: 2 g via INTRAVENOUS

## 2016-07-26 MED ORDER — FENTANYL CITRATE (PF) 100 MCG/2ML IJ SOLN
INTRAMUSCULAR | Status: AC
  Filled 2016-07-26: qty 2

## 2016-07-26 MED ORDER — FENTANYL CITRATE (PF) 100 MCG/2ML IJ SOLN
INTRAMUSCULAR | Status: AC | PRN
  Administered 2016-07-26: 50 ug via INTRAVENOUS
  Administered 2016-07-26 (×2): 25 ug via INTRAVENOUS

## 2016-07-26 MED ORDER — CEFAZOLIN SODIUM-DEXTROSE 2-4 GM/100ML-% IV SOLN
INTRAVENOUS | Status: AC
  Filled 2016-07-26: qty 100

## 2016-07-26 MED ORDER — ONDANSETRON HCL 4 MG/2ML IJ SOLN
INTRAMUSCULAR | Status: AC
  Filled 2016-07-26: qty 2

## 2016-07-26 MED ORDER — MIDAZOLAM HCL 2 MG/2ML IJ SOLN
INTRAMUSCULAR | Status: AC
  Filled 2016-07-26: qty 2

## 2016-07-26 MED ORDER — MIDAZOLAM HCL 2 MG/2ML IJ SOLN
INTRAMUSCULAR | Status: AC | PRN
  Administered 2016-07-26 (×2): 0.5 mg via INTRAVENOUS
  Administered 2016-07-26: 1 mg via INTRAVENOUS

## 2016-07-26 NOTE — Sedation Documentation (Signed)
22500 straw colored fluid drained from abdomen

## 2016-07-26 NOTE — Discharge Instructions (Signed)
Ascites Drainage Catheter Home Guide An ascites drainage catheter is a thin, flexible tube that helps to drain excess fluid that has collected in the abdomen. Draining the fluid can help to prevent pain and other problems from developing. There are several kinds of ascites drainage systems. Your health care provider will teach you how to use your system. He or she will also give you directions about how much fluid to drain and how often to drain it. Follow these general guidelines as well as the instructions that you receive about your specific drainage system. Call your health care provider if you have any questions or concerns. HOW DO I USE THE CATHETER TO DRAIN FLUID? 1. Clean any surface that you will be using. 2. Wash your hands with soap and warm water. If your hands are not visibly dirty, you can use an alcohol-based skin cleaner instead. 3. Put on a mask. Make sure that it covers your nose and your mouth. 4. Within reach on the surfaces that you have cleaned, place all of the supplies that you will need. 5. Clean the safety valve with an alcohol wipe. 6. Attach the adapter or access tip to the safety valve on your catheter. To do this, you may need to remove a protective cap on the safety valve. 7. Release the clamps on your drainage container. 8. Allow the fluid to drain into the container. 9. Close the clamps and release the catheter from the drainage container. 10. Write down the amount of fluid that was drained, if your health care provider has directed you to do that. 11. Follow instructions from your health care provider about storing and transporting the fluid if you are directed to bring the fluid to your health care provider's office. If you are told to dispose of the fluid, pour it down the toilet or do as directed by your health care provider. 12. With an alcohol-based or bleach-based cleaner, clean the surfaces where any fluid was spilled. WHAT SHOULD I DO AFTER I DRAIN THE  FLUID? 1. Wipe the end of your catheter with an antiseptic solution, such as rubbing alcohol. 2. Put the protective cap back onto the safety valve. 3. Place gauze bandages (dressings) around the catheter site as directed by your health care provider. 4. Secure the catheter to your body with gauze and dressings as directed by your health care provider. SEEK MEDICAL CARE IF:  You have skin irritation, redness, or pain where the catheter was inserted (insertion site).  You have trouble draining fluid.  You have trouble caring for your catheter.  You have abdominal pain, bloating, or cramping. SEEK IMMEDIATE MEDICAL CARE IF:  You develop a fever or chills.  You have increasing redness or increasing pain around the insertion site.  You have blood or pus coming from the insertion site.  Fluid that drains out of the catheter becomes cloudy or has a bad smell.  You have abdominal pain or cramping that worsens or does not go away.   This information is not intended to replace advice given to you by your health care provider. Make sure you discuss any questions you have with your health care provider.   Document Released: 09/21/2011 Document Revised: 02/16/2015 Document Reviewed: 09/28/2014 Elsevier Interactive Patient Education [employer].

## 2016-07-26 NOTE — Procedures (Signed)
RLQ pleurex drain No comp/EBL

## 2016-07-26 NOTE — H&P (Signed)
Chief Complaint: Recurrent malignant ascites  Referring Physician(s): Livesay,Lennis P  Supervising Physician: Marybelle Killings  Patient Status: Outpatient  History of Present Illness: Sharon Holt is a 75 y.o. female with history of metastatic endometrial cancer and anal cancer who has abdominal pain and distension secondary to recurrent malignant ascites.  Despite treatment she has had progression of her disease.  She has had paracentesis x 5.  She has requested a tunneled peritoneal catheter so she can manage her ascites at home.  She is NPO. She does not take blood thinners. She denies recent fever/chills.  Past Medical History:  Diagnosis Date  . Anemia, unspecified   . Anxiety state, unspecified   . Chronic hepatitis, unspecified    pt thinks type B  . Endometrial cancer ([place]) 04/2015   serous adenocarcinoma  . Endometrial cancer, FIGO stage IVB ([place]) 07/05/2016  . History of brachytherapy 06/01/15, 06/10/15, 06/17/15, 06/24/15, 07/01/15   vaginal cuff 27.5 gray  . Malignant neoplasm of anal canal ([place]) 15 yrs ago   treated with radiation   . Mitral valve disorders(424.0)   . Osteoporosis   . Other abnormal glucose   . Other and unspecified hyperlipidemia   . Transfusion history    "back in my 20's"  . Unspecified essential hypertension     Past Surgical History:  Procedure Laterality Date  . BREAST LUMPECTOMY     right breast BENIGN  . CHOLECYSTECTOMY    . ROBOTIC ASSISTED TOTAL HYSTERECTOMY WITH BILATERAL SALPINGO OOPHERECTOMY Bilateral 04/20/2015   Procedure: ROBOTIC ASSISTED TOTAL HYSTERECTOMY WITH BILATERAL SALPINGO OOPHORECTOMY AND PELVIC AND PERIAORTIC LYMPHADECTOMY;  Surgeon: Janie Morning, MD;  Location: [facility];  Service: Gynecology;  Laterality: Bilateral;  . TONSILLECTOMY      Allergies: Review of patient's allergies indicates no known allergies.  Medications: Prior to Admission medications   Medication Sig Start Date End Date Taking?  Authorizing Provider  atenolol (TENORMIN) 25 MG tablet take 3 tablets by mouth once daily Patient taking differently: take 75mg  (3 tablets) by mouth once daily 06/27/16  Yes Cassandria Anger, MD  Cholecalciferol (VITAMIN D3) 2000 UNITS capsule Take 2,000 Units by mouth daily.    Yes Historical Provider, MD  clorazepate (TRANXENE) 7.5 MG tablet Take 1 tablet (7.5 mg total) by mouth 2 (two) times daily as needed for anxiety. 02/08/16  Yes [name] Plotnikov, MD  FERROCITE 324 MG TABS tablet Take 1 tablet (106 mg of iron total) by mouth daily. Patient taking differently: Take 1 tablet by mouth every other day.  04/12/16  Yes Lennis Marion Downer, MD  LORazepam (ATIVAN) 0.5 MG tablet Place 1 tablet under the tongue or swallow every 6 hours as needed for nausea. 07/13/16  Yes Lennis Marion Downer, MD  megestrol (MEGACE) 40 MG tablet Take 2 tablets (80 mg total) by mouth 2 (two) times daily. 07/13/16  Yes Lennis Marion Downer, MD  RABEprazole (ACIPHEX) 20 MG tablet Take 20 mg by mouth 2 (two) times daily. 07/10/16  Yes Historical Provider, MD  lidocaine-prilocaine (EMLA) cream Apply 1-2 hours prior to [facility] Access as needed. Patient taking differently: Apply 1 application topically daily as needed (port access).  04/03/16   Lennis Marion Downer, MD  Multiple Vitamins-Minerals (CENTRUM SILVER PO) Take 1 capsule by mouth daily.     Historical Provider, MD  polyethylene glycol (MIRALAX / GLYCOLAX) packet Take 17 g by mouth daily as needed for moderate constipation. Reported on 12/09/2015    Historical Provider, MD  potassium chloride SA (  K-DUR,KLOR-CON) 20 MEQ tablet Take 1 tablet (20 mEq total) by mouth daily. Patient taking differently: Take 20 mEq by mouth 2 (two) times a week.  11/09/15   Cassandria Anger, MD     Family History  Problem Relation Age of Onset  . Prostate cancer Brother     dx. 53s  . Stroke Father   . Breast cancer Sister     dx. 64-65  . Dementia Brother   . Breast cancer Sister 48    mastectomy    . Leukemia Sister     dx. 61s-70s  . Colon cancer Neg Hx     Social History   Social History  . Marital status: Married    Spouse name: Curran Potenza  . Number of children: 3  . Years of education: N/A   Occupational History  . Retired     Company secretary   Social History Main Topics  . Smoking status: Former Research scientist (life sciences)  . Smokeless tobacco: Never Used  . Alcohol use No  . Drug use: No  . Sexual activity: No   Other Topics Concern  . None   Social History Narrative  . None    Review of Systems: A 12 point ROS discussed  Review of Systems  Constitutional: Positive for fatigue. Negative for activity change, appetite change, chills and fever.  HENT: Negative.   Respiratory: Negative for cough and shortness of breath.   Cardiovascular: Negative.   Gastrointestinal: Positive for abdominal distention and abdominal pain. Negative for nausea and vomiting.  Genitourinary: Negative.   Musculoskeletal: Negative.   Skin: Negative.   Neurological: Negative.   Hematological: Negative.   Psychiatric/Behavioral: Negative.     Vital Signs: BP 140/90   Pulse (!) 117   Temp 98 F (36.7 C)   Resp 20   Ht 5\' 5"  (1.651 m)   Wt 153 lb (69.4 kg)   SpO2 100%   BMI 25.46 kg/m   Physical Exam  Constitutional: She is oriented to person, place, and time. She appears well-developed and well-nourished.  HENT:  Head: Normocephalic and atraumatic.  Eyes: EOM are normal.  Neck: Normal range of motion.  Cardiovascular: Regular rhythm and normal heart sounds.   + tachycardia  Pulmonary/Chest: Effort normal and breath sounds normal. She has no wheezes.  Abdominal: Soft. She exhibits distension. There is no tenderness.  Musculoskeletal: Normal range of motion.  Neurological: She is alert and oriented to person, place, and time.  Skin: Skin is warm and dry.  Psychiatric: She has a normal mood and affect. Her behavior is normal. Judgment and thought content normal.  Vitals  reviewed.   Mallampati Score:  MD Evaluation Airway: WNL Heart: WNL Abdomen: Other (comments) Abdomen comments: Distended secondary to malignant ascites Chest/ Lungs: WNL ASA  Classification: 3 Mallampati/Airway Score: One  Imaging: Ct Abdomen Pelvis W Contrast  Result Date: 07/10/2016 CLINICAL DATA:  Uterine cancer.  Chemotherapy ongoing. EXAM: CT ABDOMEN AND PELVIS WITH CONTRAST TECHNIQUE: Multidetector CT imaging of the abdomen and pelvis was performed using the standard protocol following bolus administration of intravenous contrast. CONTRAST:  134mL ISOVUE-300 IOPAMIDOL (ISOVUE-300) INJECTION 61% COMPARISON:  03/31/2016 FINDINGS: Lower chest: Clear lung bases. Borderline cardiomegaly, without pericardial or pleural effusion. Small hiatal hernia. Hepatobiliary: Normal liver. The common duct is upper normal, including at 10 mm. No intrahepatic duct dilatation. No choledocholithiasis. Pancreas: Pancreatic atrophy, without pancreatic duct dilatation. Spleen: Subcentimeter low-density splenic lesion is unchanged and of doubtful significance. Adrenals/Urinary Tract: Normal adrenal glands. An upper  pole left renal 6 mm cyst. Normal right kidney, without hydronephrosis. Normal urinary bladder. Stomach/Bowel: Proximal gastric underdistention. Normal colon, appendix, and terminal ileum. Mid small bowel loops are mildly dilated, including at 3.3 cm on image 67/series 2. A transition is identified to decompressed bowel in the distal ileum of the pelvis, including on image 75/series 2. The proximal most small bowel loops are normal in caliber. Vascular/Lymphatic: Aortic and branch vessel atherosclerosis. No abdominopelvic adenopathy. Reproductive: Hysterectomy.  No adnexal mass. Other: Increase in moderate volume abdominal pelvic ascites. Re- demonstration of omental caking. This is subjectively increased, but difficult to quantify secondary to its diffuse relatively ill-defined morphology. Example omental  thickening in the right-side of the abdomen at 10 mm on image 44/series 2 versus on the order of 5 mm previously (when remeasured). Suspect progressive peritoneal disease in the porta hepatis, including the gastrohepatic ligament on image 22/series 2. Right pelvic index implant measures 11 mm on image 71/series 2 and is similar at 10 mm previously. Cul-de-sac thickening and nodularity including on image 76/ series 2 persists. This is site of the small bowel transition. Fat containing left inguinal hernia. Musculoskeletal: Disc bulge at L2-3. IMPRESSION: 1. Mildly progressive disease. Increased moderate abdominal pelvic ascites with increase in omental/peritoneal metastasis. 2. Low-grade partial small bowel obstruction secondary to peritoneal metastasis in the pelvic cul-de-sac. Transition in the distal ileum with mid small bowel mild dilatation. 3. Small hiatal hernia. 4.  Aortic atherosclerosis. Electronically Signed   By: Abigail Miyamoto M.D.   On: 07/10/2016 15:00   US Paracentesis  Result Date: 07/17/2016 INDICATION: History of endometrial cancer with ascites. Request is made for therapeutic paracentesis. EXAM: ULTRASOUND GUIDED THERAPEUTIC PARACENTESIS MEDICATIONS: 1% lidocaine. COMPLICATIONS: None immediate. PROCEDURE: Informed written consent was obtained from the patient after a discussion of the risks, benefits and alternatives to treatment. A timeout was performed prior to the initiation of the procedure. Initial ultrasound scanning demonstrates a moderate amount of ascites within the right lower abdominal quadrant. The right lower abdomen was prepped and draped in the usual sterile fashion. 1% lidocaine was used for local anesthesia. Following this, a 19 gauge, 7-cm, Yueh catheter was introduced. An ultrasound image was saved for documentation purposes. The paracentesis was performed. The catheter was removed and a dressing was applied. The patient tolerated the procedure well without immediate post  procedural complication. FINDINGS: A total of approximately 4.2 L of amber fluid was removed. IMPRESSION: Successful ultrasound-guided paracentesis yielding 4.2 liters of peritoneal fluid. Read by: Saverio Danker, PA-C Electronically Signed   By: Aletta Edouard M.D.   On: 07/17/2016 16:24   Dg Abd Acute W/chest  Result Date: 07/17/2016 CLINICAL DATA:  75 year old female with abdominal pain and vomiting for 1 week. Metastatic endometrial cancer with ascites. Subsequent encounter. EXAM: DG ABDOMEN ACUTE W/ 1V CHEST COMPARISON:  Restaging CT Abdomen and Pelvis 07/10/2016, and earlier. FINDINGS: Right chest porta cath in place. Mediastinal contours are stable and within normal limits. Stable lung volumes. Mild eventration of the right hemidiaphragm. The lungs are clear. No pneumothorax or pneumoperitoneum. No pulmonary nodule identified. Small bowel obstruction gas pattern, significantly worse than on the recent CT Abdomen and Pelvis. Dilated small bowel loops up to 5 cm. Paucity of large bowel gas. Mild gray hazy opacity throughout the abdomen in keeping with the ascites which was demonstrated recently. Stable cholecystectomy clips. No acute osseous abnormality identified. IMPRESSION: 1. Progressive and now moderate to severe small bowel obstruction since the recent restaging CT Abdomen and Pelvis on  07/10/2016. Distal small bowel involvement by peritoneal metastases on that exam. No free air. 2.  No acute cardiopulmonary abnormality. Electronically Signed   By: Genevie Ann M.D.   On: 07/17/2016 13:00    Labs:  CBC:  Recent Labs  07/17/16 1220 07/18/16 0508 07/24/16 0953 07/26/16 0757  WBC 5.9 4.7 7.4 8.9  HGB 12.4 11.1* 12.6 12.4  HCT 37.7 33.3* 38.9 35.8*  PLT 279 237 321 288    COAGS:  Recent Labs  04/05/16 1242  INR 1.00    BMP:  Recent Labs  09/27/15 1924 09/28/15 0527  07/03/16 1022 07/17/16 1220 07/18/16 0508 07/24/16 0953  NA 132* 130*  < > 140 132* 139 137  K 5.1 4.4  < >  3.6 3.6 3.9 3.5  CL 99* 101  --   --  99* 105  --   CO2 26 25  < > 26 24 29 24   GLUCOSE 133* 127*  < > 117 146* 105* 136  BUN 16 12  < > 11.7 15 13  22.7  CALCIUM 8.7* 7.9*  < > 9.3 9.0 8.6* 8.9  CREATININE 1.09* 1.01*  < > 0.8 1.03* 0.95 1.1  GFRNONAA 49* 54*  --   --  52* 58*  --   GFRAA 57* >60  --   --  >60 >60  --   < > = values in this interval not displayed.  LIVER FUNCTION TESTS:  Recent Labs  06/05/16 1102 07/03/16 1022 07/17/16 1220 07/18/16 0508  BILITOT 0.30 0.39 0.8 0.8  AST 27 35* 27 23  ALT 12 18 12* 10*  ALKPHOS 58 59 42 35*  PROT 7.9 7.3 7.2 6.1*  ALBUMIN 3.2* 3.1* 3.2* 2.8*    TUMOR MARKERS: No results for input(s): AFPTM, CEA, CA199, CHROMGRNA in the last 8760 hours.  Assessment and Plan:  History of metastatic endometrial cancer and anal cancer which has progressed despite treatment.  Recurrent malignant ascites.  Will proceed with image guided placement of tunneled catheter today by Dr. Barbie Banner.  Thank you for this interesting consult.  I greatly enjoyed meeting Sharon Holt and look forward to participating in their care.  A copy of this report was sent to the requesting provider on this date.  Electronically Signed: Murrell Redden PA-C 07/26/2016, 8:16 AM   I spent a total of  30 Minutes  in face to face in clinical consultation, greater than 50% of which was counseling/coordinating care for placement of tunneled peritoneal catheter.

## 2016-07-27 ENCOUNTER — Other Ambulatory Visit (HOSPITAL_COMMUNITY): Payer: Commercial Managed Care - HMO

## 2016-07-27 ENCOUNTER — Telehealth: Payer: Self-pay

## 2016-07-27 ENCOUNTER — Ambulatory Visit (HOSPITAL_COMMUNITY): Admission: RE | Admit: 2016-07-27 | Source: Ambulatory Visit

## 2016-07-27 NOTE — Telephone Encounter (Signed)
S/w Almyra Free RN at hospice about new peritoneal drainage catheter. Gave her draining instructions per Dr Mariana Kaufman attached message. Hospice will provide drainage kits.

## 2016-07-27 NOTE — Telephone Encounter (Signed)
-----   Message from Gordy Levan, MD sent at 07/27/2016  8:31 AM EDT ----- Labs seen and need follow up: Please check with Hospice to see if our office needs to coordinate drainage equipment for new peritoneal catheter, or hospice. Please give orders to drain 1-2 liters 2x weekly prn and to call if this is needed more often.  thanks

## 2016-08-01 NOTE — Progress Notes (Deleted)
Follow Up Note: Gyn-Onc  Sharon Holt 75 y.o. female  CC: Endometrial cancer progression Treatment counseling.  Assessment/Plan:  75 y.o.  with progression of Stage IA Grade 2 endometrial adenocarcinoma. She has received 9 cycles taxol/carbo with rise in CA 125 Chemotherapy changed to doxil with continued rise in CA 125. Discussed change in chemotherapy to temsilorimus +-metformin Follow-up in 2 months We will check MSI status for consideration of pembro in the future  HPI:  Sharon Holt is a 75 y.o. 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  She had a 2 month history of vaginal spotting and saw Dr. Garwin Brothers for her scheduled annual visit in May 2016. A Pap was performed which was positive for AGUS. She then underwent ultrasound evaluation, which revealed a thickened endometrial stripe at 8.8 mm with the uterus itself measured 8 x 5 x 6 cm. An endometrial biopsy was performed on 03/12/2015 which revealed high-grade adenocarcinoma with papillary features architecturally however the immunostains were not typical for serous adenocarcinoma.  On 04/20/2015, she underwent a Robotic total laparoscopic hysterectomy, bilateral salpingo oophorectomy, bilateral pelvic lymph node dissection, bilateral para-aortic lymph node dissection    Final pathology revealed:   1. Lymph nodes, regional resection, right periaortic - SIX BENIGN LYMPH NODES (0/6). 2. Lymph nodes, regional resection, left periaortic - FIVE BENIGN LYMPH NODE (0/5). 3. Lymph nodes, regional resection, right pelvic - SEVEN BENIGN LYMPH NODES (0/7) 4. Lymph nodes, regional resection, left pelvic - EIGHT BENIGN LYMPH NODES (0/8) 5. Uterus and cervix, bilateral tubes and ovaries - ENDOMETRIAL ADENOCARCINOMA INVADING THE SUPERFICIAL MYOMETRIUM. - MARGINS NOT INVOLVED. - RIGHT OVARY: MATURE CYSTIC TERATOMA. - CERVIX, RIGHT AND LEFT OVARIES AND RIGHT AND LEFT  FALLOPIAN TUBES FREE OF TUMOR.  We requested that her initial pathology from the biopsy be compared to the postop biopsy due to the descrepancy (grade 2 endometrioid tumor on hysterectomy, high-grade adenocarcinoma with architectural papillary features but immunohistochemicalstaining not typical for serous carcinoma on biopsy ). There was concordance between the reads. Because the larger volume of tumor (the hysterectomy specimen) showed no serous features, we treated this is a grade 2 endometrioid lesion, not serous.  She went on to receive vaginal brachytherapy for high/intermediate risk factors in the uterine pathology.  She was admitted to [facility] on 09/07/15 with abdominal distension and discomfort. CT abdomen and pelvis on 09/07/15 showed large volume ascites and an omental cake of tumor. Paracentesis confirmed metastatic adenocarcinoma.  Received cycle 9 taxol/carboplatin 03/07/2016.  Chemotherapy changed to single agent doxil. Chemotherapy changed to tensimlorimus 07/2016   Review of Systems  Constitutional: Reports poor appetite  Cardiovascular: No chest pain, shortness of breath, or edema.  Pulmonary: No cough or wheeze.  Gastrointestinal: No nausea, vomiting, or diarrhea. No bright red blood per rectum or change in bowel movement.  Genitourinary: No frequency, urgency, or dysuria. No vaginal bleeding or discharge.  Musculoskeletal: No myalgia or joint pain. Neurologic: No weakness, numbness, or change in gait.  Psychology: No depression, anxiety, or insomnia.  Social Hx:   Social History   Social History  . Marital status: Married    Spouse name: Vita Currin  . Number of children: 3  . Years of education: N/A   Occupational History  . Retired     Company secretary   Social History Main Topics  . Smoking status: Former Research scientist (life sciences)  . Smokeless tobacco: Never Used  . Alcohol use No  . Drug use:  No  . Sexual activity: No   Other Topics Concern  . Not on file   Social  History Narrative  . No narrative on file    Past Surgical Hx:  Past Surgical History:  Procedure Laterality Date  . BREAST LUMPECTOMY     right breast BENIGN  . CHOLECYSTECTOMY    . IR GENERIC HISTORICAL  07/26/2016   IR PERC TUN PERIT CATH WO PORT S&I Dartha Lodge 07/26/2016 Marybelle Killings, MD [facility]  . ROBOTIC ASSISTED TOTAL HYSTERECTOMY WITH BILATERAL SALPINGO OOPHERECTOMY Bilateral 04/20/2015   Procedure: ROBOTIC ASSISTED TOTAL HYSTERECTOMY WITH BILATERAL SALPINGO OOPHORECTOMY AND PELVIC AND PERIAORTIC LYMPHADECTOMY;  Surgeon: Janie Morning, MD;  Location: [facility];  Service: Gynecology;  Laterality: Bilateral;  . TONSILLECTOMY      Past Medical Hx:  Past Medical History:  Diagnosis Date  . Anemia, unspecified   . Anxiety state, unspecified   . Chronic hepatitis, unspecified    pt thinks type B  . Endometrial cancer ([place]) 04/2015   serous adenocarcinoma  . Endometrial cancer, FIGO stage IVB ([place]) 07/05/2016  . History of brachytherapy 06/01/15, 06/10/15, 06/17/15, 06/24/15, 07/01/15   vaginal cuff 27.5 gray  . Malignant neoplasm of anal canal ([place]) 15 yrs ago   treated with radiation   . Mitral valve disorders(424.0)   . Osteoporosis   . Other abnormal glucose   . Other and unspecified hyperlipidemia   . Transfusion history    "back in my 20's"  . Unspecified essential hypertension     Family Hx:  Family History  Problem Relation Age of Onset  . Prostate cancer Brother     dx. 6s  . Stroke Father   . Breast cancer Sister     dx. 64-65  . Dementia Brother   . Breast cancer Sister 53    mastectomy  . Leukemia Sister     dx. 68s-70s  . Colon cancer Neg Hx     Vitals:  There were no vitals taken for this visit.  Physical Exam:  General: Well developed, well nourished female in no acute distress. Alert and oriented x 3.  Cardiovascular: Regular rate and rhythm.  Lungs: Clear to auscultation bilaterally. No wheezes/crackles/rhonchi noted.  Skin: No rashes or lesions  present. Back: No CVA tenderness.  Gyn: Nl EGBUS, no pelvic masses or nodularity Rectal:  Good tone, no masses,  Abdomen: Abdomen soft, non-tender , obese. Active bowel sounds in all quadrants. Incisions clean, dry and in tact. Extremities: No bilateral cyanosis, edema, or clubbing.

## 2016-08-03 ENCOUNTER — Ambulatory Visit: Payer: Commercial Managed Care - HMO | Admitting: Gynecologic Oncology

## 2016-08-24 ENCOUNTER — Encounter: Payer: Self-pay | Admitting: Oncology

## 2016-08-24 ENCOUNTER — Telehealth: Payer: Self-pay

## 2016-08-24 NOTE — Telephone Encounter (Signed)
LVM on daughter Sharon Holt's cell and called pt and spoke with husband. FMLA papers are signed. Joe stated she would come by and pick them up. Placed at [employer].

## 2016-08-24 NOTE — Progress Notes (Signed)
Medical Oncology  FMLA forms filled out for daughter by MD. RN to let daughter know that these are completed.  Godfrey Pick, MD

## 2016-08-28 ENCOUNTER — Telehealth: Payer: Self-pay

## 2016-08-28 NOTE — Telephone Encounter (Signed)
Sharon Holt called to inform Dr. Marko Plume that Ms Sharon Holt passed away on 2016-08-27 at 1158 pm.

## 2016-08-31 ENCOUNTER — Encounter: Payer: Self-pay | Admitting: Oncology

## 2016-08-31 NOTE — Progress Notes (Signed)
Medical Oncology  Patient died under care of Hospice September 08, 2016. Sympathy letter written to husband. Other MDs to be notified by this entry, as patient, family and I very much appreciated their help with her care.  L.Livesay,MD

## 2016-09-15 DEATH — deceased

## 2016-09-23 ENCOUNTER — Other Ambulatory Visit: Payer: Self-pay | Admitting: Nurse Practitioner
